# Patient Record
Sex: Female | Born: 1970
Health system: Southern US, Community
[De-identification: ages and names within clinical notes are randomized; demographics above are authoritative.]

## PROBLEM LIST (undated history)

## (undated) DIAGNOSIS — E079 Disorder of thyroid, unspecified: Secondary | ICD-10-CM

## (undated) DIAGNOSIS — D649 Anemia, unspecified: Secondary | ICD-10-CM

## (undated) DIAGNOSIS — F32A Depression, unspecified: Secondary | ICD-10-CM

## (undated) DIAGNOSIS — G43909 Migraine, unspecified, not intractable, without status migrainosus: Secondary | ICD-10-CM

## (undated) DIAGNOSIS — R000 Tachycardia, unspecified: Secondary | ICD-10-CM

## (undated) DIAGNOSIS — I319 Disease of pericardium, unspecified: Secondary | ICD-10-CM

## (undated) DIAGNOSIS — T7840XA Allergy, unspecified, initial encounter: Secondary | ICD-10-CM

## (undated) DIAGNOSIS — R002 Palpitations: Secondary | ICD-10-CM

## (undated) DIAGNOSIS — F419 Anxiety disorder, unspecified: Secondary | ICD-10-CM

## (undated) DIAGNOSIS — M1990 Unspecified osteoarthritis, unspecified site: Secondary | ICD-10-CM

## (undated) DIAGNOSIS — R519 Headache, unspecified: Secondary | ICD-10-CM

## (undated) DIAGNOSIS — E785 Hyperlipidemia, unspecified: Secondary | ICD-10-CM

## (undated) DIAGNOSIS — K219 Gastro-esophageal reflux disease without esophagitis: Secondary | ICD-10-CM

## (undated) DIAGNOSIS — R569 Unspecified convulsions: Secondary | ICD-10-CM

## (undated) DIAGNOSIS — G459 Transient cerebral ischemic attack, unspecified: Secondary | ICD-10-CM

## (undated) DIAGNOSIS — M797 Fibromyalgia: Secondary | ICD-10-CM

## (undated) DIAGNOSIS — IMO0002 Reserved for concepts with insufficient information to code with codable children: Secondary | ICD-10-CM

## (undated) DIAGNOSIS — R51 Headache: Secondary | ICD-10-CM

## (undated) HISTORY — PX: CHOLECYSTECTOMY: SHX55

## (undated) HISTORY — DX: Disorder of thyroid, unspecified: E07.9

## (undated) HISTORY — DX: Allergy, unspecified, initial encounter: T78.40XA

## (undated) HISTORY — DX: Hyperlipidemia, unspecified: E78.5

## (undated) HISTORY — DX: Unspecified convulsions: R56.9

## (undated) HISTORY — DX: Anemia, unspecified: D64.9

## (undated) HISTORY — PX: CHOLECYSTECTOMY, LAPAROSCOPIC: SHX56

## (undated) HISTORY — DX: Transient cerebral ischemic attack, unspecified: G45.9

## (undated) HISTORY — PX: UPPER GASTROINTESTINAL ENDOSCOPY: SHX188

## (undated) HISTORY — DX: Tachycardia, unspecified: R00.0

## (undated) HISTORY — DX: Unspecified osteoarthritis, unspecified site: M19.90

## (undated) HISTORY — DX: Reserved for concepts with insufficient information to code with codable children: IMO0002

## (undated) HISTORY — DX: Headache, unspecified: R51.9

## (undated) HISTORY — DX: Disease of pericardium, unspecified: I31.9

## (undated) HISTORY — PX: COLONOSCOPY: SHX174

## (undated) HISTORY — DX: Migraine, unspecified, not intractable, without status migrainosus: G43.909

## (undated) HISTORY — DX: Gastro-esophageal reflux disease without esophagitis: K21.9

## (undated) HISTORY — PX: ABDOMINAL HYSTERECTOMY: SHX81

## (undated) HISTORY — DX: Depression, unspecified: F32.A

## (undated) HISTORY — DX: Headache: R51

---

## 2012-12-26 DIAGNOSIS — I6330 Cerebral infarction due to thrombosis of unspecified cerebral artery: Secondary | ICD-10-CM | POA: Insufficient documentation

## 2012-12-26 DIAGNOSIS — M48061 Spinal stenosis, lumbar region without neurogenic claudication: Secondary | ICD-10-CM | POA: Insufficient documentation

## 2012-12-26 DIAGNOSIS — M47812 Spondylosis without myelopathy or radiculopathy, cervical region: Secondary | ICD-10-CM | POA: Insufficient documentation

## 2014-10-18 DIAGNOSIS — G451 Carotid artery syndrome (hemispheric): Secondary | ICD-10-CM | POA: Insufficient documentation

## 2014-12-12 ENCOUNTER — Encounter (HOSPITAL_COMMUNITY): Payer: Self-pay | Admitting: *Deleted

## 2014-12-12 ENCOUNTER — Emergency Department (HOSPITAL_COMMUNITY)
Admission: EM | Admit: 2014-12-12 | Discharge: 2014-12-13 | Disposition: A | Discharge status: Home / Self Care | Payer: Medicaid Other | Attending: Emergency Medicine | Admitting: Emergency Medicine

## 2014-12-12 ENCOUNTER — Emergency Department (HOSPITAL_COMMUNITY): Payer: Medicaid Other

## 2014-12-12 DIAGNOSIS — R200 Anesthesia of skin: Secondary | ICD-10-CM

## 2014-12-12 DIAGNOSIS — G8929 Other chronic pain: Secondary | ICD-10-CM | POA: Diagnosis not present

## 2014-12-12 DIAGNOSIS — R4781 Slurred speech: Secondary | ICD-10-CM | POA: Insufficient documentation

## 2014-12-12 DIAGNOSIS — Z8673 Personal history of transient ischemic attack (TIA), and cerebral infarction without residual deficits: Secondary | ICD-10-CM | POA: Insufficient documentation

## 2014-12-12 DIAGNOSIS — Z79899 Other long term (current) drug therapy: Secondary | ICD-10-CM | POA: Diagnosis not present

## 2014-12-12 DIAGNOSIS — R0602 Shortness of breath: Secondary | ICD-10-CM | POA: Insufficient documentation

## 2014-12-12 DIAGNOSIS — R52 Pain, unspecified: Secondary | ICD-10-CM

## 2014-12-12 DIAGNOSIS — M791 Myalgia: Secondary | ICD-10-CM | POA: Diagnosis not present

## 2014-12-12 DIAGNOSIS — F419 Anxiety disorder, unspecified: Secondary | ICD-10-CM | POA: Insufficient documentation

## 2014-12-12 HISTORY — DX: Fibromyalgia: M79.7

## 2014-12-12 HISTORY — DX: Anxiety disorder, unspecified: F41.9

## 2014-12-12 HISTORY — DX: Palpitations: R00.2

## 2014-12-12 LAB — BASIC METABOLIC PANEL
Anion gap: 6 (ref 5–15)
BUN: 10 mg/dL (ref 6–20)
CO2: 24 mmol/L (ref 22–32)
Calcium: 8.8 mg/dL — ABNORMAL LOW (ref 8.9–10.3)
Chloride: 107 mmol/L (ref 101–111)
Creatinine, Ser: 1.02 mg/dL — ABNORMAL HIGH (ref 0.44–1.00)
GFR calc Af Amer: >60 mL/min (ref >60)
GFR calc non Af Amer: >60 mL/min (ref >60)
Glucose, Bld: 90 mg/dL (ref 65–99)
Potassium: 4 mmol/L (ref 3.5–5.1)
Sodium: 137 mmol/L (ref 135–145)

## 2014-12-12 LAB — CBC
HCT: 37.8 % (ref 36.0–46.0)
Hemoglobin: 12.4 g/dL (ref 12.0–15.0)
MCH: 29 pg (ref 26.0–34.0)
MCHC: 32.8 g/dL (ref 30.0–36.0)
MCV: 88.3 fL (ref 78.0–100.0)
Platelets: 184 10*3/uL (ref 150–400)
RBC: 4.28 MIL/uL (ref 3.87–5.11)
RDW: 13.4 % (ref 11.5–15.5)
WBC: 3.9 10*3/uL — ABNORMAL LOW (ref 4.0–10.5)

## 2014-12-12 LAB — I-STAT TROPONIN, ED: Troponin i, poc: 0 ng/mL (ref 0.00–0.08)

## 2014-12-12 LAB — RAPID URINE DRUG SCREEN, HOSP PERFORMED
Amphetamines: NOT DETECTED
Barbiturates: NOT DETECTED
Benzodiazepines: NOT DETECTED
Cocaine: NOT DETECTED
Opiates: POSITIVE — AB
Tetrahydrocannabinol: NOT DETECTED

## 2014-12-12 MED ORDER — LORAZEPAM 2 MG/ML IJ SOLN
1.0000 mg | Freq: Once | INTRAMUSCULAR | Status: AC
Start: 2014-12-12 — End: 2014-12-12
  Administered 2014-12-12: 1 mg via INTRAVENOUS
  Filled 2014-12-12: qty 1

## 2014-12-12 NOTE — ED Provider Notes (Signed)
CSN: [id number]     Arrival date & time 12/12/14  41 History   First MD Initiated Contact with Patient 12/12/14 1736     Chief Complaint  Patient presents with  . Numbness  . Generalized Body Aches     (Consider location/radiation/quality/duration/timing/severity/associated sxs/prior Treatment) HPI Comments: 44 year old female reports numbness today. She says she woke up and had some right-sided facial numbness. It's coming and going lasting for 15 minutes at a time. Will leave for up to an hour. No instigating factors. Now she reports it's better on the right but is having some intermittent left-sided facial numbness. In route to the hospital, she started having some stuttering speech. No aphasia.  Does have history of TIA 2 years ago, fibromyalgia. She has not taken her medications today.  Patient is a 44 y.o. female presenting with neurologic complaint. The history is provided by the patient.  Neurologic Problem This is a new problem. The current episode started 6 to 12 hours ago. The problem occurs constantly. The problem has not changed since onset.Associated symptoms include shortness of breath. Pertinent negatives include no chest pain and no abdominal pain. Nothing aggravates the symptoms. Nothing relieves the symptoms. She has tried nothing for the symptoms.    Past Medical History  Diagnosis Date  . Fibromyalgia   . Palpitations   . Anxiety    Past Surgical History  Procedure Laterality Date  . Abdominal hysterectomy     History reviewed. No pertinent family history. History  Substance Use Topics  . Smoking status: Never Smoker   . Smokeless tobacco: Never Used  . Alcohol Use: Yes   OB History    No data available     Review of Systems  Respiratory: Positive for shortness of breath.   Cardiovascular: Negative for chest pain.  Gastrointestinal: Negative for vomiting and abdominal pain.  All other systems reviewed and are negative.     Allergies   Codeine  Home Medications   Prior to Admission medications   Medication Sig Start Date End Date Taking? Authorizing Provider  ALPRAZolam Duanne Moron) 0.5 MG tablet Take 0.5 mg by mouth every 6 (six) hours as needed for anxiety.   Yes Historical Provider, MD  clopidogrel (PLAVIX) 75 MG tablet Take 75 mg by mouth at bedtime.   Yes Historical Provider, MD  Cranberry-Vitamin C-Probiotic (AZO CRANBERRY PO) Take 2 capsules by mouth daily.   Yes Historical Provider, MD  Cyanocobalamin (VITAMIN B-12 IJ) Inject 1,000 mcg as directed every 30 (thirty) days. Take on the first of every month   Yes Historical Provider, MD  ergocalciferol (VITAMIN D2) 50000 UNITS capsule Take 50,000 Units by mouth once a week. Take on Tuesdays and Fridays per patient   Yes Historical Provider, MD  FLUoxetine (PROZAC) 40 MG capsule Take 40 mg by mouth at bedtime.   Yes Historical Provider, MD  Homeopathic Products (AZO YEAST PLUS PO) Take 1-2 capsules by mouth daily.   Yes Historical Provider, MD  HYDROcodone-acetaminophen (NORCO/VICODIN) 5-325 MG per tablet Take 1 tablet by mouth every 6 (six) hours as needed for moderate pain.   Yes Historical Provider, MD  metoprolol (LOPRESSOR) 50 MG tablet Take 25 mg by mouth 2 (two) times daily.   Yes Historical Provider, MD  Multiple Vitamin (MULTIVITAMIN WITH MINERALS) TABS tablet Take 1 tablet by mouth daily.   Yes Historical Provider, MD  pregabalin (LYRICA) 200 MG capsule Take 200 mg by mouth 2 (two) times daily.   Yes Historical Provider, MD  promethazine (PHENERGAN)  25 MG tablet Take 25 mg by mouth every 6 (six) hours as needed for nausea or vomiting.   Yes Historical Provider, MD  sennosides-docusate sodium (SENOKOT-S) 8.6-50 MG tablet Take 12 tablets by mouth daily as needed for constipation.   Yes Historical Provider, MD  SUMAtriptan (IMITREX) 100 MG tablet Take 100 mg by mouth every 2 (two) hours as needed for migraine. May repeat in 2 hours if headache persists or recurs.   Yes  Historical Provider, MD  topiramate (TOPAMAX) 50 MG tablet Take 50 mg by mouth 2 (two) times daily.   Yes Historical Provider, MD  traMADol (ULTRAM) 50 MG tablet Take 50 mg by mouth 4 (four) times daily. Take every day per patient   Yes Historical Provider, MD  traZODone (DESYREL) 50 MG tablet Take 50-150 mg by mouth at bedtime as needed for sleep.   Yes Historical Provider, MD   BP 100/63 mmHg  Pulse 70  Temp(Src) 98 F (36.7 C) (Oral)  Resp 15  SpO2 100% Physical Exam  Constitutional: She is oriented to person, place, and time. She appears well-developed and well-nourished. No distress.  HENT:  Head: Normocephalic and atraumatic.  Mouth/Throat: Oropharynx is clear and moist.  Eyes: EOM are normal. Pupils are equal, round, and reactive to light.  Neck: Normal range of motion. Neck supple.  Cardiovascular: Normal rate and regular rhythm.  Exam reveals no friction rub.   No murmur heard. Pulmonary/Chest: Effort normal and breath sounds normal. No respiratory distress. She has no wheezes. She has no rales.  Abdominal: Soft. She exhibits no distension. There is no tenderness. There is no rebound.  Musculoskeletal: Normal range of motion. She exhibits no edema.  Neurological: She is alert and oriented to person, place, and time. No cranial nerve deficit or sensory deficit. She exhibits normal muscle tone. GCS eye subscore is 4. GCS verbal subscore is 5. GCS motor subscore is 6.  No aphasia Has mild L facial weakness, no droop. When trying to smile, only R side of face twitches. Mild altered light touch to L face  Skin: Skin is warm. No rash noted. She is not diaphoretic.  Nursing note and vitals reviewed.   ED Course  Procedures (including critical care time) Labs Review Labs Reviewed  CBC - Abnormal; Notable for the following:    WBC 3.9 (*)    All other components within normal limits  BASIC METABOLIC PANEL - Abnormal; Notable for the following:    Creatinine, Ser 1.02 (*)     Calcium 8.8 (*)    All other components within normal limits  URINE RAPID DRUG SCREEN (HOSP PERFORMED) - Abnormal; Notable for the following:    Opiates POSITIVE (*)    All other components within normal limits  I-STAT TROPOININ, ED    Imaging Review Ct Head Wo Contrast  12/12/2014   CLINICAL DATA:  Acute onset of facial numbness and generalized body pain. Initial encounter.  EXAM: CT HEAD WITHOUT CONTRAST  TECHNIQUE: Contiguous axial images were obtained from the base of the skull through the vertex without intravenous contrast.  COMPARISON:  None.  FINDINGS: There is no evidence of acute infarction, mass lesion, or intra- or extra-axial hemorrhage on CT.  The posterior fossa, including the cerebellum, brainstem and fourth ventricle, is within normal limits. The third and lateral ventricles, and basal ganglia are unremarkable in appearance. The cerebral hemispheres are symmetric in appearance, with normal gray-white differentiation. No mass effect or midline shift is seen.  There is no evidence  of fracture; visualized osseous structures are unremarkable in appearance. The orbits are within normal limits. The paranasal sinuses and mastoid air cells are well-aerated. No significant soft tissue abnormalities are seen.  IMPRESSION: Unremarkable noncontrast CT of the head.   Electronically Signed   By: Garald Balding M.D.   On: 12/12/2014 18:22   Mr Brain Wo Contrast  12/12/2014   CLINICAL DATA:  Facial numbness beginning at 10 this morning. Diffuse body pain. History of fibromyalgia CF.  EXAM: MRI HEAD WITHOUT CONTRAST  TECHNIQUE: Multiplanar, multiecho pulse sequences of the brain and surrounding structures were obtained without intravenous contrast.  COMPARISON:  CT of the head Dec 12, 2014 at 1815 hours  FINDINGS: The ventricles and sulci are normal for patient's age. No abnormal parenchymal signal, mass lesions, mass effect. No reduced diffusion to suggest acute ischemia nor hyperacute demyelination.  No susceptibility artifact to suggest hemorrhage. A few scattered subcentimeter supratentorial and pontine white matter FLAIR T2 hyperintensities, mildly advanced for age. Remote bilateral small cerebellar infarcts.  No abnormal extra-axial fluid collections. No extra-axial masses though, contrast enhanced sequences would be more sensitive. Diminutive vertebral artery and basilar artery flow void ; bilateral posterior communicating artery flow voids noted.  Ocular globes and orbital contents are unremarkable though not tailored for evaluation. No abnormal sellar expansion. Visualized paranasal sinuses and mastoid air cells are well-aerated. No suspicious calvarial bone marrow signal. No abnormal sellar expansion. Craniocervical junction maintained.  IMPRESSION: No acute intracranial process.  Multiple small LEFT greater the RIGHT old cerebellar infarcts. Diminutive posterior circulation, likely on congenital basis. This could be further characterized on MR versus CT angiography.  Mild white matter changes may reflect chronic small vessel ischemic disease, advanced for age.   Electronically Signed   By: Elon Alas   On: 12/12/2014 23:39     EKG Interpretation   Date/Time:  Sunday Dec 12 2014 18:02:13 EDT Ventricular Rate:  63 PR Interval:  129 QRS Duration: 70 QT Interval:  403 QTC Calculation: 412 R Axis:   26 Text Interpretation:  Sinus rhythm Abnormal R-wave progression, early  transition No prior for comparison Confirmed by Mingo Amber  MD, Kamelia Lampkins ([id number])  on 12/12/2014 6:18:39 PM      MDM   Final diagnoses:  Facial numbness  Body aches    44 year old female here with facial numbness and weakness. She also reports diffuse pain throughout her entire body. Facial numbness has been right and left-sided, intermittent. Initially began as right-sided and is now left-sided. She has history of fibromyalgia that causes chronic body pain. Here vitals are stable. She did report this began when she  woke up and has had intermittent symptoms and symptoms that are patchy and changing signs. I do not feel she meets code stroke criteria. On exam she has lot altered light touch the left side of her face and when smiling she doesn't move the left side of her face. There is no baseline facial droop. Will plan on CT and MRI of her head. Labs are unremarkable. EKG is unremarkable. MRI is normal. Patient's feeling better. Instructed to follow-up with PCP and follow-up with her rheumatologist.    Evelina Bucy, MD 12/13/14 [phone]

## 2014-12-12 NOTE — ED Notes (Signed)
MD Mingo Amber at the bedside and made aware of the patient's exam.

## 2014-12-12 NOTE — ED Notes (Addendum)
Pt brought in by family for c/o facial numbness starting at 1000 this morning. Pt very tearful in triage, states her entire body hurts, no facial droop noted, pt able to get out of wheelchair into triage chair unassisted. Pt presents with a mild stutter per the family is new. Stutter started upon entering the ED at check in. Pt has bilateral grips, no weakness noted.

## 2014-12-12 NOTE — ED Notes (Signed)
Phlebotomy at the bedside  

## 2014-12-12 NOTE — ED Notes (Signed)
Patient transported to MRI 

## 2014-12-12 NOTE — ED Notes (Signed)
MD Walden at the bedside. 

## 2014-12-13 NOTE — Discharge Instructions (Signed)
Paresthesia Paresthesia is an abnormal burning or prickling sensation. This sensation is generally felt in the hands, arms, legs, or feet. However, it may occur in any part of the body. It is usually not painful. The feeling may be described as:  Tingling or numbness.  "Pins and needles."  Skin crawling.  Buzzing.  Limbs "falling asleep."  Itching. Most people experience temporary (transient) paresthesia at some time in their lives. CAUSES  Paresthesia may occur when you breathe too quickly (hyperventilation). It can also occur without any apparent cause. Commonly, paresthesia occurs when pressure is placed on a nerve. The feeling quickly goes away once the pressure is removed. For some people, however, paresthesia is a long-lasting (chronic) condition caused by an underlying disorder. The underlying disorder may be:  A traumatic, direct injury to nerves. Examples include a:  Broken (fractured) neck.  Fractured skull.  A disorder affecting the brain and spinal cord (central nervous system). Examples include:  Transverse myelitis.  Encephalitis.  Transient ischemic attack.  Multiple sclerosis.  Stroke.  Tumor or blood vessel problems, such as an arteriovenous malformation pressing against the brain or spinal cord.  A condition that damages the peripheral nerves (peripheral neuropathy). Peripheral nerves are not part of the brain and spinal cord. These conditions include:  Diabetes.  Peripheral vascular disease.  Nerve entrapment syndromes, such as carpal tunnel syndrome.  Shingles.  Hypothyroidism.  Vitamin B12 deficiencies.  Alcoholism.  Heavy metal poisoning (lead, arsenic).  Rheumatoid arthritis.  Systemic lupus erythematosus. DIAGNOSIS  Your caregiver will attempt to find the underlying cause of your paresthesia. Your caregiver may:  Take your medical history.  Perform a physical exam.  Order various lab tests.  Order imaging tests. TREATMENT    Treatment for paresthesia depends on the underlying cause. HOME CARE INSTRUCTIONS  Avoid drinking alcohol.  You may consider massage or acupuncture to help relieve your symptoms.  Keep all follow-up appointments as directed by your caregiver. SEEK IMMEDIATE MEDICAL CARE IF:   You feel weak.  You have trouble walking or moving.  You have problems with speech or vision.  You feel confused.  You cannot control your bladder or bowel movements.  You feel numbness after an injury.  You faint.  Your burning or prickling feeling gets worse when walking.  You have pain, cramps, or dizziness.  You develop a rash. MAKE SURE YOU:  Understand these instructions.  Will watch your condition.  Will get help right away if you are not doing well or get worse. Document Released: 07/06/2002 Document Revised: 10/08/2011 Document Reviewed: 04/06/2011 [facility] Patient Information 2015 [place], [place]. This information is not intended to replace advice given to you by your health care provider. Make sure you discuss any questions you have with your health care provider.   Emergency Department Resource Guide 1) Find a Doctor and Pay Out of Pocket Although you won't have to find out who is covered by your insurance plan, it is a good idea to ask around and get recommendations. You will then need to call the office and see if the doctor you have chosen will accept you as a new patient and what types of options they offer for patients who are self-pay. Some doctors offer discounts or will set up payment plans for their patients who do not have insurance, but you will need to ask so you aren't surprised when you get to your appointment.  2) Contact Your Local Health Department Not all health departments have doctors that can see  patients for sick visits, but many do, so it is worth a call to see if yours does. If you don't know where your local health department is, you can check in your phone  book. The CDC also has a tool to help you locate your state's health department, and many state websites also have listings of all of their local health departments.  3) Find a [facility] If your illness is not likely to be very severe or complicated, you may want to try a walk in clinic. These are popping up all over the country in pharmacies, drugstores, and shopping centers. They're usually staffed by nurse practitioners or physician assistants that have been trained to treat common illnesses and complaints. They're usually fairly quick and inexpensive. However, if you have serious medical issues or chronic medical problems, these are probably not your best option.  No Primary Care Doctor: - Call Health Connect at  [phone] - they can help you locate a primary care doctor that  accepts your insurance, provides certain services, etc. - Physician Referral Service- [phone]  Chronic Pain Problems: Organization         Address  Phone   Notes  [facility]  [phone] Patients need to be referred by their primary care doctor.   Medication Assistance: Organization         Address  Phone   Notes  [facility] Medication [facility] [address] [place], [place] [address] [phone] --Must be a resident of [facility] -- Must have NO insurance coverage whatsoever (no Medicaid/ Medicare, etc.) -- The pt. MUST have a primary care doctor that directs their care regularly and follows them in the community   Med[facility]ist  [phone]   [employer]  [phone]    Agencies that provide inexpensive medical care: Organization         Address  Phone   Notes  [place]  [phone]   [facility]    [phone]   [facility] [place], [place] [address] [phone]   [place] [address], [place] [phone]   Planned  P[facility]thood    [phone]   [facility]    [phone]   [place] and [place] [address], [place] Phone:  [phone], Fax:  [phone] Hours of Operation:  9 am - 6 pm, M-F.  Also accepts Medicaid/Medicare and self-pay.  [facility] for [place] [address], [address], [place] Phone: [phone], Fax: [phone]. Hours of Operation:  8:30 am - 5:30 pm, M-F.  Also accepts Medicaid and self-pay.  [facility] [place] [address], [place] Phone: [phone]   [place], [place], [place] [phone], Ext. [phone] Mondays & Thursdays: 7-9 AM.  First 15 patients are seen on a first come, first serve basis.    [place] Providers:  Organization         Address  Phone   Notes  [facility] [address], [place] [phone] Also accepts self-pay patients.  [place], [place]  [phone]   [place], [address], [place] [phone]   [place] [address] [place] (  [place]) [place] [address], [place]   [phone] Only accepts [place] Access [place] patients after they have their name applied to their card.   Self-Pay (no insurance) in [facility]:  Organization         Address  Phone   Notes  Sickle Cell Patients, [facility] [place] [phone]   [facility] [place] [phone]   [facility] [place]  [place], [place], [place] [phone]   [facility]/Dr. Osei-Bonsu  [address] [place] or [address], [place] [phone] Phone number for both [place] and [place] locations is the same.   Urgent Medical and [facility] [address], [place] [phone]   [facility] [address], [place] or [address] [phone]   [facility] [address], [place] [phone], phone; [phone], fax Sees patients 1st and 3rd Saturday of every month.  Must not qualify for public or private insurance (i.e. Medicaid, Medicare, [place] Health Choice, Veterans' Benefits)  Household income should be no more than 200% of the poverty level The clinic cannot treat you if you are pregnant or think you are pregnant  Sexually transmitted diseases are not treated at the clinic.    Dental Care: Organization         Address  Phone  Notes  [facility] Department of [facility] [place] [phone] Accepts children up to age 8 who are enrolled in [place] or [place]; pregnant women with a Medicaid card; and children who have applied for Medicaid or [place] Health Choice, but were declined, whose parents can pay a reduced fee at time of service.  [facility] Department of [facility]  [address], [place] [phone] Accepts children up to age 37 who are enrolled in [place] or [place]; pregnant women with a Medicaid card; and children who have applied for Medicaid or  Health Choice, but were declined, whose parents can pay a reduced fee at time of service.  [place] Adult Dental Access PROGRAM  [place] [phone] Patients are seen by appointment only. Walk-ins are not accepted. [place] will see patients 52 years of age and older. Monday - Tuesday (8am-5pm) Most Wednesdays (8:30-5pm) $30 per visit, cash only  [facility] Adult Dental Access PROGRAM  [address], [facility] [phone] Patients are seen by appointment only. Walk-ins are not accepted. [place] will see patients 36  years of age and older. One Wednesday Evening (Monthly: Volunteer Based).  $30 per visit, cash only  [place]  [phone] for adults; Children under age 73, call Graduate Pediatric Dentistry at [phone]. Children aged 59-14, please call [phone] to request a pediatric application.  Dental services are provided in all areas of dental care including fillings, crowns and bridges, complete and partial dentures, implants, gum treatment, root canals, and extractions. Preventive care is also provided. Treatment is provided to both adults and children. Patients are selected via a lottery and there is often a waiting list.   [facility] [address], [place]  [phone] [email or web address], [place]  [place], [place] [phone], Ext. [phone] Second and Fourth Thursday of each month, opens at 6:30 AM; Clinic ends at 9 AM.  Patients are seen on a first-come first-served basis, and a limited number are seen during each clinic.   [facility]  [address] Hillard Danker [place], [place] [phone]   Eligibility Requirements You must have lived in [place], [place], or [place] counties for at least the last three months.   You cannot be eligible for state or federal sponsored [employer], including [employer], [place], or [employer].   You generally cannot be eligible for healthcare insurance through your employer.    How to apply: Eligibility screenings are held every Tuesday and Wednesday afternoon from 1:00 pm until 4:00 pm. You do not need an appointment for the interview!  [facility] [address] [place], [place]   [place]  [place] Department  [place]  [phone]    Behavioral Health Resources in the Community: Intensive Outpatient  Programs Organization         Address  Phone  Notes  Sekiu Archer. [address], [place], [place] [phone]   [facility] [address] [place], [place]   ADS: Alcohol & Drug Svcs [address], [place], [place]   [place] [address],  [place], [place] or [phone]   Substance Abuse Resources Organization         Address  Phone  Notes  Alcohol and Drug Services  [phone]   [place]  [phone]   The [place]   Chinita Pester  [phone]   Residential & Outpatient Substance Abuse Program  [phone]   Psychological Services Organization         Address  Phone  Notes  [facility]  [place]  [phone]   [place] [address], [place] or [phone]    Mobile Crisis Teams Organization         Address  Phone  Notes  [facility], Mobile Crisis Care Unit  [phone]   [facility]  [address] [place], [place]   [place] [address], [place] [phone]    Self-Help/Support Groups Organization         Address  Phone             Notes  [place] of [place] - variety of support groups  [place] Call for more information  Narcotics Anonymous (NA), [facility] [address], [employer] [place]  2 meetings at this location   Special educational needs teacher         Address  Phone  Notes  [facility] Residential Treatment [place],    [place]  [phone]   [facility] [employer]  [address] [place] [address], [place], [place]   [place], [place] [phone] Admissions: 8am-3pm M-F  Incentives Substance [place] [address]    [place], [place]  [phone]   [facility] [address] Jadene Pierini [place], [place]   The [place].,  [place], [place] - Intensive Outpatient [address] Kristeen Mans [place], [place], [place]  [facility] (Addiction Recovery Care Assoc.) [place].,  [place], [place] [phone] or [phone]   [facility]idential Treatment Services (RTS) [address], [place], [place] Accepts Medicaid  [place] [address],  [place] [phone] Substance Abuse/Addiction Treatment   [facility] Organization         Address  Phone  Notes  [facility]  [phone]   Domenic Schwab, PhD [address] Arlis Porta [place], [place]   [phone] or [phone]   [place], [place] [phone]   [place] [address], [place], [place] [phone] Insurance/Medicaid/sponsorship through [facility] and Families [address], [address]                                    [place], [place] [phone] [place] [address], [place] [phone]    Dr. Adele Schilder  [phone]   [facility] Dept. 1) [address], [place] 2) [place] 3)  [address], [place] [phone]  [phone]   [place] [phone] or [phone] (After Hours)

## 2014-12-13 NOTE — ED Notes (Signed)
Pt. Left with all belongings 

## 2014-12-25 NOTE — ED Notes (Signed)
Wasted Ativan 1mg /.57ml with Nicolette Bang RN.

## 2015-11-10 DIAGNOSIS — L299 Pruritus, unspecified: Secondary | ICD-10-CM | POA: Diagnosis not present

## 2015-11-10 DIAGNOSIS — R202 Paresthesia of skin: Secondary | ICD-10-CM | POA: Diagnosis not present

## 2015-11-18 DIAGNOSIS — R5381 Other malaise: Secondary | ICD-10-CM | POA: Diagnosis not present

## 2015-11-28 DIAGNOSIS — M797 Fibromyalgia: Secondary | ICD-10-CM | POA: Insufficient documentation

## 2015-11-28 DIAGNOSIS — L299 Pruritus, unspecified: Secondary | ICD-10-CM | POA: Diagnosis not present

## 2015-12-15 DIAGNOSIS — L299 Pruritus, unspecified: Secondary | ICD-10-CM | POA: Diagnosis not present

## 2015-12-20 DIAGNOSIS — Z79899 Other long term (current) drug therapy: Secondary | ICD-10-CM | POA: Diagnosis not present

## 2015-12-20 DIAGNOSIS — R0789 Other chest pain: Secondary | ICD-10-CM | POA: Diagnosis not present

## 2015-12-20 DIAGNOSIS — R079 Chest pain, unspecified: Secondary | ICD-10-CM | POA: Diagnosis not present

## 2015-12-20 DIAGNOSIS — F329 Major depressive disorder, single episode, unspecified: Secondary | ICD-10-CM | POA: Diagnosis not present

## 2015-12-20 DIAGNOSIS — G43909 Migraine, unspecified, not intractable, without status migrainosus: Secondary | ICD-10-CM | POA: Diagnosis not present

## 2015-12-20 DIAGNOSIS — E039 Hypothyroidism, unspecified: Secondary | ICD-10-CM | POA: Diagnosis not present

## 2015-12-20 DIAGNOSIS — R4182 Altered mental status, unspecified: Secondary | ICD-10-CM | POA: Diagnosis not present

## 2015-12-20 DIAGNOSIS — F419 Anxiety disorder, unspecified: Secondary | ICD-10-CM | POA: Diagnosis not present

## 2015-12-20 DIAGNOSIS — R072 Precordial pain: Secondary | ICD-10-CM | POA: Diagnosis not present

## 2015-12-20 DIAGNOSIS — K219 Gastro-esophageal reflux disease without esophagitis: Secondary | ICD-10-CM | POA: Diagnosis not present

## 2015-12-20 DIAGNOSIS — R0602 Shortness of breath: Secondary | ICD-10-CM | POA: Diagnosis not present

## 2015-12-21 DIAGNOSIS — I6330 Cerebral infarction due to thrombosis of unspecified cerebral artery: Secondary | ICD-10-CM | POA: Diagnosis not present

## 2015-12-21 DIAGNOSIS — R51 Headache: Secondary | ICD-10-CM | POA: Diagnosis not present

## 2015-12-21 DIAGNOSIS — R4701 Aphasia: Secondary | ICD-10-CM | POA: Diagnosis not present

## 2015-12-22 DIAGNOSIS — R42 Dizziness and giddiness: Secondary | ICD-10-CM | POA: Diagnosis not present

## 2015-12-22 DIAGNOSIS — R251 Tremor, unspecified: Secondary | ICD-10-CM | POA: Diagnosis not present

## 2015-12-22 DIAGNOSIS — Z79899 Other long term (current) drug therapy: Secondary | ICD-10-CM | POA: Diagnosis not present

## 2015-12-22 DIAGNOSIS — Z90710 Acquired absence of both cervix and uterus: Secondary | ICD-10-CM | POA: Diagnosis not present

## 2015-12-22 DIAGNOSIS — R253 Fasciculation: Secondary | ICD-10-CM | POA: Diagnosis not present

## 2015-12-22 DIAGNOSIS — R569 Unspecified convulsions: Secondary | ICD-10-CM | POA: Diagnosis not present

## 2015-12-22 DIAGNOSIS — Z7902 Long term (current) use of antithrombotics/antiplatelets: Secondary | ICD-10-CM | POA: Diagnosis not present

## 2015-12-22 DIAGNOSIS — R4182 Altered mental status, unspecified: Secondary | ICD-10-CM | POA: Diagnosis not present

## 2015-12-22 DIAGNOSIS — R531 Weakness: Secondary | ICD-10-CM | POA: Diagnosis not present

## 2015-12-22 DIAGNOSIS — Z885 Allergy status to narcotic agent status: Secondary | ICD-10-CM | POA: Diagnosis not present

## 2015-12-22 DIAGNOSIS — R51 Headache: Secondary | ICD-10-CM | POA: Diagnosis not present

## 2015-12-22 DIAGNOSIS — K589 Irritable bowel syndrome without diarrhea: Secondary | ICD-10-CM | POA: Diagnosis not present

## 2015-12-22 DIAGNOSIS — R258 Other abnormal involuntary movements: Secondary | ICD-10-CM | POA: Diagnosis not present

## 2015-12-22 DIAGNOSIS — Z9049 Acquired absence of other specified parts of digestive tract: Secondary | ICD-10-CM | POA: Diagnosis not present

## 2015-12-22 DIAGNOSIS — R404 Transient alteration of awareness: Secondary | ICD-10-CM | POA: Diagnosis not present

## 2015-12-22 DIAGNOSIS — F329 Major depressive disorder, single episode, unspecified: Secondary | ICD-10-CM | POA: Diagnosis not present

## 2015-12-28 DIAGNOSIS — F419 Anxiety disorder, unspecified: Secondary | ICD-10-CM | POA: Insufficient documentation

## 2015-12-28 DIAGNOSIS — G4701 Insomnia due to medical condition: Secondary | ICD-10-CM | POA: Insufficient documentation

## 2015-12-28 DIAGNOSIS — R251 Tremor, unspecified: Secondary | ICD-10-CM | POA: Insufficient documentation

## 2015-12-28 DIAGNOSIS — M791 Myalgia: Secondary | ICD-10-CM | POA: Diagnosis not present

## 2015-12-28 DIAGNOSIS — G459 Transient cerebral ischemic attack, unspecified: Secondary | ICD-10-CM | POA: Insufficient documentation

## 2015-12-28 DIAGNOSIS — F411 Generalized anxiety disorder: Secondary | ICD-10-CM | POA: Insufficient documentation

## 2015-12-30 DIAGNOSIS — L700 Acne vulgaris: Secondary | ICD-10-CM | POA: Diagnosis not present

## 2015-12-30 DIAGNOSIS — L503 Dermatographic urticaria: Secondary | ICD-10-CM | POA: Diagnosis not present

## 2016-01-16 DIAGNOSIS — L500 Allergic urticaria: Secondary | ICD-10-CM | POA: Diagnosis not present

## 2016-01-16 DIAGNOSIS — L503 Dermatographic urticaria: Secondary | ICD-10-CM | POA: Diagnosis not present

## 2016-01-26 DIAGNOSIS — M797 Fibromyalgia: Secondary | ICD-10-CM | POA: Diagnosis not present

## 2016-01-26 DIAGNOSIS — F419 Anxiety disorder, unspecified: Secondary | ICD-10-CM | POA: Diagnosis not present

## 2016-01-26 DIAGNOSIS — R946 Abnormal results of thyroid function studies: Secondary | ICD-10-CM | POA: Diagnosis not present

## 2016-01-26 DIAGNOSIS — R0602 Shortness of breath: Secondary | ICD-10-CM | POA: Diagnosis not present

## 2016-02-02 DIAGNOSIS — J9811 Atelectasis: Secondary | ICD-10-CM | POA: Diagnosis not present

## 2016-02-02 DIAGNOSIS — R079 Chest pain, unspecified: Secondary | ICD-10-CM | POA: Diagnosis not present

## 2016-02-09 DIAGNOSIS — L299 Pruritus, unspecified: Secondary | ICD-10-CM | POA: Diagnosis not present

## 2016-02-09 DIAGNOSIS — R21 Rash and other nonspecific skin eruption: Secondary | ICD-10-CM | POA: Diagnosis not present

## 2016-02-09 DIAGNOSIS — M797 Fibromyalgia: Secondary | ICD-10-CM | POA: Diagnosis not present

## 2016-02-09 DIAGNOSIS — F419 Anxiety disorder, unspecified: Secondary | ICD-10-CM | POA: Diagnosis not present

## 2016-02-27 DIAGNOSIS — F419 Anxiety disorder, unspecified: Secondary | ICD-10-CM | POA: Diagnosis not present

## 2016-02-27 DIAGNOSIS — E559 Vitamin D deficiency, unspecified: Secondary | ICD-10-CM | POA: Insufficient documentation

## 2016-02-27 DIAGNOSIS — R29898 Other symptoms and signs involving the musculoskeletal system: Secondary | ICD-10-CM | POA: Diagnosis not present

## 2016-02-27 DIAGNOSIS — M797 Fibromyalgia: Secondary | ICD-10-CM | POA: Diagnosis not present

## 2016-02-27 DIAGNOSIS — M47812 Spondylosis without myelopathy or radiculopathy, cervical region: Secondary | ICD-10-CM | POA: Diagnosis not present

## 2016-02-27 DIAGNOSIS — M542 Cervicalgia: Secondary | ICD-10-CM | POA: Diagnosis not present

## 2016-02-27 DIAGNOSIS — E538 Deficiency of other specified B group vitamins: Secondary | ICD-10-CM | POA: Diagnosis not present

## 2016-03-17 DIAGNOSIS — M797 Fibromyalgia: Secondary | ICD-10-CM | POA: Diagnosis not present

## 2016-03-17 DIAGNOSIS — M2550 Pain in unspecified joint: Secondary | ICD-10-CM | POA: Diagnosis not present

## 2016-03-26 DIAGNOSIS — M609 Myositis, unspecified: Secondary | ICD-10-CM | POA: Diagnosis not present

## 2016-03-26 DIAGNOSIS — R5383 Other fatigue: Secondary | ICD-10-CM | POA: Diagnosis not present

## 2016-03-26 DIAGNOSIS — R0683 Snoring: Secondary | ICD-10-CM | POA: Diagnosis not present

## 2016-03-26 DIAGNOSIS — Z6836 Body mass index (BMI) 36.0-36.9, adult: Secondary | ICD-10-CM | POA: Diagnosis not present

## 2016-05-16 DIAGNOSIS — R1031 Right lower quadrant pain: Secondary | ICD-10-CM | POA: Diagnosis not present

## 2016-05-17 DIAGNOSIS — R1031 Right lower quadrant pain: Secondary | ICD-10-CM | POA: Diagnosis not present

## 2016-06-08 DIAGNOSIS — F450 Somatization disorder: Secondary | ICD-10-CM | POA: Insufficient documentation

## 2016-06-08 DIAGNOSIS — F445 Conversion disorder with seizures or convulsions: Secondary | ICD-10-CM | POA: Insufficient documentation

## 2016-06-08 DIAGNOSIS — M47812 Spondylosis without myelopathy or radiculopathy, cervical region: Secondary | ICD-10-CM | POA: Diagnosis not present

## 2016-06-08 DIAGNOSIS — Z8673 Personal history of transient ischemic attack (TIA), and cerebral infarction without residual deficits: Secondary | ICD-10-CM | POA: Diagnosis not present

## 2016-06-08 DIAGNOSIS — G4701 Insomnia due to medical condition: Secondary | ICD-10-CM | POA: Diagnosis not present

## 2016-06-08 DIAGNOSIS — R51 Headache: Secondary | ICD-10-CM | POA: Diagnosis not present

## 2016-06-08 DIAGNOSIS — R000 Tachycardia, unspecified: Secondary | ICD-10-CM | POA: Diagnosis not present

## 2016-06-08 DIAGNOSIS — Z885 Allergy status to narcotic agent status: Secondary | ICD-10-CM | POA: Diagnosis not present

## 2016-06-08 DIAGNOSIS — F419 Anxiety disorder, unspecified: Secondary | ICD-10-CM | POA: Diagnosis not present

## 2016-06-08 DIAGNOSIS — R569 Unspecified convulsions: Secondary | ICD-10-CM | POA: Diagnosis not present

## 2016-06-08 DIAGNOSIS — F329 Major depressive disorder, single episode, unspecified: Secondary | ICD-10-CM | POA: Diagnosis not present

## 2016-06-08 DIAGNOSIS — G43809 Other migraine, not intractable, without status migrainosus: Secondary | ICD-10-CM | POA: Diagnosis not present

## 2016-06-08 DIAGNOSIS — M48061 Spinal stenosis, lumbar region without neurogenic claudication: Secondary | ICD-10-CM | POA: Diagnosis not present

## 2016-06-08 DIAGNOSIS — F332 Major depressive disorder, recurrent severe without psychotic features: Secondary | ICD-10-CM | POA: Diagnosis not present

## 2016-06-08 DIAGNOSIS — I6330 Cerebral infarction due to thrombosis of unspecified cerebral artery: Secondary | ICD-10-CM | POA: Diagnosis not present

## 2016-06-08 DIAGNOSIS — G451 Carotid artery syndrome (hemispheric): Secondary | ICD-10-CM | POA: Diagnosis not present

## 2016-06-08 DIAGNOSIS — M791 Myalgia: Secondary | ICD-10-CM | POA: Diagnosis not present

## 2016-06-11 DIAGNOSIS — R42 Dizziness and giddiness: Secondary | ICD-10-CM | POA: Diagnosis not present

## 2016-06-13 DIAGNOSIS — F329 Major depressive disorder, single episode, unspecified: Secondary | ICD-10-CM | POA: Insufficient documentation

## 2016-06-13 DIAGNOSIS — R251 Tremor, unspecified: Secondary | ICD-10-CM | POA: Diagnosis not present

## 2016-06-13 DIAGNOSIS — F450 Somatization disorder: Secondary | ICD-10-CM | POA: Diagnosis not present

## 2016-06-13 DIAGNOSIS — F419 Anxiety disorder, unspecified: Secondary | ICD-10-CM | POA: Diagnosis not present

## 2016-06-13 DIAGNOSIS — F32A Depression, unspecified: Secondary | ICD-10-CM | POA: Insufficient documentation

## 2016-06-13 DIAGNOSIS — G43909 Migraine, unspecified, not intractable, without status migrainosus: Secondary | ICD-10-CM | POA: Insufficient documentation

## 2016-06-16 DIAGNOSIS — R251 Tremor, unspecified: Secondary | ICD-10-CM | POA: Diagnosis not present

## 2016-06-16 DIAGNOSIS — R51 Headache: Secondary | ICD-10-CM | POA: Diagnosis not present

## 2016-06-18 DIAGNOSIS — R0789 Other chest pain: Secondary | ICD-10-CM | POA: Diagnosis not present

## 2016-06-18 DIAGNOSIS — R002 Palpitations: Secondary | ICD-10-CM | POA: Insufficient documentation

## 2016-06-18 DIAGNOSIS — R55 Syncope and collapse: Secondary | ICD-10-CM | POA: Insufficient documentation

## 2016-06-18 DIAGNOSIS — Z6834 Body mass index (BMI) 34.0-34.9, adult: Secondary | ICD-10-CM | POA: Diagnosis not present

## 2016-06-23 DIAGNOSIS — Z0100 Encounter for examination of eyes and vision without abnormal findings: Secondary | ICD-10-CM | POA: Diagnosis not present

## 2016-06-25 DIAGNOSIS — F339 Major depressive disorder, recurrent, unspecified: Secondary | ICD-10-CM | POA: Diagnosis not present

## 2016-06-25 DIAGNOSIS — R55 Syncope and collapse: Secondary | ICD-10-CM | POA: Diagnosis not present

## 2016-06-25 DIAGNOSIS — Z6835 Body mass index (BMI) 35.0-35.9, adult: Secondary | ICD-10-CM | POA: Diagnosis not present

## 2016-06-27 DIAGNOSIS — R251 Tremor, unspecified: Secondary | ICD-10-CM | POA: Diagnosis not present

## 2016-06-27 DIAGNOSIS — F450 Somatization disorder: Secondary | ICD-10-CM | POA: Diagnosis not present

## 2016-06-27 DIAGNOSIS — F445 Conversion disorder with seizures or convulsions: Secondary | ICD-10-CM | POA: Diagnosis not present

## 2016-06-27 DIAGNOSIS — F329 Major depressive disorder, single episode, unspecified: Secondary | ICD-10-CM | POA: Diagnosis not present

## 2016-07-04 DIAGNOSIS — R0789 Other chest pain: Secondary | ICD-10-CM | POA: Diagnosis not present

## 2016-07-04 DIAGNOSIS — R55 Syncope and collapse: Secondary | ICD-10-CM | POA: Diagnosis not present

## 2016-07-04 DIAGNOSIS — R002 Palpitations: Secondary | ICD-10-CM | POA: Diagnosis not present

## 2016-07-09 DIAGNOSIS — F329 Major depressive disorder, single episode, unspecified: Secondary | ICD-10-CM | POA: Diagnosis not present

## 2016-07-09 DIAGNOSIS — F419 Anxiety disorder, unspecified: Secondary | ICD-10-CM | POA: Diagnosis not present

## 2016-07-30 DIAGNOSIS — R300 Dysuria: Secondary | ICD-10-CM | POA: Diagnosis not present

## 2016-07-30 DIAGNOSIS — N3090 Cystitis, unspecified without hematuria: Secondary | ICD-10-CM | POA: Diagnosis not present

## 2016-07-31 DIAGNOSIS — R109 Unspecified abdominal pain: Secondary | ICD-10-CM | POA: Diagnosis not present

## 2016-07-31 DIAGNOSIS — Z6831 Body mass index (BMI) 31.0-31.9, adult: Secondary | ICD-10-CM | POA: Diagnosis not present

## 2016-07-31 DIAGNOSIS — B373 Candidiasis of vulva and vagina: Secondary | ICD-10-CM | POA: Diagnosis not present

## 2016-08-01 DIAGNOSIS — R1032 Left lower quadrant pain: Secondary | ICD-10-CM | POA: Diagnosis not present

## 2016-08-01 DIAGNOSIS — R1031 Right lower quadrant pain: Secondary | ICD-10-CM | POA: Diagnosis not present

## 2016-08-01 DIAGNOSIS — R1030 Lower abdominal pain, unspecified: Secondary | ICD-10-CM | POA: Diagnosis not present

## 2016-08-31 DIAGNOSIS — F339 Major depressive disorder, recurrent, unspecified: Secondary | ICD-10-CM | POA: Diagnosis not present

## 2016-08-31 DIAGNOSIS — F419 Anxiety disorder, unspecified: Secondary | ICD-10-CM | POA: Diagnosis not present

## 2016-09-13 DIAGNOSIS — Z1231 Encounter for screening mammogram for malignant neoplasm of breast: Secondary | ICD-10-CM | POA: Diagnosis not present

## 2016-09-13 DIAGNOSIS — Z1211 Encounter for screening for malignant neoplasm of colon: Secondary | ICD-10-CM | POA: Diagnosis not present

## 2016-09-13 DIAGNOSIS — Z0000 Encounter for general adult medical examination without abnormal findings: Secondary | ICD-10-CM | POA: Diagnosis not present

## 2016-09-13 DIAGNOSIS — Z6830 Body mass index (BMI) 30.0-30.9, adult: Secondary | ICD-10-CM | POA: Diagnosis not present

## 2016-09-13 DIAGNOSIS — F329 Major depressive disorder, single episode, unspecified: Secondary | ICD-10-CM | POA: Diagnosis not present

## 2016-09-27 DIAGNOSIS — Z1231 Encounter for screening mammogram for malignant neoplasm of breast: Secondary | ICD-10-CM | POA: Diagnosis not present

## 2016-10-04 DIAGNOSIS — M7061 Trochanteric bursitis, right hip: Secondary | ICD-10-CM | POA: Diagnosis not present

## 2016-10-17 DIAGNOSIS — Z201 Contact with and (suspected) exposure to tuberculosis: Secondary | ICD-10-CM | POA: Diagnosis not present

## 2016-10-17 DIAGNOSIS — Z6830 Body mass index (BMI) 30.0-30.9, adult: Secondary | ICD-10-CM | POA: Diagnosis not present

## 2016-10-17 DIAGNOSIS — R05 Cough: Secondary | ICD-10-CM | POA: Diagnosis not present

## 2016-10-17 DIAGNOSIS — J0110 Acute frontal sinusitis, unspecified: Secondary | ICD-10-CM | POA: Diagnosis not present

## 2016-11-03 DIAGNOSIS — M7061 Trochanteric bursitis, right hip: Secondary | ICD-10-CM | POA: Diagnosis not present

## 2016-11-15 DIAGNOSIS — G43909 Migraine, unspecified, not intractable, without status migrainosus: Secondary | ICD-10-CM | POA: Diagnosis not present

## 2016-11-15 DIAGNOSIS — Z6830 Body mass index (BMI) 30.0-30.9, adult: Secondary | ICD-10-CM | POA: Diagnosis not present

## 2016-11-23 DIAGNOSIS — F339 Major depressive disorder, recurrent, unspecified: Secondary | ICD-10-CM | POA: Diagnosis not present

## 2016-11-23 DIAGNOSIS — F419 Anxiety disorder, unspecified: Secondary | ICD-10-CM | POA: Diagnosis not present

## 2016-11-27 DIAGNOSIS — J209 Acute bronchitis, unspecified: Secondary | ICD-10-CM | POA: Diagnosis not present

## 2016-11-27 DIAGNOSIS — J040 Acute laryngitis: Secondary | ICD-10-CM | POA: Diagnosis not present

## 2016-11-27 DIAGNOSIS — J0100 Acute maxillary sinusitis, unspecified: Secondary | ICD-10-CM | POA: Diagnosis not present

## 2016-12-28 DIAGNOSIS — E538 Deficiency of other specified B group vitamins: Secondary | ICD-10-CM | POA: Diagnosis not present

## 2016-12-28 DIAGNOSIS — R350 Frequency of micturition: Secondary | ICD-10-CM | POA: Diagnosis not present

## 2016-12-28 DIAGNOSIS — L502 Urticaria due to cold and heat: Secondary | ICD-10-CM | POA: Diagnosis not present

## 2016-12-28 DIAGNOSIS — Z6830 Body mass index (BMI) 30.0-30.9, adult: Secondary | ICD-10-CM | POA: Diagnosis not present

## 2017-01-01 DIAGNOSIS — R102 Pelvic and perineal pain: Secondary | ICD-10-CM | POA: Diagnosis not present

## 2017-01-02 DIAGNOSIS — N390 Urinary tract infection, site not specified: Secondary | ICD-10-CM | POA: Diagnosis not present

## 2017-01-02 DIAGNOSIS — R109 Unspecified abdominal pain: Secondary | ICD-10-CM | POA: Diagnosis not present

## 2017-01-02 DIAGNOSIS — K5900 Constipation, unspecified: Secondary | ICD-10-CM | POA: Diagnosis not present

## 2017-01-02 DIAGNOSIS — R102 Pelvic and perineal pain: Secondary | ICD-10-CM | POA: Diagnosis not present

## 2017-01-02 DIAGNOSIS — R1031 Right lower quadrant pain: Secondary | ICD-10-CM | POA: Diagnosis not present

## 2017-01-07 DIAGNOSIS — F419 Anxiety disorder, unspecified: Secondary | ICD-10-CM | POA: Diagnosis not present

## 2017-01-07 DIAGNOSIS — F339 Major depressive disorder, recurrent, unspecified: Secondary | ICD-10-CM | POA: Diagnosis not present

## 2017-01-22 DIAGNOSIS — M25551 Pain in right hip: Secondary | ICD-10-CM | POA: Diagnosis not present

## 2017-01-22 DIAGNOSIS — Z139 Encounter for screening, unspecified: Secondary | ICD-10-CM | POA: Diagnosis not present

## 2017-01-22 DIAGNOSIS — Z6831 Body mass index (BMI) 31.0-31.9, adult: Secondary | ICD-10-CM | POA: Diagnosis not present

## 2017-01-22 DIAGNOSIS — M797 Fibromyalgia: Secondary | ICD-10-CM | POA: Diagnosis not present

## 2017-01-26 DIAGNOSIS — M5442 Lumbago with sciatica, left side: Secondary | ICD-10-CM | POA: Diagnosis not present

## 2017-01-26 DIAGNOSIS — M545 Low back pain: Secondary | ICD-10-CM | POA: Diagnosis not present

## 2017-01-26 DIAGNOSIS — M5441 Lumbago with sciatica, right side: Secondary | ICD-10-CM | POA: Diagnosis not present

## 2017-01-28 ENCOUNTER — Other Ambulatory Visit: Payer: Self-pay | Admitting: Orthopedic Surgery

## 2017-01-28 DIAGNOSIS — R52 Pain, unspecified: Secondary | ICD-10-CM

## 2017-01-31 DIAGNOSIS — M5416 Radiculopathy, lumbar region: Secondary | ICD-10-CM | POA: Diagnosis not present

## 2017-01-31 DIAGNOSIS — M5136 Other intervertebral disc degeneration, lumbar region: Secondary | ICD-10-CM | POA: Diagnosis not present

## 2017-01-31 DIAGNOSIS — R2689 Other abnormalities of gait and mobility: Secondary | ICD-10-CM | POA: Diagnosis not present

## 2017-01-31 DIAGNOSIS — M2560 Stiffness of unspecified joint, not elsewhere classified: Secondary | ICD-10-CM | POA: Diagnosis not present

## 2017-02-05 DIAGNOSIS — R2689 Other abnormalities of gait and mobility: Secondary | ICD-10-CM | POA: Diagnosis not present

## 2017-02-05 DIAGNOSIS — M2560 Stiffness of unspecified joint, not elsewhere classified: Secondary | ICD-10-CM | POA: Diagnosis not present

## 2017-02-05 DIAGNOSIS — M5416 Radiculopathy, lumbar region: Secondary | ICD-10-CM | POA: Diagnosis not present

## 2017-02-05 DIAGNOSIS — M5136 Other intervertebral disc degeneration, lumbar region: Secondary | ICD-10-CM | POA: Diagnosis not present

## 2017-02-07 DIAGNOSIS — M25551 Pain in right hip: Secondary | ICD-10-CM | POA: Diagnosis not present

## 2017-02-07 DIAGNOSIS — R350 Frequency of micturition: Secondary | ICD-10-CM | POA: Diagnosis not present

## 2017-02-07 DIAGNOSIS — Z6832 Body mass index (BMI) 32.0-32.9, adult: Secondary | ICD-10-CM | POA: Diagnosis not present

## 2017-02-08 DIAGNOSIS — M5136 Other intervertebral disc degeneration, lumbar region: Secondary | ICD-10-CM | POA: Diagnosis not present

## 2017-02-08 DIAGNOSIS — M2560 Stiffness of unspecified joint, not elsewhere classified: Secondary | ICD-10-CM | POA: Diagnosis not present

## 2017-02-08 DIAGNOSIS — R2689 Other abnormalities of gait and mobility: Secondary | ICD-10-CM | POA: Diagnosis not present

## 2017-02-08 DIAGNOSIS — M5416 Radiculopathy, lumbar region: Secondary | ICD-10-CM | POA: Diagnosis not present

## 2017-02-11 DIAGNOSIS — M5136 Other intervertebral disc degeneration, lumbar region: Secondary | ICD-10-CM | POA: Diagnosis not present

## 2017-02-11 DIAGNOSIS — M5416 Radiculopathy, lumbar region: Secondary | ICD-10-CM | POA: Diagnosis not present

## 2017-02-11 DIAGNOSIS — R2689 Other abnormalities of gait and mobility: Secondary | ICD-10-CM | POA: Diagnosis not present

## 2017-02-11 DIAGNOSIS — M2560 Stiffness of unspecified joint, not elsewhere classified: Secondary | ICD-10-CM | POA: Diagnosis not present

## 2017-02-12 ENCOUNTER — Ambulatory Visit
Admission: RE | Admit: 2017-02-12 | Discharge: 2017-02-12 | Disposition: A | Discharge status: Home / Self Care | Payer: BLUE CROSS/BLUE SHIELD | Source: Ambulatory Visit | Attending: Orthopedic Surgery | Admitting: Orthopedic Surgery

## 2017-02-12 DIAGNOSIS — M5126 Other intervertebral disc displacement, lumbar region: Secondary | ICD-10-CM | POA: Diagnosis not present

## 2017-02-12 DIAGNOSIS — R52 Pain, unspecified: Secondary | ICD-10-CM

## 2017-02-14 DIAGNOSIS — M5441 Lumbago with sciatica, right side: Secondary | ICD-10-CM | POA: Diagnosis not present

## 2017-02-14 DIAGNOSIS — M5442 Lumbago with sciatica, left side: Secondary | ICD-10-CM | POA: Diagnosis not present

## 2017-02-15 DIAGNOSIS — L659 Nonscarring hair loss, unspecified: Secondary | ICD-10-CM | POA: Diagnosis not present

## 2017-02-15 DIAGNOSIS — Z6831 Body mass index (BMI) 31.0-31.9, adult: Secondary | ICD-10-CM | POA: Diagnosis not present

## 2017-02-15 DIAGNOSIS — R350 Frequency of micturition: Secondary | ICD-10-CM | POA: Diagnosis not present

## 2017-02-15 DIAGNOSIS — M5416 Radiculopathy, lumbar region: Secondary | ICD-10-CM | POA: Diagnosis not present

## 2017-02-21 DIAGNOSIS — L659 Nonscarring hair loss, unspecified: Secondary | ICD-10-CM | POA: Diagnosis not present

## 2017-02-21 DIAGNOSIS — Z6831 Body mass index (BMI) 31.0-31.9, adult: Secondary | ICD-10-CM | POA: Diagnosis not present

## 2017-02-27 DIAGNOSIS — M545 Low back pain: Secondary | ICD-10-CM | POA: Diagnosis not present

## 2017-03-11 DIAGNOSIS — R350 Frequency of micturition: Secondary | ICD-10-CM | POA: Diagnosis not present

## 2017-03-11 DIAGNOSIS — R310 Gross hematuria: Secondary | ICD-10-CM | POA: Diagnosis not present

## 2017-03-11 DIAGNOSIS — N3020 Other chronic cystitis without hematuria: Secondary | ICD-10-CM | POA: Diagnosis not present

## 2017-03-11 DIAGNOSIS — R319 Hematuria, unspecified: Secondary | ICD-10-CM | POA: Diagnosis not present

## 2017-03-13 DIAGNOSIS — M79604 Pain in right leg: Secondary | ICD-10-CM | POA: Diagnosis not present

## 2017-03-20 DIAGNOSIS — S20369A Insect bite (nonvenomous) of unspecified front wall of thorax, initial encounter: Secondary | ICD-10-CM | POA: Diagnosis not present

## 2017-04-02 DIAGNOSIS — G43119 Migraine with aura, intractable, without status migrainosus: Secondary | ICD-10-CM | POA: Diagnosis not present

## 2017-04-05 DIAGNOSIS — F419 Anxiety disorder, unspecified: Secondary | ICD-10-CM | POA: Diagnosis not present

## 2017-04-05 DIAGNOSIS — F410 Panic disorder [episodic paroxysmal anxiety] without agoraphobia: Secondary | ICD-10-CM | POA: Diagnosis not present

## 2017-04-05 DIAGNOSIS — F339 Major depressive disorder, recurrent, unspecified: Secondary | ICD-10-CM | POA: Diagnosis not present

## 2017-04-23 DIAGNOSIS — E039 Hypothyroidism, unspecified: Secondary | ICD-10-CM | POA: Diagnosis not present

## 2017-04-30 DIAGNOSIS — H9209 Otalgia, unspecified ear: Secondary | ICD-10-CM | POA: Diagnosis not present

## 2017-04-30 DIAGNOSIS — J029 Acute pharyngitis, unspecified: Secondary | ICD-10-CM | POA: Diagnosis not present

## 2017-04-30 DIAGNOSIS — N390 Urinary tract infection, site not specified: Secondary | ICD-10-CM | POA: Diagnosis not present

## 2017-04-30 DIAGNOSIS — E039 Hypothyroidism, unspecified: Secondary | ICD-10-CM | POA: Diagnosis not present

## 2017-05-01 DIAGNOSIS — E039 Hypothyroidism, unspecified: Secondary | ICD-10-CM | POA: Diagnosis not present

## 2017-05-07 DIAGNOSIS — E538 Deficiency of other specified B group vitamins: Secondary | ICD-10-CM | POA: Diagnosis not present

## 2017-05-07 DIAGNOSIS — R5383 Other fatigue: Secondary | ICD-10-CM | POA: Diagnosis not present

## 2017-05-07 DIAGNOSIS — R0683 Snoring: Secondary | ICD-10-CM | POA: Diagnosis not present

## 2017-05-07 DIAGNOSIS — E039 Hypothyroidism, unspecified: Secondary | ICD-10-CM | POA: Diagnosis not present

## 2017-05-10 DIAGNOSIS — Z87440 Personal history of urinary (tract) infections: Secondary | ICD-10-CM | POA: Diagnosis not present

## 2017-05-10 DIAGNOSIS — N390 Urinary tract infection, site not specified: Secondary | ICD-10-CM | POA: Diagnosis not present

## 2017-05-14 DIAGNOSIS — E538 Deficiency of other specified B group vitamins: Secondary | ICD-10-CM | POA: Diagnosis not present

## 2017-05-20 DIAGNOSIS — F339 Major depressive disorder, recurrent, unspecified: Secondary | ICD-10-CM | POA: Diagnosis not present

## 2017-05-20 DIAGNOSIS — R400 Somnolence: Secondary | ICD-10-CM | POA: Diagnosis not present

## 2017-05-20 DIAGNOSIS — R0683 Snoring: Secondary | ICD-10-CM | POA: Diagnosis not present

## 2017-05-20 DIAGNOSIS — R5383 Other fatigue: Secondary | ICD-10-CM | POA: Diagnosis not present

## 2017-05-30 DIAGNOSIS — R350 Frequency of micturition: Secondary | ICD-10-CM | POA: Diagnosis not present

## 2017-05-30 DIAGNOSIS — N390 Urinary tract infection, site not specified: Secondary | ICD-10-CM | POA: Diagnosis not present

## 2017-05-30 DIAGNOSIS — R310 Gross hematuria: Secondary | ICD-10-CM | POA: Diagnosis not present

## 2017-05-30 DIAGNOSIS — N3020 Other chronic cystitis without hematuria: Secondary | ICD-10-CM | POA: Diagnosis not present

## 2017-06-03 DIAGNOSIS — R109 Unspecified abdominal pain: Secondary | ICD-10-CM | POA: Diagnosis not present

## 2017-06-03 DIAGNOSIS — B373 Candidiasis of vulva and vagina: Secondary | ICD-10-CM | POA: Diagnosis not present

## 2017-06-03 DIAGNOSIS — N760 Acute vaginitis: Secondary | ICD-10-CM | POA: Diagnosis not present

## 2017-06-03 DIAGNOSIS — N390 Urinary tract infection, site not specified: Secondary | ICD-10-CM | POA: Diagnosis not present

## 2017-06-05 DIAGNOSIS — M5127 Other intervertebral disc displacement, lumbosacral region: Secondary | ICD-10-CM | POA: Diagnosis not present

## 2017-06-05 DIAGNOSIS — K5900 Constipation, unspecified: Secondary | ICD-10-CM | POA: Diagnosis not present

## 2017-06-05 DIAGNOSIS — R1030 Lower abdominal pain, unspecified: Secondary | ICD-10-CM | POA: Diagnosis not present

## 2017-06-05 DIAGNOSIS — R110 Nausea: Secondary | ICD-10-CM | POA: Diagnosis not present

## 2017-06-10 DIAGNOSIS — M5116 Intervertebral disc disorders with radiculopathy, lumbar region: Secondary | ICD-10-CM | POA: Diagnosis not present

## 2017-06-10 DIAGNOSIS — E669 Obesity, unspecified: Secondary | ICD-10-CM | POA: Diagnosis not present

## 2017-06-10 DIAGNOSIS — R102 Pelvic and perineal pain: Secondary | ICD-10-CM | POA: Diagnosis not present

## 2017-06-10 DIAGNOSIS — Z6831 Body mass index (BMI) 31.0-31.9, adult: Secondary | ICD-10-CM | POA: Diagnosis not present

## 2017-06-12 DIAGNOSIS — M545 Low back pain: Secondary | ICD-10-CM | POA: Diagnosis not present

## 2017-06-12 DIAGNOSIS — M546 Pain in thoracic spine: Secondary | ICD-10-CM | POA: Diagnosis not present

## 2017-06-12 DIAGNOSIS — M799 Soft tissue disorder, unspecified: Secondary | ICD-10-CM | POA: Diagnosis not present

## 2017-06-12 DIAGNOSIS — M6281 Muscle weakness (generalized): Secondary | ICD-10-CM | POA: Diagnosis not present

## 2017-06-14 DIAGNOSIS — M546 Pain in thoracic spine: Secondary | ICD-10-CM | POA: Diagnosis not present

## 2017-06-14 DIAGNOSIS — M6281 Muscle weakness (generalized): Secondary | ICD-10-CM | POA: Diagnosis not present

## 2017-06-14 DIAGNOSIS — M799 Soft tissue disorder, unspecified: Secondary | ICD-10-CM | POA: Diagnosis not present

## 2017-06-14 DIAGNOSIS — M545 Low back pain: Secondary | ICD-10-CM | POA: Diagnosis not present

## 2017-06-16 DIAGNOSIS — M545 Low back pain: Secondary | ICD-10-CM | POA: Diagnosis not present

## 2017-06-16 DIAGNOSIS — M799 Soft tissue disorder, unspecified: Secondary | ICD-10-CM | POA: Diagnosis not present

## 2017-06-16 DIAGNOSIS — M6281 Muscle weakness (generalized): Secondary | ICD-10-CM | POA: Diagnosis not present

## 2017-06-16 DIAGNOSIS — M546 Pain in thoracic spine: Secondary | ICD-10-CM | POA: Diagnosis not present

## 2017-06-17 DIAGNOSIS — F419 Anxiety disorder, unspecified: Secondary | ICD-10-CM | POA: Diagnosis not present

## 2017-06-17 DIAGNOSIS — F339 Major depressive disorder, recurrent, unspecified: Secondary | ICD-10-CM | POA: Diagnosis not present

## 2017-06-18 DIAGNOSIS — M799 Soft tissue disorder, unspecified: Secondary | ICD-10-CM | POA: Diagnosis not present

## 2017-06-18 DIAGNOSIS — M546 Pain in thoracic spine: Secondary | ICD-10-CM | POA: Diagnosis not present

## 2017-06-18 DIAGNOSIS — M545 Low back pain: Secondary | ICD-10-CM | POA: Diagnosis not present

## 2017-06-18 DIAGNOSIS — M6281 Muscle weakness (generalized): Secondary | ICD-10-CM | POA: Diagnosis not present

## 2017-06-18 DIAGNOSIS — M5416 Radiculopathy, lumbar region: Secondary | ICD-10-CM | POA: Diagnosis not present

## 2017-06-18 DIAGNOSIS — M5441 Lumbago with sciatica, right side: Secondary | ICD-10-CM | POA: Diagnosis not present

## 2017-06-24 DIAGNOSIS — M6281 Muscle weakness (generalized): Secondary | ICD-10-CM | POA: Diagnosis not present

## 2017-06-24 DIAGNOSIS — M545 Low back pain: Secondary | ICD-10-CM | POA: Diagnosis not present

## 2017-06-24 DIAGNOSIS — M546 Pain in thoracic spine: Secondary | ICD-10-CM | POA: Diagnosis not present

## 2017-06-24 DIAGNOSIS — M799 Soft tissue disorder, unspecified: Secondary | ICD-10-CM | POA: Diagnosis not present

## 2017-06-25 DIAGNOSIS — M799 Soft tissue disorder, unspecified: Secondary | ICD-10-CM | POA: Diagnosis not present

## 2017-06-25 DIAGNOSIS — M6281 Muscle weakness (generalized): Secondary | ICD-10-CM | POA: Diagnosis not present

## 2017-06-25 DIAGNOSIS — M545 Low back pain: Secondary | ICD-10-CM | POA: Diagnosis not present

## 2017-06-25 DIAGNOSIS — M546 Pain in thoracic spine: Secondary | ICD-10-CM | POA: Diagnosis not present

## 2017-06-26 DIAGNOSIS — R350 Frequency of micturition: Secondary | ICD-10-CM | POA: Diagnosis not present

## 2017-06-26 DIAGNOSIS — N3020 Other chronic cystitis without hematuria: Secondary | ICD-10-CM | POA: Diagnosis not present

## 2017-07-03 DIAGNOSIS — E039 Hypothyroidism, unspecified: Secondary | ICD-10-CM | POA: Diagnosis not present

## 2017-07-04 DIAGNOSIS — M799 Soft tissue disorder, unspecified: Secondary | ICD-10-CM | POA: Diagnosis not present

## 2017-07-04 DIAGNOSIS — M546 Pain in thoracic spine: Secondary | ICD-10-CM | POA: Diagnosis not present

## 2017-07-04 DIAGNOSIS — M6281 Muscle weakness (generalized): Secondary | ICD-10-CM | POA: Diagnosis not present

## 2017-07-04 DIAGNOSIS — M545 Low back pain: Secondary | ICD-10-CM | POA: Diagnosis not present

## 2017-07-08 DIAGNOSIS — M545 Low back pain: Secondary | ICD-10-CM | POA: Diagnosis not present

## 2017-07-08 DIAGNOSIS — M5417 Radiculopathy, lumbosacral region: Secondary | ICD-10-CM | POA: Diagnosis not present

## 2017-07-08 DIAGNOSIS — M5136 Other intervertebral disc degeneration, lumbar region: Secondary | ICD-10-CM | POA: Diagnosis not present

## 2017-07-08 DIAGNOSIS — M5416 Radiculopathy, lumbar region: Secondary | ICD-10-CM | POA: Diagnosis not present

## 2017-07-15 DIAGNOSIS — M791 Myalgia, unspecified site: Secondary | ICD-10-CM | POA: Diagnosis not present

## 2017-07-15 DIAGNOSIS — G43909 Migraine, unspecified, not intractable, without status migrainosus: Secondary | ICD-10-CM | POA: Diagnosis not present

## 2017-07-16 DIAGNOSIS — M5442 Lumbago with sciatica, left side: Secondary | ICD-10-CM | POA: Diagnosis not present

## 2017-07-16 DIAGNOSIS — G8929 Other chronic pain: Secondary | ICD-10-CM | POA: Diagnosis not present

## 2017-07-16 DIAGNOSIS — M5441 Lumbago with sciatica, right side: Secondary | ICD-10-CM | POA: Diagnosis not present

## 2017-07-17 DIAGNOSIS — M47816 Spondylosis without myelopathy or radiculopathy, lumbar region: Secondary | ICD-10-CM | POA: Diagnosis not present

## 2017-07-17 DIAGNOSIS — M5136 Other intervertebral disc degeneration, lumbar region: Secondary | ICD-10-CM | POA: Diagnosis not present

## 2017-07-17 DIAGNOSIS — G8929 Other chronic pain: Secondary | ICD-10-CM | POA: Diagnosis not present

## 2017-07-17 DIAGNOSIS — M5440 Lumbago with sciatica, unspecified side: Secondary | ICD-10-CM | POA: Diagnosis not present

## 2017-07-17 DIAGNOSIS — M545 Low back pain: Secondary | ICD-10-CM | POA: Diagnosis not present

## 2017-07-25 DIAGNOSIS — F419 Anxiety disorder, unspecified: Secondary | ICD-10-CM | POA: Diagnosis not present

## 2017-07-25 DIAGNOSIS — Z8673 Personal history of transient ischemic attack (TIA), and cerebral infarction without residual deficits: Secondary | ICD-10-CM | POA: Diagnosis not present

## 2017-07-25 DIAGNOSIS — M5127 Other intervertebral disc displacement, lumbosacral region: Secondary | ICD-10-CM | POA: Diagnosis not present

## 2017-07-25 DIAGNOSIS — Z79899 Other long term (current) drug therapy: Secondary | ICD-10-CM | POA: Diagnosis not present

## 2017-07-25 DIAGNOSIS — F329 Major depressive disorder, single episode, unspecified: Secondary | ICD-10-CM | POA: Diagnosis not present

## 2017-07-25 DIAGNOSIS — R51 Headache: Secondary | ICD-10-CM | POA: Diagnosis not present

## 2017-07-25 DIAGNOSIS — M5442 Lumbago with sciatica, left side: Secondary | ICD-10-CM | POA: Diagnosis not present

## 2017-07-25 DIAGNOSIS — R531 Weakness: Secondary | ICD-10-CM | POA: Diagnosis not present

## 2017-07-25 DIAGNOSIS — M47816 Spondylosis without myelopathy or radiculopathy, lumbar region: Secondary | ICD-10-CM | POA: Diagnosis not present

## 2017-07-25 DIAGNOSIS — D1809 Hemangioma of other sites: Secondary | ICD-10-CM | POA: Diagnosis not present

## 2017-07-25 DIAGNOSIS — G43909 Migraine, unspecified, not intractable, without status migrainosus: Secondary | ICD-10-CM | POA: Diagnosis not present

## 2017-07-25 DIAGNOSIS — G8929 Other chronic pain: Secondary | ICD-10-CM | POA: Diagnosis not present

## 2017-07-25 DIAGNOSIS — M797 Fibromyalgia: Secondary | ICD-10-CM | POA: Diagnosis not present

## 2017-07-25 DIAGNOSIS — M5441 Lumbago with sciatica, right side: Secondary | ICD-10-CM | POA: Diagnosis not present

## 2017-08-08 DIAGNOSIS — K5900 Constipation, unspecified: Secondary | ICD-10-CM | POA: Diagnosis not present

## 2017-08-08 DIAGNOSIS — R350 Frequency of micturition: Secondary | ICD-10-CM | POA: Diagnosis not present

## 2017-08-08 DIAGNOSIS — N3020 Other chronic cystitis without hematuria: Secondary | ICD-10-CM | POA: Diagnosis not present

## 2017-08-13 DIAGNOSIS — M7918 Myalgia, other site: Secondary | ICD-10-CM | POA: Diagnosis not present

## 2017-08-13 DIAGNOSIS — M5442 Lumbago with sciatica, left side: Secondary | ICD-10-CM | POA: Diagnosis not present

## 2017-08-13 DIAGNOSIS — G8929 Other chronic pain: Secondary | ICD-10-CM | POA: Diagnosis not present

## 2017-08-13 DIAGNOSIS — M5441 Lumbago with sciatica, right side: Secondary | ICD-10-CM | POA: Diagnosis not present

## 2017-08-15 DIAGNOSIS — G43909 Migraine, unspecified, not intractable, without status migrainosus: Secondary | ICD-10-CM | POA: Diagnosis not present

## 2017-08-15 DIAGNOSIS — R7989 Other specified abnormal findings of blood chemistry: Secondary | ICD-10-CM | POA: Diagnosis not present

## 2017-08-15 DIAGNOSIS — Z6831 Body mass index (BMI) 31.0-31.9, adult: Secondary | ICD-10-CM | POA: Diagnosis not present

## 2017-08-15 DIAGNOSIS — M5116 Intervertebral disc disorders with radiculopathy, lumbar region: Secondary | ICD-10-CM | POA: Diagnosis not present

## 2017-08-21 DIAGNOSIS — K591 Functional diarrhea: Secondary | ICD-10-CM | POA: Diagnosis not present

## 2017-08-21 DIAGNOSIS — B9789 Other viral agents as the cause of diseases classified elsewhere: Secondary | ICD-10-CM | POA: Diagnosis not present

## 2017-09-05 DIAGNOSIS — E0590 Thyrotoxicosis, unspecified without thyrotoxic crisis or storm: Secondary | ICD-10-CM | POA: Diagnosis not present

## 2017-09-07 DIAGNOSIS — R05 Cough: Secondary | ICD-10-CM | POA: Diagnosis not present

## 2017-09-07 DIAGNOSIS — R0602 Shortness of breath: Secondary | ICD-10-CM | POA: Diagnosis not present

## 2017-09-09 DIAGNOSIS — R079 Chest pain, unspecified: Secondary | ICD-10-CM | POA: Diagnosis not present

## 2017-09-09 DIAGNOSIS — R0602 Shortness of breath: Secondary | ICD-10-CM | POA: Diagnosis not present

## 2017-09-09 DIAGNOSIS — I309 Acute pericarditis, unspecified: Secondary | ICD-10-CM | POA: Diagnosis not present

## 2017-09-09 DIAGNOSIS — R05 Cough: Secondary | ICD-10-CM | POA: Diagnosis not present

## 2017-09-09 DIAGNOSIS — J111 Influenza due to unidentified influenza virus with other respiratory manifestations: Secondary | ICD-10-CM | POA: Diagnosis not present

## 2017-09-09 DIAGNOSIS — Z8673 Personal history of transient ischemic attack (TIA), and cerebral infarction without residual deficits: Secondary | ICD-10-CM | POA: Diagnosis not present

## 2017-09-09 DIAGNOSIS — I313 Pericardial effusion (noninflammatory): Secondary | ICD-10-CM | POA: Diagnosis not present

## 2017-09-09 DIAGNOSIS — R0789 Other chest pain: Secondary | ICD-10-CM | POA: Diagnosis not present

## 2017-09-09 DIAGNOSIS — G4700 Insomnia, unspecified: Secondary | ICD-10-CM | POA: Diagnosis not present

## 2017-09-09 DIAGNOSIS — Z7982 Long term (current) use of aspirin: Secondary | ICD-10-CM | POA: Diagnosis not present

## 2017-09-09 DIAGNOSIS — I300 Acute nonspecific idiopathic pericarditis: Secondary | ICD-10-CM | POA: Diagnosis not present

## 2017-09-09 DIAGNOSIS — R000 Tachycardia, unspecified: Secondary | ICD-10-CM | POA: Diagnosis not present

## 2017-09-09 DIAGNOSIS — F329 Major depressive disorder, single episode, unspecified: Secondary | ICD-10-CM | POA: Diagnosis not present

## 2017-09-10 DIAGNOSIS — I300 Acute nonspecific idiopathic pericarditis: Secondary | ICD-10-CM | POA: Diagnosis not present

## 2017-09-10 DIAGNOSIS — I309 Acute pericarditis, unspecified: Secondary | ICD-10-CM | POA: Diagnosis not present

## 2017-09-10 DIAGNOSIS — J111 Influenza due to unidentified influenza virus with other respiratory manifestations: Secondary | ICD-10-CM | POA: Diagnosis not present

## 2017-09-10 DIAGNOSIS — R0602 Shortness of breath: Secondary | ICD-10-CM | POA: Diagnosis not present

## 2017-09-10 DIAGNOSIS — Z7982 Long term (current) use of aspirin: Secondary | ICD-10-CM | POA: Diagnosis not present

## 2017-09-12 DIAGNOSIS — M2550 Pain in unspecified joint: Secondary | ICD-10-CM | POA: Diagnosis not present

## 2017-09-12 DIAGNOSIS — R079 Chest pain, unspecified: Secondary | ICD-10-CM | POA: Diagnosis not present

## 2017-09-12 DIAGNOSIS — M797 Fibromyalgia: Secondary | ICD-10-CM | POA: Diagnosis not present

## 2017-09-12 DIAGNOSIS — D709 Neutropenia, unspecified: Secondary | ICD-10-CM | POA: Insufficient documentation

## 2017-09-12 DIAGNOSIS — D649 Anemia, unspecified: Secondary | ICD-10-CM | POA: Diagnosis not present

## 2017-09-12 DIAGNOSIS — I308 Other forms of acute pericarditis: Secondary | ICD-10-CM | POA: Diagnosis not present

## 2017-09-12 DIAGNOSIS — D696 Thrombocytopenia, unspecified: Secondary | ICD-10-CM | POA: Diagnosis not present

## 2017-09-12 DIAGNOSIS — R000 Tachycardia, unspecified: Secondary | ICD-10-CM | POA: Diagnosis not present

## 2017-09-12 DIAGNOSIS — F419 Anxiety disorder, unspecified: Secondary | ICD-10-CM | POA: Diagnosis not present

## 2017-09-12 DIAGNOSIS — Z7982 Long term (current) use of aspirin: Secondary | ICD-10-CM | POA: Diagnosis not present

## 2017-09-12 DIAGNOSIS — I313 Pericardial effusion (noninflammatory): Secondary | ICD-10-CM | POA: Insufficient documentation

## 2017-09-12 DIAGNOSIS — R0602 Shortness of breath: Secondary | ICD-10-CM | POA: Diagnosis not present

## 2017-09-12 DIAGNOSIS — Z8673 Personal history of transient ischemic attack (TIA), and cerebral infarction without residual deficits: Secondary | ICD-10-CM | POA: Diagnosis not present

## 2017-09-12 DIAGNOSIS — J101 Influenza due to other identified influenza virus with other respiratory manifestations: Secondary | ICD-10-CM | POA: Diagnosis not present

## 2017-09-12 DIAGNOSIS — I3139 Other pericardial effusion (noninflammatory): Secondary | ICD-10-CM | POA: Insufficient documentation

## 2017-09-13 DIAGNOSIS — D696 Thrombocytopenia, unspecified: Secondary | ICD-10-CM | POA: Insufficient documentation

## 2017-09-13 DIAGNOSIS — I308 Other forms of acute pericarditis: Secondary | ICD-10-CM | POA: Diagnosis not present

## 2017-09-13 DIAGNOSIS — R079 Chest pain, unspecified: Secondary | ICD-10-CM | POA: Diagnosis not present

## 2017-09-13 DIAGNOSIS — R0602 Shortness of breath: Secondary | ICD-10-CM | POA: Diagnosis not present

## 2017-09-13 DIAGNOSIS — D649 Anemia, unspecified: Secondary | ICD-10-CM | POA: Insufficient documentation

## 2017-09-13 DIAGNOSIS — Z8619 Personal history of other infectious and parasitic diseases: Secondary | ICD-10-CM | POA: Diagnosis not present

## 2017-09-19 DIAGNOSIS — E669 Obesity, unspecified: Secondary | ICD-10-CM | POA: Diagnosis not present

## 2017-09-19 DIAGNOSIS — R000 Tachycardia, unspecified: Secondary | ICD-10-CM | POA: Diagnosis not present

## 2017-09-19 DIAGNOSIS — I319 Disease of pericardium, unspecified: Secondary | ICD-10-CM | POA: Diagnosis not present

## 2017-09-19 DIAGNOSIS — Z6831 Body mass index (BMI) 31.0-31.9, adult: Secondary | ICD-10-CM | POA: Diagnosis not present

## 2017-09-25 DIAGNOSIS — R079 Chest pain, unspecified: Secondary | ICD-10-CM | POA: Diagnosis not present

## 2017-09-27 DIAGNOSIS — F419 Anxiety disorder, unspecified: Secondary | ICD-10-CM | POA: Diagnosis not present

## 2017-09-27 DIAGNOSIS — F339 Major depressive disorder, recurrent, unspecified: Secondary | ICD-10-CM | POA: Diagnosis not present

## 2017-09-27 DIAGNOSIS — F331 Major depressive disorder, recurrent, moderate: Secondary | ICD-10-CM | POA: Insufficient documentation

## 2017-09-27 DIAGNOSIS — G4701 Insomnia due to medical condition: Secondary | ICD-10-CM | POA: Diagnosis not present

## 2017-10-02 DIAGNOSIS — R091 Pleurisy: Secondary | ICD-10-CM | POA: Diagnosis not present

## 2017-10-02 DIAGNOSIS — F419 Anxiety disorder, unspecified: Secondary | ICD-10-CM | POA: Diagnosis not present

## 2017-10-02 DIAGNOSIS — E669 Obesity, unspecified: Secondary | ICD-10-CM | POA: Diagnosis not present

## 2017-10-02 DIAGNOSIS — R258 Other abnormal involuntary movements: Secondary | ICD-10-CM | POA: Diagnosis not present

## 2017-10-02 DIAGNOSIS — F450 Somatization disorder: Secondary | ICD-10-CM | POA: Diagnosis not present

## 2017-10-02 DIAGNOSIS — F445 Conversion disorder with seizures or convulsions: Secondary | ICD-10-CM | POA: Diagnosis not present

## 2017-10-02 DIAGNOSIS — R079 Chest pain, unspecified: Secondary | ICD-10-CM | POA: Diagnosis not present

## 2017-10-02 DIAGNOSIS — F332 Major depressive disorder, recurrent severe without psychotic features: Secondary | ICD-10-CM | POA: Diagnosis not present

## 2017-10-02 DIAGNOSIS — R0609 Other forms of dyspnea: Secondary | ICD-10-CM | POA: Diagnosis not present

## 2017-10-02 DIAGNOSIS — R000 Tachycardia, unspecified: Secondary | ICD-10-CM | POA: Diagnosis not present

## 2017-10-02 DIAGNOSIS — J111 Influenza due to unidentified influenza virus with other respiratory manifestations: Secondary | ICD-10-CM | POA: Diagnosis not present

## 2017-10-02 DIAGNOSIS — Z9049 Acquired absence of other specified parts of digestive tract: Secondary | ICD-10-CM | POA: Diagnosis not present

## 2017-10-02 DIAGNOSIS — G43909 Migraine, unspecified, not intractable, without status migrainosus: Secondary | ICD-10-CM | POA: Diagnosis not present

## 2017-10-02 DIAGNOSIS — R253 Fasciculation: Secondary | ICD-10-CM | POA: Diagnosis not present

## 2017-10-02 DIAGNOSIS — F410 Panic disorder [episodic paroxysmal anxiety] without agoraphobia: Secondary | ICD-10-CM | POA: Diagnosis not present

## 2017-10-02 DIAGNOSIS — Z90710 Acquired absence of both cervix and uterus: Secondary | ICD-10-CM | POA: Diagnosis not present

## 2017-10-02 DIAGNOSIS — R531 Weakness: Secondary | ICD-10-CM | POA: Diagnosis not present

## 2017-10-02 DIAGNOSIS — I471 Supraventricular tachycardia: Secondary | ICD-10-CM | POA: Diagnosis not present

## 2017-10-02 DIAGNOSIS — E876 Hypokalemia: Secondary | ICD-10-CM | POA: Diagnosis not present

## 2017-10-02 DIAGNOSIS — G459 Transient cerebral ischemic attack, unspecified: Secondary | ICD-10-CM | POA: Diagnosis not present

## 2017-10-02 DIAGNOSIS — R200 Anesthesia of skin: Secondary | ICD-10-CM | POA: Diagnosis not present

## 2017-10-02 DIAGNOSIS — K219 Gastro-esophageal reflux disease without esophagitis: Secondary | ICD-10-CM | POA: Diagnosis not present

## 2017-10-02 DIAGNOSIS — M797 Fibromyalgia: Secondary | ICD-10-CM | POA: Diagnosis not present

## 2017-10-02 DIAGNOSIS — R569 Unspecified convulsions: Secondary | ICD-10-CM | POA: Diagnosis not present

## 2017-10-02 DIAGNOSIS — R0602 Shortness of breath: Secondary | ICD-10-CM | POA: Diagnosis not present

## 2017-10-02 DIAGNOSIS — Z8679 Personal history of other diseases of the circulatory system: Secondary | ICD-10-CM | POA: Diagnosis not present

## 2017-10-02 DIAGNOSIS — R0789 Other chest pain: Secondary | ICD-10-CM | POA: Diagnosis not present

## 2017-10-02 DIAGNOSIS — R404 Transient alteration of awareness: Secondary | ICD-10-CM | POA: Diagnosis not present

## 2017-10-02 DIAGNOSIS — Z8673 Personal history of transient ischemic attack (TIA), and cerebral infarction without residual deficits: Secondary | ICD-10-CM | POA: Diagnosis not present

## 2017-10-02 DIAGNOSIS — Z6830 Body mass index (BMI) 30.0-30.9, adult: Secondary | ICD-10-CM | POA: Diagnosis not present

## 2017-10-02 DIAGNOSIS — Z7982 Long term (current) use of aspirin: Secondary | ICD-10-CM | POA: Diagnosis not present

## 2017-10-03 DIAGNOSIS — R000 Tachycardia, unspecified: Secondary | ICD-10-CM | POA: Insufficient documentation

## 2017-10-03 DIAGNOSIS — Z8679 Personal history of other diseases of the circulatory system: Secondary | ICD-10-CM | POA: Diagnosis not present

## 2017-10-03 DIAGNOSIS — G459 Transient cerebral ischemic attack, unspecified: Secondary | ICD-10-CM | POA: Diagnosis not present

## 2017-10-03 DIAGNOSIS — R0602 Shortness of breath: Secondary | ICD-10-CM | POA: Diagnosis not present

## 2017-10-03 DIAGNOSIS — R0789 Other chest pain: Secondary | ICD-10-CM | POA: Diagnosis not present

## 2017-10-03 DIAGNOSIS — J111 Influenza due to unidentified influenza virus with other respiratory manifestations: Secondary | ICD-10-CM | POA: Diagnosis not present

## 2017-10-03 DIAGNOSIS — R0609 Other forms of dyspnea: Secondary | ICD-10-CM | POA: Insufficient documentation

## 2017-10-03 DIAGNOSIS — M797 Fibromyalgia: Secondary | ICD-10-CM | POA: Diagnosis not present

## 2017-10-03 DIAGNOSIS — R091 Pleurisy: Secondary | ICD-10-CM | POA: Diagnosis not present

## 2017-10-04 DIAGNOSIS — M797 Fibromyalgia: Secondary | ICD-10-CM | POA: Diagnosis not present

## 2017-10-04 DIAGNOSIS — J111 Influenza due to unidentified influenza virus with other respiratory manifestations: Secondary | ICD-10-CM | POA: Diagnosis not present

## 2017-10-04 DIAGNOSIS — F445 Conversion disorder with seizures or convulsions: Secondary | ICD-10-CM | POA: Diagnosis not present

## 2017-10-04 DIAGNOSIS — R253 Fasciculation: Secondary | ICD-10-CM | POA: Diagnosis not present

## 2017-10-04 DIAGNOSIS — R091 Pleurisy: Secondary | ICD-10-CM | POA: Diagnosis not present

## 2017-10-04 DIAGNOSIS — R404 Transient alteration of awareness: Secondary | ICD-10-CM | POA: Diagnosis not present

## 2017-10-04 DIAGNOSIS — R258 Other abnormal involuntary movements: Secondary | ICD-10-CM | POA: Diagnosis not present

## 2017-10-05 DIAGNOSIS — F410 Panic disorder [episodic paroxysmal anxiety] without agoraphobia: Secondary | ICD-10-CM | POA: Insufficient documentation

## 2017-10-05 DIAGNOSIS — R0602 Shortness of breath: Secondary | ICD-10-CM | POA: Diagnosis not present

## 2017-10-05 DIAGNOSIS — F332 Major depressive disorder, recurrent severe without psychotic features: Secondary | ICD-10-CM | POA: Diagnosis not present

## 2017-10-05 DIAGNOSIS — J111 Influenza due to unidentified influenza virus with other respiratory manifestations: Secondary | ICD-10-CM | POA: Diagnosis not present

## 2017-10-05 DIAGNOSIS — I471 Supraventricular tachycardia: Secondary | ICD-10-CM | POA: Insufficient documentation

## 2017-10-05 DIAGNOSIS — R091 Pleurisy: Secondary | ICD-10-CM | POA: Diagnosis not present

## 2017-10-05 DIAGNOSIS — M797 Fibromyalgia: Secondary | ICD-10-CM | POA: Diagnosis not present

## 2017-10-05 DIAGNOSIS — F450 Somatization disorder: Secondary | ICD-10-CM | POA: Diagnosis not present

## 2017-10-06 DIAGNOSIS — M797 Fibromyalgia: Secondary | ICD-10-CM | POA: Diagnosis not present

## 2017-10-06 DIAGNOSIS — J111 Influenza due to unidentified influenza virus with other respiratory manifestations: Secondary | ICD-10-CM | POA: Diagnosis not present

## 2017-10-06 DIAGNOSIS — I471 Supraventricular tachycardia: Secondary | ICD-10-CM | POA: Diagnosis not present

## 2017-10-06 DIAGNOSIS — R091 Pleurisy: Secondary | ICD-10-CM | POA: Diagnosis not present

## 2017-10-07 DIAGNOSIS — I517 Cardiomegaly: Secondary | ICD-10-CM | POA: Diagnosis not present

## 2017-10-07 DIAGNOSIS — R000 Tachycardia, unspecified: Secondary | ICD-10-CM | POA: Diagnosis not present

## 2017-10-08 DIAGNOSIS — R072 Precordial pain: Secondary | ICD-10-CM | POA: Diagnosis not present

## 2017-10-08 DIAGNOSIS — J80 Acute respiratory distress syndrome: Secondary | ICD-10-CM | POA: Diagnosis not present

## 2017-10-08 DIAGNOSIS — R0789 Other chest pain: Secondary | ICD-10-CM | POA: Diagnosis not present

## 2017-10-08 DIAGNOSIS — R0602 Shortness of breath: Secondary | ICD-10-CM | POA: Diagnosis not present

## 2017-10-08 DIAGNOSIS — R079 Chest pain, unspecified: Secondary | ICD-10-CM | POA: Diagnosis not present

## 2017-10-08 DIAGNOSIS — F419 Anxiety disorder, unspecified: Secondary | ICD-10-CM | POA: Diagnosis not present

## 2017-10-08 DIAGNOSIS — Z8673 Personal history of transient ischemic attack (TIA), and cerebral infarction without residual deficits: Secondary | ICD-10-CM | POA: Diagnosis not present

## 2017-10-10 DIAGNOSIS — J3089 Other allergic rhinitis: Secondary | ICD-10-CM | POA: Diagnosis not present

## 2017-10-10 DIAGNOSIS — I319 Disease of pericardium, unspecified: Secondary | ICD-10-CM | POA: Diagnosis not present

## 2017-10-10 DIAGNOSIS — R0609 Other forms of dyspnea: Secondary | ICD-10-CM | POA: Diagnosis not present

## 2017-10-10 DIAGNOSIS — G43909 Migraine, unspecified, not intractable, without status migrainosus: Secondary | ICD-10-CM | POA: Diagnosis not present

## 2017-10-10 DIAGNOSIS — R0602 Shortness of breath: Secondary | ICD-10-CM | POA: Diagnosis not present

## 2017-10-10 DIAGNOSIS — R0981 Nasal congestion: Secondary | ICD-10-CM | POA: Diagnosis not present

## 2017-10-10 DIAGNOSIS — E876 Hypokalemia: Secondary | ICD-10-CM | POA: Diagnosis not present

## 2017-10-15 DIAGNOSIS — K219 Gastro-esophageal reflux disease without esophagitis: Secondary | ICD-10-CM | POA: Diagnosis not present

## 2017-10-15 DIAGNOSIS — R0609 Other forms of dyspnea: Secondary | ICD-10-CM | POA: Diagnosis not present

## 2017-10-15 DIAGNOSIS — I310 Chronic adhesive pericarditis: Secondary | ICD-10-CM | POA: Diagnosis not present

## 2017-10-15 DIAGNOSIS — E782 Mixed hyperlipidemia: Secondary | ICD-10-CM | POA: Diagnosis not present

## 2017-10-15 DIAGNOSIS — R0602 Shortness of breath: Secondary | ICD-10-CM | POA: Diagnosis not present

## 2017-10-15 DIAGNOSIS — R55 Syncope and collapse: Secondary | ICD-10-CM | POA: Diagnosis not present

## 2017-10-15 DIAGNOSIS — F329 Major depressive disorder, single episode, unspecified: Secondary | ICD-10-CM | POA: Diagnosis not present

## 2017-10-15 DIAGNOSIS — I479 Paroxysmal tachycardia, unspecified: Secondary | ICD-10-CM | POA: Diagnosis not present

## 2017-10-16 DIAGNOSIS — F339 Major depressive disorder, recurrent, unspecified: Secondary | ICD-10-CM | POA: Diagnosis not present

## 2017-10-16 DIAGNOSIS — F410 Panic disorder [episodic paroxysmal anxiety] without agoraphobia: Secondary | ICD-10-CM | POA: Diagnosis not present

## 2017-10-16 DIAGNOSIS — F419 Anxiety disorder, unspecified: Secondary | ICD-10-CM | POA: Diagnosis not present

## 2017-10-16 DIAGNOSIS — G4701 Insomnia due to medical condition: Secondary | ICD-10-CM | POA: Diagnosis not present

## 2017-10-17 ENCOUNTER — Ambulatory Visit: Payer: BLUE CROSS/BLUE SHIELD | Admitting: Cardiology

## 2017-11-04 DIAGNOSIS — F329 Major depressive disorder, single episode, unspecified: Secondary | ICD-10-CM | POA: Diagnosis not present

## 2017-11-04 DIAGNOSIS — R0602 Shortness of breath: Secondary | ICD-10-CM | POA: Diagnosis not present

## 2017-11-04 DIAGNOSIS — K589 Irritable bowel syndrome without diarrhea: Secondary | ICD-10-CM | POA: Diagnosis not present

## 2017-11-05 DIAGNOSIS — R55 Syncope and collapse: Secondary | ICD-10-CM | POA: Diagnosis not present

## 2017-11-05 DIAGNOSIS — R0609 Other forms of dyspnea: Secondary | ICD-10-CM | POA: Diagnosis not present

## 2017-11-05 DIAGNOSIS — E782 Mixed hyperlipidemia: Secondary | ICD-10-CM | POA: Diagnosis not present

## 2017-11-13 DIAGNOSIS — F419 Anxiety disorder, unspecified: Secondary | ICD-10-CM | POA: Diagnosis not present

## 2017-11-13 DIAGNOSIS — R0609 Other forms of dyspnea: Secondary | ICD-10-CM | POA: Diagnosis not present

## 2017-11-13 DIAGNOSIS — I479 Paroxysmal tachycardia, unspecified: Secondary | ICD-10-CM | POA: Diagnosis not present

## 2017-11-13 DIAGNOSIS — F410 Panic disorder [episodic paroxysmal anxiety] without agoraphobia: Secondary | ICD-10-CM | POA: Diagnosis not present

## 2017-11-13 DIAGNOSIS — F339 Major depressive disorder, recurrent, unspecified: Secondary | ICD-10-CM | POA: Diagnosis not present

## 2017-11-13 DIAGNOSIS — G4701 Insomnia due to medical condition: Secondary | ICD-10-CM | POA: Diagnosis not present

## 2017-11-15 DIAGNOSIS — R55 Syncope and collapse: Secondary | ICD-10-CM | POA: Diagnosis not present

## 2017-11-15 DIAGNOSIS — R0602 Shortness of breath: Secondary | ICD-10-CM | POA: Diagnosis not present

## 2017-11-15 DIAGNOSIS — R0609 Other forms of dyspnea: Secondary | ICD-10-CM | POA: Diagnosis not present

## 2017-11-20 DIAGNOSIS — N390 Urinary tract infection, site not specified: Secondary | ICD-10-CM | POA: Diagnosis not present

## 2017-11-24 DIAGNOSIS — Z8673 Personal history of transient ischemic attack (TIA), and cerebral infarction without residual deficits: Secondary | ICD-10-CM | POA: Diagnosis not present

## 2017-11-24 DIAGNOSIS — R0789 Other chest pain: Secondary | ICD-10-CM | POA: Diagnosis not present

## 2017-11-24 DIAGNOSIS — E039 Hypothyroidism, unspecified: Secondary | ICD-10-CM | POA: Diagnosis not present

## 2017-11-24 DIAGNOSIS — K5901 Slow transit constipation: Secondary | ICD-10-CM | POA: Diagnosis not present

## 2017-11-24 DIAGNOSIS — R079 Chest pain, unspecified: Secondary | ICD-10-CM | POA: Diagnosis not present

## 2017-11-24 DIAGNOSIS — M797 Fibromyalgia: Secondary | ICD-10-CM | POA: Diagnosis not present

## 2017-11-24 DIAGNOSIS — R197 Diarrhea, unspecified: Secondary | ICD-10-CM | POA: Diagnosis not present

## 2017-12-05 DIAGNOSIS — R079 Chest pain, unspecified: Secondary | ICD-10-CM | POA: Diagnosis not present

## 2017-12-05 DIAGNOSIS — I319 Disease of pericardium, unspecified: Secondary | ICD-10-CM | POA: Diagnosis not present

## 2017-12-05 DIAGNOSIS — J111 Influenza due to unidentified influenza virus with other respiratory manifestations: Secondary | ICD-10-CM | POA: Diagnosis not present

## 2017-12-05 DIAGNOSIS — I2510 Atherosclerotic heart disease of native coronary artery without angina pectoris: Secondary | ICD-10-CM | POA: Diagnosis not present

## 2017-12-05 DIAGNOSIS — I313 Pericardial effusion (noninflammatory): Secondary | ICD-10-CM | POA: Diagnosis not present

## 2017-12-11 DIAGNOSIS — F339 Major depressive disorder, recurrent, unspecified: Secondary | ICD-10-CM | POA: Diagnosis not present

## 2017-12-11 DIAGNOSIS — F410 Panic disorder [episodic paroxysmal anxiety] without agoraphobia: Secondary | ICD-10-CM | POA: Diagnosis not present

## 2017-12-11 DIAGNOSIS — F411 Generalized anxiety disorder: Secondary | ICD-10-CM | POA: Diagnosis not present

## 2017-12-12 DIAGNOSIS — E782 Mixed hyperlipidemia: Secondary | ICD-10-CM | POA: Diagnosis not present

## 2017-12-12 DIAGNOSIS — I310 Chronic adhesive pericarditis: Secondary | ICD-10-CM | POA: Diagnosis not present

## 2017-12-12 DIAGNOSIS — I479 Paroxysmal tachycardia, unspecified: Secondary | ICD-10-CM | POA: Diagnosis not present

## 2017-12-12 DIAGNOSIS — R55 Syncope and collapse: Secondary | ICD-10-CM | POA: Diagnosis not present

## 2017-12-25 DIAGNOSIS — Z6832 Body mass index (BMI) 32.0-32.9, adult: Secondary | ICD-10-CM | POA: Diagnosis not present

## 2017-12-25 DIAGNOSIS — B373 Candidiasis of vulva and vagina: Secondary | ICD-10-CM | POA: Diagnosis not present

## 2018-01-23 ENCOUNTER — Encounter: Payer: Self-pay | Admitting: Sports Medicine

## 2018-01-24 NOTE — Progress Notes (Signed)
Patient canceled.  This encounter was created in error - please disregard. 

## 2018-01-26 DIAGNOSIS — R072 Precordial pain: Secondary | ICD-10-CM | POA: Diagnosis not present

## 2018-01-26 DIAGNOSIS — M5489 Other dorsalgia: Secondary | ICD-10-CM | POA: Diagnosis not present

## 2018-01-26 DIAGNOSIS — I517 Cardiomegaly: Secondary | ICD-10-CM | POA: Diagnosis not present

## 2018-01-26 DIAGNOSIS — R0789 Other chest pain: Secondary | ICD-10-CM | POA: Diagnosis not present

## 2018-01-26 DIAGNOSIS — R079 Chest pain, unspecified: Secondary | ICD-10-CM | POA: Diagnosis not present

## 2018-01-26 DIAGNOSIS — R457 State of emotional shock and stress, unspecified: Secondary | ICD-10-CM | POA: Diagnosis not present

## 2018-01-26 DIAGNOSIS — Z885 Allergy status to narcotic agent status: Secondary | ICD-10-CM | POA: Diagnosis not present

## 2018-01-26 DIAGNOSIS — Z888 Allergy status to other drugs, medicaments and biological substances status: Secondary | ICD-10-CM | POA: Diagnosis not present

## 2018-01-26 DIAGNOSIS — M549 Dorsalgia, unspecified: Secondary | ICD-10-CM | POA: Diagnosis not present

## 2018-01-26 DIAGNOSIS — R0602 Shortness of breath: Secondary | ICD-10-CM | POA: Diagnosis not present

## 2018-01-27 DIAGNOSIS — I517 Cardiomegaly: Secondary | ICD-10-CM | POA: Diagnosis not present

## 2018-01-28 DIAGNOSIS — Z6833 Body mass index (BMI) 33.0-33.9, adult: Secondary | ICD-10-CM | POA: Diagnosis not present

## 2018-01-28 DIAGNOSIS — M7918 Myalgia, other site: Secondary | ICD-10-CM | POA: Diagnosis not present

## 2018-01-28 DIAGNOSIS — M2550 Pain in unspecified joint: Secondary | ICD-10-CM | POA: Diagnosis not present

## 2018-01-28 DIAGNOSIS — M797 Fibromyalgia: Secondary | ICD-10-CM | POA: Diagnosis not present

## 2018-01-28 DIAGNOSIS — G8929 Other chronic pain: Secondary | ICD-10-CM | POA: Diagnosis not present

## 2018-01-28 DIAGNOSIS — E039 Hypothyroidism, unspecified: Secondary | ICD-10-CM | POA: Diagnosis not present

## 2018-01-28 DIAGNOSIS — Z79891 Long term (current) use of opiate analgesic: Secondary | ICD-10-CM | POA: Diagnosis not present

## 2018-02-03 ENCOUNTER — Ambulatory Visit (INDEPENDENT_AMBULATORY_CARE_PROVIDER_SITE_OTHER): Payer: BLUE CROSS/BLUE SHIELD

## 2018-02-03 ENCOUNTER — Ambulatory Visit (INDEPENDENT_AMBULATORY_CARE_PROVIDER_SITE_OTHER): Payer: BLUE CROSS/BLUE SHIELD | Admitting: Podiatry

## 2018-02-03 DIAGNOSIS — M79671 Pain in right foot: Secondary | ICD-10-CM

## 2018-02-03 DIAGNOSIS — M722 Plantar fascial fibromatosis: Secondary | ICD-10-CM

## 2018-02-03 DIAGNOSIS — M79672 Pain in left foot: Secondary | ICD-10-CM

## 2018-02-03 DIAGNOSIS — G5761 Lesion of plantar nerve, right lower limb: Secondary | ICD-10-CM

## 2018-02-03 DIAGNOSIS — G5763 Lesion of plantar nerve, bilateral lower limbs: Secondary | ICD-10-CM

## 2018-02-03 DIAGNOSIS — M729 Fibroblastic disorder, unspecified: Secondary | ICD-10-CM | POA: Diagnosis not present

## 2018-02-03 DIAGNOSIS — G5762 Lesion of plantar nerve, left lower limb: Secondary | ICD-10-CM

## 2018-02-03 NOTE — Patient Instructions (Signed)

## 2018-02-03 NOTE — Progress Notes (Signed)
   Subjective:    Patient ID: Desiree Russell, female    DOB: 1971-02-25, 47 y.o.   MRN: [id number]  HPI    Review of Systems  Musculoskeletal: Positive for arthralgias and myalgias.  All other systems reviewed and are negative.      Objective:   Physical Exam        Assessment & Plan:

## 2018-02-04 DIAGNOSIS — F339 Major depressive disorder, recurrent, unspecified: Secondary | ICD-10-CM | POA: Diagnosis not present

## 2018-02-04 DIAGNOSIS — E669 Obesity, unspecified: Secondary | ICD-10-CM | POA: Diagnosis not present

## 2018-02-04 DIAGNOSIS — M2550 Pain in unspecified joint: Secondary | ICD-10-CM | POA: Diagnosis not present

## 2018-02-04 DIAGNOSIS — G5603 Carpal tunnel syndrome, bilateral upper limbs: Secondary | ICD-10-CM | POA: Diagnosis not present

## 2018-02-18 NOTE — Progress Notes (Signed)
Subjective:  Patient ID: Desiree Russell, female    DOB: 16-Jan-1971,  MRN: [id number]  Chief Complaint  Patient presents with  . Foot Pain    B/L plantar forefoot and bottom heels x 3 wks; 5/10 sharp constant pain -no injury Tx: ibuprofen, and epsom salt -pt states shes had buning and numbness in the past few months   47 y.o. female presents with the above complaint.  Reports pain to the bottom of both feet in the forefoot area.  For 3 weeks.  5 out of 10 sharp constant pain denies injury.  Has been using ibuprofen Epsom salts reports burning and numbness.  Past Medical History:  Diagnosis Date  . Anxiety   . Fibromyalgia   . Palpitations    Past Surgical History:  Procedure Laterality Date  . ABDOMINAL HYSTERECTOMY      Current Outpatient Medications:  .  albuterol (VENTOLIN HFA) 108 (90 Base) MCG/ACT inhaler, Inhale 2 puffs into the lungs every 4 (four) hours as needed for wheezing., Disp: , Rfl:  .  ALPRAZolam (XANAX) 0.5 MG tablet, Take 0.5 mg by mouth every 6 (six) hours as needed for anxiety., Disp: , Rfl:  .  aspirin EC 81 MG tablet, Take 1 tablet by mouth daily., Disp: , Rfl:  .  B Complex Vitamins (VITAMIN B COMPLEX PO), Take 1 capsule by mouth daily., Disp: , Rfl:  .  cetirizine (ZYRTEC) 10 MG tablet, Take 1 tablet by mouth daily., Disp: , Rfl:  .  Cholecalciferol (VITAMIN D3) 1000 units CAPS, Take 1 tablet by mouth daily., Disp: , Rfl:  .  clopidogrel (PLAVIX) 75 MG tablet, Take 75 mg by mouth at bedtime., Disp: , Rfl:  .  Colchicine 0.6 MG CAPS, Take 1 capsule by mouth 2 (two) times daily., Disp: , Rfl:  .  Cranberry-Vitamin C-Probiotic (AZO CRANBERRY PO), Take 2 capsules by mouth daily., Disp: , Rfl:  .  Cyanocobalamin (VITAMIN B-12 IJ), Inject 1,000 mcg as directed every 30 (thirty) days. Take on the first of every month, Disp: , Rfl:  .  diclofenac sodium (VOLTAREN) 1 % GEL, APPLY TO CHEST TWO TIMES DAILY AS NEEDED, Disp: , Rfl:  .  DULoxetine (CYMBALTA) 60 MG capsule,  Take 2 capsules by mouth daily., Disp: , Rfl:  .  ergocalciferol (VITAMIN D2) 50000 UNITS capsule, Take 50,000 Units by mouth once a week. Take on Tuesdays and Fridays per patient, Disp: , Rfl:  .  FLUoxetine (PROZAC) 40 MG capsule, Take 40 mg by mouth at bedtime., Disp: , Rfl:  .  Homeopathic Products (AZO YEAST PLUS PO), Take 1-2 capsules by mouth daily., Disp: , Rfl:  .  HYDROcodone-acetaminophen (NORCO/VICODIN) 5-325 MG per tablet, Take 1 tablet by mouth every 6 (six) hours as needed for moderate pain., Disp: , Rfl:  .  metoprolol (LOPRESSOR) 50 MG tablet, Take 25 mg by mouth 2 (two) times daily., Disp: , Rfl:  .  Multiple Vitamin (MULTIVITAMIN WITH MINERALS) TABS tablet, Take 1 tablet by mouth daily., Disp: , Rfl:  .  pregabalin (LYRICA) 200 MG capsule, Take 200 mg by mouth 2 (two) times daily., Disp: , Rfl:  .  promethazine (PHENERGAN) 25 MG tablet, Take 25 mg by mouth every 6 (six) hours as needed for nausea or vomiting., Disp: , Rfl:  .  sennosides-docusate sodium (SENOKOT-S) 8.6-50 MG tablet, Take 12 tablets by mouth daily as needed for constipation., Disp: , Rfl:  .  SUMAtriptan (IMITREX) 100 MG tablet, Take 100 mg by mouth every  2 (two) hours as needed for migraine. May repeat in 2 hours if headache persists or recurs., Disp: , Rfl:  .  topiramate (TOPAMAX) 50 MG tablet, Take 50 mg by mouth 2 (two) times daily., Disp: , Rfl:  .  traMADol (ULTRAM) 50 MG tablet, Take 50 mg by mouth 4 (four) times daily. Take every day per patient, Disp: , Rfl:  .  traZODone (DESYREL) 50 MG tablet, Take 50-150 mg by mouth at bedtime as needed for sleep., Disp: , Rfl:   Allergies  Allergen Reactions  . Morphine And Related Hives  . Oxycodone Hives  . Chlorhexidine Hives    rash rash rash rash   . Codeine     Hives    Review of Systems: Negative except as noted in the HPI. Denies N/V/F/Ch. Objective:  There were no vitals filed for this visit. General AA&O x3. Normal mood and affect.  Vascular  Dorsalis pedis and posterior tibial pulses  present 2+ bilaterally  Capillary refill normal to all digits. Pedal hair growth normal.  Neurologic Epicritic sensation grossly present.  Dermatologic No open lesions. Interspaces clear of maceration. Nails well groomed and normal in appearance.  Orthopedic: MMT 5/5 in dorsiflexion, plantarflexion, inversion, and eversion. Normal joint ROM without pain or crepitus. POP medial calc tuber bilat. POP 3rd interspace bilat with mulder's click.   Assessment & Plan:  Patient was evaluated and treated and all questions answered.  Plantar Fasciitis, bilaterally - XR reviewed as above.  - Educated on icing and stretching. Instructions given.  - Injection delivered to the plantar fascia as below. - Night splint dispensed.  Procedure: Injection Tendon/Ligament Location: Bilateral plantar fascia at the glabrous junction; medial approach. Skin Prep: Alcohol. Injectate: 1 cc 0.5% marcaine plain, 1 cc dexamethasone phosphate, 0.5 cc kenalog 10. Disposition: Patient tolerated procedure well. Injection site dressed with a band-aid.  No follow-ups on file.

## 2018-02-19 DIAGNOSIS — R200 Anesthesia of skin: Secondary | ICD-10-CM | POA: Diagnosis not present

## 2018-02-19 DIAGNOSIS — R52 Pain, unspecified: Secondary | ICD-10-CM | POA: Diagnosis not present

## 2018-02-24 ENCOUNTER — Ambulatory Visit (INDEPENDENT_AMBULATORY_CARE_PROVIDER_SITE_OTHER): Payer: BLUE CROSS/BLUE SHIELD | Admitting: Podiatry

## 2018-02-24 ENCOUNTER — Other Ambulatory Visit: Payer: Self-pay | Admitting: *Deleted

## 2018-02-24 ENCOUNTER — Encounter: Payer: Self-pay | Admitting: Neurology

## 2018-02-24 DIAGNOSIS — M79672 Pain in left foot: Secondary | ICD-10-CM

## 2018-02-24 DIAGNOSIS — G5762 Lesion of plantar nerve, left lower limb: Secondary | ICD-10-CM | POA: Diagnosis not present

## 2018-02-24 DIAGNOSIS — M722 Plantar fascial fibromatosis: Secondary | ICD-10-CM | POA: Diagnosis not present

## 2018-02-24 DIAGNOSIS — M79671 Pain in right foot: Secondary | ICD-10-CM | POA: Diagnosis not present

## 2018-02-24 DIAGNOSIS — G5761 Lesion of plantar nerve, right lower limb: Secondary | ICD-10-CM

## 2018-02-24 DIAGNOSIS — R52 Pain, unspecified: Secondary | ICD-10-CM

## 2018-02-25 NOTE — Progress Notes (Signed)
  Subjective:  Patient ID: Desiree Russell, female    DOB: [birth year],  MRN: [id number]  Chief Complaint  Patient presents with  . Plantar Fasciitis    F/U B/L PF Pt. stated," antyhign touching the bottom of my R foot hurts and burns; 6/10." The L foot is doing much better." tx: night spling (helps) and stretching   47 y.o. female returns for the above complaint. Left foot is doing much better.  Objective:  There were no vitals filed for this visit. General AA&O x3. Normal mood and affect.  Vascular Pedal pulses palpable.  Neurologic Epicritic sensation grossly intact.  Dermatologic No open lesions. Skin normal texture and turgor.  Orthopedic: POP R medial calc tuber. No pain L POP R 3rd IS with click. No pain L but still with click.   Assessment & Plan:  Patient was evaluated and treated and all questions answered.  Plantar Fasciitis, Bilat -Improved L -Still having pain R -Injection as below. -Awaiting orthotics.  Procedure: Injection Tendon/Ligament Consent: Verbal consent obtained. Location: Right plantar fascia at the glabrous junction; medial approach. Skin Prep: Alcohol. Injectate: 1 cc 0.5% marcaine plain, 1 cc dexamethasone phosphate, 0.5 cc kenalog 10. Disposition: Patient tolerated procedure well. Injection site dressed with a band-aid.  Morton Neuroma, Bilat -Improved L -Still having pain R -Injection as below R  Procedure: Neuroma Injection Location: Right 3rd interspace Skin Prep: Alcohol. Injectate: 0.5 cc 0.5% marcaine plain, 0.5 cc dexamethasone phosphate. Disposition: Patient tolerated procedure well. Injection site dressed with a band-aid.  No follow-ups on file.

## 2018-03-04 DIAGNOSIS — G8929 Other chronic pain: Secondary | ICD-10-CM | POA: Diagnosis not present

## 2018-03-04 DIAGNOSIS — M542 Cervicalgia: Secondary | ICD-10-CM | POA: Diagnosis not present

## 2018-03-04 DIAGNOSIS — M7918 Myalgia, other site: Secondary | ICD-10-CM | POA: Diagnosis not present

## 2018-03-04 DIAGNOSIS — M545 Low back pain: Secondary | ICD-10-CM | POA: Diagnosis not present

## 2018-03-06 ENCOUNTER — Encounter: Payer: Self-pay | Admitting: Neurology

## 2018-03-06 DIAGNOSIS — R531 Weakness: Secondary | ICD-10-CM | POA: Diagnosis not present

## 2018-03-06 DIAGNOSIS — G968 Other specified disorders of central nervous system: Secondary | ICD-10-CM | POA: Diagnosis not present

## 2018-03-06 DIAGNOSIS — R110 Nausea: Secondary | ICD-10-CM | POA: Diagnosis not present

## 2018-03-06 DIAGNOSIS — R000 Tachycardia, unspecified: Secondary | ICD-10-CM | POA: Diagnosis not present

## 2018-03-06 DIAGNOSIS — R51 Headache: Secondary | ICD-10-CM | POA: Diagnosis not present

## 2018-03-06 DIAGNOSIS — R404 Transient alteration of awareness: Secondary | ICD-10-CM | POA: Diagnosis not present

## 2018-03-06 DIAGNOSIS — H53149 Visual discomfort, unspecified: Secondary | ICD-10-CM | POA: Diagnosis not present

## 2018-03-06 DIAGNOSIS — R29810 Facial weakness: Secondary | ICD-10-CM | POA: Diagnosis not present

## 2018-03-06 DIAGNOSIS — G43009 Migraine without aura, not intractable, without status migrainosus: Secondary | ICD-10-CM | POA: Diagnosis not present

## 2018-03-06 DIAGNOSIS — R4020 Unspecified coma: Secondary | ICD-10-CM | POA: Diagnosis not present

## 2018-03-13 ENCOUNTER — Other Ambulatory Visit: Payer: Self-pay | Admitting: Podiatry

## 2018-03-13 DIAGNOSIS — M79672 Pain in left foot: Secondary | ICD-10-CM

## 2018-03-13 DIAGNOSIS — M722 Plantar fascial fibromatosis: Secondary | ICD-10-CM

## 2018-03-13 DIAGNOSIS — G5762 Lesion of plantar nerve, left lower limb: Secondary | ICD-10-CM

## 2018-03-13 DIAGNOSIS — G5761 Lesion of plantar nerve, right lower limb: Secondary | ICD-10-CM

## 2018-03-13 DIAGNOSIS — M79671 Pain in right foot: Secondary | ICD-10-CM

## 2018-03-13 DIAGNOSIS — M729 Fibroblastic disorder, unspecified: Secondary | ICD-10-CM

## 2018-03-14 ENCOUNTER — Encounter: Payer: Self-pay | Admitting: Neurology

## 2018-03-14 ENCOUNTER — Ambulatory Visit (INDEPENDENT_AMBULATORY_CARE_PROVIDER_SITE_OTHER): Payer: BLUE CROSS/BLUE SHIELD | Admitting: Neurology

## 2018-03-14 ENCOUNTER — Other Ambulatory Visit: Payer: Self-pay

## 2018-03-14 VITALS — BP 139/93 | HR 102 | Resp 18 | Ht 64.0 in | Wt 208.0 lb

## 2018-03-14 DIAGNOSIS — G43709 Chronic migraine without aura, not intractable, without status migrainosus: Secondary | ICD-10-CM

## 2018-03-14 DIAGNOSIS — M542 Cervicalgia: Secondary | ICD-10-CM | POA: Diagnosis not present

## 2018-03-14 DIAGNOSIS — R531 Weakness: Secondary | ICD-10-CM | POA: Diagnosis not present

## 2018-03-14 DIAGNOSIS — R002 Palpitations: Secondary | ICD-10-CM

## 2018-03-14 DIAGNOSIS — I6330 Cerebral infarction due to thrombosis of unspecified cerebral artery: Secondary | ICD-10-CM | POA: Diagnosis not present

## 2018-03-14 DIAGNOSIS — R4020 Unspecified coma: Secondary | ICD-10-CM

## 2018-03-14 DIAGNOSIS — G43119 Migraine with aura, intractable, without status migrainosus: Secondary | ICD-10-CM | POA: Insufficient documentation

## 2018-03-14 DIAGNOSIS — IMO0002 Reserved for concepts with insufficient information to code with codable children: Secondary | ICD-10-CM

## 2018-03-14 MED ORDER — GALCANEZUMAB-GNLM 120 MG/ML ~~LOC~~ SOAJ: 1.0000 "pen " | SUBCUTANEOUS | 4 refills | Status: DC

## 2018-03-14 NOTE — Progress Notes (Signed)
[facility]  PATIENT: Ima Hafner DOB: [birth year]  REFERRING DOCTOR OR PCP:  Marco Collie, MD SOURCE: Patient, multiple notes from [facility] and [place], imaging and lab results, EEG results, MRI images personally reviewed.  _________________________________   HISTORICAL  CHIEF COMPLAINT:  Chief Complaint  Patient presents with  . Migraine    Mackensi is here with her husband, Pilar Plate, for eval of daily h/a's occ. associated with  speech disturbance, right sided weakness, left facial droop, syncope.  Seen in the ER on 03/06/18 where CT r/o CVA; dx. with complicated migraines and referred to neurology./fim    HISTORY OF PRESENT ILLNESS:  I saw your patient, Qamar Rosman, and [facility] for a neurologic consultation regarding her recent symptoms with right-sided weakness and numbness.  She has had daily constant headaches, right > left.  She has neck pain, right greater than left with the most intense pain at the occiput.  Pain starts in the back of the head and radiates forward.   Position and movements do not change the intensity much.    She notes vision is poor.      She has spells where the right side of her body goes numb and weak at least a few times a week associated with a migraine.  The spells can occur once or twice a week but usually resolve over hour or so.  She had an especially severe 1 recently and she reports still being symptomatic.     She was driving 12/04/9043 on the phone with her husband at that time when the headache intensified and she noted more trouble with her vision and right sided weakness.   She also has had a halting speech pattern since then.  She pulled over and blacked out and when she woke up the paramedics were there.   The husband called 911 so she was likely out 10 minutes or so.   She went to the [facility] ED and had an MRI showing old cerebellar strokes but no change compared to other MRI's.    Although she has had the  spells of numbness and weakness in the past, this was reportedly the first episdoe with LOC since 2006.    She has been treated for migraine for many years.  She has been on topiramate x many years but does not think it has helped the headaches much.    She takes Maxalt when a HA occurs but it has not helped as much lately.    Headaches occur 30/30 days a month and she has migrainous features with them > 15 days a month.  She goes to her doctors officce if a migraine lasts > 2-3 days for a shot.     HA's last for more than 3 days in a row.    I reviewed the chart and she has seen multiple neurologists, cardiologists and psychiatrists over past few years multiple institutions (some Dr. Metta Clines, Dr. Jimmey Ralph notes were reviewed).    She has had spells of altered consciousness, somaticization, pseudoseizures.  She also has anxiety and depression.     I reviewed the chart and she has seen multiple neurologists, cardiologists and psychiatrists over past few years multiple institutions (some Dr. Metta Clines, Dr. Jimmey Ralph notes were reviewed).    She has had spells of altered consciousness, somaticization, pseudoseizures.  She also has anxiety and depression.   Doing an EEG 06/08/2016, she had jerking during photic stimulation consistent with pseudoseizure as there was not underlying  epileptiform discharges.    I personally reviewed the MRI of the brain 03/06/2018.  It showed some foci in the left greater than right that could represent chronic lacunar infarctions and were present on the previous MRI as well.  Of note, the vertebrobasilar system diminutive though there does appear to be prominent posterior communicating arteries.  She has not had MRA or CTA of the brain or neck..  She has had pericarditis and tachycardia.   She is on metoprolol 50 mg po bid.   She needed a cardioversion.    REVIEW OF SYSTEMS: Constitutional: No fevers, chills, sweats, or change in appetite Eyes: No visual changes,  double vision, eye pain Ear, nose and throat: No hearing loss, ear pain, nasal congestion, sore throat Cardiovascular: No chest pain, palpitations Respiratory: No shortness of breath at rest or with exertion.   No wheezes GastrointestinaI: No nausea, vomiting, diarrhea, abdominal pain, fecal incontinence Genitourinary: No dysuria, urinary retention or frequency.  No nocturia. Musculoskeletal: No neck pain, back pain Integumentary: No rash, pruritus, skin lesions Neurological: as above Psychiatric: History of depression and anxiety and pseudoseizures Endocrine: No palpitations, diaphoresis, change in appetite, change in weigh or increased thirst Hematologic/Lymphatic: No anemia, purpura, petechiae. Allergic/Immunologic: No itchy/runny eyes, nasal congestion, recent allergic reactions, rashes  ALLERGIES: Allergies  Allergen Reactions  . Morphine And Related Hives  . Oxycodone Hives  . Chlorhexidine Hives    rash rash rash rash   . Codeine     Hives     HOME MEDICATIONS:  Current Outpatient Medications:  .  albuterol (VENTOLIN HFA) 108 (90 Base) MCG/ACT inhaler, Inhale 2 puffs into the lungs every 4 (four) hours as needed for wheezing., Disp: , Rfl:  .  ALPRAZolam (XANAX) 0.5 MG tablet, Take 0.5 mg by mouth every 6 (six) hours as needed for anxiety., Disp: , Rfl:  .  aspirin EC 81 MG tablet, Take 1 tablet by mouth daily., Disp: , Rfl:  .  B Complex Vitamins (VITAMIN B COMPLEX PO), Take 1 capsule by mouth daily., Disp: , Rfl:  .  cetirizine (ZYRTEC) 10 MG tablet, Take 1 tablet by mouth daily., Disp: , Rfl:  .  Cholecalciferol (VITAMIN D3) 1000 units CAPS, Take 1 tablet by mouth daily., Disp: , Rfl:  .  DULoxetine (CYMBALTA) 60 MG capsule, Take 2 capsules by mouth daily., Disp: , Rfl:  .  metoprolol (LOPRESSOR) 50 MG tablet, Take 50 mg by mouth 2 (two) times daily. , Disp: , Rfl:  .  sennosides-docusate sodium (SENOKOT-S) 8.6-50 MG tablet, Take 12 tablets by mouth daily as  needed for constipation., Disp: , Rfl:  .  clopidogrel (PLAVIX) 75 MG tablet, Take 75 mg by mouth at bedtime., Disp: , Rfl:  .  Colchicine 0.6 MG CAPS, Take 1 capsule by mouth 2 (two) times daily., Disp: , Rfl:  .  Cranberry-Vitamin C-Probiotic (AZO CRANBERRY PO), Take 2 capsules by mouth daily., Disp: , Rfl:  .  Cyanocobalamin (VITAMIN B-12 IJ), Inject 1,000 mcg as directed every 30 (thirty) days. Take on the first of every month, Disp: , Rfl:  .  diclofenac sodium (VOLTAREN) 1 % GEL, APPLY TO CHEST TWO TIMES DAILY AS NEEDED, Disp: , Rfl:   PAST MEDICAL HISTORY: Past Medical History:  Diagnosis Date  . Anxiety   . Fibromyalgia   . Headache   . Palpitations   . Seizures ([place])     PAST SURGICAL HISTORY: Past Surgical History:  Procedure Laterality Date  . ABDOMINAL HYSTERECTOMY    .  CHOLECYSTECTOMY, LAPAROSCOPIC      FAMILY HISTORY: Family History  Problem Relation Age of Onset  . Hypertension Mother   . Hypertension Father   . Heart attack Father   . Healthy Sister   . Diabetes type II Brother   . Hypertension Maternal Grandmother   . Diabetes Paternal Grandmother   . Healthy Sister   . Healthy Sister   . COPD Brother   . Healthy Brother   . Healthy Brother     SOCIAL HISTORY:  Social History   Socioeconomic History  . Marital status: Divorced    Spouse name: Not on file  . Number of children: Not on file  . Years of education: Not on file  . Highest education level: Not on file  Occupational History  . Not on file  Social Needs  . Financial resource strain: Not on file  . Food insecurity:    Worry: Not on file    Inability: Not on file  . Transportation needs:    Medical: Not on file    Non-medical: Not on file  Tobacco Use  . Smoking status: Never Smoker  . Smokeless tobacco: Never Used  Substance and Sexual Activity  . Alcohol use: Yes  . Drug use: No  . Sexual activity: Not on file  Lifestyle  . Physical activity:    Days per week: Not on file     Minutes per session: Not on file  . Stress: Not on file  Relationships  . Social connections:    Talks on phone: Not on file    Gets together: Not on file    Attends religious service: Not on file    Active member of club or organization: Not on file    Attends meetings of clubs or organizations: Not on file    Relationship status: Not on file  . Intimate partner violence:    Fear of current or ex partner: Not on file    Emotionally abused: Not on file    Physically abused: Not on file    Forced sexual activity: Not on file  Other Topics Concern  . Not on file  Social History Narrative  . Not on file     PHYSICAL EXAM  Vitals:   03/14/18 1033  BP: (!) 139/93  Pulse: (!) 102  Resp: 18  Weight: 208 lb (94.3 kg)  Height: 5\' 4"  (1.626 m)    Body mass index is 35.7 kg/m.   General: The patient is well-developed and well-nourished and in no acute distress  Eyes:  Funduscopic exam shows normal optic discs and retinal vessels.  Neck: The neck is supple, no carotid bruits are noted.  The neck is nontender.  Cardiovascular: The heart has a regular rate and rhythm with a normal S1 and S2. There were no murmurs, gallops or rubs.    Skin: Extremities are without significant edema.  Musculoskeletal:  Back is nontender  Neurologic Exam  Mental status: The patient is alert and oriented x 3 at the time of the examination. The patient has apparent normal recent and remote memory, with an apparently normal attention span and concentration ability.   Speech is normal.  Cranial nerves: Extraocular movements are full. Pupils are equal, round, and reactive to light and accomodation.     Facial symmetry is present. There is good facial sensation to soft touch bilaterally.Facial strength is normal.  Trapezius and sternocleidomastoid strength is normal. No dysarthria is noted.  The tongue is midline, and the  patient has symmetric elevation of the soft palate. No obvious hearing  deficits are noted.  Motor:  Muscle bulk is normal.   Tone is normal. Strength is  5 / 5 in all 4 extremities.   Sensory: Sensory testing shows symmetric sensation to temperature and vibration in the arms and legs but she reports that touch sensation is markedly reduced on the right versus the left.  Coordination: Cerebellar testing reveals normal left and slow but accurate right finger-nose-finger and heel-to-shin .  Gait and station: Station is normal.   Gait is near normal.  Her tandem gait was wide.. Romberg is negative.   Reflexes: Deep tendon reflexes are symmetric and normal bilaterally.   Plantar responses are flexor.    DIAGNOSTIC DATA (LABS, IMAGING, TESTING) - I reviewed patient records, labs, notes, testing and imaging myself where available.  Lab Results  Component Value Date   WBC 3.9 (L) 12/12/2014   HGB 12.4 12/12/2014   HCT 37.8 12/12/2014   MCV 88.3 12/12/2014   PLT 184 12/12/2014      Component Value Date/Time   NA 137 12/12/2014 1747   K 4.0 12/12/2014 1747   CL 107 12/12/2014 1747   CO2 24 12/12/2014 1747   GLUCOSE 90 12/12/2014 1747   BUN 10 12/12/2014 1747   CREATININE 1.02 (H) 12/12/2014 1747   CALCIUM 8.8 (L) 12/12/2014 1747   GFRNONAA >60 12/12/2014 1747   GFRAA >60 12/12/2014 1747       ASSESSMENT AND PLAN  Cerebral infarction due to thrombosis of cerebral artery ([place]) - Plan: MR MRA HEAD WO CONTRAST, MR MRA NECK W WO CONTRAST  Cervicalgia - Plan: MR CERVICAL SPINE WO CONTRAST  Right sided weakness - Plan: MR MRA HEAD WO CONTRAST, MR MRA NECK W WO CONTRAST  Palpitations  Chronic migraine  Loss of consciousness ([place]) - Plan: EEG adult   In summary, Koraline Phillipson is a 47 year old woman who has a one-week history of right-sided symptoms that reportedly followed an episode of loss of consciousness.  I personally reviewed the MRI there are no acute findings.  However, she does have chronic lacunar strokes in the inferior cerebellar  hemispheres.  Complicating her history, there has been some some somatization in the past.    Because of the reported loss of consciousness and her abnormal MRI with chronic changes in the cerebellum, we need to check an MR angiogram of the brain and neck to determine if there are any foci of significant stenosis.  We will also check an EEG to determine if there is any epileptiform activity.  This also can help to diagnose pseudoseizure.    She has neck pain and her gait was wide.  Therefore, we need to check the MRI of the cervical spine to make sure that there is not any spinal cord compression.  Headache that she is experiencing right now we have her get Toradol 60 mg, Phenergan 25 mg of Decadron 4 mg IM.  We will call her with the results of the studies and scheduled follow-up as needed.  They should call if any significant new or worsening neurologic symptoms.   Nahima Ales A. Felecia Shelling, MD, [facility] 5/99/3570, 17:79 AM Certified in Neurology, Clinical Neurophysiology, Sleep Medicine, Pain Medicine and Neuroimaging  [facility] [address] [place], [place] [address] [phone]

## 2018-03-17 ENCOUNTER — Telehealth: Payer: Self-pay | Admitting: *Deleted

## 2018-03-17 ENCOUNTER — Telehealth: Payer: Self-pay | Admitting: Neurology

## 2018-03-17 ENCOUNTER — Ambulatory Visit: Payer: BLUE CROSS/BLUE SHIELD | Admitting: Podiatry

## 2018-03-17 DIAGNOSIS — R51 Headache: Secondary | ICD-10-CM | POA: Diagnosis not present

## 2018-03-17 DIAGNOSIS — R531 Weakness: Secondary | ICD-10-CM | POA: Diagnosis not present

## 2018-03-17 DIAGNOSIS — G43909 Migraine, unspecified, not intractable, without status migrainosus: Secondary | ICD-10-CM | POA: Diagnosis not present

## 2018-03-17 DIAGNOSIS — R404 Transient alteration of awareness: Secondary | ICD-10-CM | POA: Diagnosis not present

## 2018-03-17 DIAGNOSIS — G43409 Hemiplegic migraine, not intractable, without status migrainosus: Secondary | ICD-10-CM | POA: Diagnosis not present

## 2018-03-17 DIAGNOSIS — G8191 Hemiplegia, unspecified affecting right dominant side: Secondary | ICD-10-CM | POA: Diagnosis not present

## 2018-03-17 NOTE — Telephone Encounter (Signed)
Noted/fim 

## 2018-03-17 NOTE — Telephone Encounter (Signed)
eft message for pt to call me back  [employer] Auth: [place] via [employer]

## 2018-03-17 NOTE — Telephone Encounter (Signed)
Patient in the ED with similar symptoms at [facility]. They are getting mri brain, mra head and neck. thanks

## 2018-03-17 NOTE — Telephone Encounter (Signed)
PA for Emgality 120mg /ml, #1/30 completed via Cover My Meds.  Key# [id number].  Dx: Chronic Migraine (G43.709) Tried and failed meds: Topamax, Maxalt, Xanax, ASA, Tylenol, Diclofenac, Gabapentin, Baclofen, Cymbalta, Metoprolol, Prozac, Imitrex, Hydrocodone, Lyrica, Tramadol/fim

## 2018-03-24 ENCOUNTER — Encounter: Payer: Self-pay | Admitting: Neurology

## 2018-03-24 ENCOUNTER — Telehealth: Payer: Self-pay | Admitting: *Deleted

## 2018-03-24 ENCOUNTER — Ambulatory Visit (INDEPENDENT_AMBULATORY_CARE_PROVIDER_SITE_OTHER): Payer: BLUE CROSS/BLUE SHIELD | Admitting: Neurology

## 2018-03-24 DIAGNOSIS — R569 Unspecified convulsions: Secondary | ICD-10-CM

## 2018-03-24 DIAGNOSIS — R4020 Unspecified coma: Secondary | ICD-10-CM

## 2018-03-24 DIAGNOSIS — F445 Conversion disorder with seizures or convulsions: Secondary | ICD-10-CM | POA: Diagnosis not present

## 2018-03-24 NOTE — Telephone Encounter (Signed)
Pt. had EEG in office today. Stopped at checkout, at 1245, in w/c,  and wanted to speak with me.  I was in the process of getting Dr. Garth Bigness 1pm pt. back, so I pushed her to the waiting room and advised I would come back to speak with her as soon as possible. She indicated, by texting on her phone, that she was not able to speak. I came back to speak with her and her husband about 15 min. later.  Dr. Felecia Shelling had given pt. a work note for 03/06/18 thru 03/17/18. Pt.'s husband sts. pt. still has not returned to work, and would like excuse extended until EEG results are back. I have explained that, for pt's c/o h/a's, Dr. Felecia Shelling does not have sufficient documentation (has only seen her once), to keep her out of work for such an extended period of time. At this point, pt. was able to begin speaking, sts. since last ov, she had ? syncope and was seen in the ER. I have explained that the physician who saw her may be able to provide a work note based on their evaluation.  I offered note stating she was in our office for testing today. Husband accepted note, but Hinton Dyer at check in reports that, on the way out of the office, he appeared to ball it up and that wife stated, "this office is full of shit."/fim

## 2018-03-24 NOTE — Progress Notes (Signed)
   [facility]  EEG (ELECTROENCEPHALOGRAM) REPORT   STUDY DATE: 03/24/2018 PATIENT NAME: Desiree Russell DOB: Sep 18, 1970 MRN: [id number]  ORDERING CLINICIAN: Rosa Wyly A. Felecia Shelling, MD. PhD  TECHNOLOGIST: Oneita Jolly TECHNIQUE: Electroencephalogram was recorded utilizing standard 10-20 system of lead placement and reformatted into average and bipolar montages.  RECORDING TIME: 56 minutes  CLINICAL INFORMATION: 47 year old woman with episodes of altered consciousness  FINDINGS: A digital EEG was performed while the patient was awake and drowsy. While awake and most alert there was a 10 hz posterior dominant rhythm. Voltages and frequencies were symmetric.  There were no focal, lateralizing, epileptiform activity or seizures seen.  Photic stimulation had a normal driving response. Hyperventilation and recovery did not change the underlying rhythms. EKG channel shows normal sinus rhythm.   After the patient was told that the study was over but before electrodes were removed, she had an event where she became less responsive to the EEG technologist and to her husband.  During that time, EEG was normal.  Specifically, there was no seizure activity.  An additional 25 minutes was recorded during which time the patient had actuating responsiveness.  IMPRESSION: The first half of the study shows a normal EEG while the patient was awake.  The patient then had an episode with altered responsiveness that was not accompanied by abnormal EEG.  This is consistent with a somatization disorder such as pseudoseizures.   INTERPRETING PHYSICIAN:   Arisbeth Purrington A. Felecia Shelling, MD, PhD, [facility] Certified in Neurology, Clinical Neurophysiology, Sleep Medicine, Pain Medicine and Neuroimaging  [facility] [address] [place], [place] [address] [phone]

## 2018-03-24 NOTE — Telephone Encounter (Signed)
Spoke with pt. and per v/o RAS, advised EEG done in our office today showed no sz. activity when she had her spell.  This would be consistent with pseudoseizures, therefore she should f/u with her psychiatrist.  No further f/u with Dr. Felecia Shelling is necessary, as her spells are not neurologic in nature. She verbalized understanding of same/fim

## 2018-03-25 DIAGNOSIS — Z6834 Body mass index (BMI) 34.0-34.9, adult: Secondary | ICD-10-CM | POA: Diagnosis not present

## 2018-03-25 DIAGNOSIS — I6330 Cerebral infarction due to thrombosis of unspecified cerebral artery: Secondary | ICD-10-CM | POA: Diagnosis not present

## 2018-03-25 DIAGNOSIS — G43909 Migraine, unspecified, not intractable, without status migrainosus: Secondary | ICD-10-CM | POA: Diagnosis not present

## 2018-03-25 DIAGNOSIS — E669 Obesity, unspecified: Secondary | ICD-10-CM | POA: Diagnosis not present

## 2018-03-28 DIAGNOSIS — Z6834 Body mass index (BMI) 34.0-34.9, adult: Secondary | ICD-10-CM | POA: Diagnosis not present

## 2018-03-28 DIAGNOSIS — G43401 Hemiplegic migraine, not intractable, with status migrainosus: Secondary | ICD-10-CM | POA: Diagnosis not present

## 2018-04-02 ENCOUNTER — Ambulatory Visit: Payer: BLUE CROSS/BLUE SHIELD | Admitting: *Deleted

## 2018-04-02 DIAGNOSIS — M722 Plantar fascial fibromatosis: Secondary | ICD-10-CM

## 2018-04-02 DIAGNOSIS — G5763 Lesion of plantar nerve, bilateral lower limbs: Secondary | ICD-10-CM

## 2018-04-02 NOTE — Patient Instructions (Signed)

## 2018-04-02 NOTE — Progress Notes (Signed)
Patient ID: Desiree Russell, female   DOB: [birth year], 47 y.o.   MRN: [id number]  Patient presents for orthotic pick up.  Verbal and written break in and wear instructions given.  Patient will follow up with Dr. March Rummage in 4 weeks if symptoms worsen or fail to improve.

## 2018-04-03 NOTE — Telephone Encounter (Signed)
Emgality PA approved by [facility] for dates 03/17/18 thru 06/14/18/fim

## 2018-05-09 DIAGNOSIS — G43119 Migraine with aura, intractable, without status migrainosus: Secondary | ICD-10-CM | POA: Insufficient documentation

## 2018-05-09 DIAGNOSIS — R531 Weakness: Secondary | ICD-10-CM | POA: Diagnosis not present

## 2018-06-03 ENCOUNTER — Telehealth: Payer: Self-pay | Admitting: *Deleted

## 2018-06-03 NOTE — Telephone Encounter (Signed)
PA for Emgality 120mg /ml inj. completed via Cover My Meds. Continuation of coverage. Dx: Chronic Migraine (G43.709). Tried and failed meds: Topamax, Maxalt, Xanax, ASA, Diclofenac, Gabapentin, Baclofen, Cymbalta, Toprol, Prozac, Imitrex, Hydrocodone, Lyrica, Tramadol. Key: Desiree Russell

## 2018-06-04 NOTE — Telephone Encounter (Signed)
Received fax notification from [facility] that [place] approved 06/03/18-06/01/18. Ref#: [id number].   Faxed notice of approval to [facility] at [phone]. Received fax confirmation.

## 2018-08-04 DIAGNOSIS — H6091 Unspecified otitis externa, right ear: Secondary | ICD-10-CM | POA: Diagnosis not present

## 2018-08-04 DIAGNOSIS — J039 Acute tonsillitis, unspecified: Secondary | ICD-10-CM | POA: Diagnosis not present

## 2018-08-04 DIAGNOSIS — R509 Fever, unspecified: Secondary | ICD-10-CM | POA: Diagnosis not present

## 2018-08-04 DIAGNOSIS — Z6831 Body mass index (BMI) 31.0-31.9, adult: Secondary | ICD-10-CM | POA: Diagnosis not present

## 2018-08-21 DIAGNOSIS — N898 Other specified noninflammatory disorders of vagina: Secondary | ICD-10-CM | POA: Diagnosis not present

## 2018-08-21 DIAGNOSIS — N949 Unspecified condition associated with female genital organs and menstrual cycle: Secondary | ICD-10-CM | POA: Diagnosis not present

## 2018-08-21 DIAGNOSIS — Z6832 Body mass index (BMI) 32.0-32.9, adult: Secondary | ICD-10-CM | POA: Diagnosis not present

## 2018-08-25 DIAGNOSIS — A6009 Herpesviral infection of other urogenital tract: Secondary | ICD-10-CM | POA: Diagnosis not present

## 2018-08-25 DIAGNOSIS — Z6832 Body mass index (BMI) 32.0-32.9, adult: Secondary | ICD-10-CM | POA: Diagnosis not present

## 2018-10-14 DIAGNOSIS — I319 Disease of pericardium, unspecified: Secondary | ICD-10-CM | POA: Diagnosis not present

## 2018-10-16 DIAGNOSIS — I340 Nonrheumatic mitral (valve) insufficiency: Secondary | ICD-10-CM

## 2018-10-16 DIAGNOSIS — I313 Pericardial effusion (noninflammatory): Secondary | ICD-10-CM

## 2018-11-25 DIAGNOSIS — L299 Pruritus, unspecified: Secondary | ICD-10-CM | POA: Diagnosis not present

## 2018-11-25 DIAGNOSIS — Z7189 Other specified counseling: Secondary | ICD-10-CM | POA: Diagnosis not present

## 2018-11-25 DIAGNOSIS — R32 Unspecified urinary incontinence: Secondary | ICD-10-CM | POA: Diagnosis not present

## 2018-11-25 DIAGNOSIS — R1031 Right lower quadrant pain: Secondary | ICD-10-CM | POA: Diagnosis not present

## 2018-11-25 DIAGNOSIS — F339 Major depressive disorder, recurrent, unspecified: Secondary | ICD-10-CM | POA: Diagnosis not present

## 2018-11-25 DIAGNOSIS — M797 Fibromyalgia: Secondary | ICD-10-CM | POA: Diagnosis not present

## 2018-11-25 DIAGNOSIS — N390 Urinary tract infection, site not specified: Secondary | ICD-10-CM | POA: Diagnosis not present

## 2018-11-25 DIAGNOSIS — R5383 Other fatigue: Secondary | ICD-10-CM | POA: Diagnosis not present

## 2018-11-25 DIAGNOSIS — R350 Frequency of micturition: Secondary | ICD-10-CM | POA: Diagnosis not present

## 2018-11-27 DIAGNOSIS — N390 Urinary tract infection, site not specified: Secondary | ICD-10-CM | POA: Diagnosis not present

## 2018-12-02 DIAGNOSIS — R05 Cough: Secondary | ICD-10-CM | POA: Diagnosis not present

## 2018-12-02 DIAGNOSIS — Z20828 Contact with and (suspected) exposure to other viral communicable diseases: Secondary | ICD-10-CM | POA: Diagnosis not present

## 2018-12-02 DIAGNOSIS — R509 Fever, unspecified: Secondary | ICD-10-CM | POA: Diagnosis not present

## 2018-12-02 DIAGNOSIS — Z03818 Encounter for observation for suspected exposure to other biological agents ruled out: Secondary | ICD-10-CM | POA: Diagnosis not present

## 2018-12-03 DIAGNOSIS — S99911A Unspecified injury of right ankle, initial encounter: Secondary | ICD-10-CM | POA: Diagnosis not present

## 2018-12-03 DIAGNOSIS — S93401A Sprain of unspecified ligament of right ankle, initial encounter: Secondary | ICD-10-CM | POA: Diagnosis not present

## 2018-12-03 DIAGNOSIS — M7989 Other specified soft tissue disorders: Secondary | ICD-10-CM | POA: Diagnosis not present

## 2018-12-03 DIAGNOSIS — M25571 Pain in right ankle and joints of right foot: Secondary | ICD-10-CM | POA: Diagnosis not present

## 2018-12-08 DIAGNOSIS — R937 Abnormal findings on diagnostic imaging of other parts of musculoskeletal system: Secondary | ICD-10-CM | POA: Diagnosis not present

## 2018-12-08 DIAGNOSIS — R29898 Other symptoms and signs involving the musculoskeletal system: Secondary | ICD-10-CM | POA: Diagnosis not present

## 2018-12-08 DIAGNOSIS — M5137 Other intervertebral disc degeneration, lumbosacral region: Secondary | ICD-10-CM | POA: Diagnosis not present

## 2018-12-10 DIAGNOSIS — R29898 Other symptoms and signs involving the musculoskeletal system: Secondary | ICD-10-CM | POA: Diagnosis not present

## 2018-12-10 DIAGNOSIS — S93401A Sprain of unspecified ligament of right ankle, initial encounter: Secondary | ICD-10-CM | POA: Diagnosis not present

## 2018-12-10 DIAGNOSIS — R937 Abnormal findings on diagnostic imaging of other parts of musculoskeletal system: Secondary | ICD-10-CM | POA: Diagnosis not present

## 2018-12-17 DIAGNOSIS — M25571 Pain in right ankle and joints of right foot: Secondary | ICD-10-CM | POA: Insufficient documentation

## 2018-12-23 DIAGNOSIS — M25572 Pain in left ankle and joints of left foot: Secondary | ICD-10-CM | POA: Insufficient documentation

## 2018-12-23 DIAGNOSIS — M79672 Pain in left foot: Secondary | ICD-10-CM | POA: Insufficient documentation

## 2018-12-23 DIAGNOSIS — M25571 Pain in right ankle and joints of right foot: Secondary | ICD-10-CM | POA: Diagnosis not present

## 2018-12-23 DIAGNOSIS — M79671 Pain in right foot: Secondary | ICD-10-CM | POA: Insufficient documentation

## 2018-12-24 DIAGNOSIS — E782 Mixed hyperlipidemia: Secondary | ICD-10-CM | POA: Diagnosis not present

## 2018-12-24 DIAGNOSIS — Z8679 Personal history of other diseases of the circulatory system: Secondary | ICD-10-CM | POA: Diagnosis not present

## 2018-12-24 DIAGNOSIS — R079 Chest pain, unspecified: Secondary | ICD-10-CM | POA: Insufficient documentation

## 2018-12-27 DIAGNOSIS — R51 Headache: Secondary | ICD-10-CM | POA: Diagnosis not present

## 2019-01-05 DIAGNOSIS — N390 Urinary tract infection, site not specified: Secondary | ICD-10-CM | POA: Diagnosis not present

## 2019-01-06 DIAGNOSIS — M25571 Pain in right ankle and joints of right foot: Secondary | ICD-10-CM | POA: Diagnosis not present

## 2019-01-06 DIAGNOSIS — M79671 Pain in right foot: Secondary | ICD-10-CM | POA: Diagnosis not present

## 2019-01-06 DIAGNOSIS — M25572 Pain in left ankle and joints of left foot: Secondary | ICD-10-CM | POA: Diagnosis not present

## 2019-01-07 DIAGNOSIS — M25571 Pain in right ankle and joints of right foot: Secondary | ICD-10-CM | POA: Diagnosis not present

## 2019-01-07 DIAGNOSIS — M25572 Pain in left ankle and joints of left foot: Secondary | ICD-10-CM | POA: Diagnosis not present

## 2019-01-08 DIAGNOSIS — R262 Difficulty in walking, not elsewhere classified: Secondary | ICD-10-CM | POA: Diagnosis not present

## 2019-01-08 DIAGNOSIS — M25572 Pain in left ankle and joints of left foot: Secondary | ICD-10-CM | POA: Diagnosis not present

## 2019-01-08 DIAGNOSIS — S93401D Sprain of unspecified ligament of right ankle, subsequent encounter: Secondary | ICD-10-CM | POA: Diagnosis not present

## 2019-01-08 DIAGNOSIS — M25571 Pain in right ankle and joints of right foot: Secondary | ICD-10-CM | POA: Diagnosis not present

## 2019-01-12 DIAGNOSIS — M25572 Pain in left ankle and joints of left foot: Secondary | ICD-10-CM | POA: Diagnosis not present

## 2019-01-12 DIAGNOSIS — R262 Difficulty in walking, not elsewhere classified: Secondary | ICD-10-CM | POA: Diagnosis not present

## 2019-01-12 DIAGNOSIS — M25571 Pain in right ankle and joints of right foot: Secondary | ICD-10-CM | POA: Diagnosis not present

## 2019-01-12 DIAGNOSIS — S93401D Sprain of unspecified ligament of right ankle, subsequent encounter: Secondary | ICD-10-CM | POA: Diagnosis not present

## 2019-01-20 DIAGNOSIS — R262 Difficulty in walking, not elsewhere classified: Secondary | ICD-10-CM | POA: Diagnosis not present

## 2019-01-20 DIAGNOSIS — M25571 Pain in right ankle and joints of right foot: Secondary | ICD-10-CM | POA: Diagnosis not present

## 2019-01-20 DIAGNOSIS — S93401D Sprain of unspecified ligament of right ankle, subsequent encounter: Secondary | ICD-10-CM | POA: Diagnosis not present

## 2019-01-20 DIAGNOSIS — M25572 Pain in left ankle and joints of left foot: Secondary | ICD-10-CM | POA: Diagnosis not present

## 2019-01-21 DIAGNOSIS — R262 Difficulty in walking, not elsewhere classified: Secondary | ICD-10-CM | POA: Diagnosis not present

## 2019-01-21 DIAGNOSIS — M25572 Pain in left ankle and joints of left foot: Secondary | ICD-10-CM | POA: Diagnosis not present

## 2019-01-21 DIAGNOSIS — S93401D Sprain of unspecified ligament of right ankle, subsequent encounter: Secondary | ICD-10-CM | POA: Diagnosis not present

## 2019-01-21 DIAGNOSIS — M25571 Pain in right ankle and joints of right foot: Secondary | ICD-10-CM | POA: Diagnosis not present

## 2019-01-23 DIAGNOSIS — S93401D Sprain of unspecified ligament of right ankle, subsequent encounter: Secondary | ICD-10-CM | POA: Diagnosis not present

## 2019-01-23 DIAGNOSIS — R262 Difficulty in walking, not elsewhere classified: Secondary | ICD-10-CM | POA: Diagnosis not present

## 2019-01-23 DIAGNOSIS — M25572 Pain in left ankle and joints of left foot: Secondary | ICD-10-CM | POA: Diagnosis not present

## 2019-01-23 DIAGNOSIS — M25571 Pain in right ankle and joints of right foot: Secondary | ICD-10-CM | POA: Diagnosis not present

## 2019-01-26 DIAGNOSIS — M25571 Pain in right ankle and joints of right foot: Secondary | ICD-10-CM | POA: Diagnosis not present

## 2019-01-26 DIAGNOSIS — S93401D Sprain of unspecified ligament of right ankle, subsequent encounter: Secondary | ICD-10-CM | POA: Diagnosis not present

## 2019-01-26 DIAGNOSIS — M25572 Pain in left ankle and joints of left foot: Secondary | ICD-10-CM | POA: Diagnosis not present

## 2019-01-26 DIAGNOSIS — R262 Difficulty in walking, not elsewhere classified: Secondary | ICD-10-CM | POA: Diagnosis not present

## 2019-02-02 DIAGNOSIS — S93401D Sprain of unspecified ligament of right ankle, subsequent encounter: Secondary | ICD-10-CM | POA: Diagnosis not present

## 2019-02-02 DIAGNOSIS — M25571 Pain in right ankle and joints of right foot: Secondary | ICD-10-CM | POA: Diagnosis not present

## 2019-02-02 DIAGNOSIS — M25572 Pain in left ankle and joints of left foot: Secondary | ICD-10-CM | POA: Diagnosis not present

## 2019-02-02 DIAGNOSIS — R262 Difficulty in walking, not elsewhere classified: Secondary | ICD-10-CM | POA: Diagnosis not present

## 2019-02-05 DIAGNOSIS — M25572 Pain in left ankle and joints of left foot: Secondary | ICD-10-CM | POA: Diagnosis not present

## 2019-02-05 DIAGNOSIS — R262 Difficulty in walking, not elsewhere classified: Secondary | ICD-10-CM | POA: Diagnosis not present

## 2019-02-05 DIAGNOSIS — S93401D Sprain of unspecified ligament of right ankle, subsequent encounter: Secondary | ICD-10-CM | POA: Diagnosis not present

## 2019-02-05 DIAGNOSIS — M25571 Pain in right ankle and joints of right foot: Secondary | ICD-10-CM | POA: Diagnosis not present

## 2019-02-09 DIAGNOSIS — M25571 Pain in right ankle and joints of right foot: Secondary | ICD-10-CM | POA: Diagnosis not present

## 2019-02-09 DIAGNOSIS — M79672 Pain in left foot: Secondary | ICD-10-CM | POA: Diagnosis not present

## 2019-02-09 DIAGNOSIS — M25572 Pain in left ankle and joints of left foot: Secondary | ICD-10-CM | POA: Diagnosis not present

## 2019-02-09 DIAGNOSIS — M79671 Pain in right foot: Secondary | ICD-10-CM | POA: Diagnosis not present

## 2019-02-17 DIAGNOSIS — M25571 Pain in right ankle and joints of right foot: Secondary | ICD-10-CM | POA: Diagnosis not present

## 2019-02-24 DIAGNOSIS — M25572 Pain in left ankle and joints of left foot: Secondary | ICD-10-CM | POA: Diagnosis not present

## 2019-02-24 DIAGNOSIS — M25571 Pain in right ankle and joints of right foot: Secondary | ICD-10-CM | POA: Diagnosis not present

## 2019-02-27 DIAGNOSIS — M25571 Pain in right ankle and joints of right foot: Secondary | ICD-10-CM | POA: Diagnosis not present

## 2019-02-27 DIAGNOSIS — M25572 Pain in left ankle and joints of left foot: Secondary | ICD-10-CM | POA: Diagnosis not present

## 2019-02-27 DIAGNOSIS — M25371 Other instability, right ankle: Secondary | ICD-10-CM | POA: Insufficient documentation

## 2019-03-09 DIAGNOSIS — F331 Major depressive disorder, recurrent, moderate: Secondary | ICD-10-CM | POA: Diagnosis not present

## 2019-03-09 DIAGNOSIS — F411 Generalized anxiety disorder: Secondary | ICD-10-CM | POA: Diagnosis not present

## 2019-03-10 DIAGNOSIS — M25572 Pain in left ankle and joints of left foot: Secondary | ICD-10-CM | POA: Diagnosis not present

## 2019-04-02 DIAGNOSIS — Z6832 Body mass index (BMI) 32.0-32.9, adult: Secondary | ICD-10-CM | POA: Diagnosis not present

## 2019-04-02 DIAGNOSIS — Z20828 Contact with and (suspected) exposure to other viral communicable diseases: Secondary | ICD-10-CM | POA: Diagnosis not present

## 2019-04-02 DIAGNOSIS — R21 Rash and other nonspecific skin eruption: Secondary | ICD-10-CM | POA: Diagnosis not present

## 2019-04-02 DIAGNOSIS — R197 Diarrhea, unspecified: Secondary | ICD-10-CM | POA: Diagnosis not present

## 2019-04-08 DIAGNOSIS — Z03818 Encounter for observation for suspected exposure to other biological agents ruled out: Secondary | ICD-10-CM | POA: Diagnosis not present

## 2019-04-08 DIAGNOSIS — F17210 Nicotine dependence, cigarettes, uncomplicated: Secondary | ICD-10-CM | POA: Diagnosis not present

## 2019-04-08 DIAGNOSIS — R0602 Shortness of breath: Secondary | ICD-10-CM | POA: Diagnosis not present

## 2019-04-08 DIAGNOSIS — R531 Weakness: Secondary | ICD-10-CM | POA: Diagnosis not present

## 2019-04-10 ENCOUNTER — Other Ambulatory Visit: Payer: Self-pay | Admitting: Neurology

## 2019-04-10 DIAGNOSIS — M25562 Pain in left knee: Secondary | ICD-10-CM | POA: Diagnosis not present

## 2019-04-10 DIAGNOSIS — M25371 Other instability, right ankle: Secondary | ICD-10-CM | POA: Diagnosis not present

## 2019-04-15 DIAGNOSIS — Z6832 Body mass index (BMI) 32.0-32.9, adult: Secondary | ICD-10-CM | POA: Diagnosis not present

## 2019-04-15 DIAGNOSIS — F339 Major depressive disorder, recurrent, unspecified: Secondary | ICD-10-CM | POA: Diagnosis not present

## 2019-04-15 DIAGNOSIS — G43909 Migraine, unspecified, not intractable, without status migrainosus: Secondary | ICD-10-CM | POA: Diagnosis not present

## 2019-07-01 DIAGNOSIS — Z20828 Contact with and (suspected) exposure to other viral communicable diseases: Secondary | ICD-10-CM | POA: Diagnosis not present

## 2019-07-01 DIAGNOSIS — J029 Acute pharyngitis, unspecified: Secondary | ICD-10-CM | POA: Diagnosis not present

## 2019-07-01 DIAGNOSIS — Z6832 Body mass index (BMI) 32.0-32.9, adult: Secondary | ICD-10-CM | POA: Diagnosis not present

## 2019-07-14 DIAGNOSIS — Z6833 Body mass index (BMI) 33.0-33.9, adult: Secondary | ICD-10-CM | POA: Diagnosis not present

## 2019-07-14 DIAGNOSIS — Z1231 Encounter for screening mammogram for malignant neoplasm of breast: Secondary | ICD-10-CM | POA: Diagnosis not present

## 2019-07-14 DIAGNOSIS — G43909 Migraine, unspecified, not intractable, without status migrainosus: Secondary | ICD-10-CM | POA: Diagnosis not present

## 2019-07-14 DIAGNOSIS — G2581 Restless legs syndrome: Secondary | ICD-10-CM | POA: Diagnosis not present

## 2019-07-27 DIAGNOSIS — R519 Headache, unspecified: Secondary | ICD-10-CM | POA: Diagnosis not present

## 2019-07-27 DIAGNOSIS — R05 Cough: Secondary | ICD-10-CM | POA: Diagnosis not present

## 2019-07-27 DIAGNOSIS — Z20828 Contact with and (suspected) exposure to other viral communicable diseases: Secondary | ICD-10-CM | POA: Diagnosis not present

## 2019-08-04 DIAGNOSIS — Z6833 Body mass index (BMI) 33.0-33.9, adult: Secondary | ICD-10-CM | POA: Diagnosis not present

## 2019-08-04 DIAGNOSIS — F339 Major depressive disorder, recurrent, unspecified: Secondary | ICD-10-CM | POA: Diagnosis not present

## 2019-08-04 DIAGNOSIS — G43401 Hemiplegic migraine, not intractable, with status migrainosus: Secondary | ICD-10-CM | POA: Diagnosis not present

## 2019-08-04 DIAGNOSIS — G459 Transient cerebral ischemic attack, unspecified: Secondary | ICD-10-CM | POA: Diagnosis not present

## 2019-08-11 DIAGNOSIS — G459 Transient cerebral ischemic attack, unspecified: Secondary | ICD-10-CM | POA: Diagnosis not present

## 2019-08-11 DIAGNOSIS — I6521 Occlusion and stenosis of right carotid artery: Secondary | ICD-10-CM | POA: Diagnosis not present

## 2019-08-11 DIAGNOSIS — R531 Weakness: Secondary | ICD-10-CM | POA: Diagnosis not present

## 2019-08-11 DIAGNOSIS — I340 Nonrheumatic mitral (valve) insufficiency: Secondary | ICD-10-CM

## 2019-08-11 DIAGNOSIS — I361 Nonrheumatic tricuspid (valve) insufficiency: Secondary | ICD-10-CM

## 2019-08-18 DIAGNOSIS — F17210 Nicotine dependence, cigarettes, uncomplicated: Secondary | ICD-10-CM | POA: Diagnosis not present

## 2019-08-18 DIAGNOSIS — F29 Unspecified psychosis not due to a substance or known physiological condition: Secondary | ICD-10-CM | POA: Diagnosis not present

## 2019-08-18 DIAGNOSIS — R479 Unspecified speech disturbances: Secondary | ICD-10-CM | POA: Diagnosis not present

## 2019-08-18 DIAGNOSIS — I10 Essential (primary) hypertension: Secondary | ICD-10-CM | POA: Diagnosis not present

## 2019-08-18 DIAGNOSIS — R4781 Slurred speech: Secondary | ICD-10-CM | POA: Diagnosis not present

## 2019-08-18 DIAGNOSIS — R531 Weakness: Secondary | ICD-10-CM | POA: Diagnosis not present

## 2019-08-18 DIAGNOSIS — Z8673 Personal history of transient ischemic attack (TIA), and cerebral infarction without residual deficits: Secondary | ICD-10-CM | POA: Diagnosis not present

## 2019-08-18 DIAGNOSIS — R064 Hyperventilation: Secondary | ICD-10-CM | POA: Diagnosis not present

## 2019-08-26 DIAGNOSIS — R4701 Aphasia: Secondary | ICD-10-CM | POA: Diagnosis not present

## 2019-08-26 DIAGNOSIS — R262 Difficulty in walking, not elsewhere classified: Secondary | ICD-10-CM | POA: Diagnosis not present

## 2019-08-26 DIAGNOSIS — R519 Headache, unspecified: Secondary | ICD-10-CM | POA: Diagnosis not present

## 2019-08-26 DIAGNOSIS — R531 Weakness: Secondary | ICD-10-CM | POA: Diagnosis not present

## 2019-09-01 DIAGNOSIS — R262 Difficulty in walking, not elsewhere classified: Secondary | ICD-10-CM | POA: Insufficient documentation

## 2019-09-01 DIAGNOSIS — R519 Headache, unspecified: Secondary | ICD-10-CM | POA: Insufficient documentation

## 2019-09-01 DIAGNOSIS — R531 Weakness: Secondary | ICD-10-CM | POA: Insufficient documentation

## 2019-09-01 DIAGNOSIS — R4701 Aphasia: Secondary | ICD-10-CM | POA: Insufficient documentation

## 2019-09-14 DIAGNOSIS — R200 Anesthesia of skin: Secondary | ICD-10-CM | POA: Diagnosis not present

## 2019-09-14 DIAGNOSIS — M542 Cervicalgia: Secondary | ICD-10-CM | POA: Diagnosis not present

## 2019-09-14 DIAGNOSIS — M79641 Pain in right hand: Secondary | ICD-10-CM | POA: Diagnosis not present

## 2019-09-14 DIAGNOSIS — R29898 Other symptoms and signs involving the musculoskeletal system: Secondary | ICD-10-CM | POA: Diagnosis not present

## 2019-09-18 DIAGNOSIS — F339 Major depressive disorder, recurrent, unspecified: Secondary | ICD-10-CM | POA: Diagnosis not present

## 2019-09-18 DIAGNOSIS — F419 Anxiety disorder, unspecified: Secondary | ICD-10-CM | POA: Diagnosis not present

## 2019-09-18 DIAGNOSIS — Z79899 Other long term (current) drug therapy: Secondary | ICD-10-CM | POA: Diagnosis not present

## 2019-09-29 DIAGNOSIS — M25551 Pain in right hip: Secondary | ICD-10-CM | POA: Insufficient documentation

## 2019-09-29 DIAGNOSIS — G8314 Monoplegia of lower limb affecting left nondominant side: Secondary | ICD-10-CM | POA: Diagnosis not present

## 2019-09-29 DIAGNOSIS — G8311 Monoplegia of lower limb affecting right dominant side: Secondary | ICD-10-CM | POA: Diagnosis not present

## 2019-09-29 DIAGNOSIS — M545 Low back pain: Secondary | ICD-10-CM | POA: Diagnosis not present

## 2019-09-30 ENCOUNTER — Other Ambulatory Visit: Payer: Self-pay | Admitting: Sports Medicine

## 2019-09-30 DIAGNOSIS — M545 Low back pain, unspecified: Secondary | ICD-10-CM

## 2019-09-30 DIAGNOSIS — M25551 Pain in right hip: Secondary | ICD-10-CM

## 2019-10-02 DIAGNOSIS — R569 Unspecified convulsions: Secondary | ICD-10-CM | POA: Diagnosis not present

## 2019-10-07 DIAGNOSIS — R531 Weakness: Secondary | ICD-10-CM | POA: Diagnosis not present

## 2019-10-07 DIAGNOSIS — R29898 Other symptoms and signs involving the musculoskeletal system: Secondary | ICD-10-CM | POA: Diagnosis not present

## 2019-10-07 DIAGNOSIS — M79641 Pain in right hand: Secondary | ICD-10-CM | POA: Diagnosis not present

## 2019-10-07 DIAGNOSIS — M542 Cervicalgia: Secondary | ICD-10-CM | POA: Diagnosis not present

## 2019-10-08 DIAGNOSIS — Z6833 Body mass index (BMI) 33.0-33.9, adult: Secondary | ICD-10-CM | POA: Diagnosis not present

## 2019-10-08 DIAGNOSIS — Z1321 Encounter for screening for nutritional disorder: Secondary | ICD-10-CM | POA: Diagnosis not present

## 2019-10-08 DIAGNOSIS — L309 Dermatitis, unspecified: Secondary | ICD-10-CM | POA: Diagnosis not present

## 2019-10-08 DIAGNOSIS — E538 Deficiency of other specified B group vitamins: Secondary | ICD-10-CM | POA: Diagnosis not present

## 2019-10-08 DIAGNOSIS — E039 Hypothyroidism, unspecified: Secondary | ICD-10-CM | POA: Diagnosis not present

## 2019-10-08 DIAGNOSIS — R202 Paresthesia of skin: Secondary | ICD-10-CM | POA: Diagnosis not present

## 2019-10-11 ENCOUNTER — Ambulatory Visit
Admission: RE | Admit: 2019-10-11 | Discharge: 2019-10-11 | Disposition: A | Discharge status: Home / Self Care | Payer: BC Managed Care – PPO | Source: Ambulatory Visit | Attending: Sports Medicine | Admitting: Sports Medicine

## 2019-10-11 ENCOUNTER — Other Ambulatory Visit: Payer: Self-pay

## 2019-10-11 DIAGNOSIS — M545 Low back pain, unspecified: Secondary | ICD-10-CM

## 2019-10-11 DIAGNOSIS — M25551 Pain in right hip: Secondary | ICD-10-CM

## 2019-10-11 IMAGING — MR MR HIP*R* W/O CM
5 of 6 series · 33 of 40 positions shown · non-contrast
Comparison: None.

CLINICAL DATA: Right hip pain radiating throughout the leg with
weakness. The patient suffered a ligament tear in the right ankle
due to an injury 3 months ago.

EXAM:
MR OF THE RIGHT HIP WITHOUT CONTRAST
TECHNIQUE: Multiplanar, multisequence MR imaging was performed. No intravenous
contrast was administered.

[Series 6: T2 fat-sat · coronal · 4.0mm · 0.74mm/px · 7 of 21 slices shown (1 of 2)]
[im 1/21]
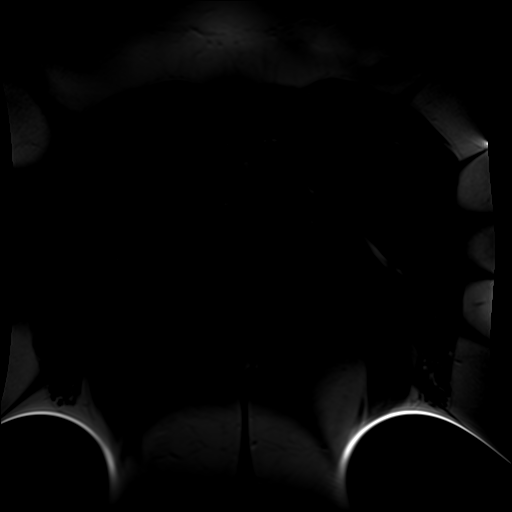
[im 4/21]
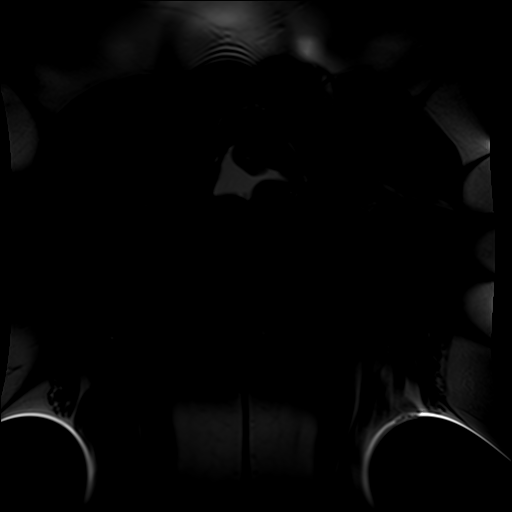
[im 7/21]
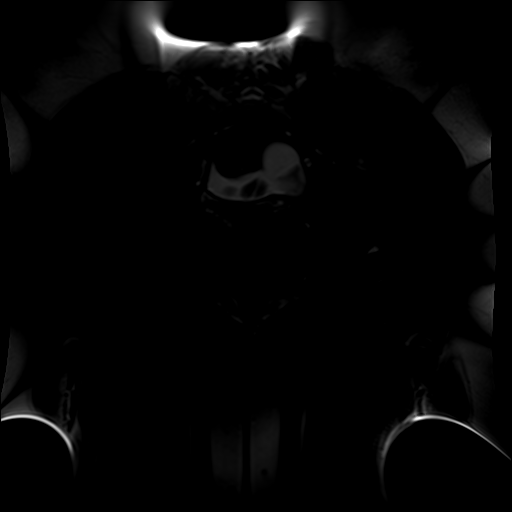
[im 11/21]
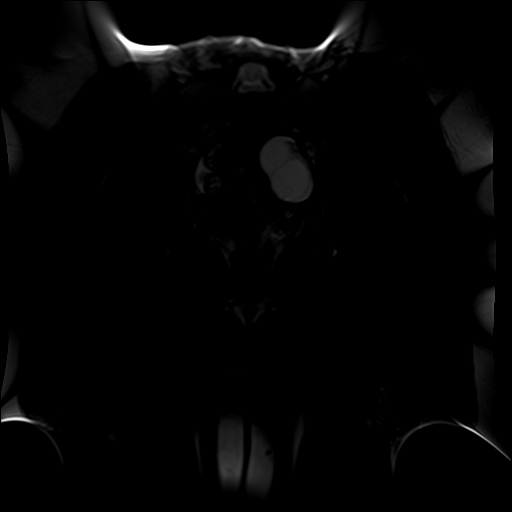
[im 14/21]
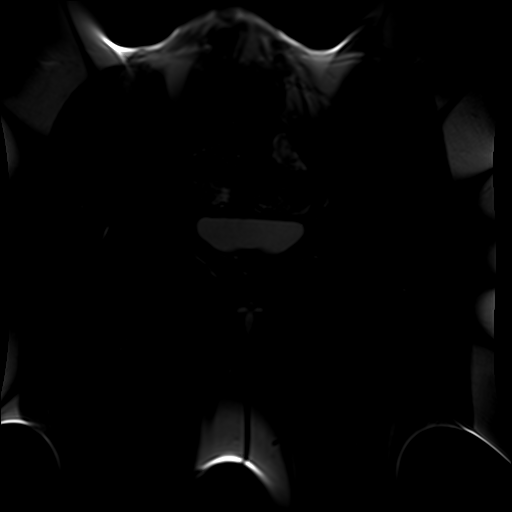
[im 17/21]
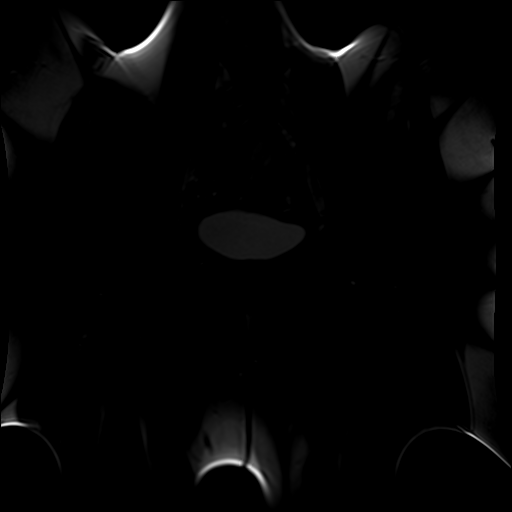
[im 21/21]
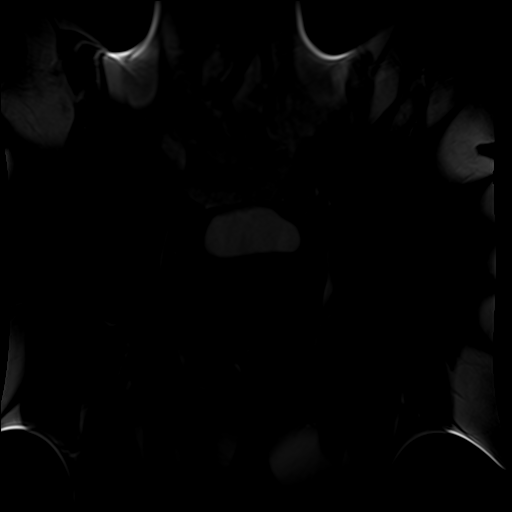

[Series 8: T2 fat-sat · axial · 4.0mm · 0.35mm/px · z∈[-66,+49]mm · 8 of 24 slices shown (2 of 2)]
[im 1/24]
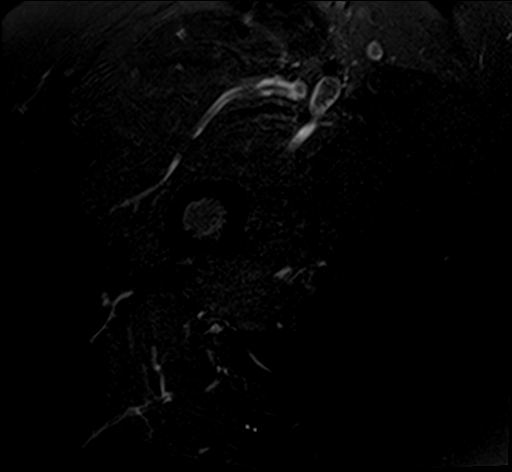
[im 4/24]
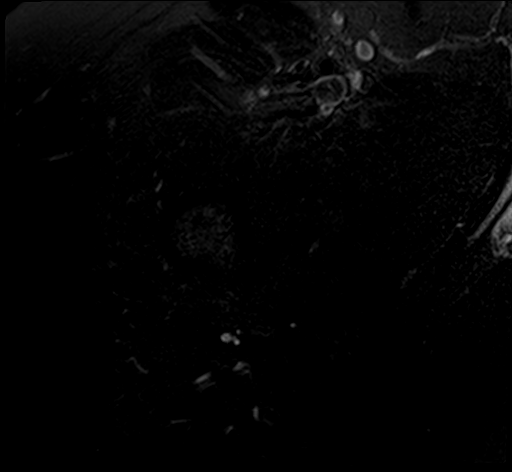
[im 7/24]
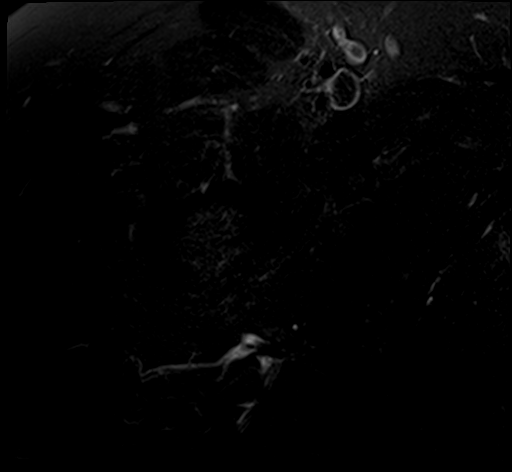
[im 10/24]
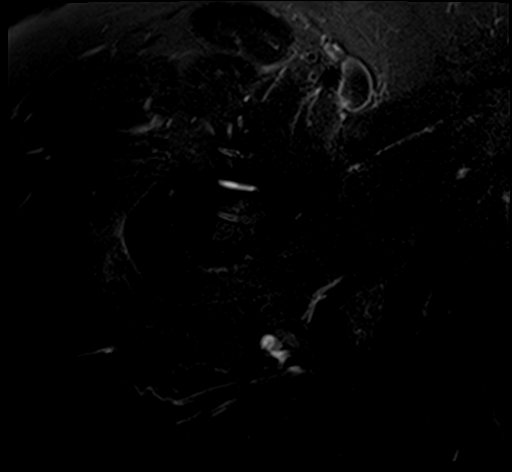
[im 14/24]
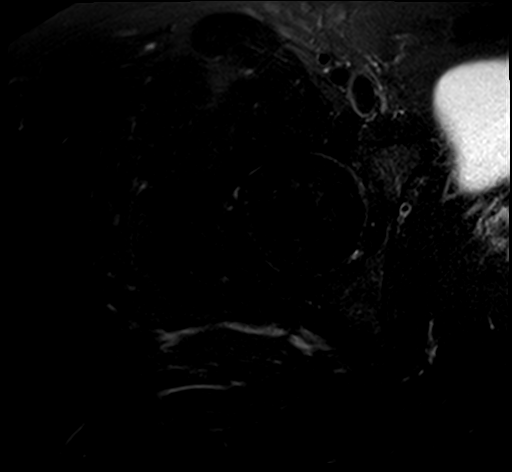
[im 17/24]
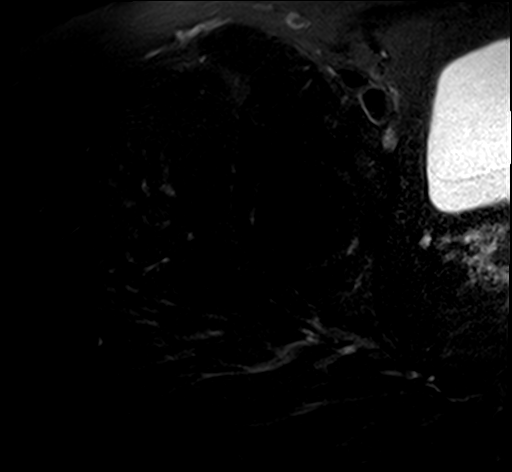
[im 20/24]
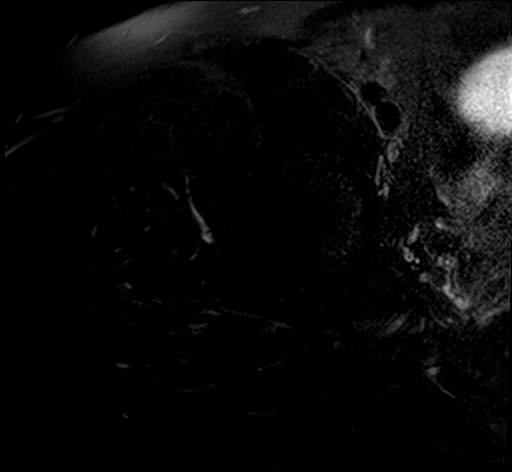
[im 24/24]
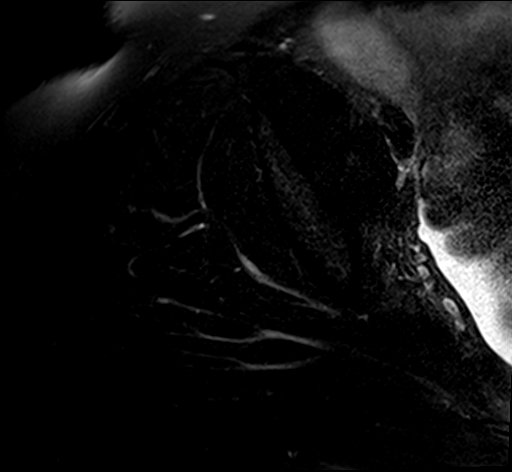

[Series 9: PD fat-sat · coronal · 4.0mm · 0.70mm/px · 6 of 19 slices shown (1 of 3)]
[im 1/19]
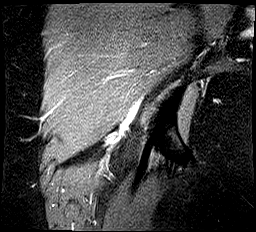
[im 4/19]
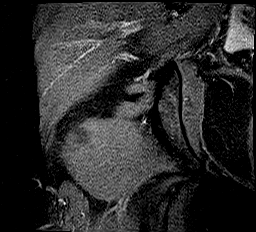
[im 8/19]
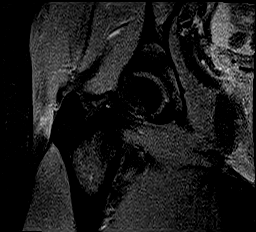
[im 11/19]
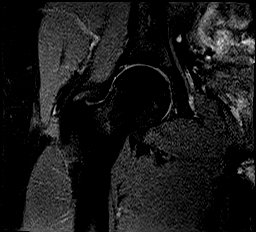
[im 15/19]
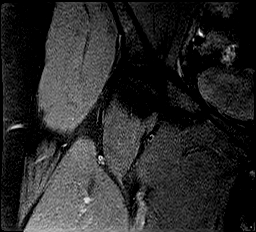
[im 19/19]
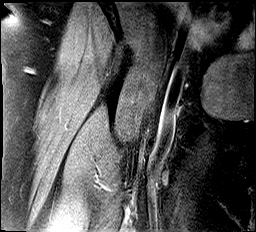

[Series 10: PD fat-sat · sagittal · 4.0mm · 0.70mm/px · 7 of 22 slices shown (2 of 3)]
[im 1/22]
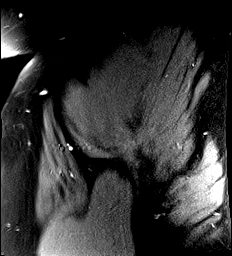
[im 4/22]
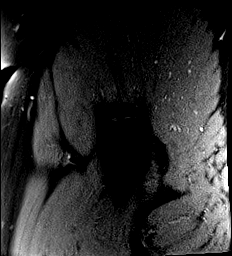
[im 8/22]
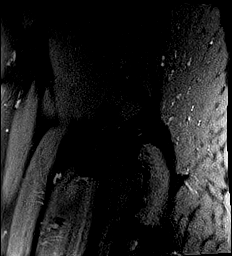
[im 11/22]
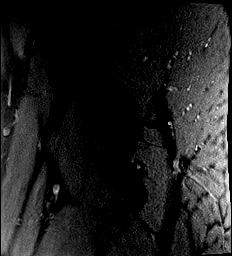
[im 15/22]
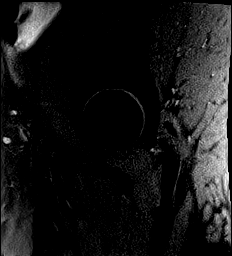
[im 18/22]
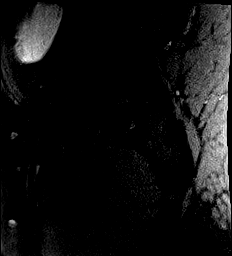
[im 22/22]
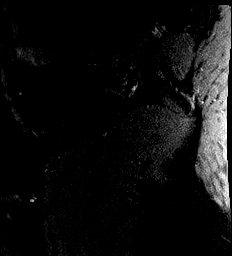

[Series 11: PD fat-sat · oblique · 4.0mm · 0.70mm/px · 5 of 17 slices shown (3 of 3)]
[im 1/17]
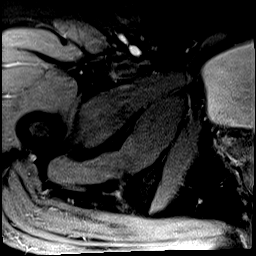
[im 5/17]
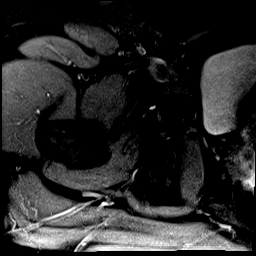
[im 9/17]
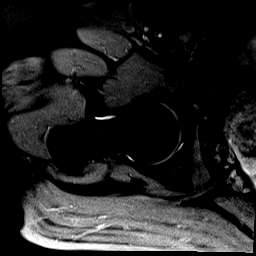
[im 13/17]
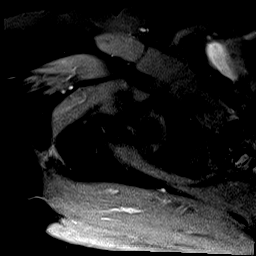
[im 17/17]
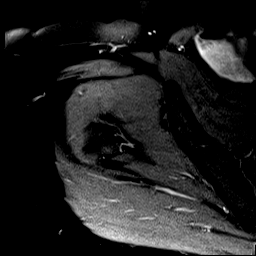

[33 of 40 positions shown; findings below may reference images not displayed]

FINDINGS: Bones: Marrow signal is normal without fracture, stress change or
worrisome lesion. No avascular necrosis of the femoral heads. No
subchondral cyst formation or edema about the hips.

Articular cartilage and labrum

Articular cartilage:  Preserved.

Labrum:  Intact.

Joint or bursal effusion

Joint effusion:  None.

Bursae: Negative.

Muscles and tendons

Muscles and tendons:  Intact and normal in appearance.

Other findings

Miscellaneous: There is a septated left ovarian lesion measuring
cm craniocaudal by 3.2 cm transverse. A moderate volume of free
pelvic fluid is greater than typically seen in physiologic change.
IMPRESSION: 5.4 cm left ovarian lesion with a larger than typically seen volume
of free pelvic fluid. Pelvic ultrasound is recommended for further
evaluation.

Normal appearing right hip.

These results will be called to the ordering clinician or
representative by the Radiologist Assistant, and communication
documented in the PACS or [REDACTED].

## 2019-10-11 IMAGING — MR MR LUMBAR SPINE W/O CM
4 of 5 series · 26 of 48 positions shown · non-contrast
Comparison: MRI lumbar spine [DATE]. Plain films lumbar spine
[DATE].

CLINICAL DATA: Low back pain with right hip pain, weakness and
decreased sensation in the right leg. No known injury.

EXAM:
MRI LUMBAR SPINE WITHOUT CONTRAST
TECHNIQUE: Multiplanar, multisequence MR imaging of the lumbar spine was
performed. No intravenous contrast was administered.

[Series 4: T1 · sagittal · 4.0mm · 0.55mm/px · 5 of 13 slices shown (1 of 2)]
[im 1/13]
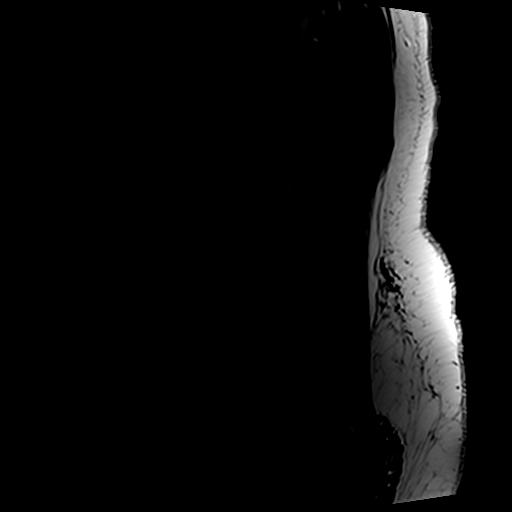
[im 4/13]
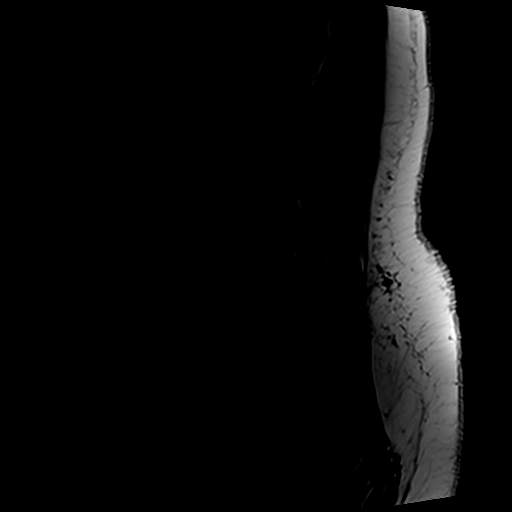
[im 7/13]
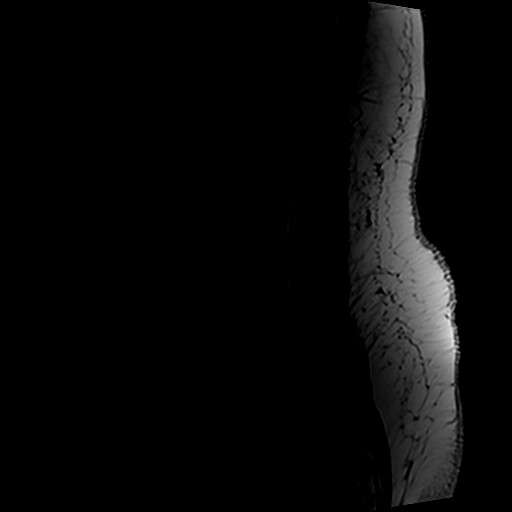
[im 10/13]
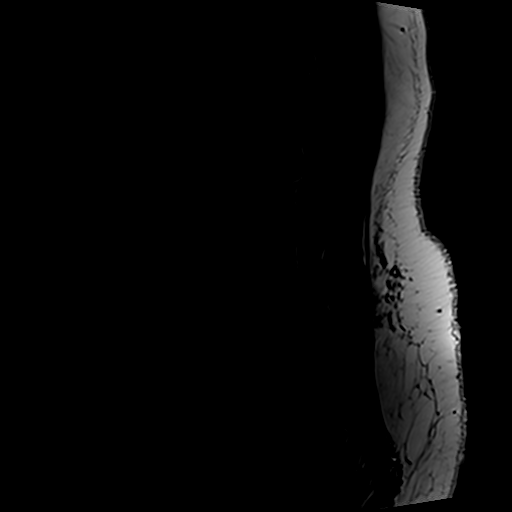
[im 13/13]
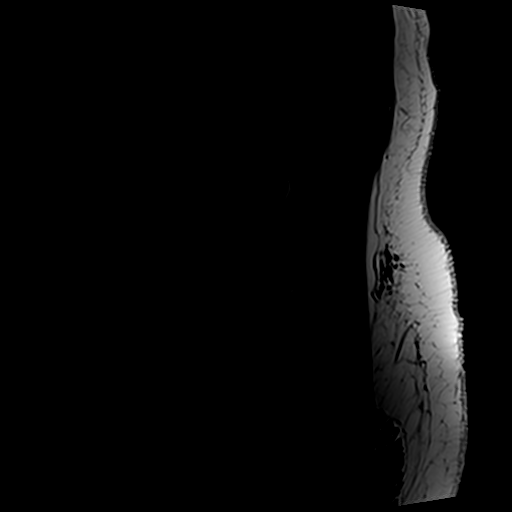

[Series 5: T2 post-contrast · sagittal · 4.0mm · 0.55mm/px · 6 of 13 slices shown]
[im 1/13]
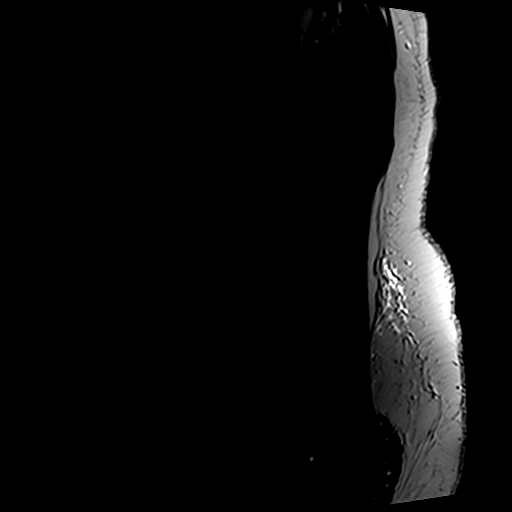
[im 3/13]
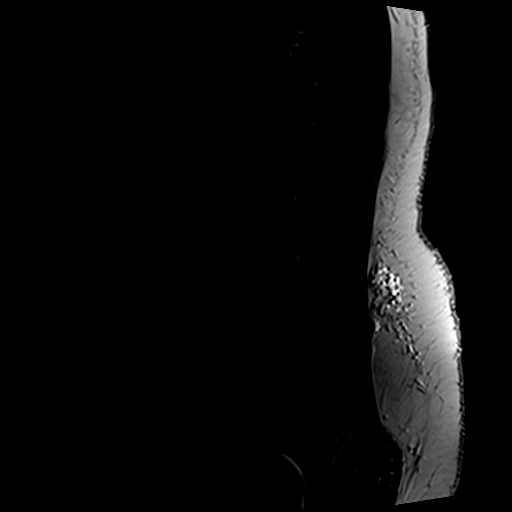
[im 5/13]
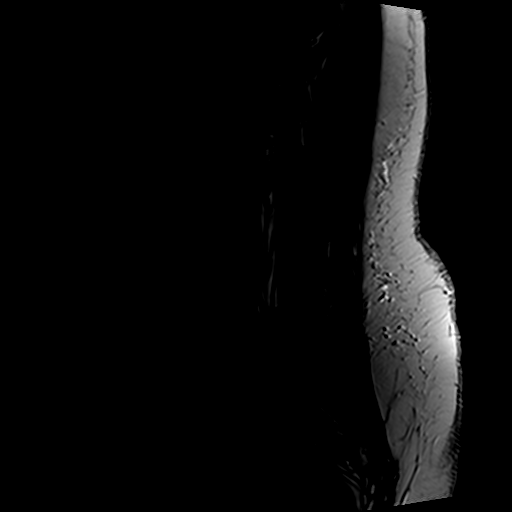
[im 8/13]
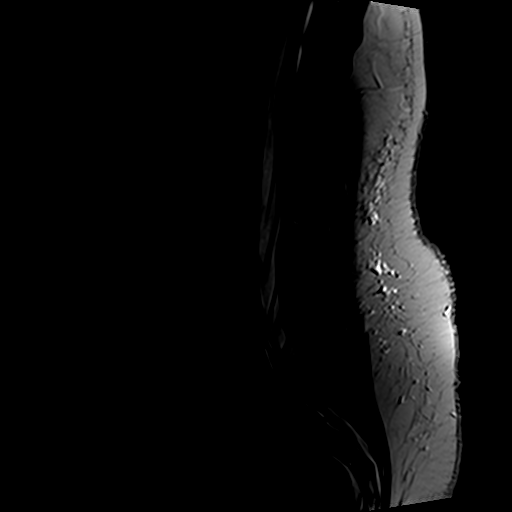
[im 10/13]
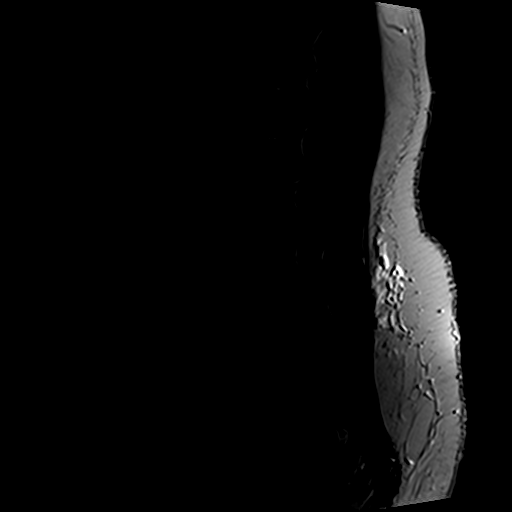
[im 13/13]
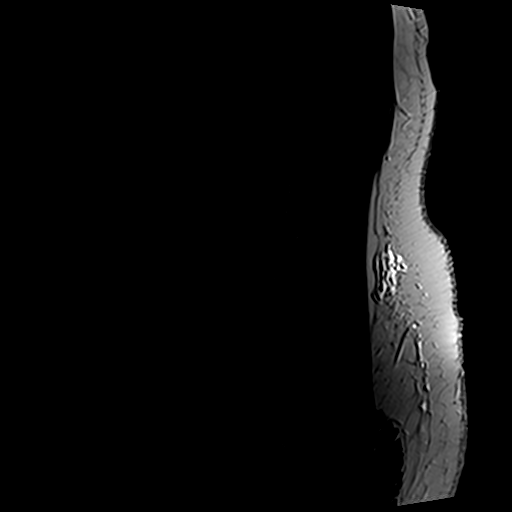

[Series 6: T2 · axial · 4.0mm · 0.70mm/px · z∈[-56,+137]mm · 10 of 36 slices shown]
[im 3/36]
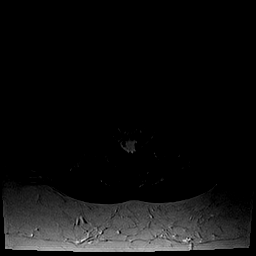
[im 5/36]
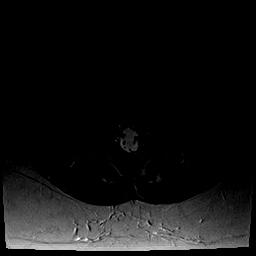
[im 8/36]
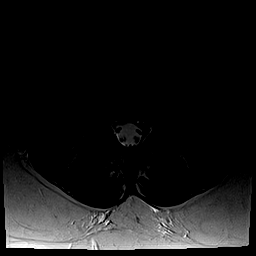
[im 12/36]
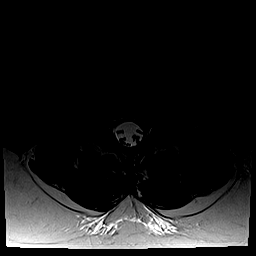
[im 17/36]
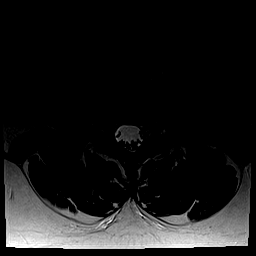
[im 19/36]
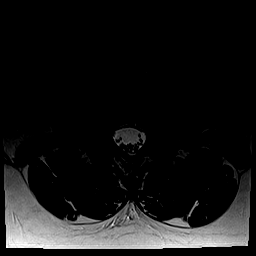
[im 22/36]
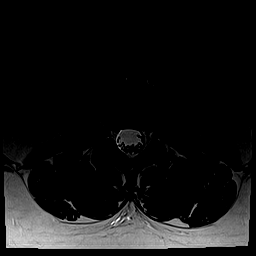
[im 26/36]
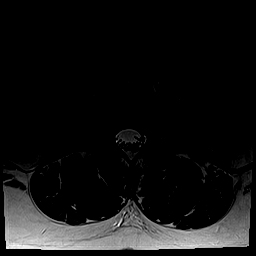
[im 31/36]
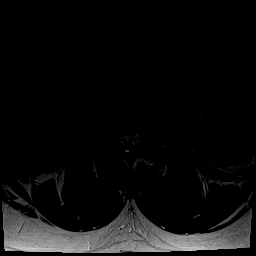
[im 36/36]
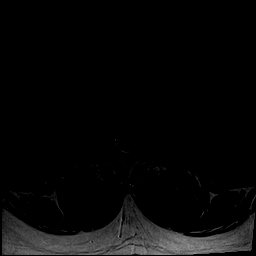

[Series 7: T1 · axial · 4.0mm · 0.35mm/px · z∈[-56,+111]mm · 5 of 36 slices shown (2 of 2)]
[im 3/36]
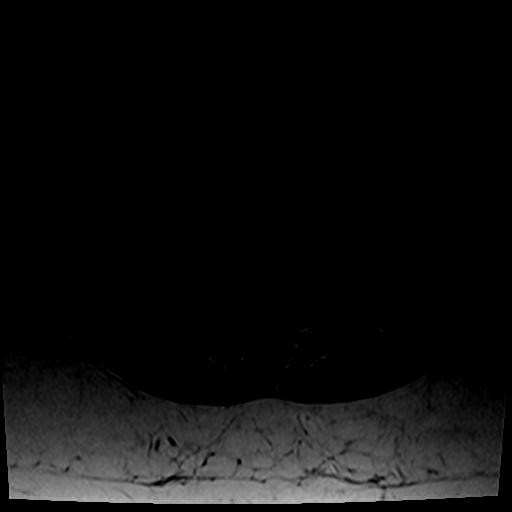
[im 5/36]
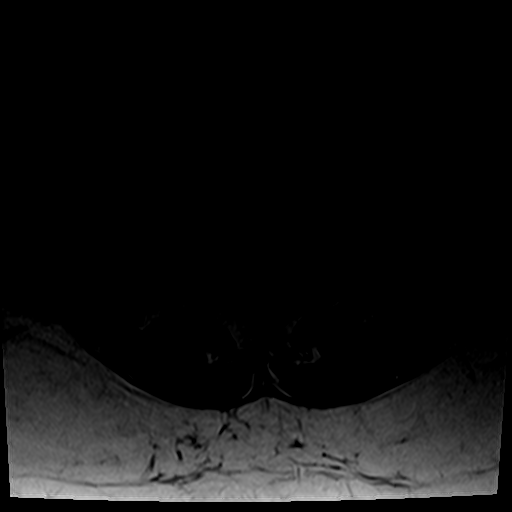
[im 8/36]
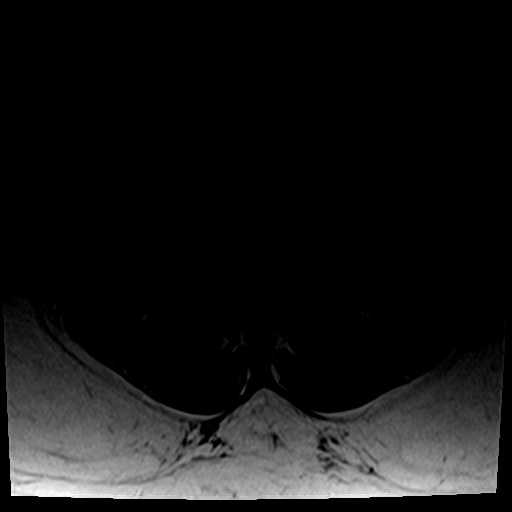
[im 19/36]
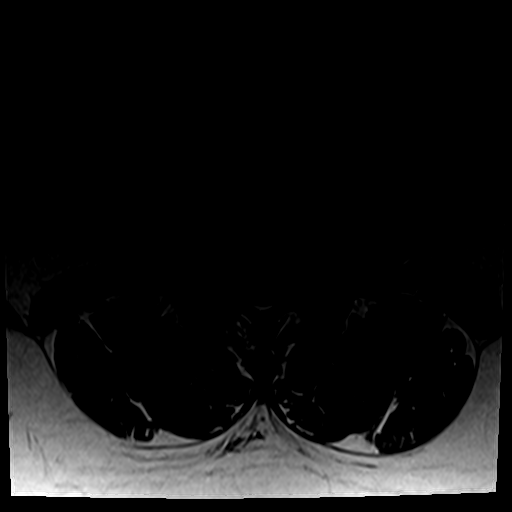
[im 31/36]
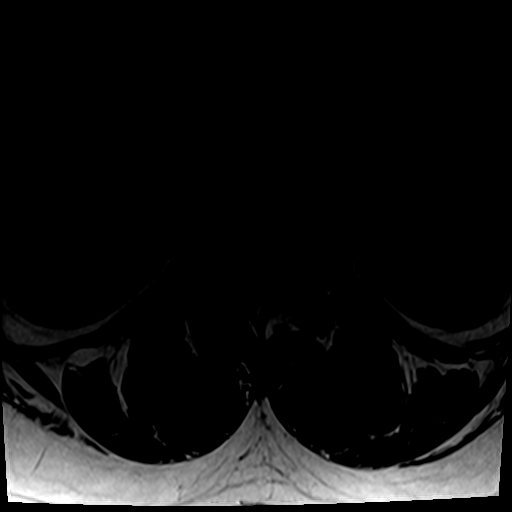

[26 of 48 positions shown; findings below may reference images not displayed]

FINDINGS: Segmentation:  Standard.

Alignment:  Normal.

Vertebrae:  No fracture, evidence of discitis, or bone lesion.

Conus medullaris and cauda equina: Conus extends to the L1 level.
Conus and cauda equina appear normal.

Paraspinal and other soft tissues: There is incomplete visualization
of a cystic lesion in the left pelvis measuring at least 4 cm in
diameter which is likely ovarian in origin. See report of right hip
MRI this same day.

Disc levels:

The central spinal canal and neural foramina are widely patent at
all levels. Disc desiccation and minimal bulging L5-S1 are seen.
There is mild to moderate facet degenerative disease at L4-5, most
notable on the right and bilaterally at L5-S1.
IMPRESSION: No change in the appearance of the lumbar spine since the prior
examination. The central spinal canal and neural foramina are widely
patent at all levels with minimal disc bulging at L5-S1 noted. Lower
lumbar facet degenerative change is most notable on the right at
L4-5 and bilaterally at L5-S1.

## 2019-10-15 DIAGNOSIS — N838 Other noninflammatory disorders of ovary, fallopian tube and broad ligament: Secondary | ICD-10-CM | POA: Diagnosis not present

## 2019-10-15 DIAGNOSIS — G8314 Monoplegia of lower limb affecting left nondominant side: Secondary | ICD-10-CM | POA: Diagnosis not present

## 2019-10-15 DIAGNOSIS — G8311 Monoplegia of lower limb affecting right dominant side: Secondary | ICD-10-CM | POA: Diagnosis not present

## 2019-10-15 DIAGNOSIS — R300 Dysuria: Secondary | ICD-10-CM | POA: Diagnosis not present

## 2019-10-15 DIAGNOSIS — Z6831 Body mass index (BMI) 31.0-31.9, adult: Secondary | ICD-10-CM | POA: Diagnosis not present

## 2019-10-15 DIAGNOSIS — Z8673 Personal history of transient ischemic attack (TIA), and cerebral infarction without residual deficits: Secondary | ICD-10-CM | POA: Diagnosis not present

## 2019-10-22 DIAGNOSIS — M79661 Pain in right lower leg: Secondary | ICD-10-CM | POA: Diagnosis not present

## 2019-10-26 ENCOUNTER — Other Ambulatory Visit: Payer: Self-pay

## 2019-10-26 DIAGNOSIS — M5136 Other intervertebral disc degeneration, lumbar region: Secondary | ICD-10-CM | POA: Diagnosis not present

## 2019-10-26 DIAGNOSIS — M51369 Other intervertebral disc degeneration, lumbar region without mention of lumbar back pain or lower extremity pain: Secondary | ICD-10-CM | POA: Insufficient documentation

## 2019-10-28 ENCOUNTER — Other Ambulatory Visit (HOSPITAL_COMMUNITY): Payer: Self-pay | Admitting: Neurology

## 2019-10-28 ENCOUNTER — Other Ambulatory Visit: Payer: Self-pay | Admitting: Neurology

## 2019-10-28 DIAGNOSIS — M542 Cervicalgia: Secondary | ICD-10-CM

## 2019-10-28 DIAGNOSIS — R531 Weakness: Secondary | ICD-10-CM

## 2019-10-29 ENCOUNTER — Other Ambulatory Visit: Payer: Self-pay | Admitting: Physical Medicine and Rehabilitation

## 2019-10-29 DIAGNOSIS — R1900 Intra-abdominal and pelvic swelling, mass and lump, unspecified site: Secondary | ICD-10-CM | POA: Diagnosis not present

## 2019-10-29 DIAGNOSIS — M5136 Other intervertebral disc degeneration, lumbar region: Secondary | ICD-10-CM

## 2019-11-02 ENCOUNTER — Other Ambulatory Visit: Payer: Self-pay

## 2019-11-02 ENCOUNTER — Ambulatory Visit
Admission: RE | Admit: 2019-11-02 | Discharge: 2019-11-02 | Disposition: A | Discharge status: Home / Self Care | Payer: BC Managed Care – PPO | Source: Ambulatory Visit | Attending: Physical Medicine and Rehabilitation | Admitting: Physical Medicine and Rehabilitation

## 2019-11-02 DIAGNOSIS — M5136 Other intervertebral disc degeneration, lumbar region: Secondary | ICD-10-CM

## 2019-11-02 DIAGNOSIS — M5116 Intervertebral disc disorders with radiculopathy, lumbar region: Secondary | ICD-10-CM | POA: Diagnosis not present

## 2019-11-02 IMAGING — XA Imaging study
1 series · 1 of 1 positions shown · non-contrast
Comparison: none

CLINICAL DATA: Low back pain. Right lower extremity radiculopathy.
Displacement of the L5-S1 lumbar disc.

[Series 1: ortho standard · 1 of 1 slices shown]
[im 1/1]
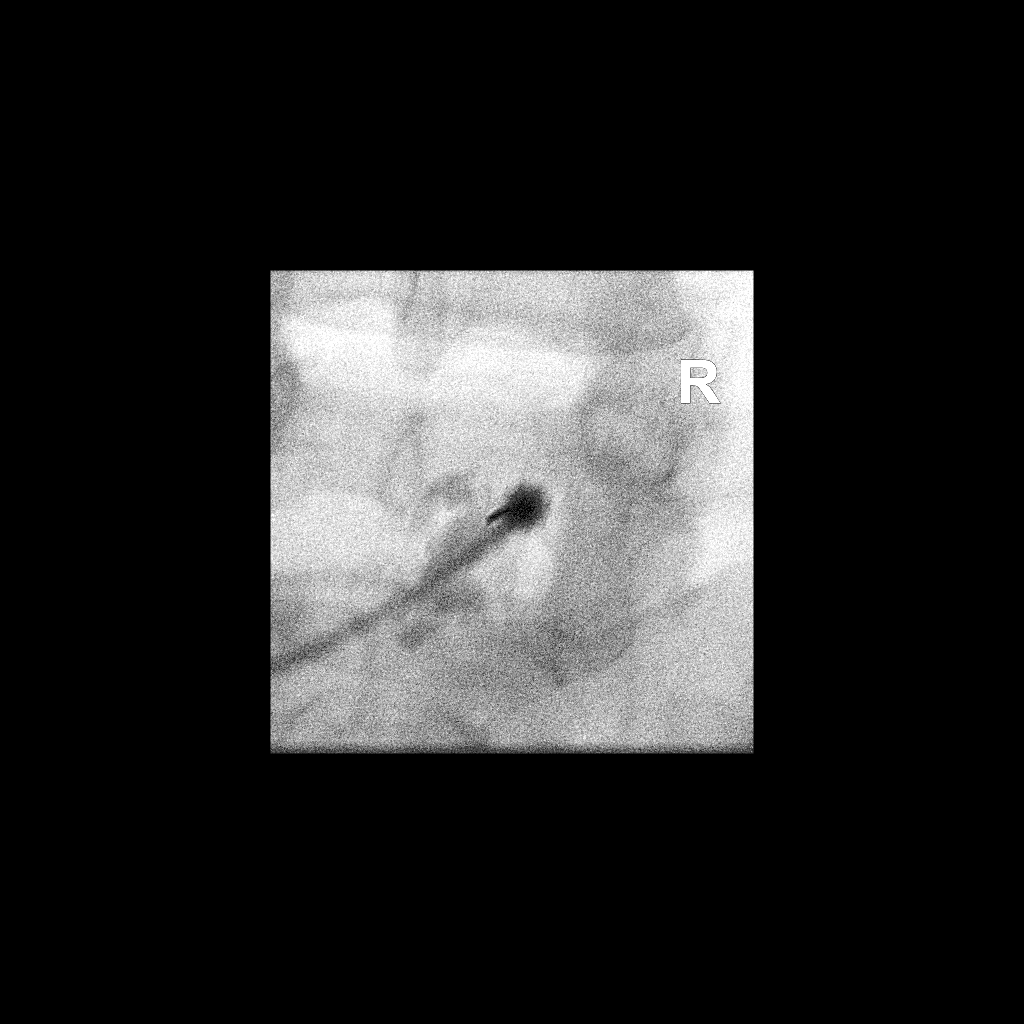

[1 of 1 positions shown; findings below may reference images not displayed]

FLUOROSCOPY TIME:  Radiation Exposure Index (as provided by the
fluoroscopic device): 10.17 uGy*m2

PROCEDURE:
The procedure, risks, benefits, and alternatives were explained to
the patient. Questions regarding the procedure were encouraged and
answered. The patient understands and consents to the procedure.

LUMBAR EPIDURAL INJECTION:

An interlaminar approach was performed on right at L5-S1. The
overlying skin was cleansed and anesthetized. A 20 gauge epidural
needle was advanced using loss-of-resistance technique.

DIAGNOSTIC EPIDURAL INJECTION:

Injection of Isovue-M 200 shows a good epidural pattern with spread
above and below the level of needle placement, primarily on the
right no vascular opacification is seen.

THERAPEUTIC EPIDURAL INJECTION:

120 mg of Depo-Medrol mixed with 3 mL 1% lidocaine were instilled.
The procedure was well-tolerated, and the patient was discharged
thirty minutes following the injection in good condition.

COMPLICATIONS:
None
IMPRESSION: Technically successful epidural injection on the right L5-S1 # 1

## 2019-11-02 MED ORDER — IOPAMIDOL (ISOVUE-M 200) INJECTION 41%
1.0000 mL | Freq: Once | INTRAMUSCULAR | Status: AC
  Administered 2019-11-02: 1 mL via EPIDURAL

## 2019-11-02 MED ORDER — METHYLPREDNISOLONE ACETATE 40 MG/ML INJ SUSP (RADIOLOG
120.0000 mg | Freq: Once | INTRAMUSCULAR | Status: AC
  Administered 2019-11-02: 120 mg via EPIDURAL

## 2019-11-02 NOTE — Discharge Instructions (Signed)

## 2019-11-05 DIAGNOSIS — F411 Generalized anxiety disorder: Secondary | ICD-10-CM | POA: Diagnosis not present

## 2019-11-05 DIAGNOSIS — F331 Major depressive disorder, recurrent, moderate: Secondary | ICD-10-CM | POA: Diagnosis not present

## 2019-11-10 ENCOUNTER — Other Ambulatory Visit: Payer: BC Managed Care – PPO

## 2019-11-10 ENCOUNTER — Ambulatory Visit: Payer: BC Managed Care – PPO

## 2019-11-11 DIAGNOSIS — R1900 Intra-abdominal and pelvic swelling, mass and lump, unspecified site: Secondary | ICD-10-CM | POA: Diagnosis not present

## 2019-11-11 DIAGNOSIS — N83202 Unspecified ovarian cyst, left side: Secondary | ICD-10-CM | POA: Diagnosis not present

## 2019-11-12 ENCOUNTER — Other Ambulatory Visit: Payer: Self-pay

## 2019-11-12 ENCOUNTER — Ambulatory Visit
Admission: RE | Admit: 2019-11-12 | Discharge: 2019-11-12 | Disposition: A | Discharge status: Home / Self Care | Payer: BC Managed Care – PPO | Source: Ambulatory Visit | Attending: Neurology | Admitting: Neurology

## 2019-11-12 DIAGNOSIS — M542 Cervicalgia: Secondary | ICD-10-CM | POA: Diagnosis not present

## 2019-11-12 DIAGNOSIS — R531 Weakness: Secondary | ICD-10-CM

## 2019-11-12 IMAGING — MR MR THORACIC SPINE W/O CM
5 of 6 series · 28 of 48 positions shown · non-contrast
Comparison: [DATE] and [DATE] cervical spine radiographs.

CLINICAL DATA: Neck pain.

EXAM:
MRI CERVICAL AND THORACIC SPINE WITHOUT CONTRAST
TECHNIQUE: Multiplanar and multiecho pulse sequences of the cervical spine, to
include the craniocervical junction and cervicothoracic junction,
and the thoracic spine, were obtained without intravenous contrast.

[Series 16: T1 · sagittal · 5.0mm · 1.41mm/px · 2 of 9 slices shown (1 of 2)]
[im 1/9]
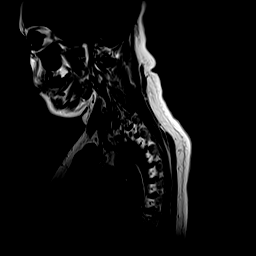
[im 9/9]
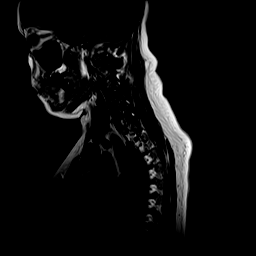

[Series 17: T2 · sagittal · 3.0mm · 1.06mm/px · 6 of 17 slices shown (1 of 2)]
[im 1/17]
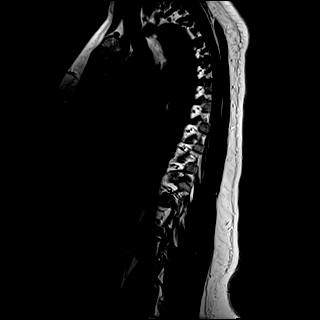
[im 4/17]
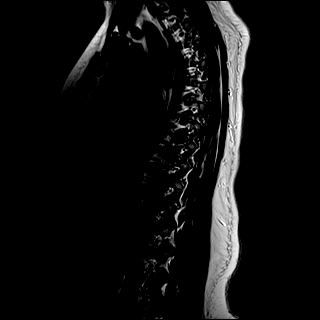
[im 7/17]
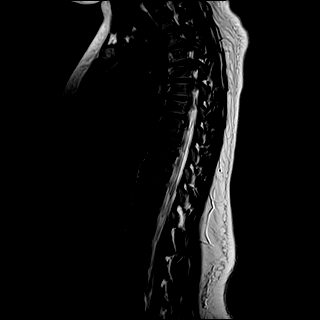
[im 10/17]
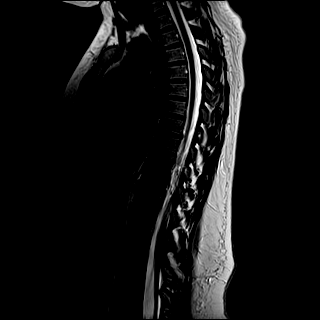
[im 13/17]
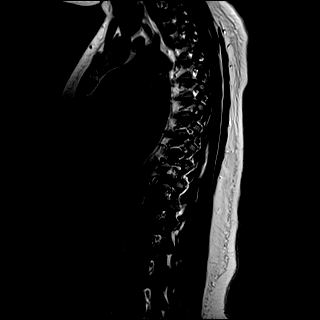
[im 17/17]
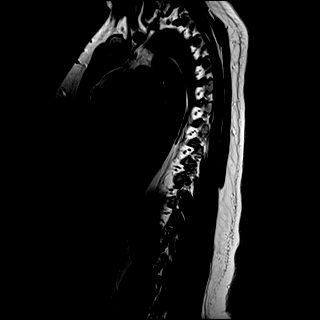

[Series 18: T1 · sagittal · 3.0mm · 1.06mm/px · 6 of 17 slices shown (2 of 2)]
[im 1/17]
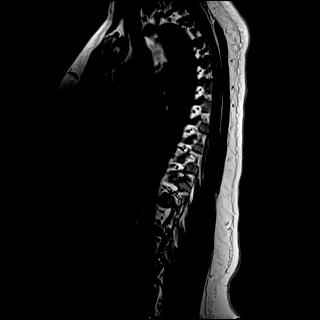
[im 4/17]
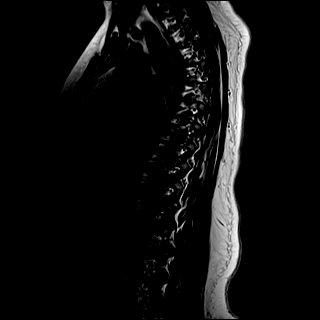
[im 7/17]
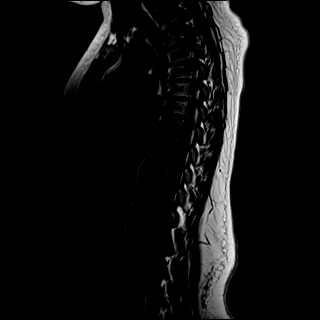
[im 10/17]
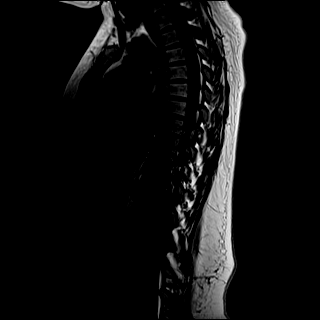
[im 13/17]
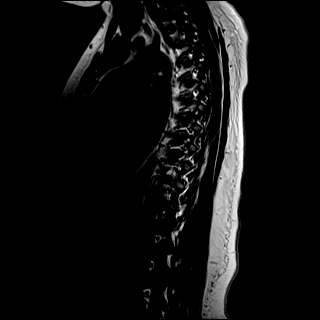
[im 17/17]
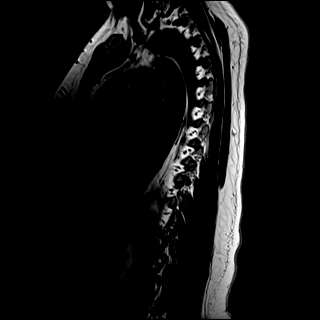

[Series 19: STIR · sagittal · 3.0mm · 0.53mm/px · 6 of 17 slices shown]
[im 1/17]
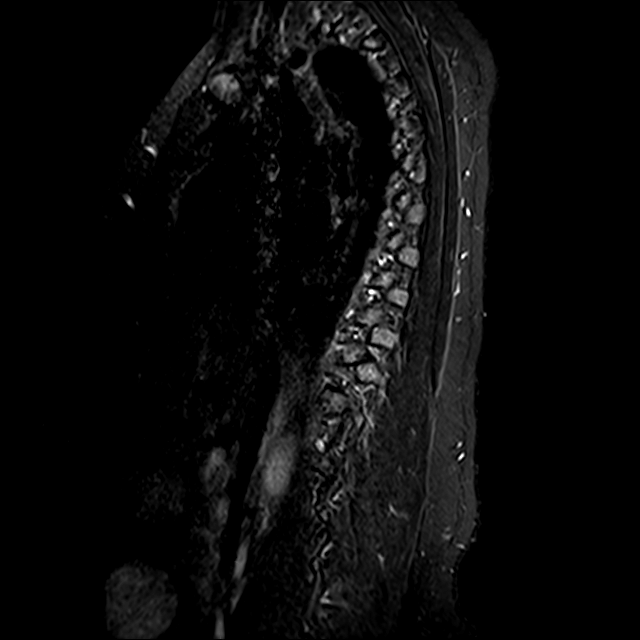
[im 4/17]
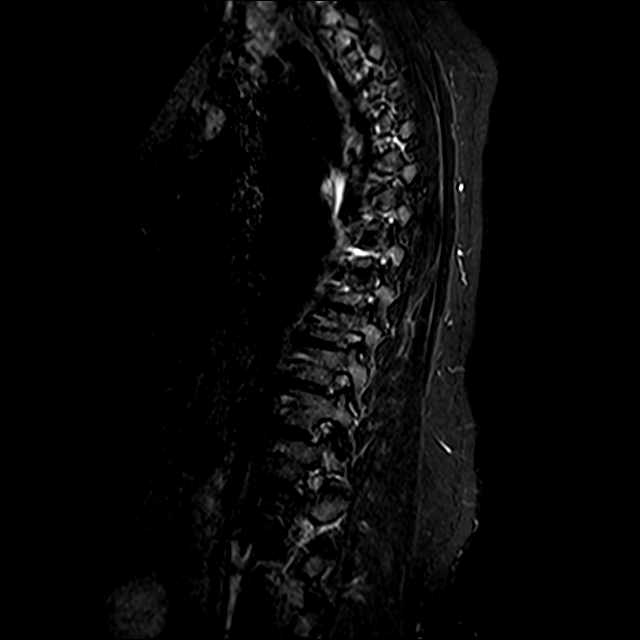
[im 7/17]
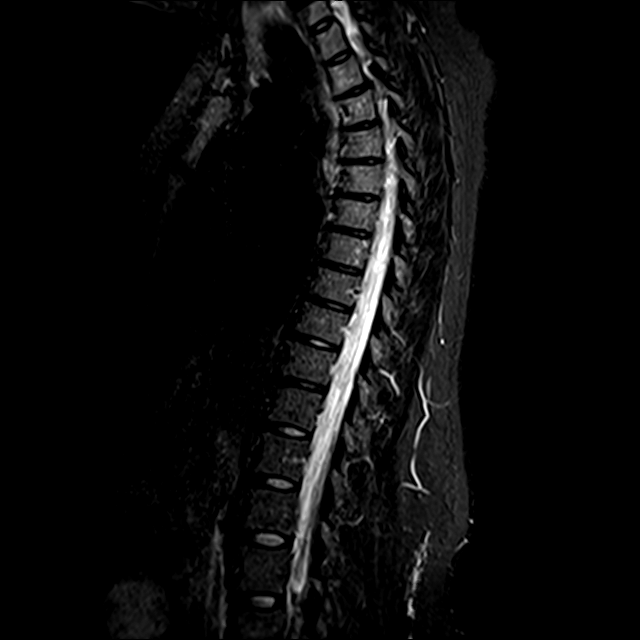
[im 10/17]
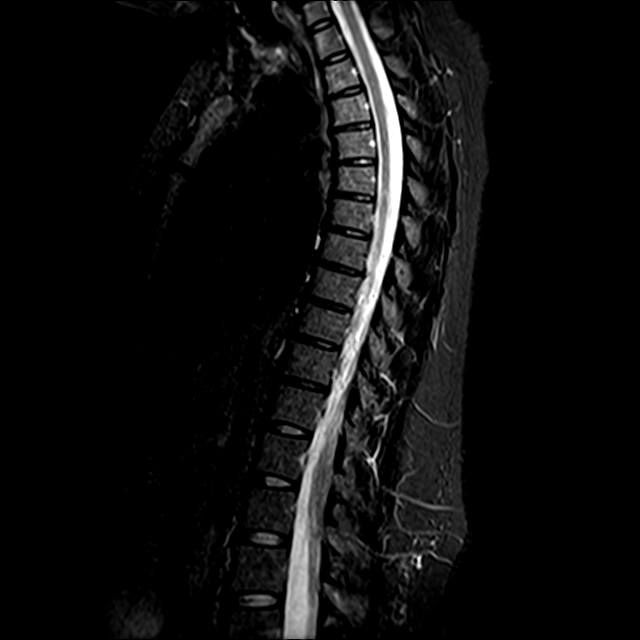
[im 13/17]
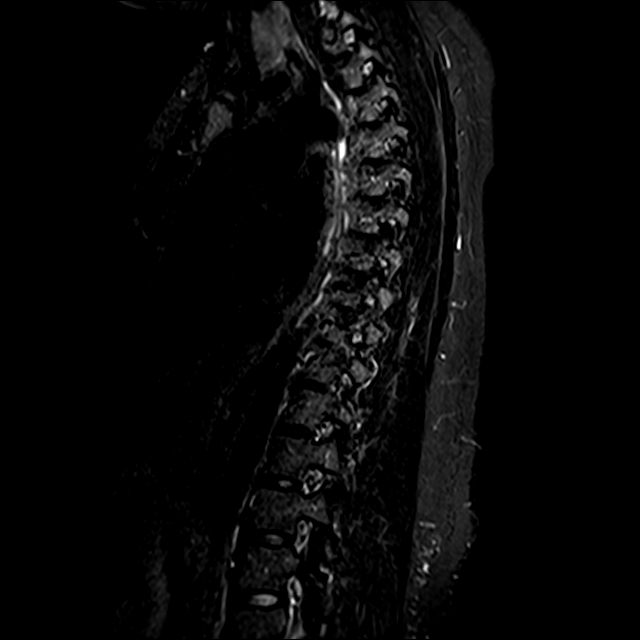
[im 17/17]
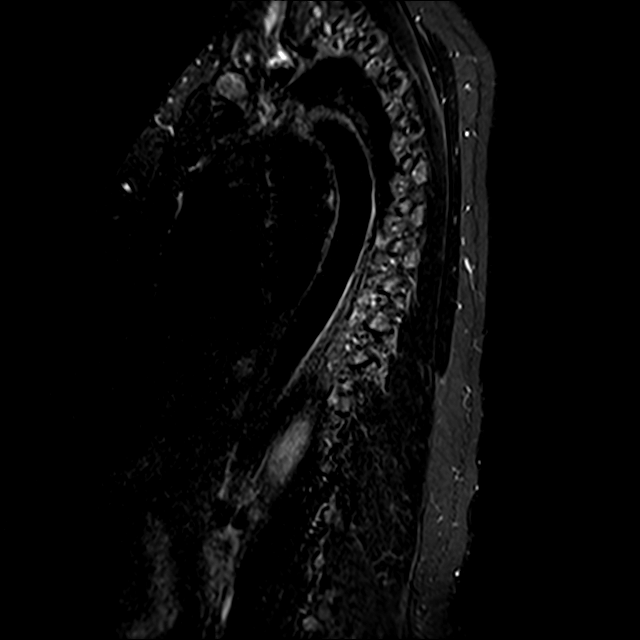

[Series 20: T2 · axial · 4.0mm · 0.59mm/px · z∈[-336,-131]mm · 8 of 39 slices shown (2 of 2)]
[im 1/39]
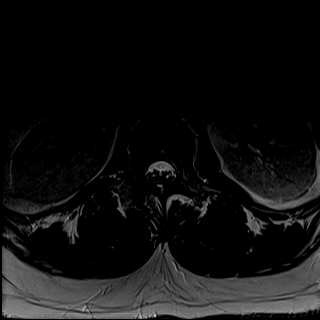
[im 6/39]
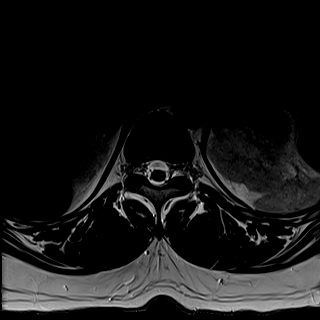
[im 12/39]
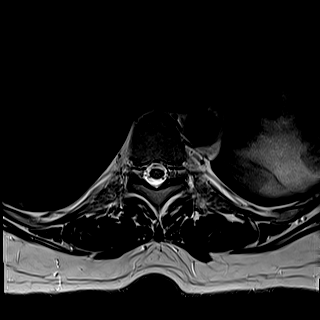
[im 18/39]
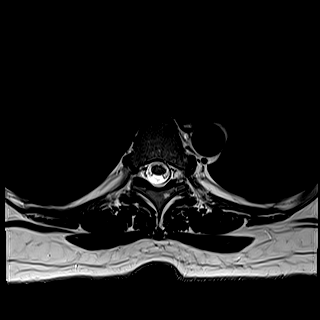
[im 21/39]
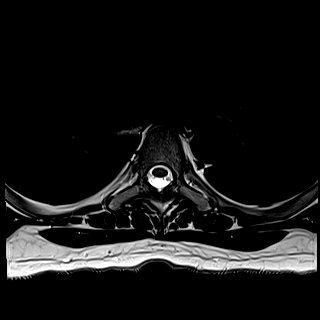
[im 27/39]
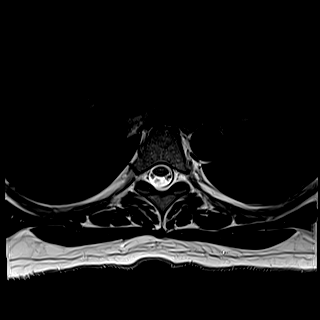
[im 33/39]
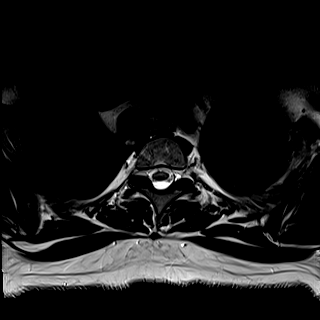
[im 39/39]
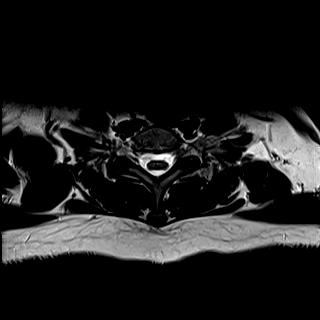

[28 of 48 positions shown; findings below may reference images not displayed]

FINDINGS: MRI CERVICAL SPINE FINDINGS

Alignment: Straightening of cervical lordosis with reversal. No
listhesis.

Vertebrae: Normal bone marrow signal intensity. No focal osseous
lesion.

Cord: Normal signal and morphology.

Posterior Fossa, vertebral arteries: Negative.

Disc levels: Multilevel endplate osteophytosis, desiccation and mild
disc space loss most prominent at C4-C7 levels.

C2-3: Small central protrusion with uncovertebral degenerative
spurring. No significant spinal canal or neural foraminal narrowing.

C3-4: Small disc osteophyte complex with superimposed central
protrusion. Mild uncovertebral and bilateral facet hypertrophy. Mild
left neural foraminal narrowing. No significant spinal canal
narrowing.

C4-5: Central protrusion abutting the ventral cord with
uncovertebral and bilateral facet hypertrophy. Mild spinal canal
narrowing. No significant neural foraminal narrowing.

C5-6: Disc osteophyte complex abutting the ventral cord with mild
uncovertebral and bilateral facet hypertrophy. Mild spinal canal and
bilateral neural foraminal narrowing.

C6-7: Disc osteophyte complex with superimposed central and left
subarticular protrusions effacing the left lateral recess, bilateral
uncovertebral and facet hypertrophy. Mild spinal canal and left
neural foraminal narrowing.

C7-T1: No significant disc bulge, spinal canal or neural foraminal
narrowing.

Paraspinal tissues: Negative.

MRI THORACIC SPINE FINDINGS

Alignment:  Normal.

Vertebrae: Normal bone marrow signal intensity. No focal osseous
lesions.

Cord:  Normal signal and morphology.

Paraspinal and other soft tissues: Negative.

Disc levels:

Small posterior disc bulge and right facet hypertrophy resulting in
mild left neural foraminal narrowing at the T2-3 level. No
significant spinal canal or neural foraminal narrowing at the
remaining thoracic levels.
IMPRESSION: Multilevel cervical spondylosis with mild C4-7 spinal canal
narrowing.

Mild neural foraminal narrowing at the C3-4, C5-6 and C6-7 levels.

Mild right T2-3 neural foraminal narrowing.

## 2019-11-12 IMAGING — MR MR CERVICAL SPINE W/O CM
5 series · 37 of 48 positions shown · non-contrast
Comparison: [DATE] and [DATE] cervical spine radiographs.

CLINICAL DATA: Neck pain.

EXAM:
MRI CERVICAL AND THORACIC SPINE WITHOUT CONTRAST
TECHNIQUE: Multiplanar and multiecho pulse sequences of the cervical spine, to
include the craniocervical junction and cervicothoracic junction,
and the thoracic spine, were obtained without intravenous contrast.

[Series 5: T2 · sagittal · 3.0mm · 0.62mm/px · 6 of 15 slices shown (1 of 2)]
[im 1/15]
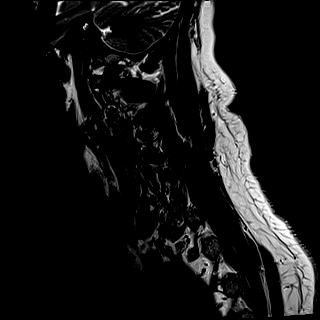
[im 3/15]
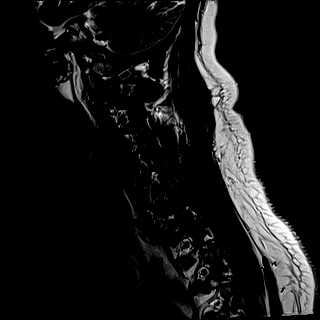
[im 6/15]
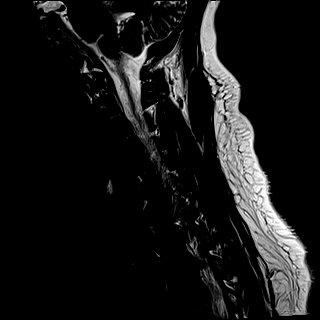
[im 9/15]
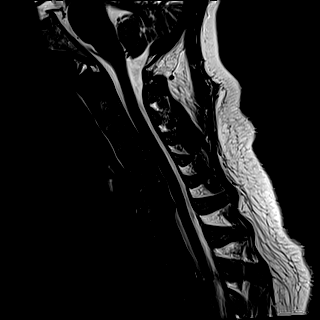
[im 12/15]
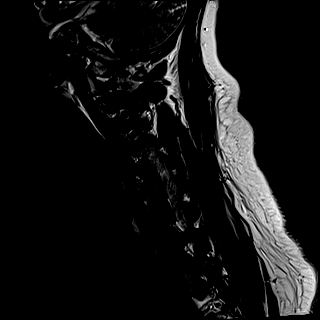
[im 15/15]
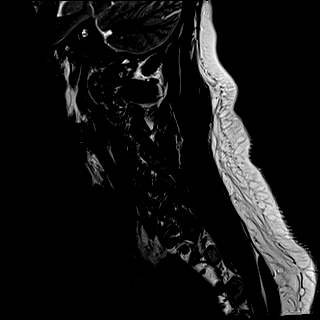

[Series 6: FLAIR · sagittal · 3.0mm · 0.78mm/px · 7 of 15 slices shown]
[im 1/15]
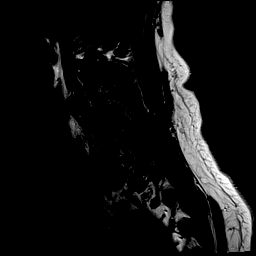
[im 3/15]
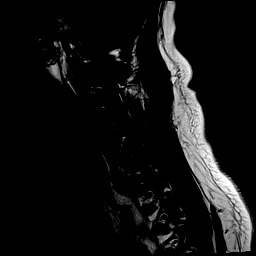
[im 5/15]
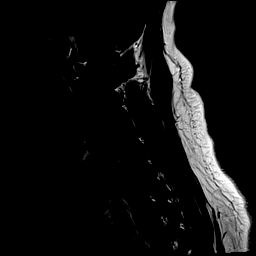
[im 8/15]
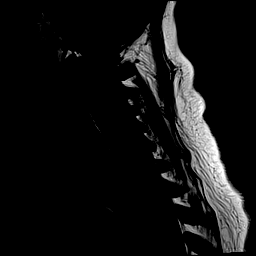
[im 10/15]
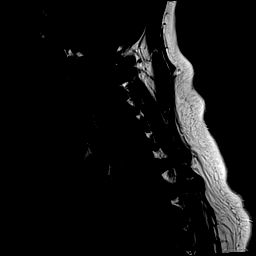
[im 12/15]
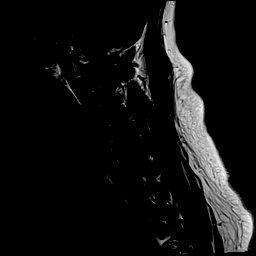
[im 15/15]
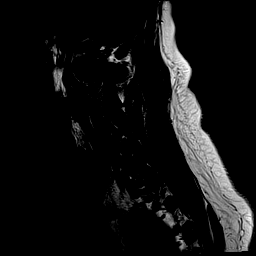

[Series 7: STIR · sagittal · 3.0mm · 0.62mm/px · 7 of 15 slices shown]
[im 1/15]
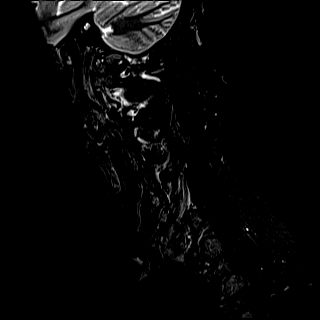
[im 3/15]
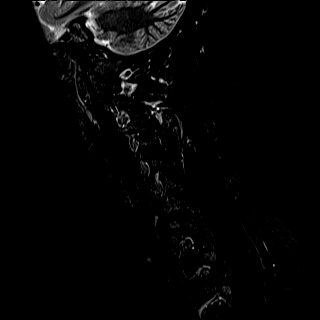
[im 5/15]
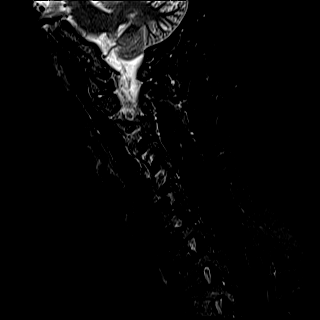
[im 8/15]
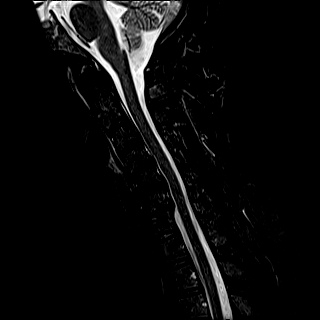
[im 10/15]
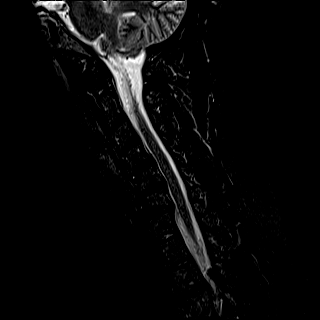
[im 12/15]
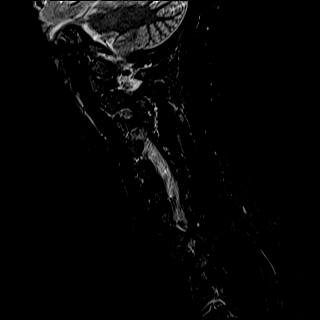
[im 15/15]
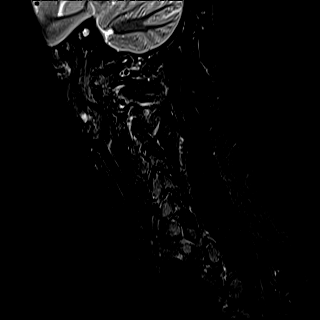

[Series 8: T2 · axial · 3.0mm · 0.70mm/px · z∈[-135,-44]mm · 9 of 29 slices shown (2 of 2)]
[im 1/29]
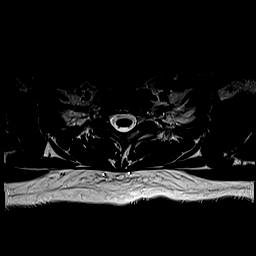
[im 3/29]
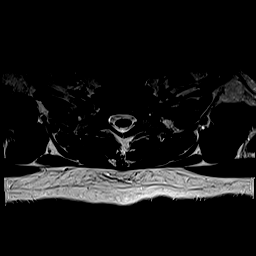
[im 5/29]
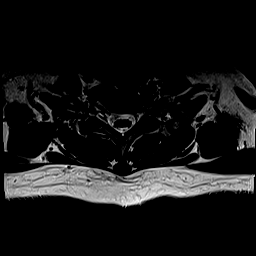
[im 9/29]
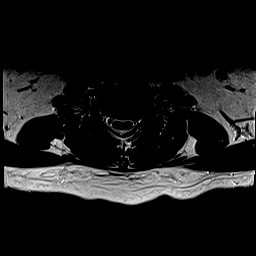
[im 13/29]
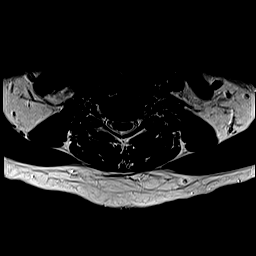
[im 16/29]
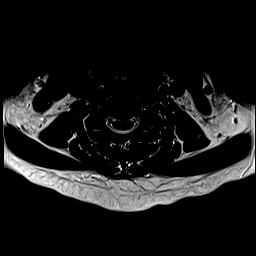
[im 20/29]
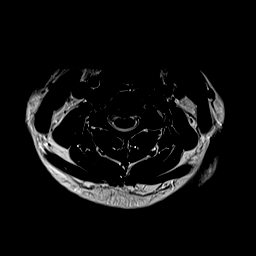
[im 24/29]
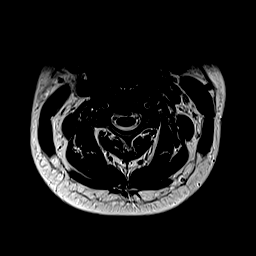
[im 29/29]
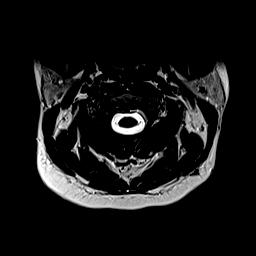

[Series 9: ax mpgr · axial · 3.0mm · 0.35mm/px · z∈[-135,-44]mm · 8 of 29 slices shown]
[im 1/29]
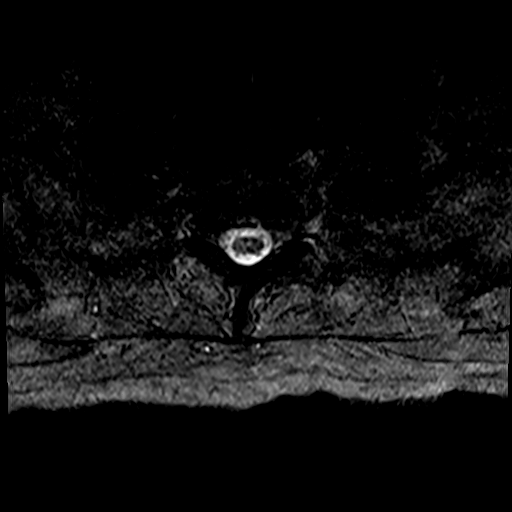
[im 5/29]
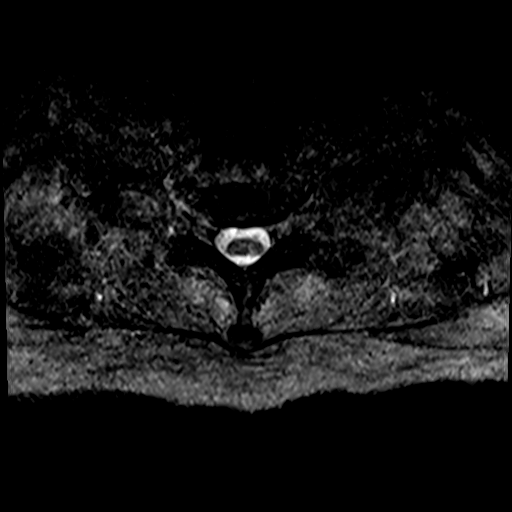
[im 9/29]
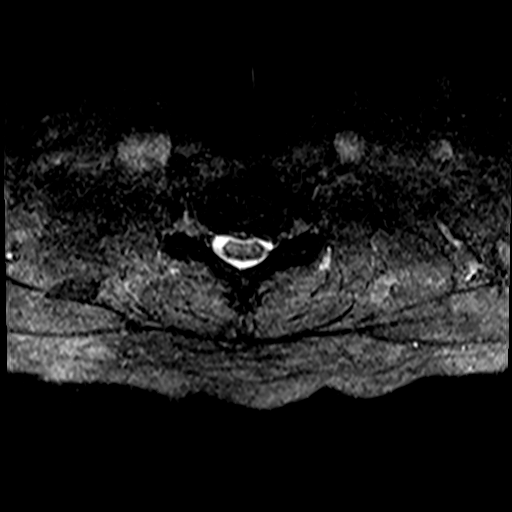
[im 13/29]
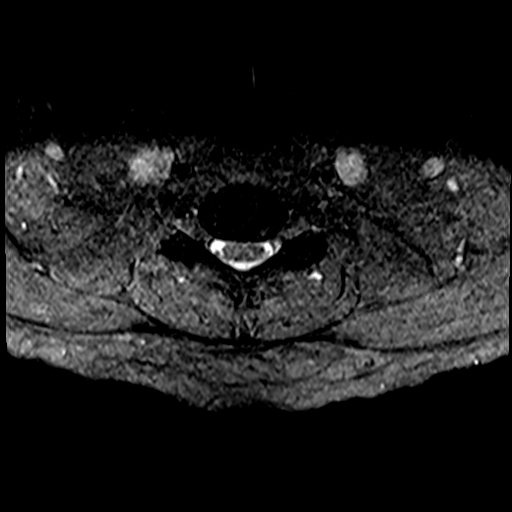
[im 16/29]
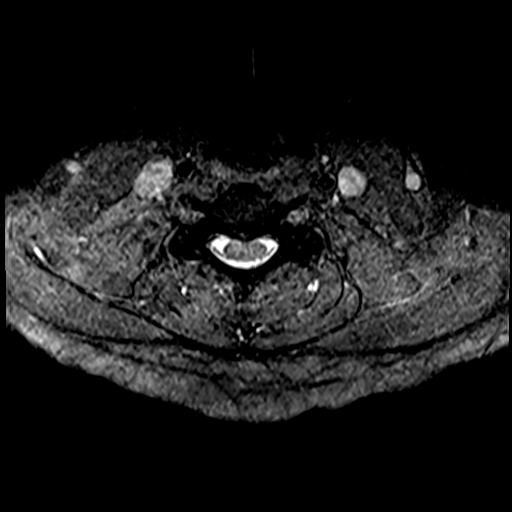
[im 20/29]
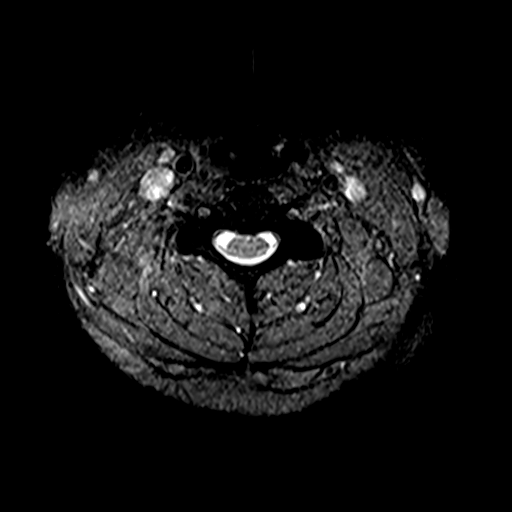
[im 24/29]
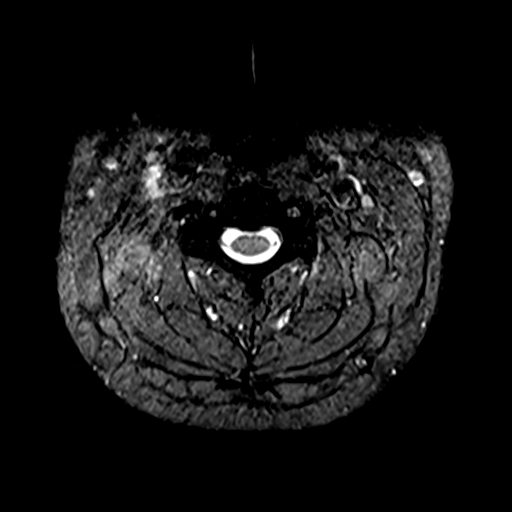
[im 29/29]
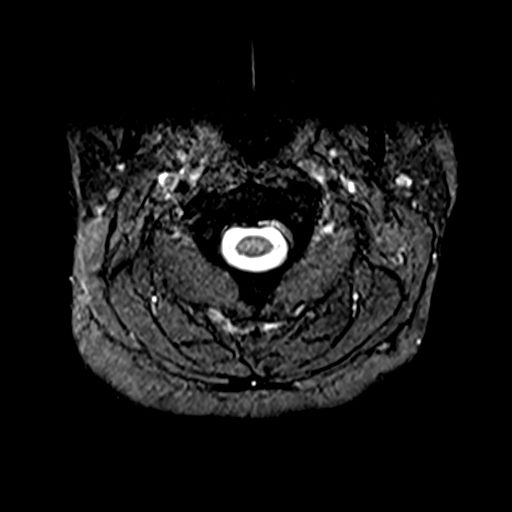

[37 of 48 positions shown; findings below may reference images not displayed]

FINDINGS: MRI CERVICAL SPINE FINDINGS

Alignment: Straightening of cervical lordosis with reversal. No
listhesis.

Vertebrae: Normal bone marrow signal intensity. No focal osseous
lesion.

Cord: Normal signal and morphology.

Posterior Fossa, vertebral arteries: Negative.

Disc levels: Multilevel endplate osteophytosis, desiccation and mild
disc space loss most prominent at C4-C7 levels.

C2-3: Small central protrusion with uncovertebral degenerative
spurring. No significant spinal canal or neural foraminal narrowing.

C3-4: Small disc osteophyte complex with superimposed central
protrusion. Mild uncovertebral and bilateral facet hypertrophy. Mild
left neural foraminal narrowing. No significant spinal canal
narrowing.

C4-5: Central protrusion abutting the ventral cord with
uncovertebral and bilateral facet hypertrophy. Mild spinal canal
narrowing. No significant neural foraminal narrowing.

C5-6: Disc osteophyte complex abutting the ventral cord with mild
uncovertebral and bilateral facet hypertrophy. Mild spinal canal and
bilateral neural foraminal narrowing.

C6-7: Disc osteophyte complex with superimposed central and left
subarticular protrusions effacing the left lateral recess, bilateral
uncovertebral and facet hypertrophy. Mild spinal canal and left
neural foraminal narrowing.

C7-T1: No significant disc bulge, spinal canal or neural foraminal
narrowing.

Paraspinal tissues: Negative.

MRI THORACIC SPINE FINDINGS

Alignment:  Normal.

Vertebrae: Normal bone marrow signal intensity. No focal osseous
lesions.

Cord:  Normal signal and morphology.

Paraspinal and other soft tissues: Negative.

Disc levels:

Small posterior disc bulge and right facet hypertrophy resulting in
mild left neural foraminal narrowing at the T2-3 level. No
significant spinal canal or neural foraminal narrowing at the
remaining thoracic levels.
IMPRESSION: Multilevel cervical spondylosis with mild C4-7 spinal canal
narrowing.

Mild neural foraminal narrowing at the C3-4, C5-6 and C6-7 levels.

Mild right T2-3 neural foraminal narrowing.

## 2019-11-18 DIAGNOSIS — R531 Weakness: Secondary | ICD-10-CM | POA: Diagnosis not present

## 2019-11-18 DIAGNOSIS — R262 Difficulty in walking, not elsewhere classified: Secondary | ICD-10-CM | POA: Diagnosis not present

## 2019-11-18 DIAGNOSIS — M545 Low back pain: Secondary | ICD-10-CM | POA: Diagnosis not present

## 2019-11-18 DIAGNOSIS — R519 Headache, unspecified: Secondary | ICD-10-CM | POA: Diagnosis not present

## 2020-01-01 ENCOUNTER — Ambulatory Visit: Payer: Self-pay | Admitting: Physical Therapy

## 2020-01-01 ENCOUNTER — Other Ambulatory Visit: Payer: Self-pay

## 2020-01-05 ENCOUNTER — Ambulatory Visit: Payer: Self-pay | Admitting: Physical Therapy

## 2020-02-16 DIAGNOSIS — M25571 Pain in right ankle and joints of right foot: Secondary | ICD-10-CM | POA: Diagnosis not present

## 2020-03-02 DIAGNOSIS — M659 Synovitis and tenosynovitis, unspecified: Secondary | ICD-10-CM | POA: Diagnosis not present

## 2020-03-02 DIAGNOSIS — M25371 Other instability, right ankle: Secondary | ICD-10-CM | POA: Diagnosis not present

## 2020-03-23 DIAGNOSIS — Z7189 Other specified counseling: Secondary | ICD-10-CM | POA: Diagnosis not present

## 2020-03-23 DIAGNOSIS — R0601 Orthopnea: Secondary | ICD-10-CM | POA: Diagnosis not present

## 2020-03-23 DIAGNOSIS — R05 Cough: Secondary | ICD-10-CM | POA: Diagnosis not present

## 2020-03-23 DIAGNOSIS — Z20828 Contact with and (suspected) exposure to other viral communicable diseases: Secondary | ICD-10-CM | POA: Diagnosis not present

## 2020-03-23 DIAGNOSIS — Z8669 Personal history of other diseases of the nervous system and sense organs: Secondary | ICD-10-CM | POA: Diagnosis not present

## 2020-04-05 DIAGNOSIS — U071 COVID-19: Secondary | ICD-10-CM | POA: Diagnosis not present

## 2020-04-05 DIAGNOSIS — Z20828 Contact with and (suspected) exposure to other viral communicable diseases: Secondary | ICD-10-CM | POA: Diagnosis not present

## 2020-05-04 DIAGNOSIS — N390 Urinary tract infection, site not specified: Secondary | ICD-10-CM | POA: Diagnosis not present

## 2020-05-06 DIAGNOSIS — R410 Disorientation, unspecified: Secondary | ICD-10-CM | POA: Diagnosis not present

## 2020-05-06 DIAGNOSIS — R4182 Altered mental status, unspecified: Secondary | ICD-10-CM | POA: Diagnosis not present

## 2020-05-06 DIAGNOSIS — R51 Headache with orthostatic component, not elsewhere classified: Secondary | ICD-10-CM | POA: Diagnosis not present

## 2020-05-18 DIAGNOSIS — Z79899 Other long term (current) drug therapy: Secondary | ICD-10-CM | POA: Diagnosis not present

## 2020-05-18 DIAGNOSIS — F419 Anxiety disorder, unspecified: Secondary | ICD-10-CM | POA: Diagnosis not present

## 2020-05-18 DIAGNOSIS — F339 Major depressive disorder, recurrent, unspecified: Secondary | ICD-10-CM | POA: Diagnosis not present

## 2020-09-05 ENCOUNTER — Other Ambulatory Visit (HOSPITAL_COMMUNITY): Payer: Self-pay | Admitting: Nurse Practitioner

## 2020-09-05 DIAGNOSIS — F411 Generalized anxiety disorder: Secondary | ICD-10-CM | POA: Diagnosis not present

## 2020-09-05 DIAGNOSIS — Z79899 Other long term (current) drug therapy: Secondary | ICD-10-CM | POA: Diagnosis not present

## 2020-09-05 DIAGNOSIS — F329 Major depressive disorder, single episode, unspecified: Secondary | ICD-10-CM | POA: Diagnosis not present

## 2020-09-08 DIAGNOSIS — Z6834 Body mass index (BMI) 34.0-34.9, adult: Secondary | ICD-10-CM | POA: Diagnosis not present

## 2020-09-08 DIAGNOSIS — R0600 Dyspnea, unspecified: Secondary | ICD-10-CM | POA: Diagnosis not present

## 2020-09-08 DIAGNOSIS — R0902 Hypoxemia: Secondary | ICD-10-CM | POA: Diagnosis not present

## 2020-09-08 DIAGNOSIS — I313 Pericardial effusion (noninflammatory): Secondary | ICD-10-CM | POA: Diagnosis not present

## 2020-09-08 DIAGNOSIS — U071 COVID-19: Secondary | ICD-10-CM | POA: Diagnosis not present

## 2020-09-08 DIAGNOSIS — Z20822 Contact with and (suspected) exposure to covid-19: Secondary | ICD-10-CM | POA: Diagnosis not present

## 2020-09-08 DIAGNOSIS — F17210 Nicotine dependence, cigarettes, uncomplicated: Secondary | ICD-10-CM | POA: Diagnosis not present

## 2020-09-08 DIAGNOSIS — J9600 Acute respiratory failure, unspecified whether with hypoxia or hypercapnia: Secondary | ICD-10-CM | POA: Diagnosis not present

## 2020-09-08 DIAGNOSIS — Z8616 Personal history of COVID-19: Secondary | ICD-10-CM | POA: Diagnosis not present

## 2020-09-13 ENCOUNTER — Other Ambulatory Visit (HOSPITAL_COMMUNITY): Payer: Self-pay | Admitting: Cardiology

## 2020-09-13 DIAGNOSIS — Z8616 Personal history of COVID-19: Secondary | ICD-10-CM | POA: Diagnosis not present

## 2020-09-13 DIAGNOSIS — I313 Pericardial effusion (noninflammatory): Secondary | ICD-10-CM | POA: Diagnosis not present

## 2020-09-13 DIAGNOSIS — R0902 Hypoxemia: Secondary | ICD-10-CM | POA: Diagnosis not present

## 2020-09-13 DIAGNOSIS — Z7689 Persons encountering health services in other specified circumstances: Secondary | ICD-10-CM | POA: Diagnosis not present

## 2020-09-20 DIAGNOSIS — Z6834 Body mass index (BMI) 34.0-34.9, adult: Secondary | ICD-10-CM | POA: Diagnosis not present

## 2020-09-20 DIAGNOSIS — R61 Generalized hyperhidrosis: Secondary | ICD-10-CM | POA: Diagnosis not present

## 2020-09-27 DIAGNOSIS — Z1231 Encounter for screening mammogram for malignant neoplasm of breast: Secondary | ICD-10-CM | POA: Diagnosis not present

## 2020-09-27 DIAGNOSIS — Z6835 Body mass index (BMI) 35.0-35.9, adult: Secondary | ICD-10-CM | POA: Diagnosis not present

## 2020-09-27 DIAGNOSIS — R61 Generalized hyperhidrosis: Secondary | ICD-10-CM | POA: Diagnosis not present

## 2020-09-28 ENCOUNTER — Ambulatory Visit: Payer: Self-pay | Admitting: Gastroenterology

## 2020-10-20 ENCOUNTER — Ambulatory Visit (INDEPENDENT_AMBULATORY_CARE_PROVIDER_SITE_OTHER): Payer: Self-pay | Admitting: Nurse Practitioner

## 2020-10-20 ENCOUNTER — Other Ambulatory Visit: Payer: Self-pay | Admitting: Nurse Practitioner

## 2020-10-20 VITALS — BP 131/100 | HR 83 | Temp 97.7°F | Resp 18

## 2020-10-20 DIAGNOSIS — I3139 Other pericardial effusion (noninflammatory): Secondary | ICD-10-CM

## 2020-10-20 DIAGNOSIS — R059 Cough, unspecified: Secondary | ICD-10-CM

## 2020-10-20 DIAGNOSIS — Z8616 Personal history of COVID-19: Secondary | ICD-10-CM

## 2020-10-20 DIAGNOSIS — F419 Anxiety disorder, unspecified: Secondary | ICD-10-CM

## 2020-10-20 DIAGNOSIS — I313 Pericardial effusion (noninflammatory): Secondary | ICD-10-CM

## 2020-10-20 DIAGNOSIS — F424 Excoriation (skin-picking) disorder: Secondary | ICD-10-CM

## 2020-10-20 DIAGNOSIS — R4189 Other symptoms and signs involving cognitive functions and awareness: Secondary | ICD-10-CM

## 2020-10-20 MED ORDER — ALBUTEROL SULFATE HFA 108 (90 BASE) MCG/ACT IN AERS: 2.0000 | INHALATION_SPRAY | RESPIRATORY_TRACT | 0 refills | Status: DC | PRN

## 2020-10-20 MED ORDER — PREDNISONE 10 MG PO TABS: ORAL_TABLET | ORAL | 0 refills | Status: DC

## 2020-10-20 MED FILL — predniSONE 10 MG TABS: 10 | 8 days supply | Qty: 20 | Fill #0

## 2020-10-20 MED FILL — ALBUTEROL SULFATE HFA 108 (: 108 (90 BAS | 16 days supply | Qty: 9 | Fill #0

## 2020-10-20 NOTE — Progress Notes (Signed)
@Patient  ID: Desiree Russell, female    DOB: 1971-06-26, 50 y.o.   MRN: [id number]  Chief Complaint  Patient presents with  . history of covid    Cough, wheezing, shortness of breath, brain fob, headache, tested positive 08/11/20.    Referring provider: Rip Harbour, NP     HPI  Patient presents today for post [facility] clinic visit.  Patient tested positive for Covid on August 11, 2020.  She was seen in the ED on 09/08/2020 that was diagnosed with pericardial effusion.  Patient states that she was supposed to follow-up with cardiology but has not had a referral.  Patient has returned to work.  She is trying to stay active.  She states that she has continuing issues with cough, shortness of breath especially when climbing stairs, wheezing, brain fog, headaches.  She also states that she has been having increased anxiety and has started picking at her skin.  She has several areas to her upper arms and thighs where she has been picking at her skin.  We discussed that we will place a referral to psychiatry. Denies f/c/s, n/v/d, hemoptysis, PND, chest pain or edema.      Allergies  Allergen Reactions  . Morphine And Related Hives  . Oxycodone Hives  . Chlorhexidine Hives    rash rash rash rash   . Codeine     Hives      There is no immunization history on file for this patient.  Past Medical History:  Diagnosis Date  . Anxiety   . Fibromyalgia   . Headache   . Palpitations   . Seizures ([place])     Tobacco History: Social History   Tobacco Use  Smoking Status Never Smoker  Smokeless Tobacco Never Used   Counseling given: Yes   Outpatient Encounter Medications as of 10/20/2020  Medication Sig  . predniSONE (DELTASONE) 10 MG tablet Take 4 tabs for 2 days, then 3 tabs for 2 days, then 2 tabs for 2 days, then 1 tab for 2 days, then stop  . albuterol (VENTOLIN HFA) 108 (90 Base) MCG/ACT inhaler Inhale 2 puffs into the lungs every 4 (four) hours as needed for  wheezing.  Marland Kitchen ALPRAZolam (XANAX) 0.5 MG tablet Take 0.5 mg by mouth every 6 (six) hours as needed for anxiety.  Marland Kitchen aspirin EC 81 MG tablet Take 1 tablet by mouth daily.  Marland Kitchen atorvastatin (LIPITOR) 20 MG tablet Take 20 mg by mouth daily.  . B Complex Vitamins (VITAMIN B COMPLEX PO) Take 1 capsule by mouth daily.  . baclofen (LIORESAL) 10 MG tablet Take 10 mg by mouth 3 (three) times daily.  . cetirizine (ZYRTEC) 10 MG tablet Take 1 tablet by mouth daily.  . Cholecalciferol (VITAMIN D3) 1000 units CAPS Take 1 tablet by mouth daily.  . Colchicine 0.6 MG CAPS Take 1 capsule by mouth 2 (two) times daily.  . Cranberry-Vitamin C-Probiotic (AZO CRANBERRY PO) Take 2 capsules by mouth daily.  . Cyanocobalamin (VITAMIN B-12 IJ) Inject 1,000 mcg as directed every 30 (thirty) days. Take on the first of every month  . diclofenac sodium (VOLTAREN) 1 % GEL APPLY TO CHEST TWO TIMES DAILY AS NEEDED  . DULoxetine (CYMBALTA) 60 MG capsule Take 2 capsules by mouth daily.  Marland Kitchen esomeprazole (NEXIUM) 20 MG packet Take 20 mg by mouth daily before breakfast.  . gabapentin (NEURONTIN) 300 MG capsule Take 300 mg by mouth 2 (two) times daily.  . Galcanezumab-gnlm (EMGALITY) 120 MG/ML SOAJ Inject 1  pen into the skin every 28 (twenty-eight) days.  Marland Kitchen ibuprofen (ADVIL,MOTRIN) 800 MG tablet Take 800 mg by mouth every 8 (eight) hours as needed.  . lactobacillus acidophilus (BACID) TABS tablet Take 2 tablets by mouth 3 (three) times daily.  . metoprolol (LOPRESSOR) 50 MG tablet Take 50 mg by mouth 2 (two) times daily.   . ondansetron (ZOFRAN) 4 MG tablet Take 4 mg by mouth every 8 (eight) hours as needed for nausea or vomiting.  . rizatriptan (MAXALT) 10 MG tablet TAKE 1 TABLET BY MOUTH ONCE DAILY AS NEEDED FOR MIGRAINE  . sennosides-docusate sodium (SENOKOT-S) 8.6-50 MG tablet Take 12 tablets by mouth daily as needed for constipation.  . topiramate (TOPAMAX) 100 MG tablet Take by mouth.  . valACYclovir (VALTREX) 500 MG tablet  valacyclovir 500 mg tablet  . [DISCONTINUED] albuterol (VENTOLIN HFA) 108 (90 Base) MCG/ACT inhaler Inhale 2 puffs into the lungs every 4 (four) hours as needed for wheezing.   No facility-administered encounter medications on file as of 10/20/2020.     Review of Systems  Review of Systems  Constitutional: Negative.  Negative for fatigue and fever.  HENT: Negative.   Respiratory: Positive for cough and shortness of breath.   Cardiovascular: Negative.  Negative for chest pain, palpitations and leg swelling.  Gastrointestinal: Negative.   Allergic/Immunologic: Negative.   Neurological: Positive for headaches.  Psychiatric/Behavioral: Positive for self-injury. The patient is nervous/anxious.        Physical Exam  BP (!) 131/100   Pulse 83   Temp 97.7 F (36.5 C)   Resp 18   SpO2 100% Comment: RA  Wt Readings from Last 5 Encounters:  03/14/18 208 lb (94.3 kg)     Physical Exam Vitals and nursing note reviewed.  Constitutional:      General: She is not in acute distress.    Appearance: She is well-developed.  Cardiovascular:     Rate and Rhythm: Normal rate and regular rhythm.  Pulmonary:     Effort: Pulmonary effort is normal.     Breath sounds: Normal breath sounds.  Musculoskeletal:     Right lower leg: No edema.     Left lower leg: No edema.  Neurological:     Mental Status: She is alert and oriented to person, place, and time.  Psychiatric:        Mood and Affect: Mood normal.        Behavior: Behavior normal.       Imaging: DG Chest 2 View  Result Date: 10/23/2020 CLINICAL DATA:  Cough, history of COVID EXAM: CHEST - 2 VIEW COMPARISON:  Chest radiograph and CT chest September 08, 2020 FINDINGS: The heart size and mediastinal contours are within normal limits. No focal consolidation. No pleural effusion. No pneumothorax. The visualized skeletal structures are unremarkable. Cholecystectomy clips. IMPRESSION: No active cardiopulmonary disease. Electronically  Signed   By: Dahlia Bailiff MD   On: 10/23/2020 02:28     Assessment & Plan:   History of COVID-19 History of Covid  Cough:   Stay well hydrated  Stay active  Deep breathing exercises  May take tylenol or fever or pain  May take mucinex DM twice daily  Will order chest x ray:  [facility] [address], [place] [address] [phone] MON - FRI 8:00 AM - 4:00 PM - WALK IN  Will order prednisone   Pericardial effusion:  Will place referral to cardiology   Brain Fog:  Will place referral to speech therapy for  cognitive rehabilitation    Headaches:  Continue current medications  May start magnesium 600 mg daily  May start riboflavin 400 mg daily   Skin Picking / anxiety:  Will place referral to psychiatry  Follow up:  Follow up in 1 month   Patient Instructions  History of Covid  Cough:   Stay well hydrated  Stay active  Deep breathing exercises  May take tylenol or fever or pain  May take mucinex DM twice daily  Will order chest x ray:  [facility] [address], [place] [address] [phone] MON - FRI 8:00 AM - 4:00 PM - WALK IN  Will order prednisone   Pericardial effusion:  Will place referral to cardiology   Brain Fog:  Will place referral to speech therapy for cognitive rehabilitation    Headaches:  Continue current medications  May start magnesium 600 mg daily  May start riboflavin 400 mg daily   Skin-Picking Disorder  Skin-picking (excoriation) disorder is a mental disorder of picking at one's skin without being aware of it (unconsciously). If you have skin-picking disorder, you may keep picking at your skin even when it causes infections or stress or it hurts your social and emotional well-being. Skin-picking disorder can develop at any age, but it most commonly appears first during adolescence. What are the causes? Skin-picking disorder is more likely to be related to a type  of obsessive-compulsive disorder (OCD), rather than a bigger skin problem.  OCD involves having unwanted and distressing thoughts, ideas, or urges (obsessions), and doing repetitive physical or mental acts (compulsions) that bring a temporary sense of relief or calming.  Compulsions of OCD may be briefly calming, but a person is likely to feel distress over the consequences of these acts. People with skin-picking disorder may have other mental health concerns like depression or anxiety, which also may need treatment. The cause of this condition is not known. It often develops as a result of:  Unresolved stress.  A skin injury or infection that causes itchiness or a scab. Acne is one example. What increases the risk? You are more likely to develop this condition if:  You have OCD.  You have body dysmorphic disorder (BDD). This is a mental health condition that involves an obsession with how you look.  You use or have used methamphetamine or cocaine.  You are female. What are the signs or symptoms? Symptoms of this condition include:  Unconsciously and repeatedly picking at your skin. This may be done in order to feel some relief from stress or other emotional difficulties.  Harming your body in noticeable ways. In severe cases, sharp objects may be used.  Inability to stop yourself from picking at your skin. How is this diagnosed? To diagnose skin-picking disorder, your health care provider may perform a physical exam to rule out skin disorders, and ask questions about:  Your mental health and well-being, including stressful events you have experienced that may have triggered your condition.  Any strong emotions you have trouble dealing with, which lead to skin picking.  Any history of substance abuse problems.  Any history of skin injury or infection that may have triggered your condition. You may be referred to a mental health care provider or a skin specialist (dermatologist)  to confirm a diagnosis and determine what treatment is best for you. How is this treated? The first step in treatment is to make yourself aware of your disorder and the problems it causes. Treatment options may include:  Cognitive behavioral therapy (CBT). This focuses on identifying and changing problematic thoughts and behaviors that cause your condition.  Mindfulness-based cognitive therapy. This type of CBT emphasizes focusing your attention, meditating, and developing awareness of the present moment (mindfulness).  Habit reversal therapy. This focuses on becoming aware of your picking and learning to use relaxation techniques or do something else instead of picking.  Acceptance and Commitment Therapy (ACT). This focuses on accepting yourself as you are and setting goals for making changes.  Antidepressant medicines.  Working with a dermatologist to treat any skin problems caused by the disorder. Follow these instructions at home:  Take over-the-counter and prescription medicines only as told by your health care provider.  Become aware of what triggers you to pick at your skin, and try to avoid those triggers. Develop skills to deal with issues that trigger picking.  If you feel an urge to pick at your skin, use strategies to stop yourself. Therapy can help you develop strategies, such as: ? Taking a deep breath. ? Naming the feelings you are having. ? Keeping your hands busy.  Consider joining a support group for people who have skin-picking disorder. This can help overcome shame that you might feel and help you cope with your condition.  Keep all follow-up visits as told by your health care provider or therapist, including therapy and dermatology visits. This is important. Contact a health care provider if:  Your emotions or stress has become overwhelming.  You develop signs of infection in an area that is affected by picking. Signs of infection may  include: ? Redness. ? Swelling. ? Pain. ? Warmth. ? Fluid or pus drainage. ? Fever. Get help right away if:  You have thoughts about hurting yourself or others. If you ever feel like you may hurt yourself or others, or have thoughts about taking your own life, get help right away. You can go to your nearest emergency department or call:  Your local emergency services (911 in the [place]  A suicide crisis helpline, such as the [place] at 564-757-2389. This is open 24 hours a day. Summary  Skin-picking (excoriation) disorder is a mental disorder of picking at one's skin without being aware of it (unconsciously). It is often related to obsessive-compulsive disorder (OCD).  The diagnosis of this disorder is made through both a physical exam and a mental health assessment.  Treatment may involve managing physical skin problems, finding the appropriate mental health treatment, and taking medicine.  Consider joining a support group for people who have skin-picking disorder. This can help overcome shame that you might feel and help you cope with your condition. This information is not intended to replace advice given to you by your health care provider. Make sure you discuss any questions you have with your health care provider. Document Revised: 03/31/2020 Document Reviewed: 03/31/2020 Elsevier Patient Education  [place].   Follow up:  Follow up in 2 weeks or sooner if needed      Fenton Foy, NP 10/24/2020

## 2020-10-20 NOTE — Patient Instructions (Addendum)
History of Covid  Cough:   Stay well hydrated  Stay active  Deep breathing exercises  May take tylenol or fever or pain  May take mucinex DM twice daily  Will order chest x ray:  [facility] [address], [place] [address] [phone] MON - FRI 8:00 AM - 4:00 PM - WALK IN  Will order prednisone   Pericardial effusion:  Will place referral to cardiology   Brain Fog:  Will place referral to speech therapy for cognitive rehabilitation    Headaches:  Continue current medications  May start magnesium 600 mg daily  May start riboflavin 400 mg daily   Skin-Picking Disorder  Skin-picking (excoriation) disorder is a mental disorder of picking at one's skin without being aware of it (unconsciously). If you have skin-picking disorder, you may keep picking at your skin even when it causes infections or stress or it hurts your social and emotional well-being. Skin-picking disorder can develop at any age, but it most commonly appears first during adolescence. What are the causes? Skin-picking disorder is more likely to be related to a type of obsessive-compulsive disorder (OCD), rather than a bigger skin problem.  OCD involves having unwanted and distressing thoughts, ideas, or urges (obsessions), and doing repetitive physical or mental acts (compulsions) that bring a temporary sense of relief or calming.  Compulsions of OCD may be briefly calming, but a person is likely to feel distress over the consequences of these acts. People with skin-picking disorder may have other mental health concerns like depression or anxiety, which also may need treatment. The cause of this condition is not known. It often develops as a result of:  Unresolved stress.  A skin injury or infection that causes itchiness or a scab. Acne is one example. What increases the risk? You are more likely to develop this condition if:  You have OCD.  You have body dysmorphic disorder  (BDD). This is a mental health condition that involves an obsession with how you look.  You use or have used methamphetamine or cocaine.  You are female. What are the signs or symptoms? Symptoms of this condition include:  Unconsciously and repeatedly picking at your skin. This may be done in order to feel some relief from stress or other emotional difficulties.  Harming your body in noticeable ways. In severe cases, sharp objects may be used.  Inability to stop yourself from picking at your skin. How is this diagnosed? To diagnose skin-picking disorder, your health care provider may perform a physical exam to rule out skin disorders, and ask questions about:  Your mental health and well-being, including stressful events you have experienced that may have triggered your condition.  Any strong emotions you have trouble dealing with, which lead to skin picking.  Any history of substance abuse problems.  Any history of skin injury or infection that may have triggered your condition. You may be referred to a mental health care provider or a skin specialist (dermatologist) to confirm a diagnosis and determine what treatment is best for you. How is this treated? The first step in treatment is to make yourself aware of your disorder and the problems it causes. Treatment options may include:  Cognitive behavioral therapy (CBT). This focuses on identifying and changing problematic thoughts and behaviors that cause your condition.  Mindfulness-based cognitive therapy. This type of CBT emphasizes focusing your attention, meditating, and developing awareness of the present moment (mindfulness).  Habit reversal therapy. This focuses on becoming aware of your picking  and learning to use relaxation techniques or do something else instead of picking.  Acceptance and Commitment Therapy (ACT). This focuses on accepting yourself as you are and setting goals for making changes.  Antidepressant  medicines.  Working with a dermatologist to treat any skin problems caused by the disorder. Follow these instructions at home:  Take over-the-counter and prescription medicines only as told by your health care provider.  Become aware of what triggers you to pick at your skin, and try to avoid those triggers. Develop skills to deal with issues that trigger picking.  If you feel an urge to pick at your skin, use strategies to stop yourself. Therapy can help you develop strategies, such as: ? Taking a deep breath. ? Naming the feelings you are having. ? Keeping your hands busy.  Consider joining a support group for people who have skin-picking disorder. This can help overcome shame that you might feel and help you cope with your condition.  Keep all follow-up visits as told by your health care provider or therapist, including therapy and dermatology visits. This is important. Contact a health care provider if:  Your emotions or stress has become overwhelming.  You develop signs of infection in an area that is affected by picking. Signs of infection may include: ? Redness. ? Swelling. ? Pain. ? Warmth. ? Fluid or pus drainage. ? Fever. Get help right away if:  You have thoughts about hurting yourself or others. If you ever feel like you may hurt yourself or others, or have thoughts about taking your own life, get help right away. You can go to your nearest emergency department or call:  Your local emergency services (911 in the [place]  A suicide crisis helpline, such as the [facility] at (478)229-1863. This is open 24 hours a day. Summary  Skin-picking (excoriation) disorder is a mental disorder of picking at one's skin without being aware of it (unconsciously). It is often related to obsessive-compulsive disorder (OCD).  The diagnosis of this disorder is made through both a physical exam and a mental health assessment.  Treatment may involve managing  physical skin problems, finding the appropriate mental health treatment, and taking medicine.  Consider joining a support group for people who have skin-picking disorder. This can help overcome shame that you might feel and help you cope with your condition. This information is not intended to replace advice given to you by your health care provider. Make sure you discuss any questions you have with your health care provider. Document Revised: 03/31/2020 Document Reviewed: 03/31/2020 Elsevier Patient Education  [place].   Follow up:  Follow up in 2 weeks or sooner if needed

## 2020-10-21 ENCOUNTER — Other Ambulatory Visit: Payer: Self-pay

## 2020-10-21 ENCOUNTER — Ambulatory Visit
Admission: RE | Admit: 2020-10-21 | Discharge: 2020-10-21 | Disposition: A | Discharge status: Home / Self Care | Payer: Self-pay | Source: Ambulatory Visit | Attending: Nurse Practitioner | Admitting: Nurse Practitioner

## 2020-10-21 DIAGNOSIS — R059 Cough, unspecified: Secondary | ICD-10-CM | POA: Diagnosis not present

## 2020-10-21 DIAGNOSIS — Z8616 Personal history of COVID-19: Secondary | ICD-10-CM | POA: Diagnosis not present

## 2020-10-21 DIAGNOSIS — Z1231 Encounter for screening mammogram for malignant neoplasm of breast: Secondary | ICD-10-CM | POA: Diagnosis not present

## 2020-10-21 LAB — HM MAMMOGRAPHY: HM Mammogram: NORMAL (ref 0–4)

## 2020-10-21 IMAGING — CR DG CHEST 2V
2 series · 2 of 2 positions shown · non-contrast
Comparison: Chest radiograph and CT chest [DATE]

CLINICAL DATA: Cough, history of COVID

EXAM:
CHEST - 2 VIEW

[w chest pa]
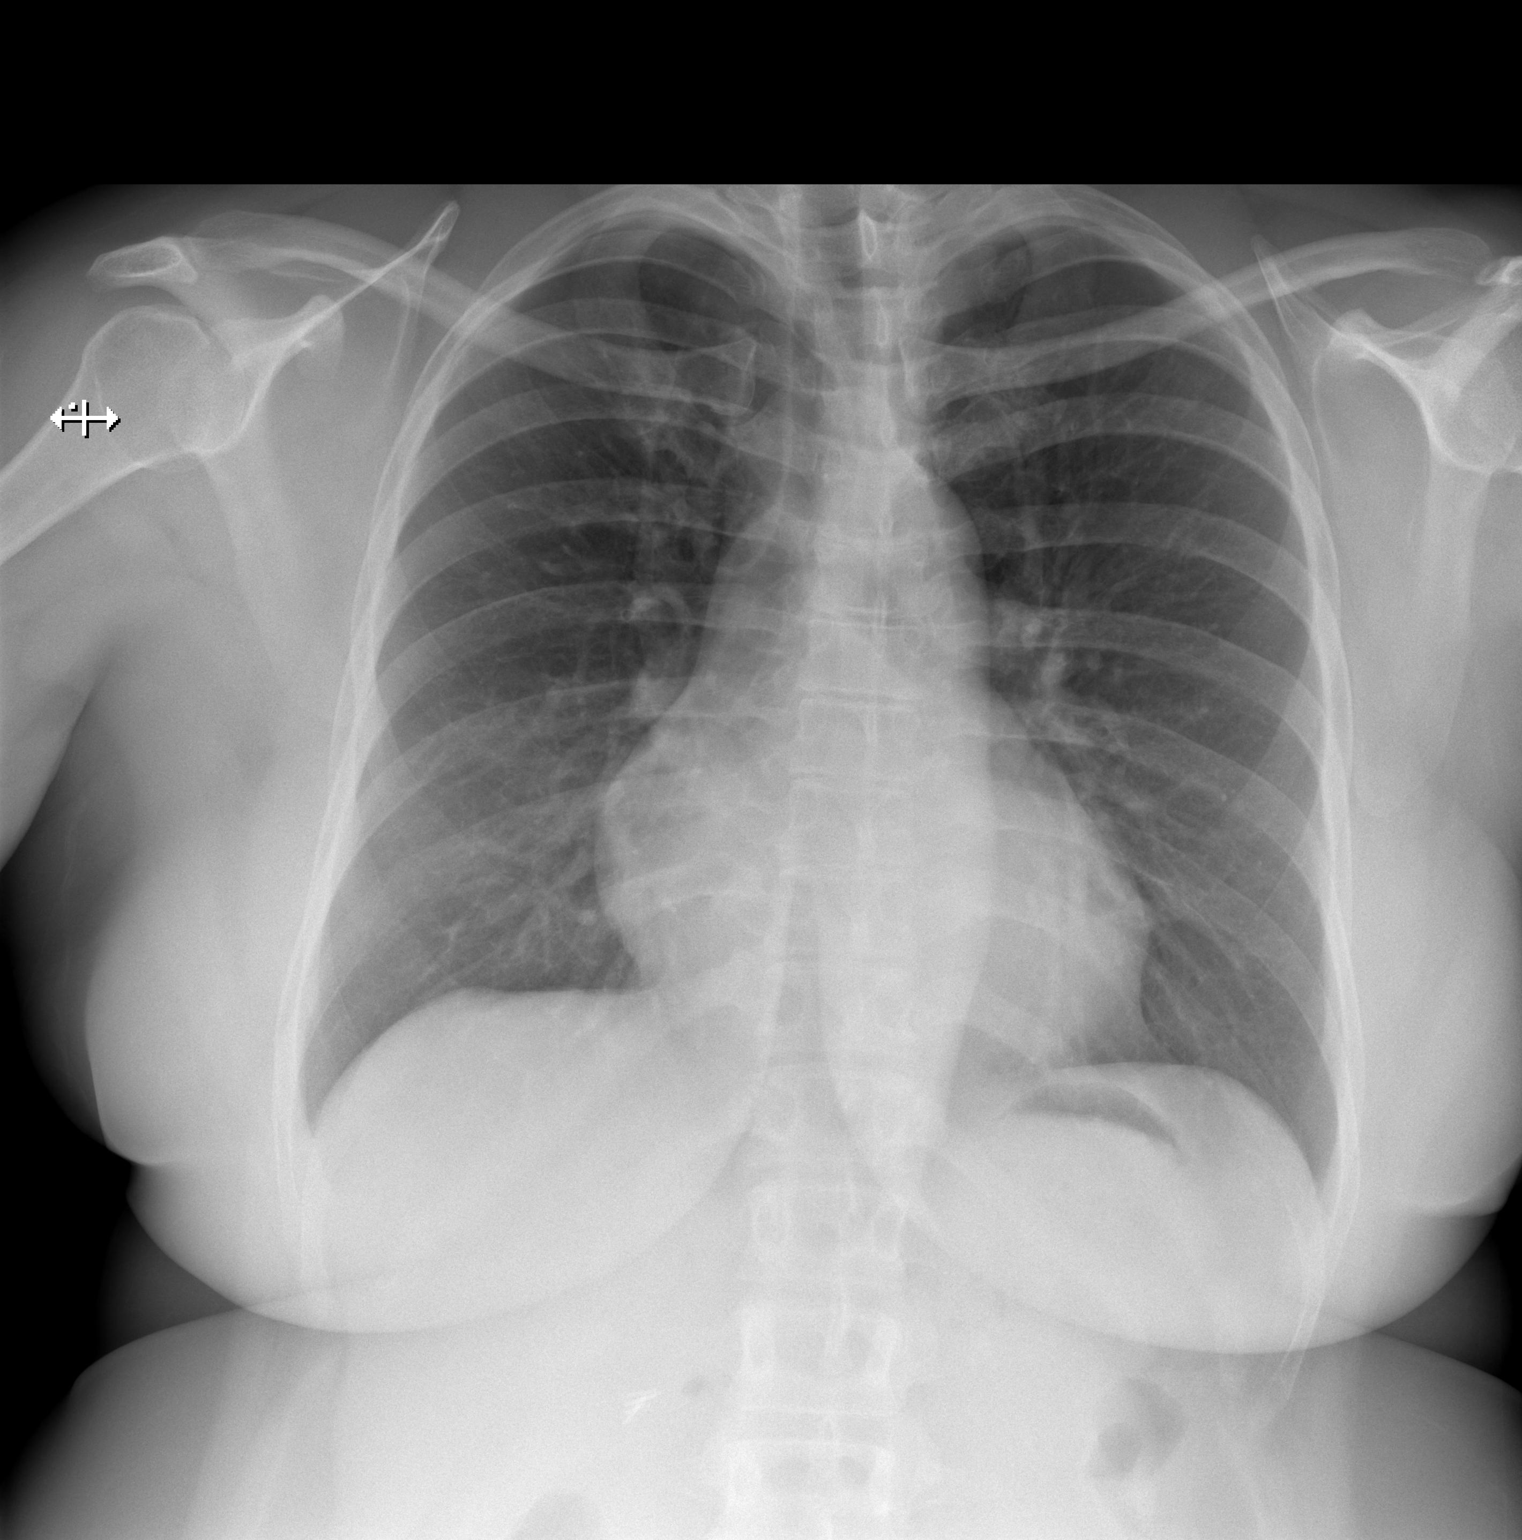

[w chest lat]
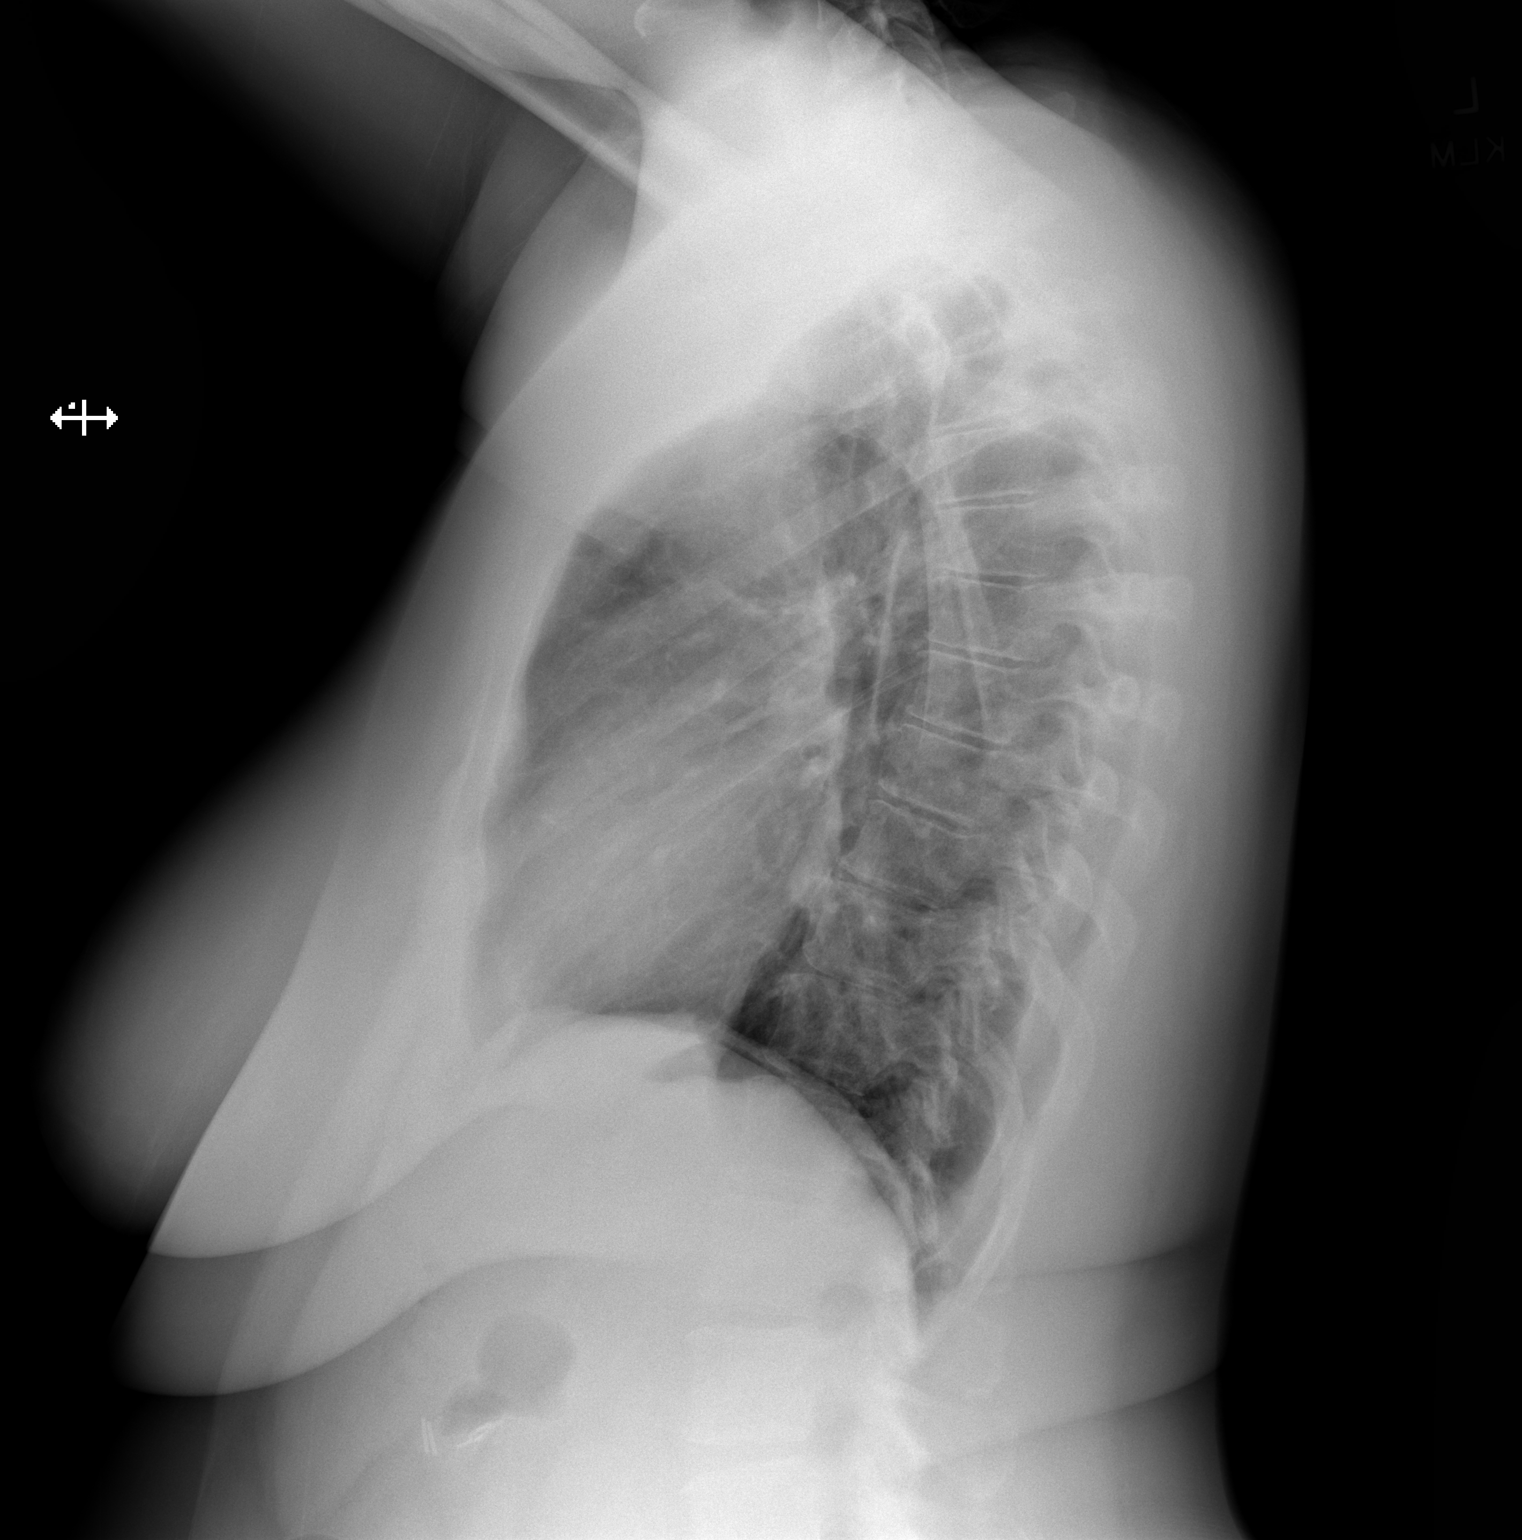

[2 of 2 positions shown; findings below may reference images not displayed]

FINDINGS: The heart size and mediastinal contours are within normal limits. No
focal consolidation. No pleural effusion. No pneumothorax. The
visualized skeletal structures are unremarkable. Cholecystectomy
clips.
IMPRESSION: No active cardiopulmonary disease.

## 2020-10-24 DIAGNOSIS — R059 Cough, unspecified: Secondary | ICD-10-CM | POA: Insufficient documentation

## 2020-10-24 DIAGNOSIS — F424 Excoriation (skin-picking) disorder: Secondary | ICD-10-CM | POA: Insufficient documentation

## 2020-10-24 DIAGNOSIS — R4189 Other symptoms and signs involving cognitive functions and awareness: Secondary | ICD-10-CM | POA: Insufficient documentation

## 2020-10-24 DIAGNOSIS — Z8616 Personal history of COVID-19: Secondary | ICD-10-CM | POA: Insufficient documentation

## 2020-10-24 NOTE — Assessment & Plan Note (Signed)
History of Covid  Cough:   Stay well hydrated  Stay active  Deep breathing exercises  May take tylenol or fever or pain  May take mucinex DM twice daily  Will order chest x ray:  [facility] [address], [place] [address] [phone] MON - FRI 8:00 AM - 4:00 PM - WALK IN  Will order prednisone   Pericardial effusion:  Will place referral to cardiology   Brain Fog:  Will place referral to speech therapy for cognitive rehabilitation    Headaches:  Continue current medications  May start magnesium 600 mg daily  May start riboflavin 400 mg daily   Skin Picking / anxiety:  Will place referral to psychiatry  Follow up:  Follow up in 1 month

## 2020-10-25 ENCOUNTER — Ambulatory Visit: Payer: Self-pay | Admitting: Nurse Practitioner

## 2020-10-26 ENCOUNTER — Encounter (HOSPITAL_COMMUNITY): Payer: Self-pay | Admitting: Psychiatry

## 2020-10-26 ENCOUNTER — Ambulatory Visit (INDEPENDENT_AMBULATORY_CARE_PROVIDER_SITE_OTHER): Payer: 59 | Admitting: Psychiatry

## 2020-10-26 ENCOUNTER — Other Ambulatory Visit: Payer: Self-pay

## 2020-10-26 ENCOUNTER — Other Ambulatory Visit (HOSPITAL_COMMUNITY): Payer: Self-pay | Admitting: Psychiatry

## 2020-10-26 VITALS — Wt 221.0 lb

## 2020-10-26 DIAGNOSIS — F331 Major depressive disorder, recurrent, moderate: Secondary | ICD-10-CM | POA: Diagnosis not present

## 2020-10-26 DIAGNOSIS — F4310 Post-traumatic stress disorder, unspecified: Secondary | ICD-10-CM

## 2020-10-26 DIAGNOSIS — F419 Anxiety disorder, unspecified: Secondary | ICD-10-CM

## 2020-10-26 MED ORDER — ARIPIPRAZOLE 2 MG PO TABS: 2.0000 mg | ORAL_TABLET | Freq: Every day | ORAL | 0 refills | Status: DC

## 2020-10-26 MED ORDER — PREGABALIN 25 MG PO CAPS: 25.0000 mg | ORAL_CAPSULE | Freq: Two times a day (BID) | ORAL | 0 refills | Status: DC

## 2020-10-26 MED ORDER — DULOXETINE HCL 60 MG PO CPEP: 60.0000 mg | ORAL_CAPSULE | Freq: Every day | ORAL | 0 refills | Status: DC

## 2020-10-26 MED ORDER — HYDROXYZINE PAMOATE 25 MG PO CAPS
25.0000 mg | ORAL_CAPSULE | Freq: Every day | ORAL | 0 refills | Status: DC
Start: 2020-10-26 — End: 2020-10-27

## 2020-10-26 MED FILL — PREGABALIN 25 MG CAPS: 25 | 30 days supply | Qty: 60 | Fill #0

## 2020-10-26 MED FILL — HYDROXYZINE PAMOATE 25 MG C: 25 | 30 days supply | Qty: 30 | Fill #0
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
## 2020-10-27 ENCOUNTER — Ambulatory Visit (INDEPENDENT_AMBULATORY_CARE_PROVIDER_SITE_OTHER): Payer: 59 | Admitting: Nurse Practitioner

## 2020-10-27 ENCOUNTER — Other Ambulatory Visit: Payer: Self-pay | Admitting: Nurse Practitioner

## 2020-10-27 ENCOUNTER — Other Ambulatory Visit: Payer: Self-pay

## 2020-10-27 ENCOUNTER — Encounter: Payer: Self-pay | Admitting: Nurse Practitioner

## 2020-10-27 VITALS — BP 132/83 | HR 88 | Temp 97.7°F | Ht 64.0 in | Wt 217.0 lb

## 2020-10-27 DIAGNOSIS — Z7689 Persons encountering health services in other specified circumstances: Secondary | ICD-10-CM

## 2020-10-27 DIAGNOSIS — F424 Excoriation (skin-picking) disorder: Secondary | ICD-10-CM

## 2020-10-27 DIAGNOSIS — Z131 Encounter for screening for diabetes mellitus: Secondary | ICD-10-CM | POA: Diagnosis not present

## 2020-10-27 DIAGNOSIS — Z13220 Encounter for screening for lipoid disorders: Secondary | ICD-10-CM

## 2020-10-27 DIAGNOSIS — Z7289 Other problems related to lifestyle: Secondary | ICD-10-CM

## 2020-10-27 DIAGNOSIS — F331 Major depressive disorder, recurrent, moderate: Secondary | ICD-10-CM

## 2020-10-27 DIAGNOSIS — F419 Anxiety disorder, unspecified: Secondary | ICD-10-CM

## 2020-10-27 DIAGNOSIS — Z130 Encounter for screening for diseases of the blood and blood-forming organs and certain disorders involving the immune mechanism: Secondary | ICD-10-CM

## 2020-10-27 LAB — POCT URINALYSIS DIPSTICK
Bilirubin, UA: NEGATIVE
Blood, UA: NEGATIVE
Clarity, UA: NEGATIVE
Glucose, UA: NEGATIVE
Ketones, UA: NEGATIVE
Nitrite, UA: NEGATIVE
Protein, UA: NEGATIVE
Spec Grav, UA: <=1.005 — AB (ref 1.010–1.025)
Urobilinogen, UA: 0.2 E.U./dL
pH, UA: 6 (ref 5.0–8.0)

## 2020-10-27 MED ORDER — CLONAZEPAM 0.5 MG PO TABS: 0.5000 mg | ORAL_TABLET | Freq: Two times a day (BID) | ORAL | 1 refills | Status: DC | PRN

## 2020-10-27 MED ORDER — CLONAZEPAM 0.5 MG PO TABS: 0.5000 mg | ORAL_TABLET | Freq: Every day | ORAL | 0 refills | Status: DC
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
## 2020-10-27 NOTE — Patient Instructions (Addendum)
Health Maintenance, Female Adopting a healthy lifestyle and getting preventive care are important in promoting health and wellness. Ask your health care provider about:  The right schedule for you to have regular tests and exams.  Things you can do on your own to prevent diseases and keep yourself healthy. What should I know about diet, weight, and exercise? Eat a healthy diet  Eat a diet that includes plenty of vegetables, fruits, low-fat dairy products, and lean protein.  Do not eat a lot of foods that are high in solid fats, added sugars, or sodium.   Maintain a healthy weight Body mass index (BMI) is used to identify weight problems. It estimates body fat based on height and weight. Your health care provider can help determine your BMI and help you achieve or maintain a healthy weight. Get regular exercise Get regular exercise. This is one of the most important things you can do for your health. Most adults should:  Exercise for at least 150 minutes each week. The exercise should increase your heart rate and make you sweat (moderate-intensity exercise).  Do strengthening exercises at least twice a week. This is in addition to the moderate-intensity exercise.  Spend less time sitting. Even light physical activity can be beneficial. Watch cholesterol and blood lipids Have your blood tested for lipids and cholesterol at 48 years of age, then have this test every 5 years. Have your cholesterol levels checked more often if:  Your lipid or cholesterol levels are high.  You are older than 48 years of age.  You are at high risk for heart disease. What should I know about cancer screening? Depending on your health history and family history, you may need to have cancer screening at various ages. This may include screening for:  Breast cancer.  Cervical cancer.  Colorectal cancer.  Skin cancer.  Lung cancer. What should I know about heart disease, diabetes, and high blood  pressure? Blood pressure and heart disease  High blood pressure causes heart disease and increases the risk of stroke. This is more likely to develop in people who have high blood pressure readings, are of African descent, or are overweight.  Have your blood pressure checked: ? Every 3-5 years if you are 38-35 years of age. ? Every year if you are 19 years old or older. Diabetes Have regular diabetes screenings. This checks your fasting blood sugar level. Have the screening done:  Once every three years after age 64 if you are at a normal weight and have a low risk for diabetes.  More often and at a younger age if you are overweight or have a high risk for diabetes. What should I know about preventing infection? Hepatitis B If you have a higher risk for hepatitis B, you should be screened for this virus. Talk with your health care provider to find out if you are at risk for hepatitis B infection. Hepatitis C Testing is recommended for:  Everyone born from [birth year] through 1963.  Anyone with known risk factors for hepatitis C. Sexually transmitted infections (STIs)  Get screened for STIs, including gonorrhea and chlamydia, if: ? You are sexually active and are younger than 48 years of age. ? You are older than 48 years of age and your health care provider tells you that you are at risk for this type of infection. ? Your sexual activity has changed since you were last screened, and you are at increased risk for chlamydia or gonorrhea. Ask your health care  provider if you are at risk.  Ask your health care provider about whether you are at high risk for HIV. Your health care provider may recommend a prescription medicine to help prevent HIV infection. If you choose to take medicine to prevent HIV, you should first get tested for HIV. You should then be tested every 3 months for as long as you are taking the medicine. Pregnancy  If you are about to stop having your period (premenopausal) and  you may become pregnant, seek counseling before you get pregnant.  Take 400 to 800 micrograms (mcg) of folic acid every day if you become pregnant.  Ask for birth control (contraception) if you want to prevent pregnancy. Osteoporosis and menopause Osteoporosis is a disease in which the bones lose minerals and strength with aging. This can result in bone fractures. If you are 77 years old or older, or if you are at risk for osteoporosis and fractures, ask your health care provider if you should:  Be screened for bone loss.  Take a calcium or vitamin D supplement to lower your risk of fractures.  Be given hormone replacement therapy (HRT) to treat symptoms of menopause. Follow these instructions at home: Lifestyle  Do not use any products that contain nicotine or tobacco, such as cigarettes, e-cigarettes, and chewing tobacco. If you need help quitting, ask your health care provider.  Do not use street drugs.  Do not share needles.  Ask your health care provider for help if you need support or information about quitting drugs. Alcohol use  Do not drink alcohol if: ? Your health care provider tells you not to drink. ? You are pregnant, may be pregnant, or are planning to become pregnant.  If you drink alcohol: ? Limit how much you use to 0-1 drink a day. ? Limit intake if you are breastfeeding.  Be aware of how much alcohol is in your drink. In the [place] one drink equals one 12 oz bottle of beer (355 mL), one 5 oz glass of wine (148 mL), or one 1 oz glass of hard liquor (44 mL). General instructions  Schedule regular health, dental, and eye exams.  Stay current with your vaccines.  Tell your health care provider if: ? You often feel depressed. ? You have ever been abused or do not feel safe at home. Summary  Adopting a healthy lifestyle and getting preventive care are important in promoting health and wellness.  Follow your health care provider's instructions about healthy  diet, exercising, and getting tested or screened for diseases.  Follow your health care provider's instructions on monitoring your cholesterol and blood pressure. This information is not intended to replace advice given to you by your health care provider. Make sure you discuss any questions you have with your health care provider. Document Revised: 07/09/2018 Document Reviewed: 07/09/2018 Elsevier Patient Education  2021 [place], Adult After being diagnosed with an anxiety disorder, you may be relieved to know why you have felt or behaved a certain way. You may also feel overwhelmed about the treatment ahead and what it will mean for your life. With care and support, you can manage this condition and recover from it. How to manage lifestyle changes Managing stress and anxiety Stress is your body's reaction to life changes and events, both good and bad. Most stress will last just a few hours, but stress can be ongoing and can lead to more than just stress. Although stress can play a major role in  anxiety, it is not the same as anxiety. Stress is usually caused by something external, such as a deadline, test, or competition. Stress normally passes after the triggering event has ended.  Anxiety is caused by something internal, such as imagining a terrible outcome or worrying that something will go wrong that will devastate you. Anxiety often does not go away even after the triggering event is over, and it can become long-term (chronic) worry. It is important to understand the differences between stress and anxiety and to manage your stress effectively so that it does not lead to an anxious response. Talk with your health care provider or a counselor to learn more about reducing anxiety and stress. He or she may suggest tension reduction techniques, such as:  Music therapy. This can include creating or listening to music that you enjoy and that inspires you.  Mindfulness-based  meditation. This involves being aware of your normal breaths while not trying to control your breathing. It can be done while sitting or walking.  Centering prayer. This involves focusing on a word, phrase, or sacred image that means something to you and brings you peace.  Deep breathing. To do this, expand your stomach and inhale slowly through your nose. Hold your breath for 3-5 seconds. Then exhale slowly, letting your stomach muscles relax.  Self-talk. This involves identifying thought patterns that lead to anxiety reactions and changing those patterns.  Muscle relaxation. This involves tensing muscles and then relaxing them. Choose a tension reduction technique that suits your lifestyle and personality. These techniques take time and practice. Set aside 5-15 minutes a day to do them. Therapists can offer counseling and training in these techniques. The training to help with anxiety may be covered by some insurance plans. Other things you can do to manage stress and anxiety include:  Keeping a stress/anxiety diary. This can help you learn what triggers your reaction and then learn ways to manage your response.  Thinking about how you react to certain situations. You may not be able to control everything, but you can control your response.  Making time for activities that help you relax and not feeling guilty about spending your time in this way.  Visual imagery and yoga can help you stay calm and relax.   Medicines Medicines can help ease symptoms. Medicines for anxiety include:  Anti-anxiety drugs.  Antidepressants. Medicines are often used as a primary treatment for anxiety disorder. Medicines will be prescribed by a health care provider. When used together, medicines, psychotherapy, and tension reduction techniques may be the most effective treatment. Relationships Relationships can play a big part in helping you recover. Try to spend more time connecting with trusted friends and  family members. Consider going to couples counseling, taking family education classes, or going to family therapy. Therapy can help you and others better understand your condition. How to recognize changes in your anxiety Everyone responds differently to treatment for anxiety. Recovery from anxiety happens when symptoms decrease and stop interfering with your daily activities at home or work. This may mean that you will start to:  Have better concentration and focus. Worry will interfere less in your daily thinking.  Sleep better.  Be less irritable.  Have more energy.  Have improved memory. It is important to recognize when your condition is getting worse. Contact your health care provider if your symptoms interfere with home or work and you feel like your condition is not improving. Follow these instructions at home: Activity  Exercise. Most adults  should do the following: ? Exercise for at least 150 minutes each week. The exercise should increase your heart rate and make you sweat (moderate-intensity exercise). ? Strengthening exercises at least twice a week.  Get the right amount and quality of sleep. Most adults need 7-9 hours of sleep each night. Lifestyle  Eat a healthy diet that includes plenty of vegetables, fruits, whole grains, low-fat dairy products, and lean protein. Do not eat a lot of foods that are high in solid fats, added sugars, or salt.  Make choices that simplify your life.  Do not use any products that contain nicotine or tobacco, such as cigarettes, e-cigarettes, and chewing tobacco. If you need help quitting, ask your health care provider.  Avoid caffeine, alcohol, and certain over-the-counter cold medicines. These may make you feel worse. Ask your pharmacist which medicines to avoid.   General instructions  Take over-the-counter and prescription medicines only as told by your health care provider.  Keep all follow-up visits as told by your health care  provider. This is important. Where to find support You can get help and support from these sources:  Self-help groups.  Online and [employer].  A trusted spiritual leader.  Couples counseling.  Family education classes.  Family therapy. Where to find more information You may find that joining a support group helps you deal with your anxiety. The following sources can help you locate counselors or support groups near you:  [place]: www.mentalhealthamerica.net  Anxiety and Depression Association of [place] (ADAA): https://www.clark.net/  National Alliance on Mental Illness (NAMI): www.nami.org Contact a health care provider if you:  Have a hard time staying focused or finishing daily tasks.  Spend many hours a day feeling worried about everyday life.  Become exhausted by worry.  Start to have headaches, feel tense, or have nausea.  Urinate more than normal.  Have diarrhea. Get help right away if you have:  A racing heart and shortness of breath.  Thoughts of hurting yourself or others. If you ever feel like you may hurt yourself or others, or have thoughts about taking your own life, get help right away. You can go to your nearest emergency department or call:  Your local emergency services (911 in the [place]  A suicide crisis helpline, such as the [facility] at (815) 071-7243. This is open 24 hours a day. Summary  Taking steps to learn and use tension reduction techniques can help calm you and help prevent triggering an anxiety reaction.  When used together, medicines, psychotherapy, and tension reduction techniques may be the most effective treatment.  Family, friends, and partners can play a big part in helping you recover from an anxiety disorder. This information is not intended to replace advice given to you by your health care provider. Make sure you discuss any questions you have with your health care  provider. Document Revised: 12/16/2018 Document Reviewed: 12/16/2018 Elsevier Patient Education  [place].

## 2020-10-28 LAB — LIPID PANEL
Chol/HDL Ratio: 4.2 ratio (ref 0.0–4.4)
Cholesterol, Total: 243 mg/dL — ABNORMAL HIGH (ref 100–199)
HDL: 58 mg/dL (ref >39)
LDL Chol Calc (NIH): 171 mg/dL — ABNORMAL HIGH (ref 0–99)
Triglycerides: 83 mg/dL (ref 0–149)
VLDL Cholesterol Cal: 14 mg/dL (ref 5–40)

## 2020-10-28 LAB — COMP. METABOLIC PANEL (12)
AST: 14 IU/L (ref 0–40)
Albumin/Globulin Ratio: 1.9 (ref 1.2–2.2)
Albumin: 4.6 g/dL (ref 3.8–4.8)
Alkaline Phosphatase: 50 IU/L (ref 44–121)
BUN/Creatinine Ratio: 13 (ref 9–23)
Creatinine, Ser: 0.95 mg/dL (ref 0.57–1.00)
Globulin, Total: 2.4 g/dL (ref 1.5–4.5)
Glucose: 85 mg/dL (ref 65–99)
Potassium: 4.3 mmol/L (ref 3.5–5.2)
Sodium: 141 mmol/L (ref 134–144)
Total Protein: 7 g/dL (ref 6.0–8.5)

## 2020-10-28 LAB — CBC WITH DIFFERENTIAL/PLATELET
Basophils Absolute: 0 10*3/uL (ref 0.0–0.2)
Basos: 0 %
EOS (ABSOLUTE): 0 10*3/uL (ref 0.0–0.4)
Eos: 0 %
Hematocrit: 39 % (ref 34.0–46.6)
Hemoglobin: 12.7 g/dL (ref 11.1–15.9)
Immature Grans (Abs): 0 10*3/uL (ref 0.0–0.1)
Immature Granulocytes: 0 %
Lymphocytes Absolute: 1.6 10*3/uL (ref 0.7–3.1)
Lymphs: 15 %
MCH: 29.5 pg (ref 26.6–33.0)
MCHC: 32.6 g/dL (ref 31.5–35.7)
MCV: 91 fL (ref 79–97)
Monocytes Absolute: 0.4 10*3/uL (ref 0.1–0.9)
Monocytes: 4 %
Neutrophils Absolute: 8.7 10*3/uL — ABNORMAL HIGH (ref 1.4–7.0)
Neutrophils: 81 %
Platelets: 279 10*3/uL (ref 150–450)
RBC: 4.31 x10E6/uL (ref 3.77–5.28)
RDW: 13.4 % (ref 11.7–15.4)
WBC: 10.8 10*3/uL (ref 3.4–10.8)

## 2020-10-28 MED FILL — clonazePAM 0.5 MG TABS: 0.5 | 15 days supply | Qty: 15 | Fill #0

## 2020-10-31 ENCOUNTER — Ambulatory Visit: Payer: 59 | Admitting: Speech Pathology

## 2020-11-01 ENCOUNTER — Ambulatory Visit: Payer: Self-pay | Admitting: Gastroenterology

## 2020-11-02 ENCOUNTER — Ambulatory Visit: Payer: Self-pay | Admitting: Nurse Practitioner

## 2020-11-07 ENCOUNTER — Other Ambulatory Visit (HOSPITAL_COMMUNITY): Payer: Self-pay

## 2020-11-09 ENCOUNTER — Ambulatory Visit (HOSPITAL_COMMUNITY): Payer: 59 | Admitting: Clinical

## 2020-11-10 ENCOUNTER — Encounter: Payer: Self-pay | Admitting: Nurse Practitioner

## 2020-11-10 ENCOUNTER — Ambulatory Visit (INDEPENDENT_AMBULATORY_CARE_PROVIDER_SITE_OTHER): Payer: 59 | Admitting: Nurse Practitioner

## 2020-11-10 ENCOUNTER — Other Ambulatory Visit (HOSPITAL_COMMUNITY): Payer: Self-pay

## 2020-11-10 ENCOUNTER — Other Ambulatory Visit: Payer: Self-pay

## 2020-11-10 VITALS — BP 128/78 | HR 75 | Temp 97.7°F | Ht 64.0 in

## 2020-11-10 DIAGNOSIS — Z7289 Other problems related to lifestyle: Secondary | ICD-10-CM

## 2020-11-10 DIAGNOSIS — F419 Anxiety disorder, unspecified: Secondary | ICD-10-CM | POA: Diagnosis not present

## 2020-11-10 DIAGNOSIS — R441 Visual hallucinations: Secondary | ICD-10-CM | POA: Diagnosis not present

## 2020-11-10 DIAGNOSIS — R440 Auditory hallucinations: Secondary | ICD-10-CM | POA: Diagnosis not present

## 2020-11-10 DIAGNOSIS — F424 Excoriation (skin-picking) disorder: Secondary | ICD-10-CM

## 2020-11-10 MED ORDER — CLONAZEPAM 0.5 MG PO TABS
0.5000 mg | ORAL_TABLET | Freq: Two times a day (BID) | ORAL | 0 refills | Status: DC
  Filled 2020-11-10: qty 60, 30d supply, fill #0

## 2020-11-10 MED ORDER — VALACYCLOVIR HCL 500 MG PO TABS
500.0000 mg | ORAL_TABLET | Freq: Every day | ORAL | 1 refills | Status: DC
  Filled 2020-11-10: qty 90, 90d supply, fill #0
  Filled 2021-02-09: qty 90, 90d supply, fill #1
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
## 2020-11-10 NOTE — Patient Instructions (Addendum)
[facility] [phone]    Managing Anxiety, Adult After being diagnosed with an anxiety disorder, you may be relieved to know why you have felt or behaved a certain way. You may also feel overwhelmed about the treatment ahead and what it will mean for your life. With care and support, you can manage this condition and recover from it. How to manage lifestyle changes Managing stress and anxiety Stress is your body's reaction to life changes and events, both good and bad. Most stress will last just a few hours, but stress can be ongoing and can lead to more than just stress. Although stress can play a major role in anxiety, it is not the same as anxiety. Stress is usually caused by something external, such as a deadline, test, or competition. Stress normally passes after the triggering event has ended.  Anxiety is caused by something internal, such as imagining a terrible outcome or worrying that something will go wrong that will devastate you. Anxiety often does not go away even after the triggering event is over, and it can become long-term (chronic) worry. It is important to understand the differences between stress and anxiety and to manage your stress effectively so that it does not lead to an anxious response. Talk with your health care provider or a counselor to learn more about reducing anxiety and stress. He or she may suggest tension reduction techniques, such as:  Music therapy. This can include creating or listening to music that you enjoy and that inspires you.  Mindfulness-based meditation. This involves being aware of your normal breaths while not trying to control your breathing. It can be done while sitting or walking.  Centering prayer. This involves focusing on a word, phrase, or sacred image that means something to you and brings you peace.  Deep breathing. To do this, expand your stomach and inhale slowly through your nose. Hold your breath for 3-5 seconds. Then  exhale slowly, letting your stomach muscles relax.  Self-talk. This involves identifying thought patterns that lead to anxiety reactions and changing those patterns.  Muscle relaxation. This involves tensing muscles and then relaxing them. Choose a tension reduction technique that suits your lifestyle and personality. These techniques take time and practice. Set aside 5-15 minutes a day to do them. Therapists can offer counseling and training in these techniques. The training to help with anxiety may be covered by some insurance plans. Other things you can do to manage stress and anxiety include:  Keeping a stress/anxiety diary. This can help you learn what triggers your reaction and then learn ways to manage your response.  Thinking about how you react to certain situations. You may not be able to control everything, but you can control your response.  Making time for activities that help you relax and not feeling guilty about spending your time in this way.  Visual imagery and yoga can help you stay calm and relax.   Medicines Medicines can help ease symptoms. Medicines for anxiety include:  Anti-anxiety drugs.  Antidepressants. Medicines are often used as a primary treatment for anxiety disorder. Medicines will be prescribed by a health care provider. When used together, medicines, psychotherapy, and tension reduction techniques may be the most effective treatment. Relationships Relationships can play a big part in helping you recover. Try to spend more time connecting with trusted friends and family members. Consider going to couples counseling, taking family education classes, or going to family therapy. Therapy can help you and others better understand your  condition. How to recognize changes in your anxiety Everyone responds differently to treatment for anxiety. Recovery from anxiety happens when symptoms decrease and stop interfering with your daily activities at home or work. This  may mean that you will start to:  Have better concentration and focus. Worry will interfere less in your daily thinking.  Sleep better.  Be less irritable.  Have more energy.  Have improved memory. It is important to recognize when your condition is getting worse. Contact your health care provider if your symptoms interfere with home or work and you feel like your condition is not improving. Follow these instructions at home: Activity  Exercise. Most adults should do the following: ? Exercise for at least 150 minutes each week. The exercise should increase your heart rate and make you sweat (moderate-intensity exercise). ? Strengthening exercises at least twice a week.  Get the right amount and quality of sleep. Most adults need 7-9 hours of sleep each night. Lifestyle  Eat a healthy diet that includes plenty of vegetables, fruits, whole grains, low-fat dairy products, and lean protein. Do not eat a lot of foods that are high in solid fats, added sugars, or salt.  Make choices that simplify your life.  Do not use any products that contain nicotine or tobacco, such as cigarettes, e-cigarettes, and chewing tobacco. If you need help quitting, ask your health care provider.  Avoid caffeine, alcohol, and certain over-the-counter cold medicines. These may make you feel worse. Ask your pharmacist which medicines to avoid.   General instructions  Take over-the-counter and prescription medicines only as told by your health care provider.  Keep all follow-up visits as told by your health care provider. This is important. Where to find support You can get help and support from these sources:  Self-help groups.  Online and [employer].  A trusted spiritual leader.  Couples counseling.  Family education classes.  Family therapy. Where to find more information You may find that joining a support group helps you deal with your anxiety. The following sources can help you  locate counselors or support groups near you:  [place]: www.mentalhealth[place].net  Anxiety and Depression Association of [place] (ADAA): https://www.clark.net/  National Alliance on Mental Illness (NAMI): www.nami.org Contact a health care provider if you:  Have a hard time staying focused or finishing daily tasks.  Spend many hours a day feeling worried about everyday life.  Become exhausted by worry.  Start to have headaches, feel tense, or have nausea.  Urinate more than normal.  Have diarrhea. Get help right away if you have:  A racing heart and shortness of breath.  Thoughts of hurting yourself or others. If you ever feel like you may hurt yourself or others, or have thoughts about taking your own life, get help right away. You can go to your nearest emergency department or call:  Your local emergency services (911 in the [place]  A suicide crisis helpline, such as the [place] at 628 758 2751. This is open 24 hours a day. Summary  Taking steps to learn and use tension reduction techniques can help calm you and help prevent triggering an anxiety reaction.  When used together, medicines, psychotherapy, and tension reduction techniques may be the most effective treatment.  Family, friends, and partners can play a big part in helping you recover from an anxiety disorder. This information is not intended to replace advice given to you by your health care provider. Make sure you discuss any questions you  have with your health care provider. Document Revised: 12/16/2018 Document Reviewed: 12/16/2018 Elsevier Patient Education  [place].

## 2020-11-11 ENCOUNTER — Other Ambulatory Visit (HOSPITAL_COMMUNITY): Payer: Self-pay

## 2020-11-14 ENCOUNTER — Other Ambulatory Visit (HOSPITAL_COMMUNITY): Payer: Self-pay

## 2020-11-14 ENCOUNTER — Other Ambulatory Visit: Payer: Self-pay | Admitting: Nurse Practitioner

## 2020-11-14 MED ORDER — AMOXICILLIN-POT CLAVULANATE 875-125 MG PO TABS
1.0000 | ORAL_TABLET | Freq: Two times a day (BID) | ORAL | 0 refills | Status: DC
  Filled 2020-11-14: qty 20, 10d supply, fill #0

## 2020-11-17 ENCOUNTER — Ambulatory Visit (HOSPITAL_COMMUNITY): Payer: 59 | Admitting: Psychiatry

## 2020-11-17 ENCOUNTER — Other Ambulatory Visit: Payer: Self-pay

## 2020-11-21 ENCOUNTER — Ambulatory Visit: Payer: Self-pay | Admitting: Gastroenterology

## 2020-11-21 ENCOUNTER — Ambulatory Visit: Payer: Self-pay

## 2020-11-23 ENCOUNTER — Other Ambulatory Visit: Payer: Self-pay

## 2020-11-23 ENCOUNTER — Ambulatory Visit (INDEPENDENT_AMBULATORY_CARE_PROVIDER_SITE_OTHER): Payer: 59 | Admitting: Clinical

## 2020-11-23 DIAGNOSIS — F419 Anxiety disorder, unspecified: Secondary | ICD-10-CM | POA: Diagnosis not present

## 2020-11-23 DIAGNOSIS — F4310 Post-traumatic stress disorder, unspecified: Secondary | ICD-10-CM | POA: Diagnosis not present

## 2020-11-23 DIAGNOSIS — F331 Major depressive disorder, recurrent, moderate: Secondary | ICD-10-CM

## 2020-11-23 DIAGNOSIS — Z7289 Other problems related to lifestyle: Secondary | ICD-10-CM

## 2020-11-23 DIAGNOSIS — R441 Visual hallucinations: Secondary | ICD-10-CM | POA: Diagnosis not present

## 2020-11-23 DIAGNOSIS — F422 Mixed obsessional thoughts and acts: Secondary | ICD-10-CM

## 2020-11-23 DIAGNOSIS — R440 Auditory hallucinations: Secondary | ICD-10-CM | POA: Diagnosis not present
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
## 2020-11-24 ENCOUNTER — Other Ambulatory Visit (HOSPITAL_COMMUNITY): Payer: Self-pay

## 2020-11-24 ENCOUNTER — Encounter (HOSPITAL_COMMUNITY): Payer: Self-pay | Admitting: Psychiatry

## 2020-11-24 ENCOUNTER — Ambulatory Visit (INDEPENDENT_AMBULATORY_CARE_PROVIDER_SITE_OTHER): Payer: 59 | Admitting: Psychiatry

## 2020-11-24 ENCOUNTER — Other Ambulatory Visit: Payer: Self-pay

## 2020-11-24 VITALS — BP 131/87 | HR 80

## 2020-11-24 DIAGNOSIS — R441 Visual hallucinations: Secondary | ICD-10-CM

## 2020-11-24 DIAGNOSIS — F331 Major depressive disorder, recurrent, moderate: Secondary | ICD-10-CM

## 2020-11-24 DIAGNOSIS — F419 Anxiety disorder, unspecified: Secondary | ICD-10-CM | POA: Diagnosis not present

## 2020-11-24 DIAGNOSIS — F4310 Post-traumatic stress disorder, unspecified: Secondary | ICD-10-CM

## 2020-11-24 MED ORDER — DULOXETINE HCL 30 MG PO CPEP
30.0000 mg | ORAL_CAPSULE | Freq: Every day | ORAL | 0 refills | Status: DC
  Filled 2020-11-24: qty 30, 30d supply, fill #0

## 2020-11-24 MED ORDER — ALPRAZOLAM 0.5 MG PO TABS
0.5000 mg | ORAL_TABLET | Freq: Two times a day (BID) | ORAL | 0 refills | Status: DC | PRN
  Filled 2020-11-24: qty 45, 23d supply, fill #0

## 2020-11-24 MED ORDER — PREGABALIN 25 MG PO CAPS
50.0000 mg | ORAL_CAPSULE | Freq: Every day | ORAL | 0 refills | Status: DC
  Filled 2020-11-24: qty 60, 30d supply, fill #0

## 2020-11-24 MED ORDER — OLANZAPINE-FLUOXETINE HCL 3-25 MG PO CAPS
1.0000 | ORAL_CAPSULE | Freq: Every day | ORAL | 0 refills | Status: DC
  Filled 2020-11-24: qty 30, 30d supply, fill #0

## 2020-11-24 MED FILL — Metoprolol Succinate Tab ER 24HR 100 MG (Tartrate Equiv): ORAL | 30 days supply | Qty: 30 | Fill #0 | Status: AC
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
## 2020-11-25 ENCOUNTER — Other Ambulatory Visit (HOSPITAL_COMMUNITY): Payer: Self-pay

## 2020-11-28 NOTE — Progress Notes (Addendum)
Cardiology Office Note   Date:  11/30/2020   ID:  Desiree Russell, DOB March 02, 1971, MRN [id number]  PCP:  Vevelyn Francois, NP  Cardiologist:   None Referring:  Vevelyn Francois, NP  Chief Complaint  Patient presents with  . Shortness of Breath           History of Present Illness: Desiree Russell is a 50 y.o. female who is referred by Vevelyn Francois, NP for evaluation of pericardial effusion.  This was noted on an echocardiogram at [facility]  The patient has a complicated history and has been at [facility] due to and [facility].   Most recently she was at [facility] I looked through [place] and was not able to find all of her records.    She was hospitalized and treated at [facility] for pericarditis.  This was in 2019.  I did see some records that included a CT scan earlier this year when she apparently presented post-COVID.  There was mention of a small pericardial effusion.  She said she had follow-up at [facility] with an echocardiogram but was unable to pull up that result for me.  She mentions that she has had a couple of echocardiograms in the past with one in 2021 but I do not have these results.  She mentions having to be shocked for ventricular tachycardia in the past apparently at [facility] but I do not see documentation of this.  She has shortness of breath.  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  She gets short of breath climbing a flight of stairs at home.  She is not describing PND or orthopnea.  She is currently not describing chest pressure, neck or arm discomfort.  She had no weight gain or edema.  She said no palpitations, presyncope or syncope.     Past Medical History:  Diagnosis Date  . Anxiety   . Dyslipidemia   . Fibromyalgia   . Headache   . Pericarditis   . Seizures ([place])   . Tachycardia   . TIA (transient ischemic attack)    Recurrent    Past Surgical History:  Procedure Laterality Date  . ABDOMINAL  HYSTERECTOMY    . CHOLECYSTECTOMY, LAPAROSCOPIC       Current Outpatient Medications  Medication Sig Dispense Refill  . albuterol (VENTOLIN HFA) 108 (90 Base) MCG/ACT inhaler INHALE 2 PUFFS INTO THE LUNGS EVERY 4 HOURS AS NEEDED FOR WHEEZING. 8.5 g 0  . ALPRAZolam (XANAX) 0.5 MG tablet Take 1 tablet (0.5 mg total) by mouth 2 (two) times daily as needed for anxiety. 45 tablet 0  . aspirin 81 MG EC tablet aspirin    . B Complex Vitamins (VITAMIN B COMPLEX PO) Take 1 capsule by mouth daily.    . Cholecalciferol (VITAMIN D3) 1000 units CAPS Take 1 tablet by mouth daily.    . Cranberry-Vitamin C-Probiotic (AZO CRANBERRY PO) Take 2 capsules by mouth daily.    . diclofenac sodium (VOLTAREN) 1 % GEL APPLY TO CHEST TWO TIMES DAILY AS NEEDED    . DULoxetine (CYMBALTA) 30 MG capsule Take 1 capsule (30 mg total) by mouth daily. 30 capsule 0  . esomeprazole (NEXIUM) 20 MG packet Take 20 mg by mouth daily before breakfast.    . ibuprofen (ADVIL,MOTRIN) 800 MG tablet Take 800 mg by mouth every 8 (eight) hours as needed.    . metoprolol succinate (TOPROL-XL) 100 MG 24 hr tablet TAKE  1 TABLET BY MOUTH ONCE A DAY 30 tablet 1  . OLANZapine-FLUoxetine (SYMBYAX) 3-25 MG capsule Take 1 capsule by mouth at bedtime. 30 capsule 0  . pregabalin (LYRICA) 25 MG capsule Take 2 capsules (50 mg total) by mouth at bedtime. 60 capsule 0  . sennosides-docusate sodium (SENOKOT-S) 8.6-50 MG tablet Take 12 tablets by mouth daily as needed for constipation.    Marland Kitchen Ubrogepant (UBRELVY) 100 MG TABS Ubrelvy 100 mg tablet  PLEASE SEE ATTACHED FOR DETAILED DIRECTIONS    . valACYclovir (VALTREX) 500 MG tablet Take 1 tablet (500 mg total) by mouth daily. 90 tablet 1  . fluconazole (DIFLUCAN) 150 MG tablet Take 1 tablet (150 mg total) by mouth once for 1 dose. 1 tablet 0  . scopolamine (TRANSDERM-SCOP) 1 MG/3DAYS Place 1 patch onto the skin every 3 (three) days.     No current facility-administered medications for this visit.     Allergies:   Morphine and related, Oxycodone, Chlorhexidine, and Codeine    Social History:  The patient  reports that she has never smoked. She has never used smokeless tobacco. She reports current alcohol use. She reports that she does not use drugs.   Family History:  The patient's family history includes COPD in her brother; Diabetes in her paternal grandmother; Diabetes type II in her brother; Healthy in her brother, brother, sister, sister, and sister; Heart attack (age of onset: 59) in her father; Hypertension in her father, maternal grandmother, and mother.    ROS:  Please see the history of present illness.   Otherwise, review of systems are positive for none.   All other systems are reviewed and negative.    PHYSICAL EXAM: VS:  BP 110/82 (BP Location: Left Arm, Patient Position: Sitting, Cuff Size: Large)   Pulse 72   Ht 5\' 4"  (1.626 m)   Wt 208 lb (94.3 kg)   BMI 35.70 kg/m  , BMI Body mass index is 35.7 kg/m. GENERAL:  Well appearing HEENT:  Pupils equal round and reactive, fundi not visualized, oral mucosa unremarkable NECK:  No jugular venous distention, waveform within normal limits, carotid upstroke brisk and symmetric, no bruits, no thyromegaly LYMPHATICS:  No cervical, inguinal adenopathy LUNGS:  Clear to auscultation bilaterally BACK:  No CVA tenderness CHEST:  Unremarkable HEART:  PMI not displaced or sustained,S1 and S2 within normal limits, no S3, no S4, no clicks, no rubs, no murmurs ABD:  Flat, positive bowel sounds normal in frequency in pitch, no bruits, no rebound, no guarding, no midline pulsatile mass, no hepatomegaly, no splenomegaly EXT:  2 plus pulses throughout, no edema, no cyanosis no clubbing SKIN:  No rashes no nodules NEURO:  Cranial nerves II through XII grossly intact, motor grossly intact throughout PSYCH:  Cognitively intact, oriented to person place and time    EKG:  EKG is ordered today. The ekg ordered today demonstrates sinus  rhythm, rate 72, axis within normal limits, intervals within normal limits, no acute ST-T wave changes.   Recent Labs: 10/27/2020: Hemoglobin 12.7; Platelets 279 11/29/2020: BNP 7.1    Lipid Panel    Component Value Date/Time   CHOL 243 (H) 10/27/2020 0934   TRIG 83 10/27/2020 0934   HDL 58 10/27/2020 0934   CHOLHDL 4.2 10/27/2020 0934   LDLCALC 171 (H) 10/27/2020 0934      Wt Readings from Last 3 Encounters:  11/29/20 208 lb (94.3 kg)  10/27/20 217 lb (98.4 kg)  03/14/18 208 lb (94.3 kg)  Other studies Reviewed: Additional studies/ records that were reviewed today include: Care Everywhere. Review of the above records demonstrates:  Please see elsewhere in the note.     ASSESSMENT AND PLAN:  PERICARDIAL EFFUSION:    I am going to start with a BNP level as she does have some dyspnea.  She is going to get me the [facility] echocardiogram from earlier this year.  Likely I will repeat this.  I do note that she had a chest x-ray done in March which showed no acute findings.  PALPITATIONS: I am going to ask her to try to get me the records from [facility] discussing her apparent ventricular tachycardia.  The details of all of her cardiac work-up are somewhat vague and I am going to start by trying to retrieve records.  DYSLIPIDEMIA: LDL was 171.  HDL 58.  I will look for the CT that she had done most recently to see if there is any evidence of coronary calcium.  Further screening will be based on this.  I might consider trying to do a treadmill though she says she had one in the past and was not able to walk very far.   Current medicines are reviewed at length with the patient today.  The patient does not have concerns regarding medicines.  The following changes have been made:  no change  Labs/ tests ordered today include:   Orders Placed This Encounter  Procedures  . B Nat Peptide  . EKG 12-Lead     Disposition:   FU with me 3 months or sooner based on the above  results.     Signed, Minus Breeding, MD  11/30/2020 4:18 PM    [facility]

## 2020-11-29 ENCOUNTER — Other Ambulatory Visit: Payer: Self-pay

## 2020-11-29 ENCOUNTER — Ambulatory Visit (INDEPENDENT_AMBULATORY_CARE_PROVIDER_SITE_OTHER): Payer: 59 | Admitting: Cardiology

## 2020-11-29 ENCOUNTER — Encounter: Payer: Self-pay | Admitting: Cardiology

## 2020-11-29 VITALS — BP 110/82 | HR 72 | Ht 64.0 in | Wt 208.0 lb

## 2020-11-29 DIAGNOSIS — I3139 Other pericardial effusion (noninflammatory): Secondary | ICD-10-CM

## 2020-11-29 DIAGNOSIS — C801 Malignant (primary) neoplasm, unspecified: Secondary | ICD-10-CM | POA: Diagnosis not present

## 2020-11-29 DIAGNOSIS — R002 Palpitations: Secondary | ICD-10-CM | POA: Diagnosis not present

## 2020-11-29 DIAGNOSIS — Z79899 Other long term (current) drug therapy: Secondary | ICD-10-CM

## 2020-11-29 DIAGNOSIS — I313 Pericardial effusion (noninflammatory): Secondary | ICD-10-CM | POA: Diagnosis not present

## 2020-11-29 NOTE — Patient Instructions (Signed)
Medication Instructions:  Continue current medications  *If you need a refill on your cardiac medications before your next appointment, please call your pharmacy*   Lab Work: BNP Today  If you have labs (blood work) drawn today and your tests are completely normal, you will receive your results only by: Marland Kitchen MyChart Message (if you have MyChart) OR . A paper copy in the mail If you have any lab test that is abnormal or we need to change your treatment, we will call you to review the results.   Testing/Procedures: None Ordered   Follow-Up: At [facility], you and your health needs are our priority.  As part of our continuing mission to provide you with exceptional heart care, we have created designated Provider Care Teams.  These Care Teams include your primary Cardiologist (physician) and Advanced Practice Providers (APPs -  Physician Assistants and Nurse Practitioners) who all work together to provide you with the care you need, when you need it.  We recommend signing up for the patient portal called "MyChart".  Sign up information is provided on this After Visit Summary.  MyChart is used to connect with patients for Virtual Visits (Telemedicine).  Patients are able to view lab/test results, encounter notes, upcoming appointments, etc.  Non-urgent messages can be sent to your provider as well.   To learn more about what you can do with MyChart, go to [email or web address].    Your next appointment:   3 month(s)  The format for your next appointment:   In Person  Provider:   You may see Minus Breeding, MD or one of the following Advanced Practice Providers on your designated Care Team:    Rosaria Ferries, PA-C  Jory Sims, DNP, ANP

## 2020-11-30 ENCOUNTER — Other Ambulatory Visit (HOSPITAL_COMMUNITY): Payer: Self-pay

## 2020-11-30 ENCOUNTER — Other Ambulatory Visit: Payer: Self-pay | Admitting: Nurse Practitioner

## 2020-11-30 LAB — BRAIN NATRIURETIC PEPTIDE: BNP: 7.1 pg/mL (ref 0.0–100.0)

## 2020-11-30 MED ORDER — FLUCONAZOLE 150 MG PO TABS
150.0000 mg | ORAL_TABLET | Freq: Once | ORAL | 0 refills | Status: AC
  Filled 2020-11-30: qty 1, 1d supply, fill #0

## 2020-12-01 ENCOUNTER — Other Ambulatory Visit (HOSPITAL_COMMUNITY): Payer: Self-pay

## 2020-12-01 ENCOUNTER — Other Ambulatory Visit: Payer: Self-pay

## 2020-12-01 ENCOUNTER — Ambulatory Visit (INDEPENDENT_AMBULATORY_CARE_PROVIDER_SITE_OTHER): Payer: 59 | Admitting: Clinical

## 2020-12-01 DIAGNOSIS — R441 Visual hallucinations: Secondary | ICD-10-CM

## 2020-12-01 DIAGNOSIS — F422 Mixed obsessional thoughts and acts: Secondary | ICD-10-CM | POA: Diagnosis not present

## 2020-12-01 DIAGNOSIS — F4310 Post-traumatic stress disorder, unspecified: Secondary | ICD-10-CM | POA: Diagnosis not present

## 2020-12-01 DIAGNOSIS — F419 Anxiety disorder, unspecified: Secondary | ICD-10-CM

## 2020-12-01 DIAGNOSIS — F331 Major depressive disorder, recurrent, moderate: Secondary | ICD-10-CM | POA: Diagnosis not present

## 2020-12-02 NOTE — Progress Notes (Signed)
   THERAPIST PROGRESS NOTE  Session Time: 4pm  Participation Level: Active  Behavioral Response: Casual and NeatAlertEuthymic  Type of Therapy: Individual Therapy  Treatment Goals addressed: Coping  Interventions: Supportive  Summary: Desiree Russell is a 50 y.o. female who presents in a euthymic mood. Pt appeared to be doing well as evidenced by normal affect, oriented x5, brighter expression. However, pt did straighten items on desk to make sure labels forward facing. Pt reports she also did this when she attended her psychiatry appt. Pt says she participated in medication mgmt appt last week and adjustments were made to her medication that she says she is responding well too. Pt continues to report ongoing passive SI and AH/VH but reports fewer hallucinations. Pt states her daughter is getting married in July and will be moving out of the home and she is having to adjust to this reality. Pt identifies daughter as her best friend.   Suicidal/Homicidal: Pt reports she experiences daily passive SI but denies a plan, intent or attempts to harm her self or others. Pt reports AH/VH but says she has had fewer hallucinations since last visit. Pt identifies her husband and daughter as protective factors. Therapist Response: CSW assessed for changes in mood, behavior and daily functioning. CSW assisted pt in completion of tx plan.  CSW actively listened as pt discussed fear of being in pubic places alone. CSW processed with pt ways in which to make her feel more comfortable and safe in her home and in public spaces.  Plan: Return again in 1 weeks.  Diagnosis: Axis I: PTSD    Moderate episode major depressive disorder    Anxiety    Visual hallucinations    Mixed obsessional thoughts and acts    Axis II: No diagnosis    Yvette Rack, LCSW 12/02/2020

## 2020-12-06 ENCOUNTER — Other Ambulatory Visit (HOSPITAL_COMMUNITY): Payer: Self-pay

## 2020-12-06 ENCOUNTER — Other Ambulatory Visit: Payer: Self-pay

## 2020-12-06 ENCOUNTER — Ambulatory Visit (INDEPENDENT_AMBULATORY_CARE_PROVIDER_SITE_OTHER): Payer: 59 | Admitting: Clinical

## 2020-12-06 ENCOUNTER — Telehealth (HOSPITAL_COMMUNITY): Payer: Self-pay | Admitting: *Deleted

## 2020-12-06 DIAGNOSIS — F422 Mixed obsessional thoughts and acts: Secondary | ICD-10-CM

## 2020-12-06 DIAGNOSIS — R441 Visual hallucinations: Secondary | ICD-10-CM

## 2020-12-06 DIAGNOSIS — F331 Major depressive disorder, recurrent, moderate: Secondary | ICD-10-CM | POA: Diagnosis not present

## 2020-12-06 DIAGNOSIS — F4310 Post-traumatic stress disorder, unspecified: Secondary | ICD-10-CM

## 2020-12-06 DIAGNOSIS — R440 Auditory hallucinations: Secondary | ICD-10-CM | POA: Diagnosis not present

## 2020-12-06 DIAGNOSIS — F419 Anxiety disorder, unspecified: Secondary | ICD-10-CM

## 2020-12-06 NOTE — Telephone Encounter (Signed)
Pt in office to see therapist and asked to speak with this nurse after visit. Pt stated that she has been having "swelling and blotchiness" in her lower legs. Pt also c/o vision being "fuzzy" as well since starting the Lyrica. Some edema noted in ankles and fingers appeared puffy as well. No pitting noted.  Please review and advise.
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
## 2020-12-07 NOTE — Telephone Encounter (Signed)
She is on Lyrica for more than a month for fibromyalgia.  She can stop Lyrica to see if symptoms subsided.  If symptoms subsided after stopping the Lyrica then she need to contact PCP to try a different medication for fibromyalgia.

## 2020-12-07 NOTE — Telephone Encounter (Signed)
Placed call to patient.  Patinet to stop Lyrica and consult Primary Care MD.

## 2020-12-14 ENCOUNTER — Other Ambulatory Visit (HOSPITAL_COMMUNITY): Payer: Self-pay

## 2020-12-15 ENCOUNTER — Ambulatory Visit (INDEPENDENT_AMBULATORY_CARE_PROVIDER_SITE_OTHER): Payer: 59 | Admitting: Nurse Practitioner

## 2020-12-15 ENCOUNTER — Other Ambulatory Visit (HOSPITAL_COMMUNITY): Payer: Self-pay

## 2020-12-15 ENCOUNTER — Other Ambulatory Visit: Payer: Self-pay

## 2020-12-15 ENCOUNTER — Encounter: Payer: Self-pay | Admitting: Nurse Practitioner

## 2020-12-15 VITALS — BP 122/72 | HR 77 | Temp 98.1°F

## 2020-12-15 DIAGNOSIS — M797 Fibromyalgia: Secondary | ICD-10-CM

## 2020-12-15 DIAGNOSIS — F4310 Post-traumatic stress disorder, unspecified: Secondary | ICD-10-CM | POA: Diagnosis not present

## 2020-12-15 DIAGNOSIS — F424 Excoriation (skin-picking) disorder: Secondary | ICD-10-CM | POA: Diagnosis not present

## 2020-12-15 DIAGNOSIS — G43909 Migraine, unspecified, not intractable, without status migrainosus: Secondary | ICD-10-CM | POA: Diagnosis not present

## 2020-12-15 DIAGNOSIS — F410 Panic disorder [episodic paroxysmal anxiety] without agoraphobia: Secondary | ICD-10-CM | POA: Diagnosis not present

## 2020-12-15 DIAGNOSIS — F323 Major depressive disorder, single episode, severe with psychotic features: Secondary | ICD-10-CM | POA: Diagnosis not present

## 2020-12-15 DIAGNOSIS — F419 Anxiety disorder, unspecified: Secondary | ICD-10-CM | POA: Diagnosis not present

## 2020-12-15 MED ORDER — IBUPROFEN-FAMOTIDINE 800-26.6 MG PO TABS: 1.0000 | ORAL_TABLET | Freq: Three times a day (TID) | ORAL | 3 refills | Status: DC

## 2020-12-15 MED ORDER — IBUPROFEN-FAMOTIDINE 800-26.6 MG PO TABS
1.0000 | ORAL_TABLET | Freq: Three times a day (TID) | ORAL | 3 refills | Status: DC
  Filled 2020-12-15: qty 90, fill #0

## 2020-12-15 MED ORDER — EMGALITY 120 MG/ML ~~LOC~~ SOSY
120.0000 mg | PREFILLED_SYRINGE | Freq: Once | SUBCUTANEOUS | 11 refills | Status: DC
  Filled 2020-12-15: qty 1, 30d supply, fill #0
  Filled 2021-02-03: qty 1, 30d supply, fill #1

## 2020-12-15 MED ORDER — GABAPENTIN 100 MG PO CAPS
100.0000 mg | ORAL_CAPSULE | Freq: Every day | ORAL | 3 refills | Status: DC
  Filled 2020-12-15: qty 90, 90d supply, fill #0

## 2020-12-15 NOTE — Patient Instructions (Signed)
Managing Anxiety, Adult After being diagnosed with an anxiety disorder, you may be relieved to know why you have felt or behaved a certain way. You may also feel overwhelmed about the treatment ahead and what it will mean for your life. With care and support, you can manage this condition and recover from it. How to manage lifestyle changes Managing stress and anxiety Stress is your body's reaction to life changes and events, both good and bad. Most stress will last just a few hours, but stress can be ongoing and can lead to more than just stress. Although stress can play a major role in anxiety, it is not the same as anxiety. Stress is usually caused by something external, such as a deadline, test, or competition. Stress normally passes after the triggering event has ended.  Anxiety is caused by something internal, such as imagining a terrible outcome or worrying that something will go wrong that will devastate you. Anxiety often does not go away even after the triggering event is over, and it can become long-term (chronic) worry. It is important to understand the differences between stress and anxiety and to manage your stress effectively so that it does not lead to an anxious response. Talk with your health care provider or a counselor to learn more about reducing anxiety and stress. He or she may suggest tension reduction techniques, such as:  Music therapy. This can include creating or listening to music that you enjoy and that inspires you.  Mindfulness-based meditation. This involves being aware of your normal breaths while not trying to control your breathing. It can be done while sitting or walking.  Centering prayer. This involves focusing on a word, phrase, or sacred image that means something to you and brings you peace.  Deep breathing. To do this, expand your stomach and inhale slowly through your nose. Hold your breath for 3-5 seconds. Then exhale slowly, letting your stomach muscles  relax.  Self-talk. This involves identifying thought patterns that lead to anxiety reactions and changing those patterns.  Muscle relaxation. This involves tensing muscles and then relaxing them. Choose a tension reduction technique that suits your lifestyle and personality. These techniques take time and practice. Set aside 5-15 minutes a day to do them. Therapists can offer counseling and training in these techniques. The training to help with anxiety may be covered by some insurance plans. Other things you can do to manage stress and anxiety include:  Keeping a stress/anxiety diary. This can help you learn what triggers your reaction and then learn ways to manage your response.  Thinking about how you react to certain situations. You may not be able to control everything, but you can control your response.  Making time for activities that help you relax and not feeling guilty about spending your time in this way.  Visual imagery and yoga can help you stay calm and relax.   Medicines Medicines can help ease symptoms. Medicines for anxiety include:  Anti-anxiety drugs.  Antidepressants. Medicines are often used as a primary treatment for anxiety disorder. Medicines will be prescribed by a health care provider. When used together, medicines, psychotherapy, and tension reduction techniques may be the most effective treatment. Relationships Relationships can play a big part in helping you recover. Try to spend more time connecting with trusted friends and family members. Consider going to couples counseling, taking family education classes, or going to family therapy. Therapy can help you and others better understand your condition. How to recognize changes in your   anxiety Everyone responds differently to treatment for anxiety. Recovery from anxiety happens when symptoms decrease and stop interfering with your daily activities at home or work. This may mean that you will start to:  Have  better concentration and focus. Worry will interfere less in your daily thinking.  Sleep better.  Be less irritable.  Have more energy.  Have improved memory. It is important to recognize when your condition is getting worse. Contact your health care provider if your symptoms interfere with home or work and you feel like your condition is not improving. Follow these instructions at home: Activity  Exercise. Most adults should do the following: ? Exercise for at least 150 minutes each week. The exercise should increase your heart rate and make you sweat (moderate-intensity exercise). ? Strengthening exercises at least twice a week.  Get the right amount and quality of sleep. Most adults need 7-9 hours of sleep each night. Lifestyle  Eat a healthy diet that includes plenty of vegetables, fruits, whole grains, low-fat dairy products, and lean protein. Do not eat a lot of foods that are high in solid fats, added sugars, or salt.  Make choices that simplify your life.  Do not use any products that contain nicotine or tobacco, such as cigarettes, e-cigarettes, and chewing tobacco. If you need help quitting, ask your health care provider.  Avoid caffeine, alcohol, and certain over-the-counter cold medicines. These may make you feel worse. Ask your pharmacist which medicines to avoid.   General instructions  Take over-the-counter and prescription medicines only as told by your health care provider.  Keep all follow-up visits as told by your health care provider. This is important. Where to find support You can get help and support from these sources:  Self-help groups.  Online and community organizations.  A trusted spiritual leader.  Couples counseling.  Family education classes.  Family therapy. Where to find more information You may find that joining a support group helps you deal with your anxiety. The following sources can help you locate counselors or support groups near  you:  Mental Health America: www.mentalhealthamerica.net  Anxiety and Depression Association of America (ADAA): www.adaa.org  National Alliance on Mental Illness (NAMI): www.nami.org Contact a health care provider if you:  Have a hard time staying focused or finishing daily tasks.  Spend many hours a day feeling worried about everyday life.  Become exhausted by worry.  Start to have headaches, feel tense, or have nausea.  Urinate more than normal.  Have diarrhea. Get help right away if you have:  A racing heart and shortness of breath.  Thoughts of hurting yourself or others. If you ever feel like you may hurt yourself or others, or have thoughts about taking your own life, get help right away. You can go to your nearest emergency department or call:  Your local emergency services (911 in the [place]  A suicide crisis helpline, such as the National Suicide Prevention Lifeline at 1-800-273-8255. This is open 24 hours a day. Summary  Taking steps to learn and use tension reduction techniques can help calm you and help prevent triggering an anxiety reaction.  When used together, medicines, psychotherapy, and tension reduction techniques may be the most effective treatment.  Family, friends, and partners can play a big part in helping you recover from an anxiety disorder. This information is not intended to replace advice given to you by your health care provider. Make sure you discuss any questions you have with your health care provider. Document   Revised: 12/16/2018 Document Reviewed: 12/16/2018 Elsevier Patient Education  [place] Post-Traumatic Stress Disorder If you have been diagnosed with post-traumatic stress disorder (PTSD), you may be relieved that you now know why you have felt or behaved a certain way. Still, you may feel overwhelmed about the treatment ahead. You may also wonder how to get the support you need and how to deal with the condition  day-to-day. If you are living with PTSD, there are ways to help you recover from it and manage your symptoms. How to manage lifestyle changes Managing stress Stress is your body's reaction to life changes and events, both good and bad. Stress can make PTSD worse. Take the following steps to manage stress:  Talk with your health care provider or a counselor if you would like to learn more about techniques to reduce your stress. He or she may suggest some stress reduction techniques such as: ? Muscle relaxation exercises. ? Regular exercise. ? Meditation, yoga, or other mind-body exercises. ? Breathing exercises. ? Listening to quiet music. ? Spending time outside.  Maintain a healthy lifestyle. Eat a healthy diet, exercise regularly, get plenty of sleep, and take time to relax.  Spend time with others. Talk with them about how you are feeling and what kind of support you need. Try not to isolate yourself, even though you may feel like doing that. Isolating yourself can delay your recovery.  Do activities and hobbies that you enjoy.  Pace yourself when doing stressful things. Take breaks, and reward yourself when you finish. Make sure that you do not overload your schedule.   Medicines Your health care provider may suggest certain medicines if he or she feels that they will help to improve your condition. Medicines for depression (antidepressants) or severe loss of contact with reality (antipsychotics) may be used to treat PTSD. Avoid using alcohol and other substances that may prevent your medicines from working properly. It is also important to:  Talk with your pharmacist or health care provider about all medicines that you take, their possible side effects, and which medicines are safe to take together.  Make it your goal to take part in all treatment decisions (shared decision-making). Ask about possible side effects of medicines that your health care provider recommends, and tell him or  her how you feel about having those side effects. It is best if shared decision-making with your health care provider is part of your total treatment plan. If your health care provider prescribes a medicine, you may not notice the full benefits of it for 4-8 weeks. Most people who are treated for PTSD need to take medicine for at least 6-12 months before they feel better. If you are taking medicines as part of your treatment, do not stop taking medicines before you ask your health care provider if it is safe to stop. You may need to have the medicine slowly decreased (tapered) over time to lower the risk of harmful side effects. Relationships Many people who have PTSD have difficulty trusting others. Make an effort to:  Take risks and develop trust with close friends and family members. Developing trust in others can help you feel safe and connect you with emotional support.  Be open and honest about your feelings.  Have fun and relax in safe spaces, such as with friends and family.  Think about going to couples counseling, family education classes, or family therapy. Your loved ones may not always know how to be supportive. Therapy can be  helpful for everyone. How to recognize changes in your condition Be aware of your symptoms and how often you have them. The following symptoms mean that you need to seek help for your PTSD:  You feel suspicious and angry.  You have repeated flashbacks.  You avoid going out or being with others.  You have an increasing number of fights with close friends or family members, such as your spouse.  You have thoughts about hurting yourself or others.  You cannot get relief from feelings of depression or anxiety. Follow these instructions at home: Lifestyle  Exercise regularly. Try to do 30 or more minutes of physical activity on most days of the week.  Try to get 7-9 hours of sleep each night. To help with sleep: ? Keep your bedroom cool and  dark. ? Avoid screen time before bedtime. This means avoiding use of your TV, computer, tablet, and cell phone.  Practice self-soothing skills and use them daily.  Try to have fun and seek humor in your life. Eating and drinking  Do not eat a heavy meal during the hour before you go to bed.  Do not drink alcohol or caffeinated drinks before bed.  Avoid using alcohol or drugs. General instructions  If your PTSD is affecting your marriage or family, seek help from a family therapist.  Remind yourself that recovering from the trauma is a process and takes time.  Take over-the-counter and prescription medicines only as told by your health care provider.  Make sure to let all of your health care providers know that you have PTSD. This is especially important if you are having surgery or need to be admitted to the hospital.  Keep all follow-up visits as told by your health care providers. This is important. Where to find support Talking to others  Explain that PTSD is a mental health problem. It is something that a person can develop after experiencing or seeing a life-threatening event. Tell them that PTSD makes you feel stress like you did during the event.  Talk to your loved ones about the symptoms you have. Also tell them what things or situations can cause symptoms to start (are triggers for you).  Assure your loved ones that there are treatments to help PTSD. Discuss possibly seeking family therapy or couples therapy.  If you are worried or fearful about seeking treatment, ask for support.  Keep daily contact with at least one trusted friend or family member. Finances Not all insurance plans cover mental health care, so it is important to check with your insurance carrier. If paying for co-pays or counseling services is a problem, search for a local or county mental health care center. Public mental health care services may be offered there at a low cost or no cost when you are  not able to see a private health care provider. If you are a veteran, contact a local veterans organization or veterans hospital for more information. If you are taking medicine for PTSD, you may be able to get the genericform, which may be less expensive than brand-name medicine. Some makers of prescription medicines also offer help to patients who cannot afford the medicines that they need. Therapy and support groups  Find a support group in your community. Often, groups are available for [employer] veterans, trauma victims, and family members or caregivers.  Look into volunteer opportunities. Taking part in these can help you feel more connected to your community.  Contact a local organization to find out if  you are eligible for a service dog. Where to find more information Go to this website to find more information about PTSD, treatment of PTSD, and how to get support:  [facility] for PTSD: www.ptsd.[employer].cz Contact a health care provider if:  Your symptoms get worse or do not get better. Get help right away if:  You have thoughts about hurting yourself or others. If you ever feel like you may hurt yourself or others, or have thoughts about taking your own life, get help right away. You can go to your nearest emergency department or call:  Your local emergency services (911 in the [place]  A suicide crisis helpline, such as the [place] at 534-363-8549. This is open 24-hours a day. Summary  If you are living with PTSD, there are ways to help you recover from it and manage your symptoms.  Find supportive environments and people who understand PTSD. Spend time in those places, and maintain contact with those people.  Work with your health care team to create a plan for managing PTSD. The plan should include counseling, stress reduction techniques, and healthy lifestyle habits. This information is not intended to replace advice given to you by your  health care provider. Make sure you discuss any questions you have with your health care provider. Document Revised: 04/01/2020 Document Reviewed: 04/01/2020 Elsevier Patient Education  2021 [employer].

## 2020-12-15 NOTE — Progress Notes (Signed)
[place], [place]  [address] Phone:  [phone]   Fax:  [phone]   Established Patient Office Visit  Subjective:  Patient ID: Desiree Russell, female    DOB: 03/09/1971  Age: 50 y.o. MRN: [id number]  CC:  Chief Complaint  Patient presents with  . Follow-up    4 week follow up on anxiety. Questions about medication.     HPI Desiree Russell presents for follow up. She  has a past medical history of Anxiety, Dyslipidemia, Fibromyalgia, Headache, Pericarditis, Seizures ([place]), Tachycardia, and TIA (transient ischemic attack).   She reports following up with psychiatry and her clonazepam being changes to alprazolam. She is compliant with her medication. She is weaning off the Cymbalta and feels like she needs something additional for her fibromyalgia.  She has failed Lyrica in the past.  She will ports been on gabapentin however this was discontinued due to insurance.  She is willing to retry.  She has made some lifestyle changes due to her cholesterol.  She is walking several miles a day and eating a healthy diet.  She has had a weight loss of approximately 10 pounds  Past Medical History:  Diagnosis Date  . Anxiety   . Dyslipidemia   . Fibromyalgia   . Headache   . Pericarditis   . Seizures ([place])   . Tachycardia   . TIA (transient ischemic attack)    Recurrent    Past Surgical History:  Procedure Laterality Date  . ABDOMINAL HYSTERECTOMY    . CHOLECYSTECTOMY, LAPAROSCOPIC      Family History  Problem Relation Age of Onset  . Hypertension Mother   . Hypertension Father   . Heart attack Father 22  . Healthy Sister   . Diabetes type II Brother   . Hypertension Maternal Grandmother   . Diabetes Paternal Grandmother   . Healthy Sister   . Healthy Sister   . COPD Brother   . Healthy Brother   . Healthy Brother     Social History   Socioeconomic History  . Marital status: Married    Spouse name: Not on file  . Number  of children: Not on file  . Years of education: Not on file  . Highest education level: Not on file  Occupational History  . Not on file  Tobacco Use  . Smoking status: Never Smoker  . Smokeless tobacco: Never Used  Substance and Sexual Activity  . Alcohol use: Yes    Comment: occ   . Drug use: No  . Sexual activity: Not on file  Other Topics Concern  . Not on file  Social History Narrative   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     Social Determinants of Health   Financial Resource Strain: Not on file  Food Insecurity: Not on file  Transportation Needs: Not on file  Physical Activity: Not on file  Stress: Not on file  Social Connections: Not on file  Intimate Partner Violence: Not on file    Outpatient Medications Prior to Visit  Medication Sig Dispense Refill  . albuterol (VENTOLIN HFA) 108 (90 Base) MCG/ACT inhaler INHALE 2 PUFFS INTO THE LUNGS EVERY 4 HOURS AS NEEDED FOR WHEEZING. 8.5 g 0  . ALPRAZolam (XANAX) 0.5 MG tablet Take 1 tablet (0.5 mg total) by mouth 2 (two) times daily as needed for anxiety. 45 tablet 0  . aspirin 81 MG EC tablet aspirin    .  B Complex Vitamins (VITAMIN B COMPLEX PO) Take 1 capsule by mouth daily.    . Cholecalciferol (VITAMIN D3) 1000 units CAPS Take 1 tablet by mouth daily.    . Cranberry-Vitamin C-Probiotic (AZO CRANBERRY PO) Take 2 capsules by mouth daily.    . diclofenac sodium (VOLTAREN) 1 % GEL APPLY TO CHEST TWO TIMES DAILY AS NEEDED    . DULoxetine (CYMBALTA) 30 MG capsule Take 1 capsule (30 mg total) by mouth daily. 30 capsule 0  . esomeprazole (NEXIUM) 20 MG packet Take 20 mg by mouth daily before breakfast.    . ibuprofen (ADVIL,MOTRIN) 800 MG tablet Take 800 mg by mouth every 8 (eight) hours as needed.    . metoprolol succinate (TOPROL-XL) 100 MG 24 hr tablet TAKE 1 TABLET BY MOUTH ONCE A DAY 30 tablet 1  . OLANZapine-FLUoxetine (SYMBYAX) 3-25 MG capsule Take 1 capsule by mouth at bedtime. 30 capsule 0  .  sennosides-docusate sodium (SENOKOT-S) 8.6-50 MG tablet Take 12 tablets by mouth daily as needed for constipation.    Marland Kitchen Ubrogepant (UBRELVY) 100 MG TABS Ubrelvy 100 mg tablet  PLEASE SEE ATTACHED FOR DETAILED DIRECTIONS    . valACYclovir (VALTREX) 500 MG tablet Take 1 tablet (500 mg total) by mouth daily. 90 tablet 1  . Galcanezumab-gnlm (EMGALITY) 120 MG/ML SOSY Inject 120 mg into the skin. Injection once a month.    . pregabalin (LYRICA) 25 MG capsule Take 2 capsules (50 mg total) by mouth at bedtime. (Patient not taking: Reported on 12/15/2020) 60 capsule 0  . scopolamine (TRANSDERM-SCOP) 1 MG/3DAYS Place 1 patch onto the skin every 3 (three) days. (Patient not taking: Reported on 12/15/2020)     No facility-administered medications prior to visit.    Allergies  Allergen Reactions  . Morphine And Related Hives  . Oxycodone Hives  . Chlorhexidine Hives    rash rash rash rash   . Codeine     Hives     ROS Review of Systems    Objective:    Physical Exam Constitutional:      General: She is not in acute distress.    Appearance: She is obese. She is not toxic-appearing.  HENT:     Head: Normocephalic and atraumatic.     Nose: Nose normal.     Mouth/Throat:     Mouth: Mucous membranes are moist.  Cardiovascular:     Rate and Rhythm: Normal rate.     Pulses: Normal pulses.     Heart sounds: No friction rub.  Pulmonary:     Effort: Pulmonary effort is normal.  Musculoskeletal:        General: Normal range of motion.     Cervical back: Normal range of motion.  Skin:    General: Skin is warm and dry.     Capillary Refill: Capillary refill takes less than 2 seconds.  Neurological:     General: No focal deficit present.     Mental Status: She is alert and oriented to person, place, and time.  Psychiatric:        Mood and Affect: Mood normal.        Behavior: Behavior normal.        Thought Content: Thought content normal.     BP 122/72 (BP Location: Left Arm,  Patient Position: Sitting, Cuff Size: Small)   Pulse 77   Temp 98.1 F (36.7 C)   SpO2 100%  Wt Readings from Last 3 Encounters:  11/29/20 208 lb (94.3 kg)  10/27/20  217 lb (98.4 kg)  03/14/18 208 lb (94.3 kg)     Health Maintenance Due  Topic Date Due  . COVID-19 Vaccine (1) Never done  . HIV Screening  Never done  . Hepatitis C Screening  Never done  . TETANUS/TDAP  Never done  . PAP SMEAR-Modifier  Never done  . COLONOSCOPY (Pts 45-55yrs Insurance coverage will need to be confirmed)  Never done    There are no preventive care reminders to display for this patient.  No results found for: TSH Lab Results  Component Value Date   WBC 10.8 10/27/2020   HGB 12.7 10/27/2020   HCT 39.0 10/27/2020   MCV 91 10/27/2020   PLT 279 10/27/2020   Lab Results  Component Value Date   NA 137 12/12/2014   K 4.0 12/12/2014   CO2 24 12/12/2014   GLUCOSE 90 12/12/2014   BUN 10 12/12/2014   CREATININE 1.02 (H) 12/12/2014   CALCIUM 8.8 (L) 12/12/2014   ANIONGAP 6 12/12/2014   Lab Results  Component Value Date   CHOL 243 (H) 10/27/2020   Lab Results  Component Value Date   HDL 58 10/27/2020   Lab Results  Component Value Date   LDLCALC 171 (H) 10/27/2020   Lab Results  Component Value Date   TRIG 83 10/27/2020   Lab Results  Component Value Date   CHOLHDL 4.2 10/27/2020   No results found for: HGBA1C    Assessment & Plan:   Problem List Items Addressed This Visit      Other   Compulsive skin picking  improving   Anxiety - Primary Persistent however stable    Other Visit Diagnoses    PTSD (post-traumatic stress disorder)     Persistent however stable continue to follow-up with psychiatry as scheduled   Fibromyalgia     Persistent seeking additional treatments restart gabapentin 100 mg nightly   Relevant Medications   Ibuprofen-Famotidine (DUEXIS) 800-26.6 MG TABS   gabapentin (NEURONTIN) 100 MG capsule   Migraine without status migrainosus, not  intractable, unspecified migraine type     Persistent however stable Will refill medication   Relevant Medications   Ibuprofen-Famotidine (DUEXIS) 800-26.6 MG TABS   gabapentin (NEURONTIN) 100 MG capsule      Meds ordered this encounter  Medications  . DISCONTD: Ibuprofen-Famotidine (DUEXIS) 800-26.6 MG TABS    Sig: Take 1 tablet by mouth in the morning, at noon, and at bedtime.    Dispense:  90 tablet    Refill:  3    Order Specific Question:   Supervising Provider    Answer:   Tresa Garter [id number]  . Galcanezumab-gnlm (EMGALITY) 120 MG/ML SOSY    Sig: Inject 120 mg into the skin once a month as directed.    Dispense:  1 mL    Refill:  11    Order Specific Question:   Supervising Provider    Answer:   Tresa Garter [id number]  . Ibuprofen-Famotidine (DUEXIS) 800-26.6 MG TABS    Sig: Take 1 tablet by mouth in the morning, at noon, and at bedtime.    Dispense:  90 tablet    Refill:  3    Order Specific Question:   Supervising Provider    Answer:   Tresa Garter [id number]  . gabapentin (NEURONTIN) 100 MG capsule    Sig: Take 1 capsule by mouth at bedtime.    Dispense:  90 capsule    Refill:  3    Order Specific Question:  Supervising Provider    Answer:   Tresa Garter [[id number]]    Follow-up: Return in about 2 months (around 02/14/2021) for [place] [[id number]].    Vevelyn Francois, NP

## 2020-12-20 ENCOUNTER — Ambulatory Visit (INDEPENDENT_AMBULATORY_CARE_PROVIDER_SITE_OTHER): Payer: 59 | Admitting: Clinical

## 2020-12-20 ENCOUNTER — Other Ambulatory Visit: Payer: Self-pay

## 2020-12-20 DIAGNOSIS — F422 Mixed obsessional thoughts and acts: Secondary | ICD-10-CM | POA: Diagnosis not present

## 2020-12-20 DIAGNOSIS — F4310 Post-traumatic stress disorder, unspecified: Secondary | ICD-10-CM | POA: Diagnosis not present

## 2020-12-20 DIAGNOSIS — F419 Anxiety disorder, unspecified: Secondary | ICD-10-CM

## 2020-12-20 DIAGNOSIS — R441 Visual hallucinations: Secondary | ICD-10-CM | POA: Diagnosis not present

## 2020-12-20 DIAGNOSIS — F331 Major depressive disorder, recurrent, moderate: Secondary | ICD-10-CM | POA: Diagnosis not present

## 2020-12-20 NOTE — Progress Notes (Signed)
   THERAPIST PROGRESS NOTE  Session Time: 8am  Participation Level: Active  Behavioral Response: Casual and NeatAlertpleasant  Type of Therapy: Individual Therapy  Treatment Goals addressed: Anxiety  Interventions: DBT  Summary: Desiree Russell is a 50 y.o. female who presents in a pleasant mood but becomes tearful as she discussed feeling anxious about the idea of running out of her medication. Pt says she is scheduled to meet with Dr. Adele Schilder on 5/26 and knows she will receive refill during this time but states feeling anxious about not having enough medication. Pt able to eventually challenge this thinking and calm down. Pt appeared to be proud of herself as she discussed going to a discount store by herself and working prn this past Saturday. Pt says her anxiety has made it difficult to function independently but she has had days more recently where she has been able to do so. Pt continues to report obsessional thoughts and acts. At work pt says the office has to be organized and labels front facing or it will cause intrusive thoughts and make it difficult for her to perform her job. Pt states if she is unable to complete a task "it makes me nervous" and she will organize or clean something else until the thought leaves. Pt rearranged items on writer desk and table, turning all labels front facing and straightening items. Pt states she did not organize items in lobby because it had already been completed.  Suicidal/Homicidal: Pt reports she experiences daily passive SI but denies a plan, intent or attempts to harm her self or others. Pt reports AH/VH but says she has had fewer hallucinations since last visit. Pt states she has had one visual hallucination since last visit and states "that man hasnt been bothering me at work" Pt identifies her husband and daughter as protective factors.  Therapist Response: CSW assessed for changes in mood, behavior and daily functioning. CSW reviewed and provided  pt with information on grounding techniques to assist with managing anxiety producing situations. CSW discussed with pt counting method as intervention to manage anxiety. Pt encouraged to practice skill and discuss during next session.  Plan: Return again in 2 weeks.  Diagnosis: Axis I: PTSD Moderate episode major depressive disorder Anxiety Visual hallucinations Mixed obsessional thoughts and acts    Axis II: No diagnosis    Yvette Rack, LCSW 12/20/2020

## 2020-12-22 ENCOUNTER — Other Ambulatory Visit (HOSPITAL_COMMUNITY): Payer: Self-pay

## 2020-12-22 ENCOUNTER — Ambulatory Visit (HOSPITAL_COMMUNITY): Payer: 59 | Admitting: Psychiatry

## 2020-12-22 ENCOUNTER — Ambulatory Visit (INDEPENDENT_AMBULATORY_CARE_PROVIDER_SITE_OTHER): Payer: 59 | Admitting: Psychiatry

## 2020-12-22 ENCOUNTER — Encounter (HOSPITAL_COMMUNITY): Payer: Self-pay | Admitting: Psychiatry

## 2020-12-22 ENCOUNTER — Other Ambulatory Visit: Payer: Self-pay

## 2020-12-22 VITALS — BP 127/97 | HR 66 | Resp 18 | Ht 64.0 in | Wt 206.6 lb

## 2020-12-22 DIAGNOSIS — R441 Visual hallucinations: Secondary | ICD-10-CM

## 2020-12-22 DIAGNOSIS — F331 Major depressive disorder, recurrent, moderate: Secondary | ICD-10-CM

## 2020-12-22 DIAGNOSIS — F4310 Post-traumatic stress disorder, unspecified: Secondary | ICD-10-CM | POA: Diagnosis not present

## 2020-12-22 DIAGNOSIS — F419 Anxiety disorder, unspecified: Secondary | ICD-10-CM

## 2020-12-22 MED ORDER — DULOXETINE HCL 30 MG PO CPEP
30.0000 mg | ORAL_CAPSULE | Freq: Every day | ORAL | 1 refills | Status: DC
  Filled 2020-12-22: qty 30, 30d supply, fill #0

## 2020-12-22 MED ORDER — ALPRAZOLAM 0.5 MG PO TABS
0.5000 mg | ORAL_TABLET | Freq: Two times a day (BID) | ORAL | 1 refills | Status: DC | PRN
Start: 2020-12-22 — End: 2021-02-14
  Filled 2020-12-22: qty 45, 22d supply, fill #0
  Filled 2021-01-20: qty 45, 22d supply, fill #1

## 2020-12-22 MED ORDER — OLANZAPINE-FLUOXETINE HCL 6-25 MG PO CAPS
1.0000 | ORAL_CAPSULE | Freq: Every day | ORAL | 1 refills | Status: DC
  Filled 2020-12-22: qty 30, 30d supply, fill #0
  Filled 2021-01-20: qty 30, 30d supply, fill #1
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
## 2020-12-23 ENCOUNTER — Other Ambulatory Visit (HOSPITAL_COMMUNITY): Payer: Self-pay

## 2020-12-24 ENCOUNTER — Other Ambulatory Visit (HOSPITAL_COMMUNITY): Payer: Self-pay

## 2020-12-27 ENCOUNTER — Other Ambulatory Visit (HOSPITAL_COMMUNITY): Payer: Self-pay

## 2020-12-28 DIAGNOSIS — I639 Cerebral infarction, unspecified: Secondary | ICD-10-CM

## 2020-12-28 HISTORY — DX: Cerebral infarction, unspecified: I63.9

## 2021-01-02 ENCOUNTER — Other Ambulatory Visit (HOSPITAL_COMMUNITY): Payer: Self-pay

## 2021-01-02 MED FILL — Metoprolol Succinate Tab ER 24HR 100 MG (Tartrate Equiv): ORAL | 30 days supply | Qty: 30 | Fill #1 | Status: AC

## 2021-01-05 ENCOUNTER — Other Ambulatory Visit (HOSPITAL_COMMUNITY): Payer: Self-pay

## 2021-01-05 ENCOUNTER — Ambulatory Visit (INDEPENDENT_AMBULATORY_CARE_PROVIDER_SITE_OTHER): Payer: 59 | Admitting: Clinical

## 2021-01-05 ENCOUNTER — Telehealth (HOSPITAL_COMMUNITY): Payer: Self-pay | Admitting: *Deleted

## 2021-01-05 ENCOUNTER — Other Ambulatory Visit: Payer: Self-pay

## 2021-01-05 ENCOUNTER — Other Ambulatory Visit (HOSPITAL_COMMUNITY): Payer: Self-pay | Admitting: *Deleted

## 2021-01-05 DIAGNOSIS — F331 Major depressive disorder, recurrent, moderate: Secondary | ICD-10-CM

## 2021-01-05 DIAGNOSIS — F4310 Post-traumatic stress disorder, unspecified: Secondary | ICD-10-CM

## 2021-01-05 DIAGNOSIS — F419 Anxiety disorder, unspecified: Secondary | ICD-10-CM

## 2021-01-05 MED ORDER — DULOXETINE HCL 30 MG PO CPEP
30.0000 mg | ORAL_CAPSULE | Freq: Two times a day (BID) | ORAL | 0 refills | Status: DC
  Filled 2021-01-05 – 2021-01-20 (×2): qty 60, 30d supply, fill #0

## 2021-01-05 NOTE — Telephone Encounter (Signed)
On her last visit we talked about increasing Symbyax to 6/25 mg if she have anxiety.  I would not recommend to increase Xanax because of dependency and tolerance. If she agree we can call the new prescription of Symbyax to her pharmacy.

## 2021-01-05 NOTE — Telephone Encounter (Signed)
She can try Cymbalta twice a day to help her anxiety.  She is only taking 30 mg and increase the dose may help her anxiety.  If agreed please call to the local pharmacy and keep the Symbyax 6/25 mg.

## 2021-01-05 NOTE — Telephone Encounter (Signed)
Pt called to request increasing xanax dosage. Pt currently taking 0.5 mg bid prn. Pt states that she has been having panic attacks and feels that increasing Xanax to 1 mg would be beneficial. Please review and advise.
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
## 2021-01-05 NOTE — Telephone Encounter (Signed)
Symbyax was recently increased to 6/25 on 11/2620 so pt was asking for increase in Xanax. However she is amiable to increasing Symbyax again if needed.

## 2021-01-05 NOTE — Progress Notes (Signed)
Seen by patient Desiree Russell on 12/25/2020  5:13 PM

## 2021-01-11 ENCOUNTER — Other Ambulatory Visit: Payer: Self-pay

## 2021-01-11 ENCOUNTER — Encounter: Payer: Self-pay | Admitting: Nurse Practitioner

## 2021-01-11 ENCOUNTER — Other Ambulatory Visit (HOSPITAL_COMMUNITY): Payer: Self-pay

## 2021-01-11 ENCOUNTER — Ambulatory Visit (INDEPENDENT_AMBULATORY_CARE_PROVIDER_SITE_OTHER): Payer: 59 | Admitting: Nurse Practitioner

## 2021-01-11 VITALS — BP 116/68 | HR 66 | Temp 97.7°F | Ht 64.0 in

## 2021-01-11 DIAGNOSIS — Z1231 Encounter for screening mammogram for malignant neoplasm of breast: Secondary | ICD-10-CM | POA: Diagnosis not present

## 2021-01-11 DIAGNOSIS — Z0000 Encounter for general adult medical examination without abnormal findings: Secondary | ICD-10-CM

## 2021-01-11 DIAGNOSIS — Z114 Encounter for screening for human immunodeficiency virus [HIV]: Secondary | ICD-10-CM | POA: Diagnosis not present

## 2021-01-11 DIAGNOSIS — Z130 Encounter for screening for diseases of the blood and blood-forming organs and certain disorders involving the immune mechanism: Secondary | ICD-10-CM

## 2021-01-11 DIAGNOSIS — E7800 Pure hypercholesterolemia, unspecified: Secondary | ICD-10-CM

## 2021-01-11 DIAGNOSIS — Z1211 Encounter for screening for malignant neoplasm of colon: Secondary | ICD-10-CM

## 2021-01-11 DIAGNOSIS — Z1159 Encounter for screening for other viral diseases: Secondary | ICD-10-CM

## 2021-01-11 DIAGNOSIS — Z1329 Encounter for screening for other suspected endocrine disorder: Secondary | ICD-10-CM | POA: Diagnosis not present

## 2021-01-11 DIAGNOSIS — Z13220 Encounter for screening for lipoid disorders: Secondary | ICD-10-CM | POA: Diagnosis not present

## 2021-01-11 DIAGNOSIS — G43909 Migraine, unspecified, not intractable, without status migrainosus: Secondary | ICD-10-CM

## 2021-01-11 LAB — POCT URINALYSIS DIPSTICK
Bilirubin, UA: NEGATIVE
Blood, UA: NEGATIVE
Glucose, UA: NEGATIVE
Ketones, UA: NEGATIVE
Leukocytes, UA: NEGATIVE
Nitrite, UA: NEGATIVE
Protein, UA: NEGATIVE
Spec Grav, UA: 1.025 (ref 1.010–1.025)
Urobilinogen, UA: 0.2 E.U./dL
pH, UA: 6 (ref 5.0–8.0)

## 2021-01-11 MED ORDER — METOPROLOL SUCCINATE ER 50 MG PO TB24
50.0000 mg | ORAL_TABLET | Freq: Every day | ORAL | 3 refills | Status: DC
  Filled 2021-01-11: qty 90, 90d supply, fill #0

## 2021-01-11 NOTE — Patient Instructions (Signed)
Health Maintenance, Female Adopting a healthy lifestyle and getting preventive care are important in promoting health and wellness. Ask your health care provider about: The right schedule for you to have regular tests and exams. Things you can do on your own to prevent diseases and keep yourself healthy. What should I know about diet, weight, and exercise? Eat a healthy diet  Eat a diet that includes plenty of vegetables, fruits, low-fat dairy products, and lean protein. Do not eat a lot of foods that are high in solid fats, added sugars, or sodium.  Maintain a healthy weight Body mass index (BMI) is used to identify weight problems. It estimates body fat based on height and weight. Your health care provider can help determineyour BMI and help you achieve or maintain a healthy weight. Get regular exercise Get regular exercise. This is one of the most important things you can do for your health. Most adults should: Exercise for at least 150 minutes each week. The exercise should increase your heart rate and make you sweat (moderate-intensity exercise). Do strengthening exercises at least twice a week. This is in addition to the moderate-intensity exercise. Spend less time sitting. Even light physical activity can be beneficial. Watch cholesterol and blood lipids Have your blood tested for lipids and cholesterol at 48 years of age, then havethis test every 5 years. Have your cholesterol levels checked more often if: Your lipid or cholesterol levels are high. You are older than 48 years of age. You are at high risk for heart disease. What should I know about cancer screening? Depending on your health history and family history, you may need to have cancer screening at various ages. This may include screening for: Breast cancer. Cervical cancer. Colorectal cancer. Skin cancer. Lung cancer. What should I know about heart disease, diabetes, and high blood pressure? Blood pressure and heart  disease High blood pressure causes heart disease and increases the risk of stroke. This is more likely to develop in people who have high blood pressure readings, are of African descent, or are overweight. Have your blood pressure checked: Every 3-5 years if you are 18-39 years of age. Every year if you are 40 years old or older. Diabetes Have regular diabetes screenings. This checks your fasting blood sugar level. Have the screening done: Once every three years after age 40 if you are at a normal weight and have a low risk for diabetes. More often and at a younger age if you are overweight or have a high risk for diabetes. What should I know about preventing infection? Hepatitis B If you have a higher risk for hepatitis B, you should be screened for this virus. Talk with your health care provider to find out if you are at risk forhepatitis B infection. Hepatitis C Testing is recommended for: Everyone born from 1945 through 1965. Anyone with known risk factors for hepatitis C. Sexually transmitted infections (STIs) Get screened for STIs, including gonorrhea and chlamydia, if: You are sexually active and are younger than 48 years of age. You are older than 48 years of age and your health care provider tells you that you are at risk for this type of infection. Your sexual activity has changed since you were last screened, and you are at increased risk for chlamydia or gonorrhea. Ask your health care provider if you are at risk. Ask your health care provider about whether you are at high risk for HIV. Your health care provider may recommend a prescription medicine to help   prevent HIV infection. If you choose to take medicine to prevent HIV, you should first get tested for HIV. You should then be tested every 3 months for as long as you are taking the medicine. Pregnancy If you are about to stop having your period (premenopausal) and you may become pregnant, seek counseling before you get  pregnant. Take 400 to 800 micrograms (mcg) of folic acid every day if you become pregnant. Ask for birth control (contraception) if you want to prevent pregnancy. Osteoporosis and menopause Osteoporosis is a disease in which the bones lose minerals and strength with aging. This can result in bone fractures. If you are 65 years old or older, or if you are at risk for osteoporosis and fractures, ask your health care provider if you should: Be screened for bone loss. Take a calcium or vitamin D supplement to lower your risk of fractures. Be given hormone replacement therapy (HRT) to treat symptoms of menopause. Follow these instructions at home: Lifestyle Do not use any products that contain nicotine or tobacco, such as cigarettes, e-cigarettes, and chewing tobacco. If you need help quitting, ask your health care provider. Do not use street drugs. Do not share needles. Ask your health care provider for help if you need support or information about quitting drugs. Alcohol use Do not drink alcohol if: Your health care provider tells you not to drink. You are pregnant, may be pregnant, or are planning to become pregnant. If you drink alcohol: Limit how much you use to 0-1 drink a day. Limit intake if you are breastfeeding. Be aware of how much alcohol is in your drink. In the [place] one drink equals one 12 oz bottle of beer (355 mL), one 5 oz glass of wine (148 mL), or one 1 oz glass of hard liquor (44 mL). General instructions Schedule regular health, dental, and eye exams. Stay current with your vaccines. Tell your health care provider if: You often feel depressed. You have ever been abused or do not feel safe at home. Summary Adopting a healthy lifestyle and getting preventive care are important in promoting health and wellness. Follow your health care provider's instructions about healthy diet, exercising, and getting tested or screened for diseases. Follow your health care provider's  instructions on monitoring your cholesterol and blood pressure. This information is not intended to replace advice given to you by your health care provider. Make sure you discuss any questions you have with your healthcare provider. Document Revised: 07/09/2018 Document Reviewed: 07/09/2018 Elsevier Patient Education  2022 [employer] Inc.  

## 2021-01-11 NOTE — Progress Notes (Signed)
[address], [place]  [address] Phone:  [phone]   Fax:  [phone]   Established Patient Office Visit  Subjective:  Patient ID: Desiree Russell, female    DOB: 1971-06-20  Age: 50 y.o. MRN: [id number]  CC:  Chief Complaint  Patient presents with   Annual Exam    Joint and muscle pain, fibromyalgia     HPI Desiree Russell presents for an annual exam.  She  has a past medical history of Anxiety, Dyslipidemia, Fibromyalgia, Headache, Pericarditis, Seizures ([place]), Tachycardia, and TIA (transient ischemic attack).   She reports that she is doing better overall today.  She continues to a well-balanced and healthy diet avoiding fat and fried food.  She is walking 5 miles per day.  She is anticipating to see how her cholesterol has improved.  She has lost weight however did not want to have a weight tested today.  She declined Pap test due to previous hysterectomy.  She is concerned however that her fibromyalgia continues to be a problem.  She was given a recent prescription for Duexis however this has not been filled related to prior authorization.  She is currently prescribed duloxetine 30 mg along with gabapentin 100 mg.  She reports that she continues to follow-up with psychiatry current treatment is effective.  She reports that she is not having as many visual hallucinations.  Past Medical History:  Diagnosis Date   Anxiety    Dyslipidemia    Fibromyalgia    Headache    Pericarditis    Seizures ([facility])    Tachycardia    TIA (transient ischemic attack)    Recurrent    Past Surgical History:  Procedure Laterality Date   ABDOMINAL HYSTERECTOMY     CHOLECYSTECTOMY, LAPAROSCOPIC      Family History  Problem Relation Age of Onset   Hypertension Mother    Hypertension Father    Heart attack Father 57   Healthy Sister    Diabetes type II Brother    Hypertension Maternal Grandmother    Diabetes Paternal Grandmother    Healthy  Sister    Healthy Sister    COPD Brother    Healthy Brother    Healthy Brother     Social History   Socioeconomic History   Marital status: Married    Spouse name: Not on file   Number of children: Not on file   Years of education: Not on file   Highest education level: Not on file  Occupational History   Not on file  Tobacco Use   Smoking status: Never   Smokeless tobacco: Never  Substance and Sexual Activity   Alcohol use: Yes    Comment: occ    Drug use: No   Sexual activity: Not on file  Other Topics Concern   Not on file  Social History Narrative   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     Social Determinants of Health   Financial Resource Strain: Not on file  Food Insecurity: Not on file  Transportation Needs: Not on file  Physical Activity: Not on file  Stress: Not on file  Social Connections: Not on file  Intimate Partner Violence: Not on file    Outpatient Medications Prior to Visit  Medication Sig Dispense Refill   albuterol (VENTOLIN HFA) 108 (90 Base) MCG/ACT inhaler INHALE 2 PUFFS INTO THE LUNGS EVERY 4 HOURS AS NEEDED FOR WHEEZING. 8.5 g 0  ALPRAZolam (XANAX) 0.5 MG tablet Take 1 tablet (0.5 mg total) by mouth 2 (two) times daily as needed for anxiety. 45 tablet 1   aspirin 81 MG EC tablet aspirin     B Complex Vitamins (VITAMIN B COMPLEX PO) Take 1 capsule by mouth daily.     Cholecalciferol (VITAMIN D3) 1000 units CAPS Take 1 tablet by mouth daily.     Cranberry-Vitamin C-Probiotic (AZO CRANBERRY PO) Take 2 capsules by mouth daily.     diclofenac sodium (VOLTAREN) 1 % GEL APPLY TO CHEST TWO TIMES DAILY AS NEEDED     DULoxetine (CYMBALTA) 30 MG capsule Take 1 capsule (30 mg total) by mouth 2 (two) times daily. 60 capsule 0   esomeprazole (NEXIUM) 20 MG packet Take 20 mg by mouth daily before breakfast.     gabapentin (NEURONTIN) 100 MG capsule Take 1 capsule by mouth at bedtime. 90 capsule 3   Galcanezumab-gnlm (EMGALITY) 120  MG/ML SOAJ Inject into the skin. Inject once a month.     ibuprofen (ADVIL,MOTRIN) 800 MG tablet Take 800 mg by mouth every 8 (eight) hours as needed.     Ibuprofen-Famotidine (DUEXIS) 800-26.6 MG TABS Take 1 tablet by mouth in the morning, at noon, and at bedtime. 90 tablet 3   OLANZapine-FLUoxetine (SYMBYAX) 6-25 MG capsule Take 1 capsule by mouth at bedtime. 30 capsule 1   sennosides-docusate sodium (SENOKOT-S) 8.6-50 MG tablet Take 12 tablets by mouth daily as needed for constipation.     Ubrogepant (UBRELVY) 100 MG TABS Ubrelvy 100 mg tablet  PLEASE SEE ATTACHED FOR DETAILED DIRECTIONS     valACYclovir (VALTREX) 500 MG tablet Take 1 tablet (500 mg total) by mouth daily. 90 tablet 1   metoprolol succinate (TOPROL-XL) 100 MG 24 hr tablet TAKE 1 TABLET BY MOUTH ONCE A DAY 30 tablet 1   OLANZapine-FLUoxetine (SYMBYAX) 3-25 MG capsule Take 1 capsule by mouth at bedtime. 30 capsule 0   pregabalin (LYRICA) 25 MG capsule Take 2 capsules (50 mg total) by mouth at bedtime. (Patient not taking: Reported on 12/15/2020) 60 capsule 0   scopolamine (TRANSDERM-SCOP) 1 MG/3DAYS Place 1 patch onto the skin every 3 (three) days. (Patient not taking: Reported on 12/15/2020)     No facility-administered medications prior to visit.    Allergies  Allergen Reactions   Codeine Hives and Rash    Hives  Other reaction(s): Other (See Comments) Other reaction(s): Other (see comments) Hives Hives  Hives Hives   Hives  Hives  Hives  Hives Hives  Other reaction(s): Other (see comments) Hives Other reaction(s): Other (see comments) Hives    Morphine Hives    Other reaction(s): Headache, Other (See Comments)   Morphine And Related Hives   Oxycodone Hives   Chlorhexidine Hives    rash rash rash rash     ROS Review of Systems    Objective:    Physical Exam Constitutional:      General: She is not in acute distress.    Appearance: She is not ill-appearing, toxic-appearing or diaphoretic.   HENT:     Head: Normocephalic and atraumatic.  Cardiovascular:     Rate and Rhythm: Normal rate and regular rhythm.     Pulses: Normal pulses.     Heart sounds: Normal heart sounds.  Pulmonary:     Effort: Pulmonary effort is normal.     Breath sounds: Normal breath sounds.  Abdominal:     Palpations: Abdomen is soft.  Musculoskeletal:  General: Normal range of motion.     Cervical back: Normal range of motion.  Skin:    General: Skin is warm and dry.     Capillary Refill: Capillary refill takes less than 2 seconds.  Neurological:     General: No focal deficit present.     Mental Status: She is alert and oriented to person, place, and time.  Psychiatric:        Mood and Affect: Mood normal.        Behavior: Behavior normal.        Thought Content: Thought content normal.        Judgment: Judgment normal.   BP 116/68 (BP Location: Left Arm, Patient Position: Sitting)   Pulse 66   Temp 97.7 F (36.5 C)   Ht 5\' 4"  (1.626 m)   SpO2 100%   BMI 35.46 kg/m  Wt Readings from Last 3 Encounters:  11/29/20 208 lb (94.3 kg)  10/27/20 217 lb (98.4 kg)  03/14/18 208 lb (94.3 kg)     There are no preventive care reminders to display for this patient.   There are no preventive care reminders to display for this patient.  Lab Results  Component Value Date   TSH 1.010 01/11/2021   Lab Results  Component Value Date   WBC 10.8 10/27/2020   HGB 12.7 10/27/2020   HCT 39.0 10/27/2020   MCV 91 10/27/2020   PLT 279 10/27/2020   Lab Results  Component Value Date   NA 137 12/12/2014   K 4.0 12/12/2014   CO2 24 12/12/2014   GLUCOSE 90 12/12/2014   BUN 10 12/12/2014   CREATININE 1.02 (H) 12/12/2014   CALCIUM 8.8 (L) 12/12/2014   ANIONGAP 6 12/12/2014   Lab Results  Component Value Date   CHOL 243 (H) 10/27/2020   Lab Results  Component Value Date   HDL 58 10/27/2020   Lab Results  Component Value Date   LDLCALC 171 (H) 10/27/2020   Lab Results  Component  Value Date   TRIG 83 10/27/2020   Lab Results  Component Value Date   CHOLHDL 4.2 10/27/2020   No results found for: HGBA1C    Assessment & Plan:   Problem List Items Addressed This Visit   None Visit Diagnoses     Healthcare maintenance    -  Primary Discussed female health maintenance; SBE, annual CBE, PAP test declined Discussed regular hydration with water Discussed healthy diet and exercise and weight management Discussed mental health Encouraged to call our office for an appointment with in ongoing concerns for questions.     Relevant Orders   Urinalysis Dipstick (Completed)   MM DIGITAL SCREENING BILATERAL   Screening for iron deficiency anemia       Screening for deficiency anemia       Relevant Orders   Vitamin B12 (Completed)   VITAMIN D 25 Hydroxy (Vit-D Deficiency, Fractures) (Completed)   Encounter for hepatitis C screening test for low risk patient       Relevant Orders   Hepatitis C antibody (Completed)   Screening for HIV (human immunodeficiency virus)       Relevant Orders   HIV antibody (with reflex)   Screening for thyroid disorder       Relevant Orders   TSH (Completed)   Screening for lipid disorders       Relevant Orders   Lipid panel   Screening for colon cancer       Relevant Orders  Ambulatory referral to Gastroenterology   Screening mammogram, encounter for       Relevant Orders   MM DIGITAL SCREENING BILATERAL   Elevated LDL cholesterol level           No orders of the defined types were placed in this encounter.   Follow-up: Return in about 6 months (around 07/13/2021) for lab only and then general follow up [id number].    Vevelyn Francois, NP

## 2021-01-11 NOTE — Addendum Note (Signed)
Addended by: Beatrix Fetters on: 01/11/2021 05:21 PM   Modules accepted: Orders

## 2021-01-12 LAB — VITAMIN D 25 HYDROXY (VIT D DEFICIENCY, FRACTURES): Vit D, 25-Hydroxy: 36.7 ng/mL (ref 30.0–100.0)

## 2021-01-12 LAB — TSH: TSH: 1.01 u[IU]/mL (ref 0.450–4.500)

## 2021-01-12 LAB — HIV ANTIBODY (ROUTINE TESTING W REFLEX): HIV Screen 4th Generation wRfx: NONREACTIVE

## 2021-01-12 LAB — HEPATITIS C ANTIBODY: Hep C Virus Ab: <0.1 s/co ratio (ref 0.0–0.9)

## 2021-01-12 LAB — VITAMIN B12: Vitamin B-12: 431 pg/mL (ref 232–1245)

## 2021-01-18 NOTE — Telephone Encounter (Signed)
Opened in error

## 2021-01-19 ENCOUNTER — Ambulatory Visit (HOSPITAL_COMMUNITY): Payer: 59 | Admitting: Clinical

## 2021-01-20 ENCOUNTER — Other Ambulatory Visit (HOSPITAL_COMMUNITY): Payer: Self-pay

## 2021-01-20 ENCOUNTER — Telehealth: Payer: Self-pay | Admitting: Internal Medicine

## 2021-01-20 ENCOUNTER — Inpatient Hospital Stay (HOSPITAL_COMMUNITY)
Admission: EM | Admit: 2021-01-20 | Discharge: 2021-01-23 | DRG: 064 | Disposition: A | Discharge status: Home Health | Payer: 59 | Attending: Internal Medicine | Admitting: Internal Medicine

## 2021-01-20 ENCOUNTER — Other Ambulatory Visit: Payer: Self-pay

## 2021-01-20 ENCOUNTER — Emergency Department (HOSPITAL_COMMUNITY): Payer: 59

## 2021-01-20 DIAGNOSIS — Z8249 Family history of ischemic heart disease and other diseases of the circulatory system: Secondary | ICD-10-CM

## 2021-01-20 DIAGNOSIS — Z9049 Acquired absence of other specified parts of digestive tract: Secondary | ICD-10-CM | POA: Diagnosis not present

## 2021-01-20 DIAGNOSIS — M25511 Pain in right shoulder: Secondary | ICD-10-CM | POA: Diagnosis not present

## 2021-01-20 DIAGNOSIS — E785 Hyperlipidemia, unspecified: Secondary | ICD-10-CM | POA: Diagnosis present

## 2021-01-20 DIAGNOSIS — Z20822 Contact with and (suspected) exposure to covid-19: Secondary | ICD-10-CM | POA: Diagnosis not present

## 2021-01-20 DIAGNOSIS — I639 Cerebral infarction, unspecified: Secondary | ICD-10-CM | POA: Diagnosis not present

## 2021-01-20 DIAGNOSIS — Z90710 Acquired absence of both cervix and uterus: Secondary | ICD-10-CM | POA: Diagnosis not present

## 2021-01-20 DIAGNOSIS — R519 Headache, unspecified: Secondary | ICD-10-CM | POA: Diagnosis not present

## 2021-01-20 DIAGNOSIS — R29704 NIHSS score 4: Secondary | ICD-10-CM | POA: Diagnosis present

## 2021-01-20 DIAGNOSIS — Z9181 History of falling: Secondary | ICD-10-CM

## 2021-01-20 DIAGNOSIS — R440 Auditory hallucinations: Secondary | ICD-10-CM | POA: Diagnosis not present

## 2021-01-20 DIAGNOSIS — M25551 Pain in right hip: Secondary | ICD-10-CM | POA: Diagnosis not present

## 2021-01-20 DIAGNOSIS — I69351 Hemiplegia and hemiparesis following cerebral infarction affecting right dominant side: Secondary | ICD-10-CM

## 2021-01-20 DIAGNOSIS — I6781 Acute cerebrovascular insufficiency: Secondary | ICD-10-CM | POA: Diagnosis not present

## 2021-01-20 DIAGNOSIS — Z79899 Other long term (current) drug therapy: Secondary | ICD-10-CM

## 2021-01-20 DIAGNOSIS — Z6835 Body mass index (BMI) 35.0-35.9, adult: Secondary | ICD-10-CM | POA: Diagnosis not present

## 2021-01-20 DIAGNOSIS — R29810 Facial weakness: Secondary | ICD-10-CM | POA: Diagnosis present

## 2021-01-20 DIAGNOSIS — I10 Essential (primary) hypertension: Secondary | ICD-10-CM | POA: Diagnosis not present

## 2021-01-20 DIAGNOSIS — K219 Gastro-esophageal reflux disease without esophagitis: Secondary | ICD-10-CM | POA: Diagnosis present

## 2021-01-20 DIAGNOSIS — F060 Psychotic disorder with hallucinations due to known physiological condition: Secondary | ICD-10-CM

## 2021-01-20 DIAGNOSIS — M40202 Unspecified kyphosis, cervical region: Secondary | ICD-10-CM | POA: Diagnosis not present

## 2021-01-20 DIAGNOSIS — Z8673 Personal history of transient ischemic attack (TIA), and cerebral infarction without residual deficits: Secondary | ICD-10-CM | POA: Diagnosis not present

## 2021-01-20 DIAGNOSIS — Z7982 Long term (current) use of aspirin: Secondary | ICD-10-CM | POA: Diagnosis not present

## 2021-01-20 DIAGNOSIS — Z713 Dietary counseling and surveillance: Secondary | ICD-10-CM

## 2021-01-20 DIAGNOSIS — F29 Unspecified psychosis not due to a substance or known physiological condition: Secondary | ICD-10-CM | POA: Diagnosis present

## 2021-01-20 DIAGNOSIS — Z888 Allergy status to other drugs, medicaments and biological substances status: Secondary | ICD-10-CM

## 2021-01-20 DIAGNOSIS — G9341 Metabolic encephalopathy: Secondary | ICD-10-CM | POA: Diagnosis not present

## 2021-01-20 DIAGNOSIS — Z8616 Personal history of COVID-19: Secondary | ICD-10-CM

## 2021-01-20 DIAGNOSIS — M797 Fibromyalgia: Secondary | ICD-10-CM | POA: Diagnosis present

## 2021-01-20 DIAGNOSIS — F418 Other specified anxiety disorders: Secondary | ICD-10-CM | POA: Diagnosis not present

## 2021-01-20 DIAGNOSIS — G43909 Migraine, unspecified, not intractable, without status migrainosus: Secondary | ICD-10-CM | POA: Diagnosis present

## 2021-01-20 DIAGNOSIS — Z885 Allergy status to narcotic agent status: Secondary | ICD-10-CM

## 2021-01-20 DIAGNOSIS — I310 Chronic adhesive pericarditis: Secondary | ICD-10-CM | POA: Diagnosis not present

## 2021-01-20 DIAGNOSIS — F339 Major depressive disorder, recurrent, unspecified: Secondary | ICD-10-CM | POA: Diagnosis present

## 2021-01-20 DIAGNOSIS — Q283 Other malformations of cerebral vessels: Secondary | ICD-10-CM | POA: Diagnosis not present

## 2021-01-20 DIAGNOSIS — I6389 Other cerebral infarction: Secondary | ICD-10-CM | POA: Diagnosis not present

## 2021-01-20 DIAGNOSIS — W19XXXA Unspecified fall, initial encounter: Secondary | ICD-10-CM

## 2021-01-20 DIAGNOSIS — R0789 Other chest pain: Secondary | ICD-10-CM | POA: Diagnosis not present

## 2021-01-20 DIAGNOSIS — R0902 Hypoxemia: Secondary | ICD-10-CM | POA: Diagnosis not present

## 2021-01-20 DIAGNOSIS — G8929 Other chronic pain: Secondary | ICD-10-CM | POA: Diagnosis not present

## 2021-01-20 DIAGNOSIS — F4310 Post-traumatic stress disorder, unspecified: Secondary | ICD-10-CM | POA: Diagnosis present

## 2021-01-20 DIAGNOSIS — R4781 Slurred speech: Secondary | ICD-10-CM | POA: Diagnosis not present

## 2021-01-20 DIAGNOSIS — F444 Conversion disorder with motor symptom or deficit: Secondary | ICD-10-CM | POA: Diagnosis not present

## 2021-01-20 DIAGNOSIS — M50323 Other cervical disc degeneration at C6-C7 level: Secondary | ICD-10-CM | POA: Diagnosis not present

## 2021-01-20 DIAGNOSIS — E669 Obesity, unspecified: Secondary | ICD-10-CM | POA: Diagnosis present

## 2021-01-20 DIAGNOSIS — I6501 Occlusion and stenosis of right vertebral artery: Secondary | ICD-10-CM | POA: Diagnosis not present

## 2021-01-20 DIAGNOSIS — R079 Chest pain, unspecified: Secondary | ICD-10-CM | POA: Diagnosis not present

## 2021-01-20 LAB — I-STAT CHEM 8, ED
BUN: 14 mg/dL (ref 6–20)
Calcium, Ion: 1.23 mmol/L (ref 1.15–1.40)
Chloride: 105 mmol/L (ref 98–111)
Creatinine, Ser: 1 mg/dL (ref 0.44–1.00)
Glucose, Bld: 85 mg/dL (ref 70–99)
HCT: 39 % (ref 36.0–46.0)
Hemoglobin: 13.3 g/dL (ref 12.0–15.0)
Potassium: 4.4 mmol/L (ref 3.5–5.1)
Sodium: 139 mmol/L (ref 135–145)
TCO2: 24 mmol/L (ref 22–32)

## 2021-01-20 LAB — CBC
HCT: 36.8 % (ref 36.0–46.0)
Hemoglobin: 11.9 g/dL — ABNORMAL LOW (ref 12.0–15.0)
MCH: 29.8 pg (ref 26.0–34.0)
MCHC: 32.3 g/dL (ref 30.0–36.0)
MCV: 92.2 fL (ref 80.0–100.0)
Platelets: 214 10*3/uL (ref 150–400)
RBC: 3.99 MIL/uL (ref 3.87–5.11)
RDW: 13.6 % (ref 11.5–15.5)
WBC: 5 10*3/uL (ref 4.0–10.5)
nRBC: 0 % (ref 0.0–0.2)

## 2021-01-20 LAB — PROTIME-INR
INR: 1 (ref 0.8–1.2)
Prothrombin Time: 12.8 seconds (ref 11.4–15.2)

## 2021-01-20 LAB — COMPREHENSIVE METABOLIC PANEL
ALT: 15 U/L (ref 0–44)
AST: 19 U/L (ref 15–41)
Albumin: 3.5 g/dL (ref 3.5–5.0)
Alkaline Phosphatase: 39 U/L (ref 38–126)
Anion gap: 5 (ref 5–15)
BUN: 13 mg/dL (ref 6–20)
CO2: 27 mmol/L (ref 22–32)
Calcium: 8.9 mg/dL (ref 8.9–10.3)
Chloride: 104 mmol/L (ref 98–111)
Creatinine, Ser: 1.07 mg/dL — ABNORMAL HIGH (ref 0.44–1.00)
GFR, Estimated: >60 mL/min (ref >60)
Glucose, Bld: 91 mg/dL (ref 70–99)
Potassium: 4.4 mmol/L (ref 3.5–5.1)
Sodium: 136 mmol/L (ref 135–145)
Total Bilirubin: 0.4 mg/dL (ref 0.3–1.2)
Total Protein: 6.5 g/dL (ref 6.5–8.1)

## 2021-01-20 LAB — DIFFERENTIAL
Abs Immature Granulocytes: 0.01 10*3/uL (ref 0.00–0.07)
Basophils Absolute: 0 10*3/uL (ref 0.0–0.1)
Basophils Relative: 0 %
Eosinophils Absolute: 0.1 10*3/uL (ref 0.0–0.5)
Eosinophils Relative: 2 %
Immature Granulocytes: 0 %
Lymphocytes Relative: 42 %
Lymphs Abs: 2.1 10*3/uL (ref 0.7–4.0)
Monocytes Absolute: 0.4 10*3/uL (ref 0.1–1.0)
Monocytes Relative: 8 %
Neutro Abs: 2.4 10*3/uL (ref 1.7–7.7)
Neutrophils Relative %: 48 %

## 2021-01-20 LAB — TROPONIN I (HIGH SENSITIVITY)
Troponin I (High Sensitivity): 5 ng/L (ref <18)
Troponin I (High Sensitivity): 5 ng/L (ref <18)

## 2021-01-20 LAB — I-STAT BETA HCG BLOOD, ED (MC, WL, AP ONLY): I-stat hCG, quantitative: <5 m[IU]/mL (ref <5)

## 2021-01-20 LAB — APTT: aPTT: 28 seconds (ref 24–36)

## 2021-01-20 LAB — CBG MONITORING, ED
Glucose-Capillary: 85 mg/dL (ref 70–99)
Glucose-Capillary: 92 mg/dL (ref 70–99)

## 2021-01-20 IMAGING — MR MR HEAD W/O CM
9 of 11 series · 32 of 48 positions shown · non-contrast
Comparison: Same day CT code stroke.  MRI [CH].

CLINICAL DATA: Neuro deficit, acute stroke suspected.

EXAM:
MRI HEAD WITHOUT CONTRAST
TECHNIQUE: Multiplanar, multiecho pulse sequences of the brain and surrounding
structures were obtained without intravenous contrast.

[Series 3: DWI · axial · 3.0mm · 0.94mm/px · z∈[-94,+42]mm · 7 of 104 slices shown (1 of 2)]
[im 1/104]
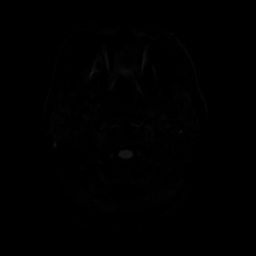
[im 18/104]
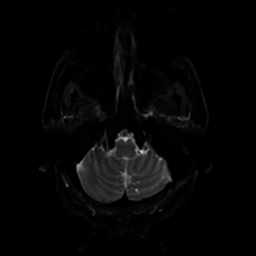
[im 35/104]
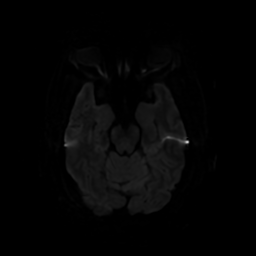
[im 52/104]
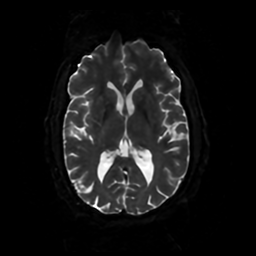
[im 69/104]
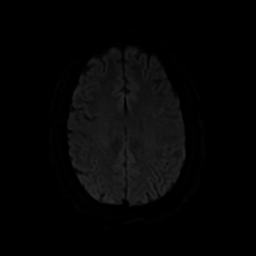
[im 86/104]
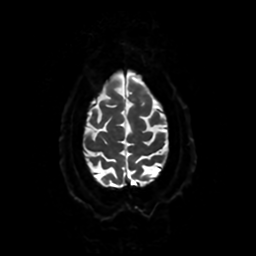
[im 104/104]
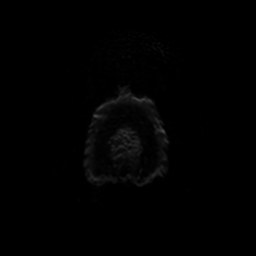

[Series 4: DWI · coronal · 4.0mm · 0.94mm/px · 5 of 74 slices shown (2 of 2)]
[im 1/74]
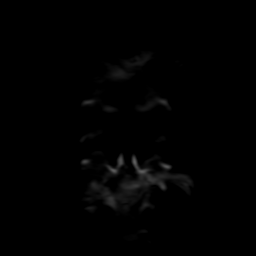
[im 19/74]
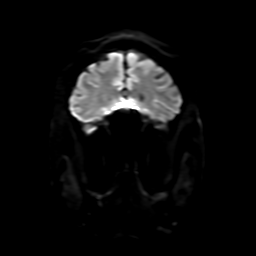
[im 37/74]
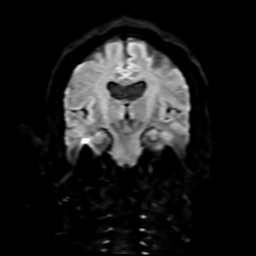
[im 55/74]
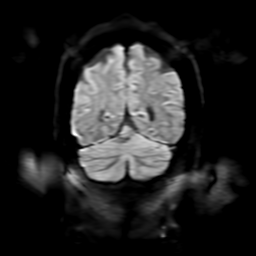
[im 74/74]
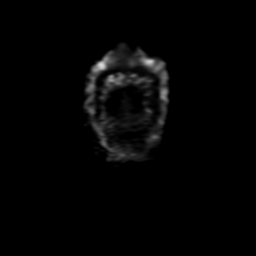

[Series 5: FLAIR · axial · 4.0mm · 0.45mm/px · z∈[-90,+43]mm · 3 of 35 slices shown (1 of 2)]
[im 1/35]
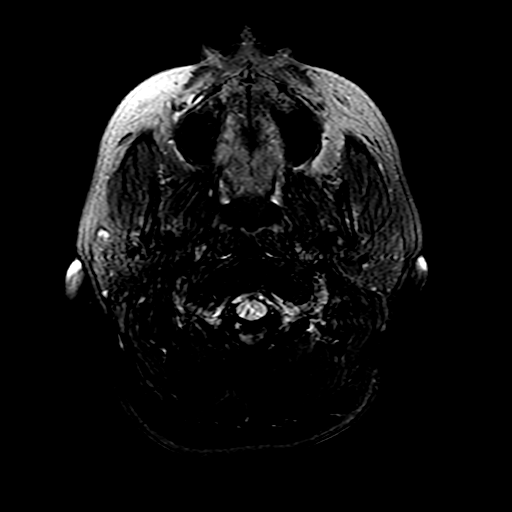
[im 18/35]
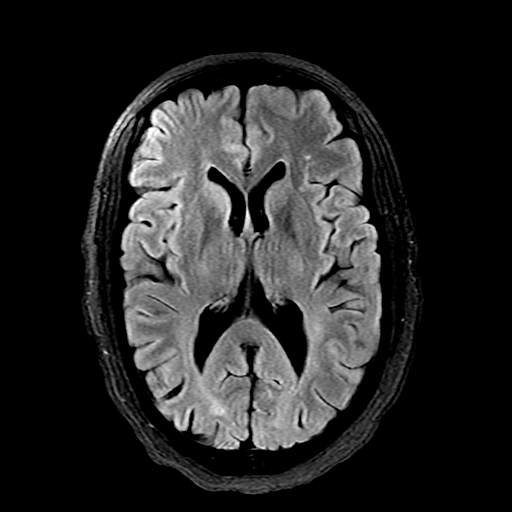
[im 35/35]
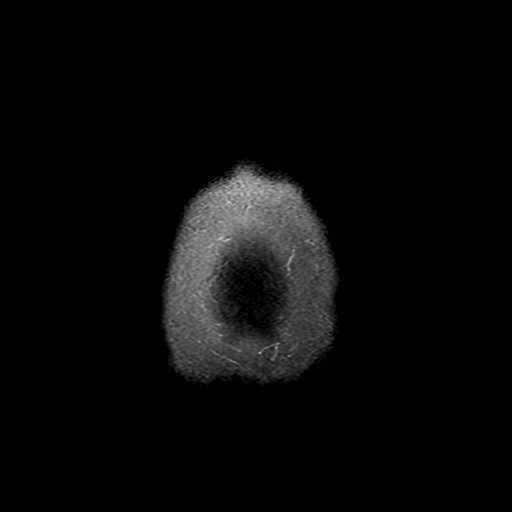

[Series 6: FLAIR · sagittal · 5.0mm · 0.47mm/px · 2 of 25 slices shown (2 of 2)]
[im 1/25]
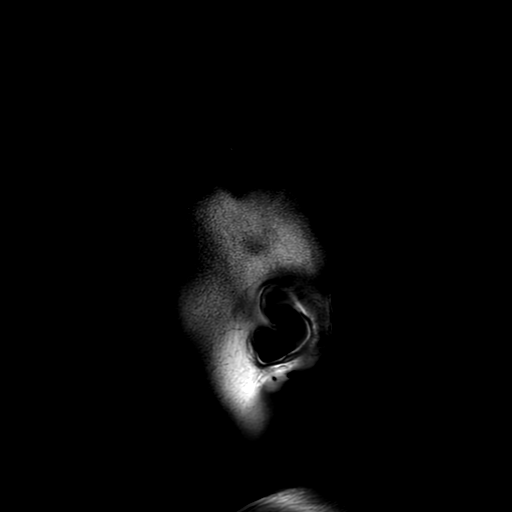
[im 25/25]
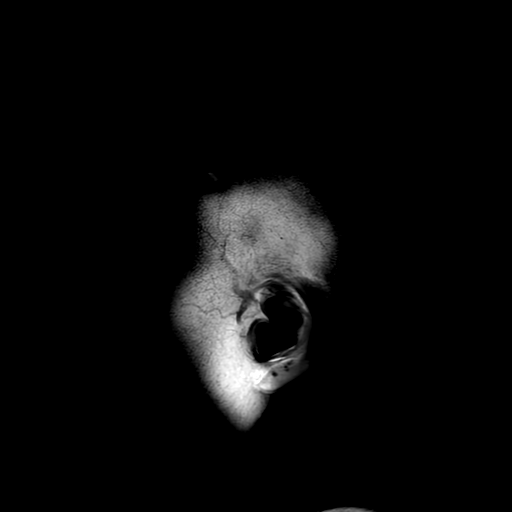

[Series 7: T2 · axial · 5.0mm · 0.47mm/px · z∈[-93,+41]mm · 2 of 26 slices shown (1 of 2)]
[im 1/26]
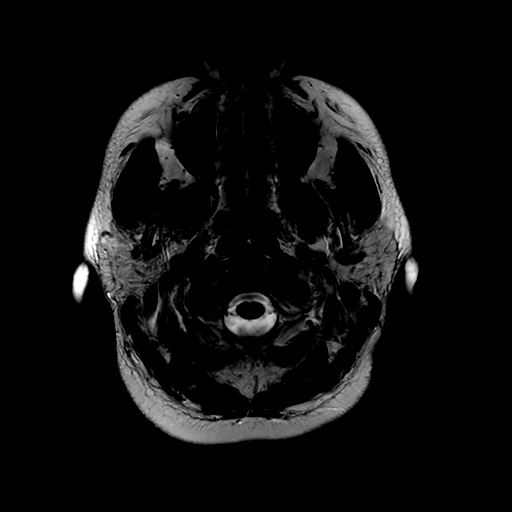
[im 26/26]
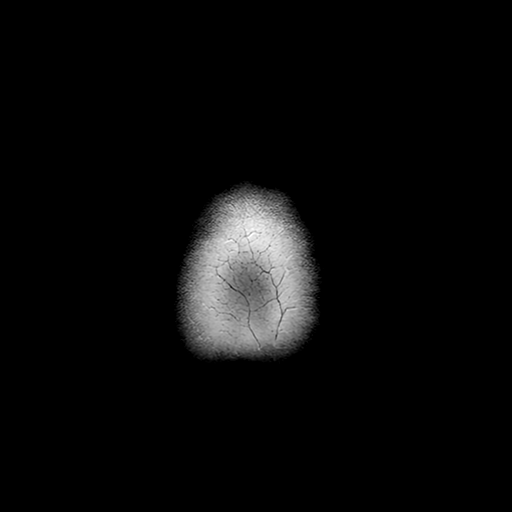

[Series 8: (person_name) · axial · 3.0mm · 0.47mm/px · z∈[-95,-36]mm · 4 of 104 slices shown]
[im 1/104]
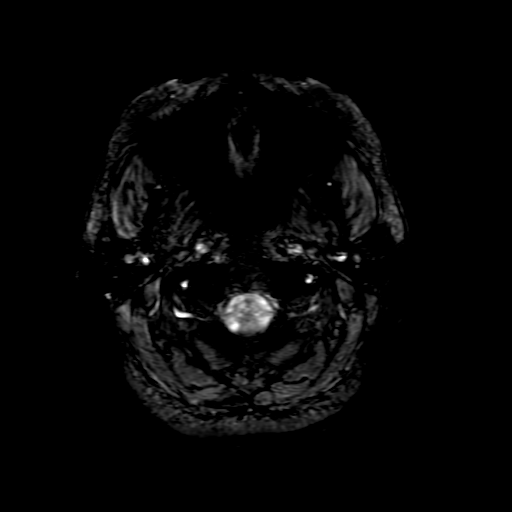
[im 15/104]
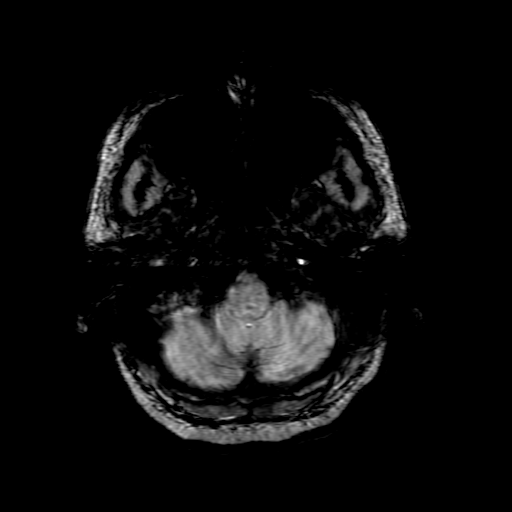
[im 30/104]
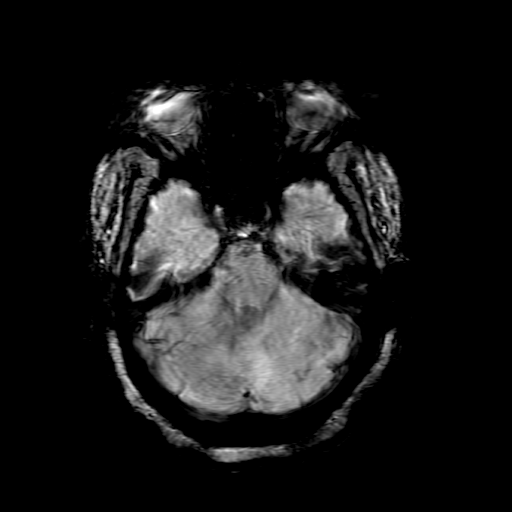
[im 45/104]
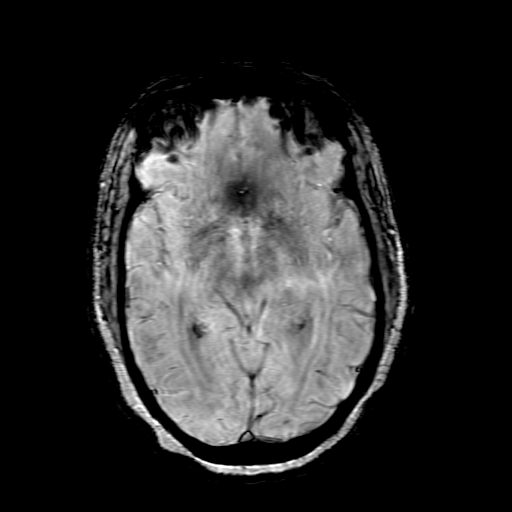

[Series 10: T2 · coronal · 5.0mm · 0.39mm/px · 2 of 31 slices shown (2 of 2)]
[im 1/31]
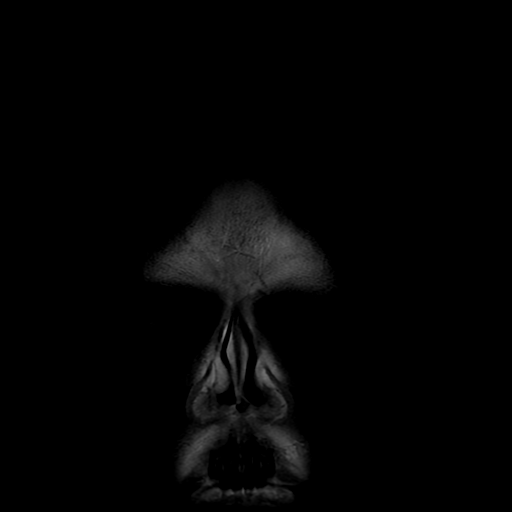
[im 31/31]
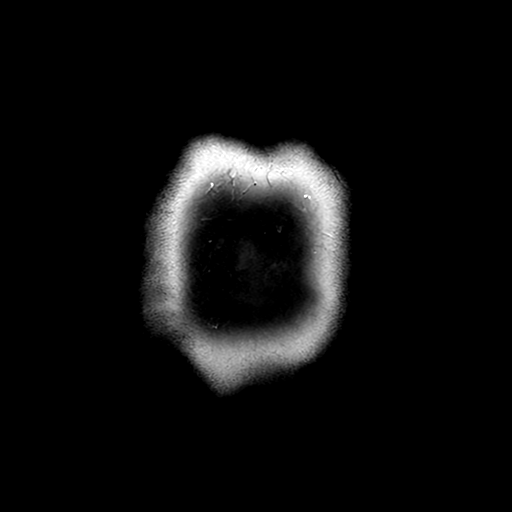

[Series 350: ADC · axial · 3.0mm · 0.94mm/px · z∈[-94,+42]mm · 4 of 51 slices shown (1 of 2)]
[im 1/51]
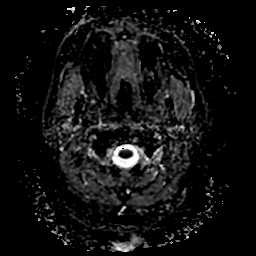
[im 17/51]
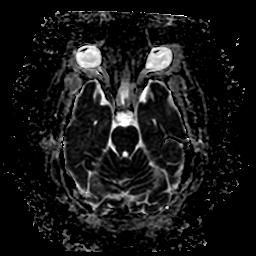
[im 34/51]
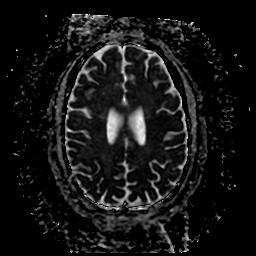
[im 51/51]
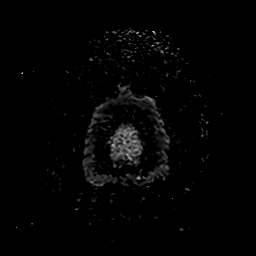

[Series 450: ADC · coronal · 4.0mm · 0.94mm/px · 3 of 37 slices shown (2 of 2)]
[im 1/37]
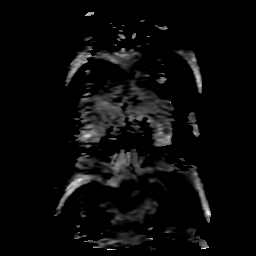
[im 19/37]
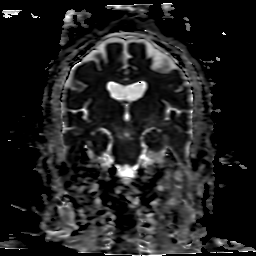
[im 37/37]
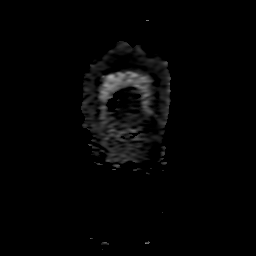

[32 of 48 positions shown; findings below may reference images not displayed]

FINDINGS: Brain: Probable punctate acute or early subacute infarct in the
right parietal lobe (series 3, image 27). No substantial edema or
mass effect. Remote left greater than right cerebellar lacunar
infarcts. Suspected small remote right frontal cortical infarct
(series 7, image 19) no hydrocephalus. No acute hemorrhage. No
extra-axial fluid collection. No midline shift. Basal cisterns are
patent. Mild T2/FLAIR hyperintensities within the frontal
predominant white matter.

Vascular: Major arterial flow voids are maintained at the skull
base.

Skull and upper cervical spine: Normal marrow signal.

Sinuses/Orbits: Largely clear sinuses.  Unremarkable orbits.

Other: No mastoid effusions.
IMPRESSION: 1. Probable punctate acute or early subacute infarct in the right
parietal lobe (series 3, image 27). No significant edema or mass
effect.
2. Remote cerebellar lacunar infarcts and suspected small remote
right frontal cortical infarct.
3. Mild T2/FLAIR hyperintensities within the frontal predominant
white matter, most likely related to chronic microvascular ischemia
or chronic migraines.

## 2021-01-20 IMAGING — DX DG CHEST 1V PORT
1 series · 1 of 1 positions shown · non-contrast
Comparison: [DATE] and older studies.

CLINICAL DATA: Chest tightness.

EXAM:
PORTABLE CHEST 1 VIEW

[chest ap]
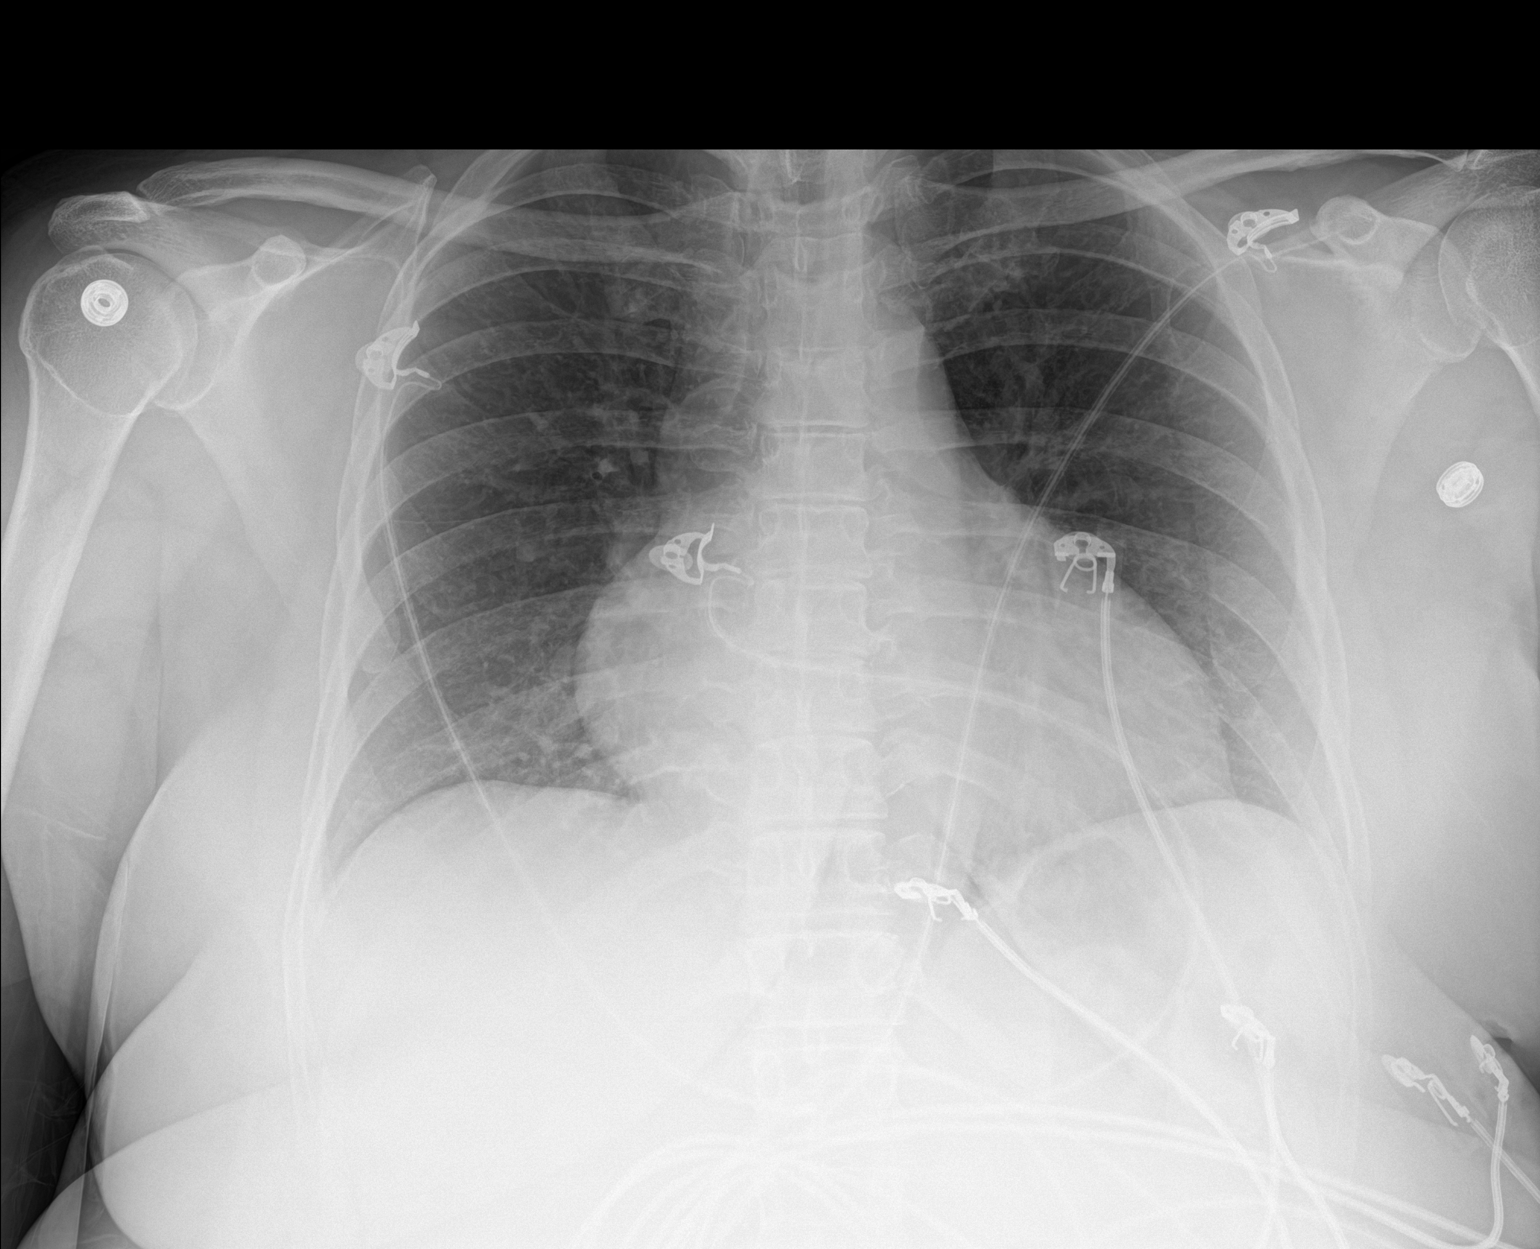

[1 of 1 positions shown; findings below may reference images not displayed]

FINDINGS: Cardiac silhouette is borderline enlarged. No mediastinal or hilar
masses.

Clear lungs.  No pleural effusion or pneumothorax.

Skeletal structures are grossly intact.
IMPRESSION: No active disease.

## 2021-01-20 IMAGING — CT CT HEAD CODE STROKE
4 series · 16 of 47 positions shown, 18 images · non-contrast
Comparison: Head CT and MRI [DATE]

CLINICAL DATA: Code stroke. Right-sided weakness, headache, and
aphasia.

EXAM:
CT HEAD WITHOUT CONTRAST
TECHNIQUE: Contiguous axial images were obtained from the base of the skull
through the vertex without intravenous contrast.

[Series 3: head wo · axial · 0.42mm/px · z∈[-143,-23]mm · 7 of 33 slices shown, 9 images]
[im 5/33  brain]
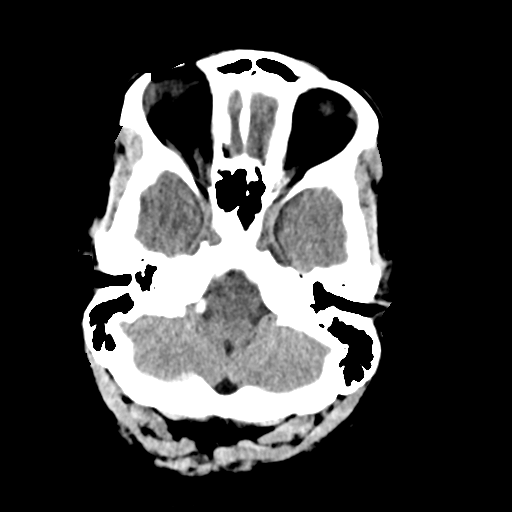
[im 5/33  bone]
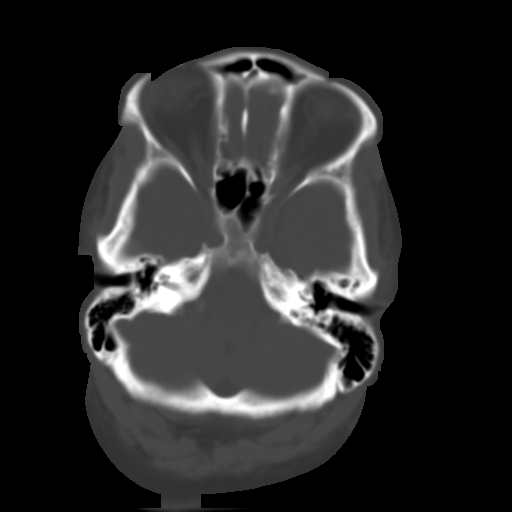
[im 9/33  brain]
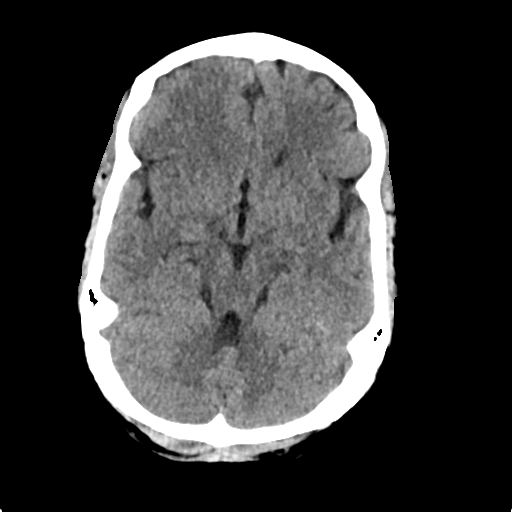
[im 13/33  brain]
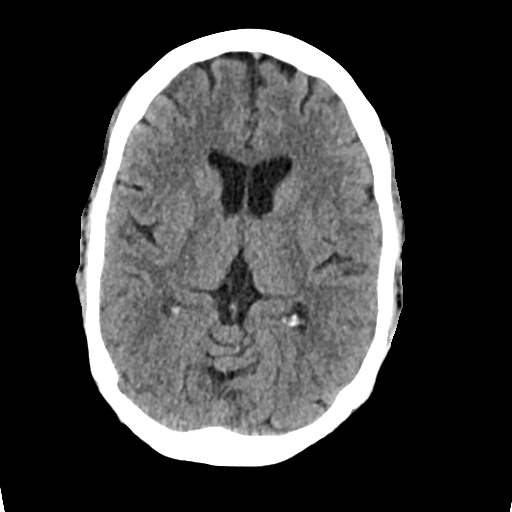
[im 17/33  brain]
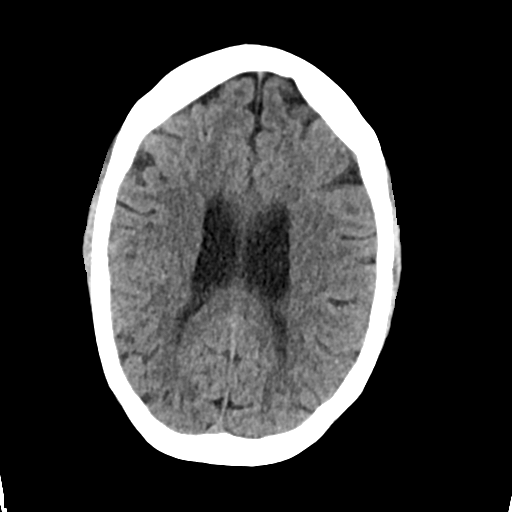
[im 21/33  brain]
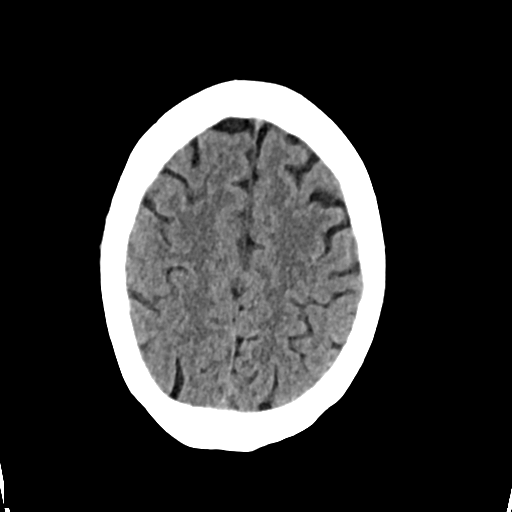
[im 21/33  bone]
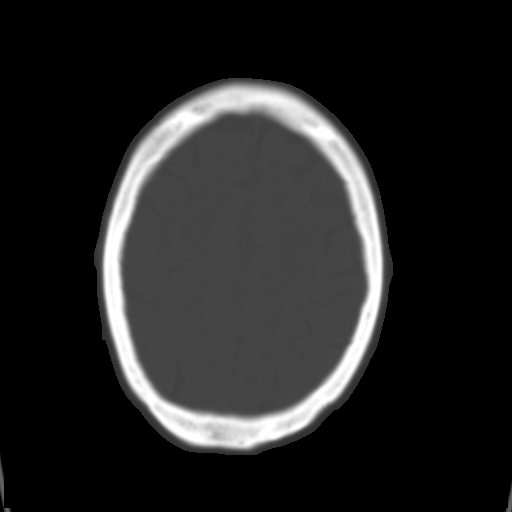
[im 25/33  brain]
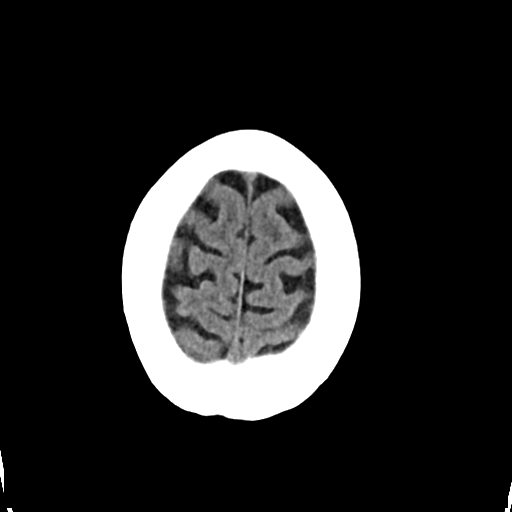
[im 29/33  brain]
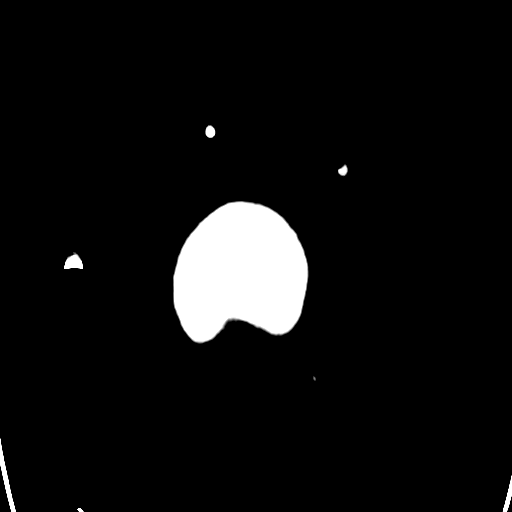

[Series 5: head bone · axial · 0.42mm/px · z∈[-147,-115]mm · 3 of 83 slices shown]
[im 9/83  bone]
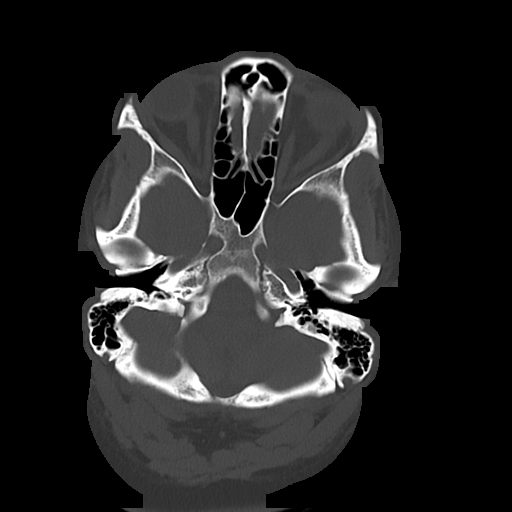
[im 17/83  bone]
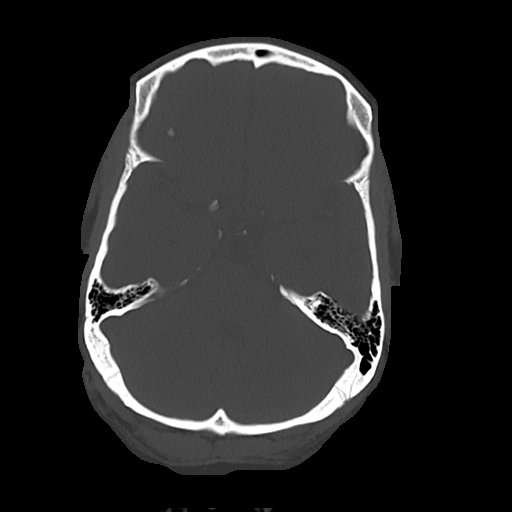
[im 25/83  bone]
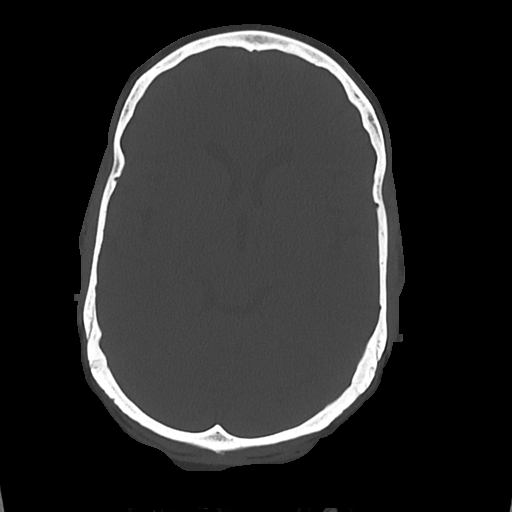

[Series 6: cor soft · coronal · 0.32mm/px · 3 of 69 slices shown]
[im 23/69  brain]
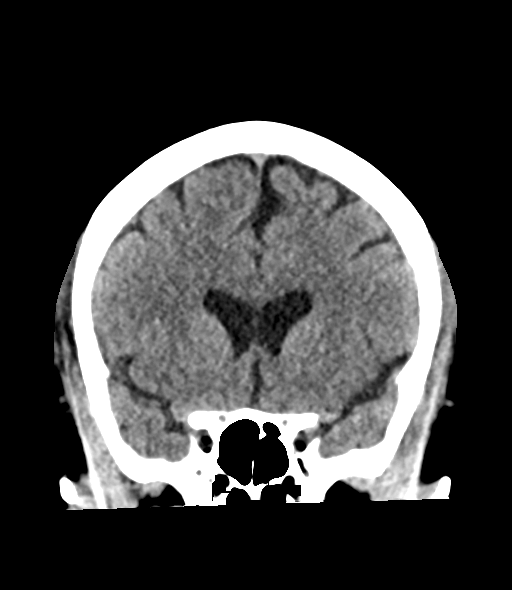
[im 31/69  brain]
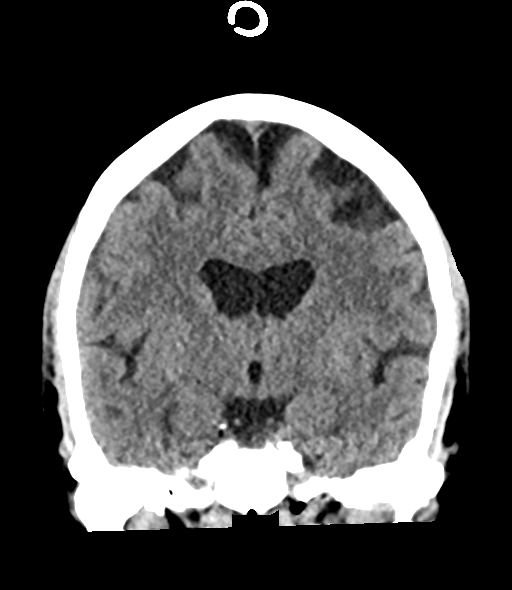
[im 38/69  brain]
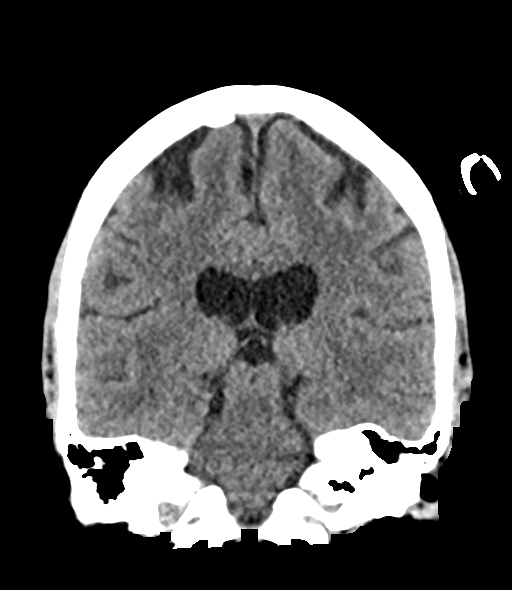

[Series 7: sag soft · sagittal · 0.37mm/px · 3 of 55 slices shown]
[im 19/55  brain]
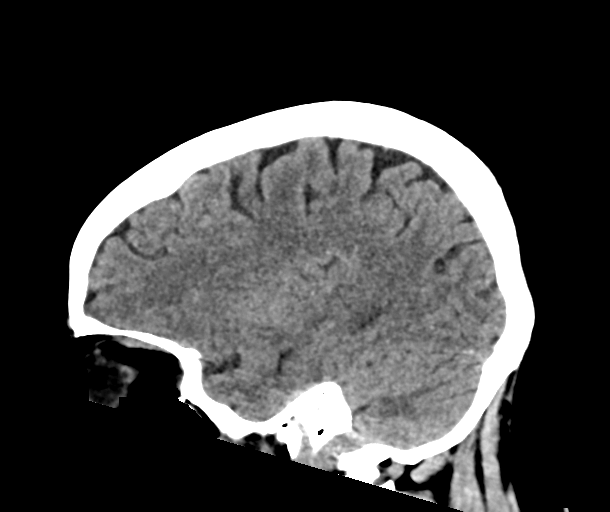
[im 28/55  brain]
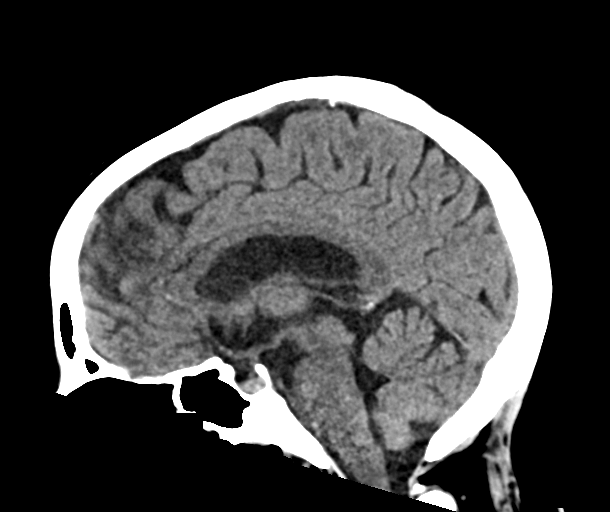
[im 37/55  brain]
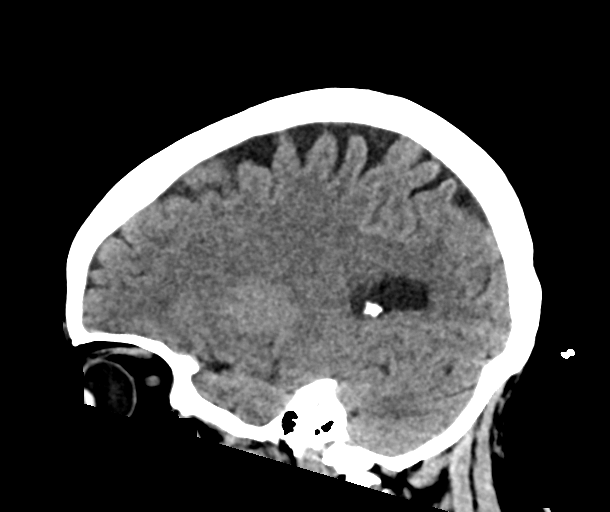

[16 of 47 positions shown; findings below may reference images not displayed]

FINDINGS: Brain: There is no evidence of an acute infarct, intracranial
hemorrhage, mass, midline shift, or extra-axial fluid collection. A
small chronic infarct is again noted in the right middle frontal
gyrus, and there are small chronic cerebellar infarcts. The
ventricles are normal in size.

Vascular: No hyperdense vessel.

Skull: No fracture or suspicious osseous lesion.

Sinuses/Orbits: Visualized paranasal sinuses and mastoid air cells
are clear. Rightward gaze.

Other: None.

ASPECTS ([place] Stroke Program Early CT Score)

- Ganglionic level infarction (caudate, lentiform nuclei, internal
capsule, insula, M1-M3 cortex): 7

- Supraganglionic infarction (M4-M6 cortex): 3

Total score (0-10 with 10 being normal): 10
IMPRESSION: 1. No evidence of acute intracranial abnormality.
2. ASPECTS is 10.
3. Small chronic right frontal and cerebellar infarcts.

These results were communicated to Dr. RUHEPO at [DATE] on
[DATE] by text page via the [employer] messaging system.

## 2021-01-20 MED ORDER — POLYETHYLENE GLYCOL 3350 17 G PO PACK: 17.0000 g | PACK | Freq: Every day | ORAL | Status: DC | PRN

## 2021-01-20 MED ORDER — PANTOPRAZOLE SODIUM 40 MG PO TBEC
40.0000 mg | DELAYED_RELEASE_TABLET | Freq: Every day | ORAL | Status: DC
  Administered 2021-01-21 – 2021-01-23 (×3): 40 mg via ORAL
  Filled 2021-01-20 (×3): qty 1

## 2021-01-20 MED ORDER — LORAZEPAM 2 MG/ML IJ SOLN
1.0000 mg | Freq: Once | INTRAMUSCULAR | Status: AC | PRN
  Administered 2021-01-20: 1 mg via INTRAVENOUS
  Filled 2021-01-20: qty 1

## 2021-01-20 MED ORDER — ASPIRIN EC 81 MG PO TBEC
81.0000 mg | DELAYED_RELEASE_TABLET | Freq: Every day | ORAL | Status: DC
  Administered 2021-01-20 – 2021-01-23 (×4): 81 mg via ORAL
  Filled 2021-01-20 (×4): qty 1

## 2021-01-20 MED ORDER — LORAZEPAM 2 MG/ML IJ SOLN
2.0000 mg | Freq: Once | INTRAMUSCULAR | Status: AC
  Administered 2021-01-20: 2 mg via INTRAVENOUS

## 2021-01-20 MED ORDER — KETOROLAC TROMETHAMINE 15 MG/ML IJ SOLN
15.0000 mg | Freq: Once | INTRAMUSCULAR | Status: AC
  Administered 2021-01-20: 15 mg via INTRAVENOUS
  Filled 2021-01-20: qty 1

## 2021-01-20 MED ORDER — DULOXETINE HCL 30 MG PO CPEP
30.0000 mg | ORAL_CAPSULE | Freq: Two times a day (BID) | ORAL | Status: DC
  Administered 2021-01-21 – 2021-01-23 (×6): 30 mg via ORAL
  Filled 2021-01-20 (×7): qty 1

## 2021-01-20 MED ORDER — DIPHENHYDRAMINE HCL 50 MG/ML IJ SOLN
25.0000 mg | Freq: Once | INTRAMUSCULAR | Status: AC
  Administered 2021-01-20: 25 mg via INTRAVENOUS
  Filled 2021-01-20: qty 1

## 2021-01-20 MED ORDER — UBROGEPANT 100 MG PO TABS: 100.0000 mg | ORAL_TABLET | ORAL | Status: DC | PRN

## 2021-01-20 MED ORDER — MELATONIN 3 MG PO TABS: 3.0000 mg | ORAL_TABLET | Freq: Every evening | ORAL | Status: DC | PRN

## 2021-01-20 MED ORDER — GABAPENTIN 100 MG PO CAPS
100.0000 mg | ORAL_CAPSULE | Freq: Every day | ORAL | Status: DC
  Administered 2021-01-21: 100 mg via ORAL
  Filled 2021-01-20 (×2): qty 1

## 2021-01-20 MED ORDER — LABETALOL HCL 5 MG/ML IV SOLN: 5.0000 mg | INTRAVENOUS | Status: DC | PRN

## 2021-01-20 MED ORDER — LACTATED RINGERS IV SOLN: INTRAVENOUS | Status: AC

## 2021-01-20 MED ORDER — ENOXAPARIN SODIUM 40 MG/0.4ML IJ SOSY
40.0000 mg | PREFILLED_SYRINGE | INTRAMUSCULAR | Status: DC
  Administered 2021-01-21 – 2021-01-22 (×3): 40 mg via SUBCUTANEOUS
  Filled 2021-01-20 (×4): qty 0.4

## 2021-01-20 MED ORDER — LORAZEPAM 2 MG/ML IJ SOLN: 2.0000 mg | Freq: Four times a day (QID) | INTRAMUSCULAR | Status: DC | PRN

## 2021-01-20 MED ORDER — METOCLOPRAMIDE HCL 5 MG/ML IJ SOLN
10.0000 mg | Freq: Once | INTRAMUSCULAR | Status: AC
  Administered 2021-01-20: 10 mg via INTRAVENOUS
  Filled 2021-01-20: qty 2

## 2021-01-20 MED ORDER — LORAZEPAM 2 MG/ML IJ SOLN
INTRAMUSCULAR | Status: AC
  Filled 2021-01-20: qty 1

## 2021-01-20 MED ORDER — ONDANSETRON HCL 4 MG/2ML IJ SOLN: 4.0000 mg | Freq: Four times a day (QID) | INTRAMUSCULAR | Status: DC | PRN

## 2021-01-20 MED ORDER — ATORVASTATIN CALCIUM 80 MG PO TABS
80.0000 mg | ORAL_TABLET | Freq: Every day | ORAL | Status: DC
  Filled 2021-01-20: qty 2

## 2021-01-20 MED ORDER — VALACYCLOVIR HCL 500 MG PO TABS
500.0000 mg | ORAL_TABLET | Freq: Every day | ORAL | Status: DC
  Administered 2021-01-21: 500 mg via ORAL
  Filled 2021-01-20: qty 1

## 2021-01-20 MED ORDER — SODIUM CHLORIDE 0.9 % IV BOLUS
1000.0000 mL | Freq: Once | INTRAVENOUS | Status: AC
  Administered 2021-01-20: 1000 mL via INTRAVENOUS

## 2021-01-20 MED ORDER — ESOMEPRAZOLE MAGNESIUM 20 MG PO PACK: 20.0000 mg | PACK | Freq: Every day | ORAL | Status: DC

## 2021-01-20 MED ORDER — STROKE: EARLY STAGES OF RECOVERY BOOK
Freq: Once | Status: AC
  Filled 2021-01-20: qty 1

## 2021-01-20 MED ORDER — ALPRAZOLAM 0.5 MG PO TABS
0.5000 mg | ORAL_TABLET | Freq: Two times a day (BID) | ORAL | Status: DC | PRN
  Administered 2021-01-21 – 2021-01-23 (×2): 0.5 mg via ORAL
  Filled 2021-01-20 (×2): qty 1

## 2021-01-20 MED ORDER — ACETAMINOPHEN 325 MG PO TABS
650.0000 mg | ORAL_TABLET | Freq: Four times a day (QID) | ORAL | Status: DC | PRN
  Administered 2021-01-21 – 2021-01-22 (×3): 650 mg via ORAL
  Filled 2021-01-20 (×3): qty 2

## 2021-01-20 MED ORDER — SODIUM CHLORIDE 0.9% FLUSH: 3.0000 mL | Freq: Once | INTRAVENOUS | Status: DC

## 2021-01-20 NOTE — ED Provider Notes (Signed)
[facility] EMERGENCY DEPARTMENT Provider Note   CSN: [id number] Arrival date & time: 01/20/21  0930  LEVEL 5 CAVEAT - DIFFICULTY SPEAKING  History Chief Complaint  Patient presents with   Code Stroke    Desiree Russell is a 50 y.o. female.  HPI 50 year old female presents as a code stroke.  History is from EMS and patient.  Limited due to the patient having trouble speaking.  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  She is having headache that is global, right-sided weakness, trouble speaking.  She is also having chest tightness.  The headache is worse than the chest tightness.  She also reports a history of prior stroke (maybe TIA) that affected her right side as well.  Past Medical History:  Diagnosis Date   Anxiety    Dyslipidemia    Fibromyalgia    Headache    Pericarditis    Seizures (HCC)    Tachycardia    TIA (transient ischemic attack)    Recurrent    Patient Active Problem List   Diagnosis Date Noted   Compulsive skin picking 10/24/2020   Brain fog 10/24/2020   History of COVID-19 10/24/2020   Cough 10/24/2020   Right sided weakness 03/14/2018   Chronic migraine 03/14/2018   Loss of consciousness ([place]) 03/14/2018   Chronic adhesive idiopathic pericarditis 10/15/2017   Panic attacks 10/05/2017   Paroxysmal SVT (supraventricular tachycardia) ([place]) 10/05/2017   Other forms of dyspnea 10/03/2017   Sinus tachycardia 10/03/2017   Episode of recurrent major depressive disorder ([place]) 09/27/2017   Thrombocytopenia ([place]) 09/13/2017   Normocytic normochromic anemia 09/13/2017   Pericardial effusion 09/12/2017   Atypical chest pain 06/18/2016   Palpitations 06/18/2016   Depression 06/13/2016   Pseudoseizures ([place]) 06/08/2016   Seizure ([place]) 06/08/2016   Somatization disorder 06/08/2016   Anxiety 12/28/2015   TIA (transient ischemic attack) 12/28/2015   Tremor 12/28/2015   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     Family History  Problem Relation Age of Onset   Hypertension Mother    Hypertension Father    Heart attack Father 24   Healthy Sister    Diabetes type II Brother    Hypertension Maternal Grandmother    Diabetes Paternal Grandmother    Healthy Sister    Healthy Sister    COPD Brother    Healthy Brother    Healthy Brother     Social History   Tobacco Use   Smoking status: Never   Smokeless tobacco: Never  Substance Use Topics   Alcohol use: Yes    Comment: occ    Drug use: No    Home Medications Prior to Admission medications   Medication Sig Start Date End Date Taking? Authorizing Provider  albuterol (VENTOLIN HFA) 108 (90 Base) MCG/ACT inhaler INHALE 2 PUFFS INTO THE LUNGS EVERY 4 HOURS AS NEEDED FOR WHEEZING. Patient taking differently: Inhale 2 puffs into the lungs every 4 (four) hours as needed for wheezing or shortness of breath. 10/20/20 10/20/21 Yes Fenton Foy, NP  ALPRAZolam Duanne Moron) 0.5 MG tablet Take 1 tablet (0.5 mg total) by mouth 2 (two) times daily as needed for anxiety. 12/22/20 12/22/21 Yes  [name], MD  aspirin 81 MG EC tablet Take 81 mg by mouth daily.   Yes [provider]  B Complex Vitamins (VITAMIN B COMPLEX PO) Take 1 capsule by mouth daily.   Yes [provider]  cetirizine (ZYRTEC) 10 MG tablet Take 10 mg by mouth daily.   Yes [provider]  Cholecalciferol (VITAMIN D3) 1000 units CAPS Take 1 tablet by mouth daily.   Yes [provider]  Cranberry-Vitamin C-Probiotic (AZO CRANBERRY PO) Take 2 capsules by mouth as needed (UTI).   Yes [provider]  diclofenac sodium (VOLTAREN) 1 % GEL Apply 2 g topically 2 (two) times daily as needed  (apply to chest). 09/13/17  Yes [provider]  DULoxetine (CYMBALTA) 30 MG capsule Take 1 capsule (30 mg total) by mouth 2 (two) times daily. 01/05/21 02/19/21 Yes Arfeen, Arlyce Harman, MD  esomeprazole (NEXIUM) 20 MG packet Take 20 mg by mouth daily before breakfast.   Yes [provider]  gabapentin (NEURONTIN) 100 MG capsule Take 1 capsule by mouth at bedtime. Patient taking differently: Take 200 mg by mouth at bedtime. 12/15/20 12/15/21 Yes King, Crystal M, NP  Galcanezumab-gnlm (EMGALITY) 120 MG/ML SOAJ Inject 120 mg into the skin every 30 (thirty) days.   Yes [provider]  metoprolol succinate (TOPROL-XL) 50 MG 24 hr tablet Take 1 tablet (50 mg total) by mouth daily. 01/11/21 01/11/22 Yes Hochrein, Jeneen Rinks, MD  OLANZapine-FLUoxetine (SYMBYAX) 6-25 MG capsule Take 1 capsule by mouth at bedtime. 12/22/20  Yes Arfeen, Arlyce Harman, MD  sennosides-docusate sodium (SENOKOT-S) 8.6-50 MG tablet Take 4 tablets by mouth at bedtime.   Yes [provider]  Ubrogepant (UBRELVY) 100 MG TABS Take 100 mg by mouth as needed (migraine). 11/18/19  Yes [provider]  valACYclovir (VALTREX) 500 MG tablet Take 1 tablet (500 mg total) by mouth daily. 08/02/20  Yes   Ibuprofen-Famotidine (DUEXIS) 800-26.6 MG TABS Take 1 tablet by mouth in the morning, at noon, and at bedtime. Patient not taking: No sig reported 12/15/20   Vevelyn Francois, NP    Allergies    Codeine, Morphine, Morphine and related, Oxycodone, and Chlorhexidine  Review of Systems   Review of Systems  Unable to perform ROS: Acuity of condition   Physical Exam Updated Vital Signs BP 117/72   Pulse 75   Temp 98.2 F (36.8 C) (Oral)   Resp 18   Ht 5\' 4"  (1.626 m)   Wt 93 kg   SpO2 99%   BMI 35.19 kg/m   Physical Exam Vitals and nursing note reviewed.  Constitutional:      Appearance: She is well-developed.  HENT:     Head: Normocephalic and atraumatic.     Right Ear: External ear normal.     Left Ear:  External ear normal.     Nose: Nose normal.  Eyes:     General:        Right eye: No discharge.        Left eye: No discharge.  Cardiovascular:     Rate and Rhythm: Normal rate and regular rhythm.     Pulses:          Radial pulses are 2+ on the right side and 2+ on the left side.     Heart sounds: Normal heart sounds.  Pulmonary:     Effort: Pulmonary effort is normal.     Breath sounds: Normal breath sounds.  Abdominal:     Palpations: Abdomen  is soft.     Tenderness: There is no abdominal tenderness.  Skin:    General: Skin is warm and dry.  Neurological:     Mental Status: She is alert.     Comments: Subjective decrease sensation throughout her right face, right arm, right leg.  She has quiet and maybe a little slurred speech along with some stuttering.  Perhaps some mild decreased grip strength in the right hand but no drift in her right arm.  Decreased strength when trying to lift right leg off of stretcher.  Normal strength in the left arm and leg.  Psychiatric:        Mood and Affect: Mood is not anxious.    ED Results / Procedures / Treatments   Labs (all labs ordered are listed, but only abnormal results are displayed) Labs Reviewed  CBC - Abnormal; Notable for the following components:      Result Value   Hemoglobin 11.9 (*)    All other components within normal limits  COMPREHENSIVE METABOLIC PANEL - Abnormal; Notable for the following components:   Creatinine, Ser 1.07 (*)    All other components within normal limits  RESP PANEL BY RT-PCR (FLU A&B, COVID) ARPGX2  PROTIME-INR  APTT  DIFFERENTIAL  I-STAT CHEM 8, ED  CBG MONITORING, ED  I-STAT BETA HCG BLOOD, ED ([facility], [facility], [facility] ONLY)  TROPONIN I (HIGH SENSITIVITY)  TROPONIN I (HIGH SENSITIVITY)    EKG EKG Interpretation  Date/Time:  Friday January 20 2021 10:02:08 EDT Ventricular Rate:  81 PR Interval:  132 QRS Duration: 77 QT Interval:  357 QTC Calculation: 415 R Axis:   26 Text Interpretation: Sinus  rhythm Abnormal R-wave progression, early transition LVH by voltage No old tracing to compare Confirmed by Sherwood Gambler [phone]) on 01/20/2021 10:18:04 AM  Radiology DG Chest Portable 1 View  Result Date: 01/20/2021 CLINICAL DATA:  Chest tightness. EXAM: PORTABLE CHEST 1 VIEW COMPARISON:  10/21/2020 and older studies. FINDINGS: Cardiac silhouette is borderline enlarged. No mediastinal or hilar masses. Clear lungs.  No pleural effusion or pneumothorax. Skeletal structures are grossly intact. IMPRESSION: No active disease. Electronically Signed   By: Lajean Manes M.D.   On: 01/20/2021 10:55   CT HEAD CODE STROKE WO CONTRAST  Result Date: 01/20/2021 CLINICAL DATA:  Code stroke. Right-sided weakness, headache, and aphasia. EXAM: CT HEAD WITHOUT CONTRAST TECHNIQUE: Contiguous axial images were obtained from the base of the skull through the vertex without intravenous contrast. COMPARISON:  Head CT and MRI 08/18/2019 FINDINGS: Brain: There is no evidence of an acute infarct, intracranial hemorrhage, mass, midline shift, or extra-axial fluid collection. A small chronic infarct is again noted in the right middle frontal gyrus, and there are small chronic cerebellar infarcts. The ventricles are normal in size. Vascular: No hyperdense vessel. Skull: No fracture or suspicious osseous lesion. Sinuses/Orbits: Visualized paranasal sinuses and mastoid air cells are clear. Rightward gaze. Other: None. ASPECTS [facility] Stroke Program Early CT Score) - Ganglionic level infarction (caudate, lentiform nuclei, internal capsule, insula, M1-M3 cortex): 7 - Supraganglionic infarction (M4-M6 cortex): 3 Total score (0-10 with 10 being normal): 10 IMPRESSION: 1. No evidence of acute intracranial abnormality. 2. ASPECTS is 10. 3. 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 These results were communicated to Dr. Lorrin Goodell at 9:54 am on 01/20/2021 by text page via the [facility] messaging system. Electronically Signed   By: Logan Bores M.D.   On: 01/20/2021 09:53    Procedures Procedures   Medications Ordered in ED  Medications  sodium chloride flush (NS) 0.9 % injection 3 mL (3 mLs Intravenous Not Given 01/20/21 1323)  LORazepam (ATIVAN) injection 1 mg (has no administration in time range)  metoCLOPramide (REGLAN) injection 10 mg (10 mg Intravenous Given 01/20/21 1043)  diphenhydrAMINE (BENADRYL) injection 25 mg (25 mg Intravenous Given 01/20/21 1041)  sodium chloride 0.9 % bolus 1,000 mL (0 mLs Intravenous Stopped 01/20/21 1506)  ketorolac (TORADOL) 15 MG/ML injection 15 mg (15 mg Intravenous Given 01/20/21 1042)    ED Course  I have reviewed the triage vital signs and the nursing notes.  Pertinent labs & imaging results that were available during my care of the patient were reviewed by me and considered in my medical decision making (see chart for details).    MDM Rules/Calculators/A&P                          After initial code stroke screening, I talked to patient more and she notes that she woke up with a headache that is global and diffuse.  She also has a history of complex migraine and wonders if this is similar.  Chest tightness started at work.  Presentation is most consistent with a functional weakness.  Thankfully she has improved with headache treatment that she still has a residual headache.  Neurology is recommending MRI and if negative can be discharged.  Troponins are negative x2.  I have very low suspicion for ACS, dissection, PE.  Highly doubt CNS infection.  Care transferred to Dr. Kathrynn Humble. Final Clinical Impression(s) / ED Diagnoses Final diagnoses:  None    Rx / DC Orders ED Discharge Orders     None        Sherwood Gambler, MD 01/20/21 1544

## 2021-01-20 NOTE — Telephone Encounter (Addendum)
I am offic epulm doc at [place] location. 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 Then her supervisor Jena Gauss RN calle me urgently. At 8.55am 01/20/2021 she developed signs of stroke with right side deficit and tongue droop. EMS arried at 9.05am.  Prior to weakness being observed, she came late to work and complained of dizziness and chest pain. At 8.54am she did not have deficit. Deficit time was 8.55am. I was at her side precisley at 9.00am   Vitals pre and post deficit fine. She was held by several of [name] sitting in chair. She was alert and trying to talk but was dysarthric. Rt side tone weak. Informed EMS she needs to got to [facility] ER  Caren Macadam will be informed by Lara Mulch   I called ER Doc at [facility] ? Dr Ralene Bathe and gave sign out   SIGNATURE    Dr. Brand Males, M.D., F.C.C.P,  Pulmonary and Critical Care Medicine Staff Physician, [place] Director - Interstitial Lung Disease  Program  Pulmonary [place] at [place], [place], [address]  Pager: [phone], If no answer  OR between  19:00-7:00h: page [phone] Telephone (clinical office): [phone] Telephone (research): [phone]  9:07 AM 01/20/2021

## 2021-01-20 NOTE — ED Notes (Signed)
Patient transported to MRI 
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
## 2021-01-20 NOTE — ED Provider Notes (Signed)
  Physical Exam  BP 117/71 (BP Location: Right Arm)   Pulse 76   Temp 97.7 F (36.5 C) (Oral)   Resp 20   Ht 5\' 4"  (1.626 m)   Wt 93 kg   SpO2 100%   BMI 35.19 kg/m   Physical Exam  ED Course/Procedures     Procedures  MDM   Patient was primarily seen by Dr. Regenia Skeeter.  Patient has history of TIA, seizures, anxiety, fibromyalgia.  She had arrived to the ER as a code stroke because of difficulty speaking, headaches, right lower extremity weakness, all sudden.  Initial thought is that this is a complex migraine versus stroke.  MRI was ordered.  Results of the MRI reviewed by me and shared with the patient.  It does show an evidence of punctate stroke.  I spoke with Dr. Milas Gain, neurology, he recommends admission to the hospital.      Varney Biles, MD 01/20/21 [phone]

## 2021-01-20 NOTE — ED Triage Notes (Signed)
[name]. 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 Pt does have a prior hx of TIA. Currently not on a blood thinner. During assessment patient noted to have stuttering of her speech, unable to lift R leg. Pt only complains of sudden onset of a headache. All VSS with EMS. Pt was taken to CT 1 for code stroke work up.

## 2021-01-20 NOTE — Code Documentation (Signed)
Stroke Response Nurse Documentation Code Documentation  Desiree Russell is a 50 y.o. female arriving to [facility] via [facility] on 01/20/2021 with past medical hx of TIA, Fibromyalgia, Seizures, Pericarditis. Code stroke was activated by EMS. Patient from work where she was noted to have some chest fluttering this morning around 0815. When EMS arrived, pt was also noted to be unable to speak with some stuttering and reported some right sided sensation decreased.   Stroke team at the bedside on patient arrival. Labs drawn and patient cleared for CT by Dr. Regenia Skeeter. Patient to CT with team. NIHSS 4, see documentation for details and code stroke times. Patient with disoriented, right leg weakness, and right decreased sensation on exam. The following imaging was completed:  CT. When examining patient, pt was noted to have stuttering, but able to find word needed. Sensory decreased on right arm and face, but reported left leg decreased. No extinction. Reports waking up with a headache. Exam inconsistent with stroke. Patient is not a candidate for tPA per MD Lorrin Goodell due to stroke not suspected.  Care/Plan: MRI. Bedside handoff with ED RN Luiz Iron.    Kathrin Greathouse  Stroke Response RN

## 2021-01-20 NOTE — ED Notes (Signed)
Shantella Blubaugh husband [phone] would like an update when possible, is headed up here as well

## 2021-01-20 NOTE — Progress Notes (Signed)
Called regarding episode of decreased responsiveness. She was given ativan. She apparently had been sitting up, then became less responsive while talking to Dr. Nevada Crane. She was given ativan.   At the time of my evaluation, she was awake, conversant but slow to respond, she had 5/5 strength in BUE and 4/5 RLE, 5/5 LLE.   She apparently does this frequently, recently 2 times per day.   I am concerned that these events seem so frequent. She has seizures on her problem list, but is not on any medications. It may be worthwhile, given the frequency to do EEG for spell characterization, given her improvement, will monitor overnight with plan for overnight EEG in the AM.   Roland Rack, MD [facility] [phone]  If 7pm- 7am, please page neurology on call as listed in [place].

## 2021-01-20 NOTE — H&P (Addendum)
History and Physical  [name] VOZ:[id number] DOB: [birth year] DOA: 01/20/2021  Referring physician: Dr. Kathrynn Humble, [place]. PCP: Vevelyn Francois, NP  Outpatient Specialists: Neurology. Patient coming from: Home.    Chief Complaint: Right sided weakness and facial droop with difficulty getting her words out.  HPI: Desiree Russell is a 50 y.o. female with medical history significant for previous CVA, hypertension, chronic anxiety/depression, fibromyalgia, PTSD, hyperlipidemia, positive COVID-19 screening test 2 months ago, pseudoseizures, who presented to [facility] ED via EMS as a code stroke.  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  Reported waking up this morning with a headache throbbing like a band around her head.  Due to concern for an acute stroke, patient was taken emergently at the CT suite.  CT head showed no evidence of acute abnormality however it showed small chronic right frontal and cerebellar infarcts.  Patient was last known well around 8:15 AM.  She was not given tPA, per neurology not a candidate.  Patient was seen by the neurology/stroke team with recommendation for MRI brain without contrast and to continue antiplatelet aspirin 81 mg daily.  MRI brain revealed probable punctate acute or early subacute infarct in the right parietal lobe.  Neurology recommended admission by hospitalist team for stroke work-up.  Addendum:  During about 35 minutes into my interview and answering questions, around 9:35 pm patient started complaining of R sided headache then shortly felt hot and nauseous.  Stepped away to get a wet towel for her and when I returned she was minimally responsive but protecting her airway, not following commands, starring with tongue sticking out on the right side of her lips.   Due to concern for active seizure activity, ordered a dose of IV ativan 2 mg and stat EEG.  Paged neurology Dr. Leonel Ramsay who will see the patient and provide further recommendations.  Per her husband she has had these episodes off and on for the past 3 years lasting about 30 minutes, 40 minutes to an hour.  ED Course:  Temperature 98.2.  BP 117/71, pulse 76, respiratory rate 20, O2 saturation 100% on room air.  Lab studies essentially unremarkable.  Review of Systems: Review of systems as noted in the HPI. All other systems reviewed and are negative.   Past Medical History:  Diagnosis Date   Anxiety    Dyslipidemia    Fibromyalgia    Headache    Pericarditis    Seizures (HCC)    Tachycardia    TIA (transient ischemic attack)    Recurrent   Past Surgical History:  Procedure Laterality Date   ABDOMINAL HYSTERECTOMY     CHOLECYSTECTOMY, LAPAROSCOPIC      Social History:  reports that she has never smoked. She has never used smokeless tobacco. She reports current alcohol use. She reports that she does not use drugs.   Allergies  Allergen Reactions   Codeine Hives and Rash   Morphine Hives and Other (See Comments)    Headache   Morphine And Related Hives   Oxycodone Hives   Chlorhexidine Hives and Rash         Family History  Problem Relation Age of Onset   Hypertension Mother    Hypertension Father    Heart attack Father 26   Healthy Sister  Diabetes type II Brother    Hypertension Maternal Grandmother    Diabetes Paternal Grandmother    Healthy Sister    Healthy Sister    COPD Brother    Healthy Brother    Healthy Brother       Prior to Admission medications   Medication Sig Start Date End Date Taking? Authorizing Provider  albuterol (VENTOLIN HFA) 108 (90 Base) MCG/ACT inhaler INHALE 2 PUFFS INTO THE LUNGS EVERY 4 HOURS AS NEEDED FOR WHEEZING. Patient taking differently: Inhale 2 puffs into the lungs every 4 (four) hours as needed for wheezing or  shortness of breath. 10/20/20 10/20/21 Yes Fenton Foy, NP  ALPRAZolam Duanne Moron) 0.5 MG tablet Take 1 tablet (0.5 mg total) by mouth 2 (two) times daily as needed for anxiety. 12/22/20 12/22/21 Yes Arfeen, Arlyce Harman, MD  aspirin 81 MG EC tablet Take 81 mg by mouth daily.   Yes [provider]  B Complex Vitamins (VITAMIN B COMPLEX PO) Take 1 capsule by mouth daily.   Yes [provider]  cetirizine (ZYRTEC) 10 MG tablet Take 10 mg by mouth daily.   Yes [provider]  Cholecalciferol (VITAMIN D3) 1000 units CAPS Take 1 tablet by mouth daily.   Yes [provider]  Cranberry-Vitamin C-Probiotic (AZO CRANBERRY PO) Take 2 capsules by mouth as needed (UTI).   Yes [provider]  diclofenac sodium (VOLTAREN) 1 % GEL Apply 2 g topically 2 (two) times daily as needed (apply to chest). 09/13/17  Yes [provider]  DULoxetine (CYMBALTA) 30 MG capsule Take 1 capsule (30 mg total) by mouth 2 (two) times daily. 01/05/21 02/19/21 Yes Arfeen, Arlyce Harman, MD  esomeprazole (NEXIUM) 20 MG packet Take 20 mg by mouth daily before breakfast.   Yes [provider]  gabapentin (NEURONTIN) 100 MG capsule Take 1 capsule by mouth at bedtime. Patient taking differently: Take 200 mg by mouth at bedtime. 12/15/20 12/15/21 Yes King, Crystal M, NP  Galcanezumab-gnlm (EMGALITY) 120 MG/ML SOAJ Inject 120 mg into the skin every 30 (thirty) days.   Yes [provider]  metoprolol succinate (TOPROL-XL) 50 MG 24 hr tablet Take 1 tablet (50 mg total) by mouth daily. 01/11/21 01/11/22 Yes Hochrein, Jeneen Rinks, MD  OLANZapine-FLUoxetine (SYMBYAX) 6-25 MG capsule Take 1 capsule by mouth at bedtime. 12/22/20  Yes Arfeen, Arlyce Harman, MD  sennosides-docusate sodium (SENOKOT-S) 8.6-50 MG tablet Take 4 tablets by mouth at bedtime.   Yes [provider]  Ubrogepant (UBRELVY) 100 MG TABS Take 100 mg by mouth as needed (migraine). 11/18/19  Yes [provider]  valACYclovir  (VALTREX) 500 MG tablet Take 1 tablet (500 mg total) by mouth daily. 08/02/20  Yes   Ibuprofen-Famotidine (DUEXIS) 800-26.6 MG TABS Take 1 tablet by mouth in the morning, at noon, and at bedtime. Patient not taking: No sig reported 12/15/20   Vevelyn Francois, NP    Physical Exam: BP 117/71 (BP Location: Right Arm)   Pulse 76   Temp 97.7 F (36.5 C) (Oral)   Resp 20   Ht 5\' 4"  (1.626 m)   Wt 93 kg   SpO2 100%   BMI 35.19 kg/m   General: 50 y.o. year-old female well developed well nourished in no acute distress.  Alert and oriented x3. Cardiovascular: Regular rate and rhythm with no rubs or gallops.  No thyromegaly or JVD noted.  No lower extremity edema. 2/4 pulses in all 4 extremities. Respiratory: Clear to auscultation with no wheezes or rales.  Good inspiratory effort. Abdomen: Soft nontender nondistended with normal bowel sounds x4 quadrants. Muskuloskeletal: No cyanosis, clubbing or edema noted bilaterally Neuro: CN II-XII intact, strength, sensation, reflexes Skin: No ulcerative lesions noted or rashes Psychiatry: Judgement and insight appear normal. Mood is appropriate for condition and setting          Labs on Admission:  Basic Metabolic Panel: Recent Labs  Lab 01/20/21 0939 01/20/21 0947  NA 136 139  K 4.4 4.4  CL 104 105  CO2 27  --   GLUCOSE 91 85  BUN 13 14  CREATININE 1.07* 1.00  CALCIUM 8.9  --    Liver Function Tests: Recent Labs  Lab 01/20/21 0939  AST 19  ALT 15  ALKPHOS 39  BILITOT 0.4  PROT 6.5  ALBUMIN 3.5   No results for input(s): LIPASE, AMYLASE in the last 168 hours. No results for input(s): AMMONIA in the last 168 hours. CBC: Recent Labs  Lab 01/20/21 0939 01/20/21 0947  WBC 5.0  --   NEUTROABS 2.4  --   HGB 11.9* 13.3  HCT 36.8 39.0  MCV 92.2  --   PLT 214  --    Cardiac Enzymes: No results for input(s): CKTOTAL, CKMB, CKMBINDEX, TROPONINI in the last 168 hours.  BNP (last 3 results) Recent Labs    11/29/20 1617  BNP 7.1     ProBNP (last 3 results) No results for input(s): PROBNP in the last 8760 hours.  CBG: Recent Labs  Lab 01/20/21 0939  GLUCAP 85    Radiological Exams on Admission: MR BRAIN WO CONTRAST  Result Date: 01/20/2021 CLINICAL DATA:  Neuro deficit, acute stroke suspected. EXAM: MRI HEAD WITHOUT CONTRAST TECHNIQUE: Multiplanar, multiecho pulse sequences of the brain and surrounding structures were obtained without intravenous contrast. COMPARISON:  Same day CT code stroke.  MRI [id number]. FINDINGS: Brain: Probable punctate acute or early subacute infarct in the right parietal lobe (series 3, image 27). No substantial edema or mass effect. Remote left greater than right cerebellar lacunar infarcts. Suspected small remote right frontal cortical infarct (series 7, image 19) no hydrocephalus. No acute hemorrhage. No extra-axial fluid collection. No midline shift. Basal cisterns are patent. Mild T2/FLAIR hyperintensities within the frontal predominant white matter. Vascular: Major arterial flow voids are maintained at the skull base. Skull and upper cervical spine: Normal marrow signal. Sinuses/Orbits: Largely clear sinuses.  Unremarkable orbits. Other: No mastoid effusions. IMPRESSION: 1. Probable punctate acute or early subacute infarct in the right parietal lobe (series 3, image 27). No significant edema or mass effect. 2. Remote cerebellar lacunar infarcts and suspected small remote right frontal cortical infarct. 3. Mild T2/FLAIR hyperintensities within the frontal predominant white matter, most likely related to chronic microvascular ischemia or chronic migraines. Electronically Signed   By: Margaretha Sheffield MD   On: 01/20/2021 18:29   DG Chest Portable 1 View  Result Date: 01/20/2021 CLINICAL DATA:  Chest tightness. EXAM: PORTABLE CHEST 1 VIEW COMPARISON:  10/21/2020 and older studies. FINDINGS: Cardiac silhouette is borderline enlarged. No mediastinal or hilar masses. Clear lungs.  No pleural effusion  or pneumothorax. Skeletal structures are grossly intact. IMPRESSION: No active disease. Electronically Signed   By: Lajean Manes M.D.   On: 01/20/2021 10:55   CT HEAD CODE STROKE WO CONTRAST  Result Date: 01/20/2021 CLINICAL DATA:  Code stroke. Right-sided weakness, headache, and aphasia. EXAM: CT HEAD WITHOUT CONTRAST TECHNIQUE: Contiguous axial images were obtained from the base of the skull through the vertex without intravenous contrast.  COMPARISON:  Head CT and MRI 08/18/2019 FINDINGS: Brain: There is no evidence of an acute infarct, intracranial hemorrhage, mass, midline shift, or extra-axial fluid collection. A small chronic infarct is again noted in the right middle frontal gyrus, and there are small chronic cerebellar infarcts. The ventricles are normal in size. Vascular: No hyperdense vessel. Skull: No fracture or suspicious osseous lesion. Sinuses/Orbits: Visualized paranasal sinuses and mastoid air cells are clear. Rightward gaze. Other: None. ASPECTS [facility] Stroke Program Early CT Score) - Ganglionic level infarction (caudate, lentiform nuclei, internal capsule, insula, M1-M3 cortex): 7 - Supraganglionic infarction (M4-M6 cortex): 3 Total score (0-10 with 10 being normal): 10 IMPRESSION: 1. No evidence of acute intracranial abnormality. 2. ASPECTS is 10. 3. 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 These results were communicated to Dr. Lorrin Goodell at 9:54 am on 01/20/2021 by text page via the [facility] messaging system. Electronically Signed   By: Logan Bores M.D.   On: 01/20/2021 09:53    EKG: I independently viewed the EKG done and my findings are as followed: Sinus rhythm rate of 81.  Nonspecific ST-T changes.  QTc 415.  Assessment/Plan Present on Admission:  CVA (cerebral vascular accident) ([place])  Active Problems:   CVA (cerebral vascular accident) ([place])  Probable punctate acute or early subacute infarct of the right parietal lobe, no significant edema or mass-effect seen on  MRI brain. CT head showed no evidence of acute abnormality however it showed findings as stated above.  Patient was last known well around 8:15 AM.  Seen by neurology/stroke team.  Per neurology, not a candidate for tPA. Ongoing stroke work-up. Follow 2D echo with bubble study. Obtain fasting lipid panel in the morning, hemoglobin A1c. Goal LDL less than 70, goal A1c less than 7.0. Frequent neuro-checks PT/OT/speech evaluation. Aspirin 81 mg daily, Lipitor 80 mg daily. Defer antiplatelets recommendations to neurology Permissive hypertension, treat SBP greater than 220 or DBP greater than 110. Continue to closely monitor on telemetry.  Addendum:  Seizure-like activity/Acute metabolic encephalopathy, rule out active seizure  History of pseudoseizures Please see addendum stated above. Stat EEG Seizure precautions IV ativan prn for breakthrough seizures Neurology consult Change level of care to inpatient, stepdown unit  Prior history of cerebellar stroke seen on CT scan States she has a hx of falls Patient was not aware of prior stroke, she was aware of prior TIA for which she took plavix for 3 months and now on asa 81 mg daily No previous stroke work up  Essential hypertension Ongoing permissive hypertension Hold off home oral antihypertensive  Chronic anxiety/depression Follows with psychiatry Resume home regimen.  Chronic headaches Resume home regimen.  GERD Resume home PPI.  Obesity BMI 35 Recommend weight loss outpatient with regular physical activity and healthy dieting.  History of positive COVID-19 screening test 2 months ago Asymptomatic Afebrile, nonseptic appearing O2 saturation 100% on room air   DVT prophylaxis: Subcu Lovenox daily.  Code Status: Full code.  Family Communication: None at bedside.  Disposition Plan: Admit to telemetry medical.  Consults called: Neurology consulted by EDP.  Admission status: Observation status.   Status is:  Observation    Dispo:  Patient From: Home  Planned Disposition: Home, possibly on 01/21/2021, once stroke work-up is completed and neurology signs off.  Medically stable for discharge: No      Kayleen Memos MD [facility] Pager [phone]  If 7PM-7AM, please contact night-coverage [email or web address] Password [facility]  01/20/2021, 8:38 PM

## 2021-01-20 NOTE — ED Notes (Signed)
Pt used bedside commode, transferred easily.

## 2021-01-20 NOTE — Progress Notes (Signed)
Brief Neuro Update:  Reviewed MRI Brain and it demosntrates a R parietal punctate infarct. I think this is incidental and does not explain her presentation.  She will need to get a stroke workup thou.  Recs: Plan: - Medicine admission. - Frequent Neuro checks per stroke unit protocol - Recommend Vascular imaging with MRA Angio Head without contrast and US Carotid doppler - Recommend obtaining TTE - Recommend obtaining Lipid panel with LDL - Please start statin if LDL > 70 - Recommend HbA1c - Antithrombotic - Aspirin 81mg  daily. - Recommend DVT ppx - SBP goal - permissive hypertension first 24 h < 220/110. Held home meds.  - Recommend Telemetry monitoring for arrythmia - Recommend bedside swallow screen prior to PO intake. - Stroke education booklet - Recommend PT/OT/SLP consult  Discussed with Dr. Kathrynn Humble.  [place] Pager Number [phone]

## 2021-01-21 ENCOUNTER — Inpatient Hospital Stay (HOSPITAL_COMMUNITY): Payer: 59

## 2021-01-21 ENCOUNTER — Encounter (HOSPITAL_COMMUNITY): Payer: 59

## 2021-01-21 ENCOUNTER — Encounter (HOSPITAL_COMMUNITY): Payer: Self-pay | Admitting: Internal Medicine

## 2021-01-21 DIAGNOSIS — I6389 Other cerebral infarction: Secondary | ICD-10-CM

## 2021-01-21 DIAGNOSIS — G8929 Other chronic pain: Secondary | ICD-10-CM

## 2021-01-21 LAB — BASIC METABOLIC PANEL
Anion gap: 5 (ref 5–15)
BUN: 15 mg/dL (ref 6–20)
CO2: 23 mmol/L (ref 22–32)
Calcium: 8.3 mg/dL — ABNORMAL LOW (ref 8.9–10.3)
Chloride: 106 mmol/L (ref 98–111)
Creatinine, Ser: 0.94 mg/dL (ref 0.44–1.00)
GFR, Estimated: >60 mL/min (ref >60)
Glucose, Bld: 91 mg/dL (ref 70–99)
Potassium: 3.7 mmol/L (ref 3.5–5.1)
Sodium: 134 mmol/L — ABNORMAL LOW (ref 135–145)

## 2021-01-21 LAB — CBC
HCT: 32.6 % — ABNORMAL LOW (ref 36.0–46.0)
Hemoglobin: 10.9 g/dL — ABNORMAL LOW (ref 12.0–15.0)
MCH: 30.4 pg (ref 26.0–34.0)
MCHC: 33.4 g/dL (ref 30.0–36.0)
MCV: 91.1 fL (ref 80.0–100.0)
Platelets: 221 10*3/uL (ref 150–400)
RBC: 3.58 MIL/uL — ABNORMAL LOW (ref 3.87–5.11)
RDW: 13.6 % (ref 11.5–15.5)
WBC: 6.1 10*3/uL (ref 4.0–10.5)
nRBC: 0 % (ref 0.0–0.2)

## 2021-01-21 LAB — PHOSPHORUS: Phosphorus: 3.4 mg/dL (ref 2.5–4.6)

## 2021-01-21 LAB — MAGNESIUM: Magnesium: 1.8 mg/dL (ref 1.7–2.4)

## 2021-01-21 LAB — ECHOCARDIOGRAM COMPLETE
AR max vel: 1.78 cm2
AV Area VTI: 1.77 cm2
AV Area mean vel: 1.77 cm2
AV Mean grad: 5 mmHg
AV Peak grad: 10 mmHg
Ao pk vel: 1.58 m/s
Area-P 1/2: 3.31 cm2
Height: 64 in
MV M vel: 5.14 m/s
MV Peak grad: 105.7 mmHg
MV VTI: 1.83 cm2
Radius: 0.5 cm
S' Lateral: 3 cm
Weight: 3280.44 oz

## 2021-01-21 LAB — VITAMIN B12: Vitamin B-12: 310 pg/mL (ref 180–914)

## 2021-01-21 LAB — LIPID PANEL
Cholesterol: 175 mg/dL (ref 0–200)
HDL: 40 mg/dL — ABNORMAL LOW (ref >40)
LDL Cholesterol: 110 mg/dL — ABNORMAL HIGH (ref 0–99)
Total CHOL/HDL Ratio: 4.4 RATIO
Triglycerides: 127 mg/dL (ref <150)
VLDL: 25 mg/dL (ref 0–40)

## 2021-01-21 LAB — HEMOGLOBIN A1C
Hgb A1c MFr Bld: 4.8 % (ref 4.8–5.6)
Mean Plasma Glucose: 91.06 mg/dL

## 2021-01-21 LAB — TSH: TSH: 0.52 u[IU]/mL (ref 0.350–4.500)

## 2021-01-21 IMAGING — CT CT ANGIO HEAD-NECK (W OR W/O PERF)
2 of 11 series · 8 of 33 positions shown · IV contrast (omnipaque)
Comparison: MRI and CT [DATE].

CLINICAL DATA: Stroke follow-up.

EXAM:
CT ANGIOGRAPHY HEAD AND NECK
TECHNIQUE: Multidetector CT imaging of the head and neck was performed using
the standard protocol during bolus administration of intravenous
contrast. Multiplanar CT image reconstructions and MIPs were
obtained to evaluate the vascular anatomy. Carotid stenosis
measurements (when applicable) are obtained utilizing NASCET
criteria, using the distal internal carotid diameter as the
denominator.
CONTRAST:  100mL OMNIPAQUE IOHEXOL 350 MG/ML SOLN

[Series 9: cta neck/head · axial · 0.48mm/px · z∈[-44,+272]mm · 3 of 159 slices shown]
[im 1/159  soft-tissue]
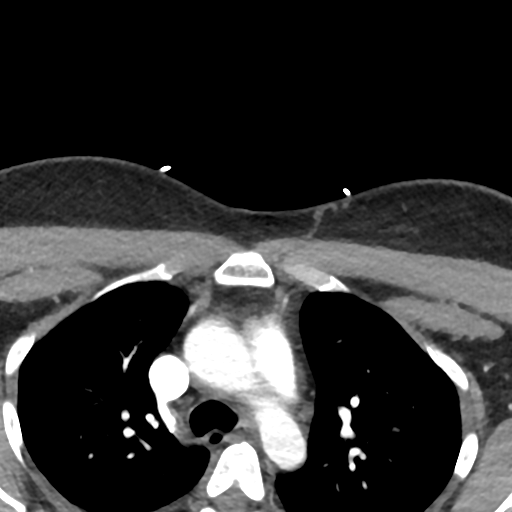
[im 80/159  bone]
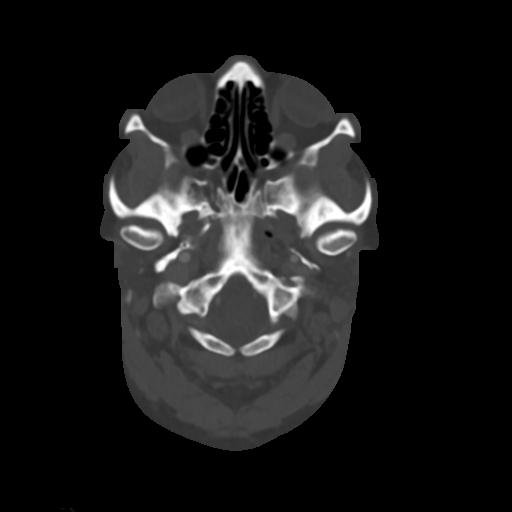
[im 159/159  soft-tissue]
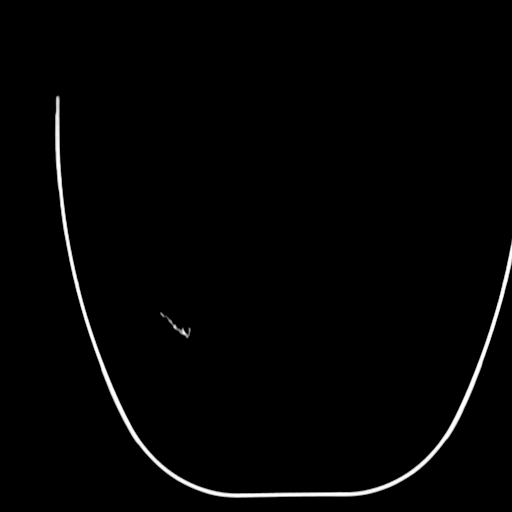

[Series 11: ax thins · axial · 0.39mm/px · z∈[-52,+147]mm · 5 of 333 slices shown]
[im 56/333  soft-tissue]
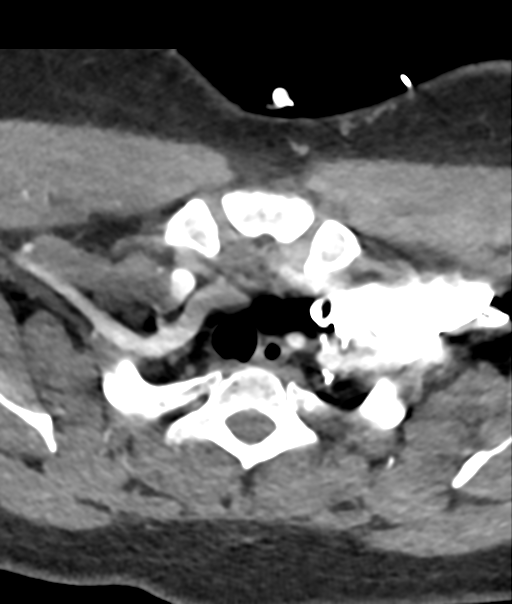
[im 111/333  soft-tissue]
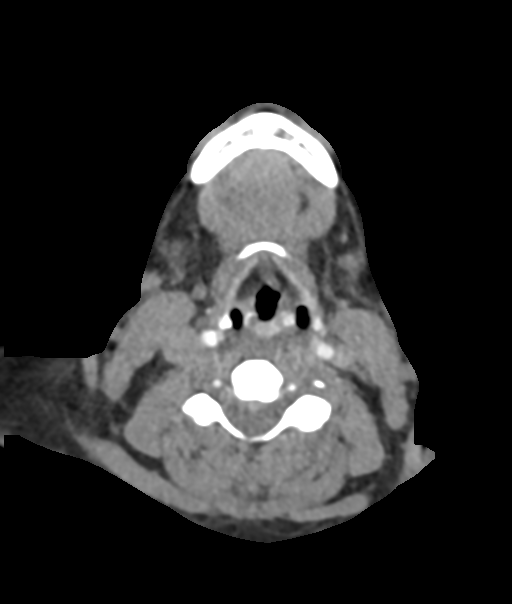
[im 167/333  soft-tissue]
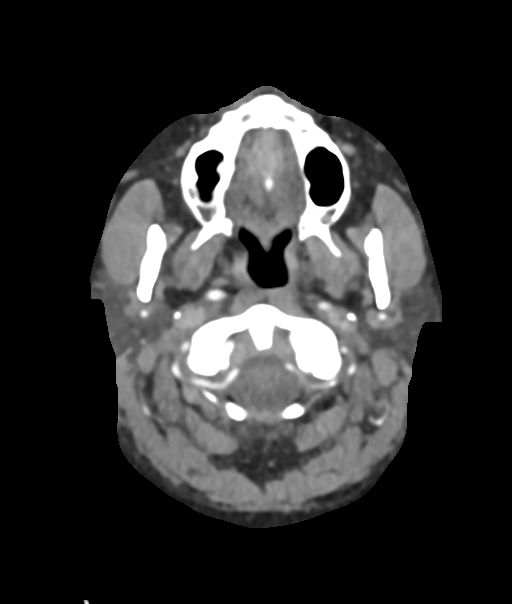
[im 222/333  soft-tissue]
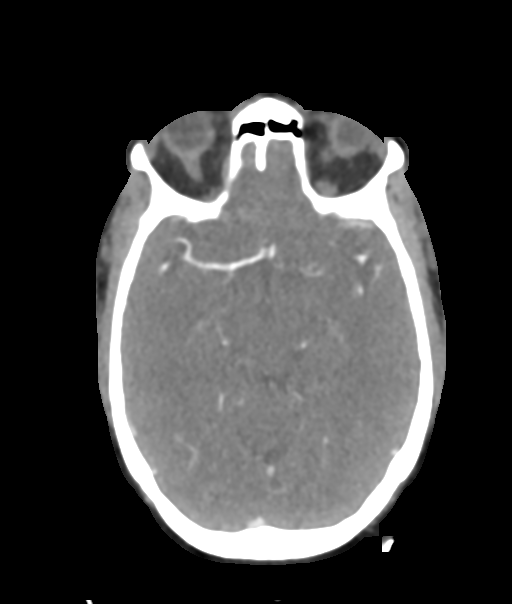
[im 277/333  soft-tissue]
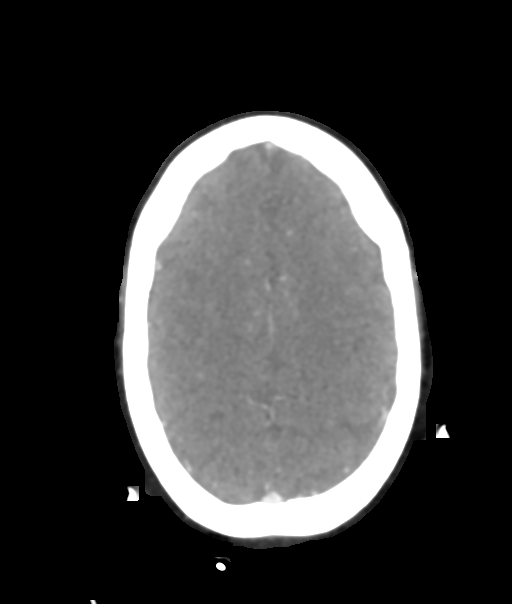

[8 of 33 positions shown; findings below may reference images not displayed]

FINDINGS: CT HEAD FINDINGS

Brain: No evidence of acute large vascular territory infarction,
hemorrhage, hydrocephalus, extra-axial collection or mass
lesion/mass effect. Probable punctate acute right parietal infarct
seen on recent MRI is not visible by CT.

Vascular: See below.

Skull: No acute fracture.

Sinuses: The visualized sinuses are clear.

Orbits: No acute findings in the visualized orbits.

Review of the MIP images confirms the above findings

CTA NECK FINDINGS

Aortic arch: Great vessel origins are patent.

Right carotid system: Motion limited evaluation without visible
hemodynamically significant (greater than 50%). Poorly visualized
carotid bifurcation without definite hemodynamically significant
stenosis.

Left carotid system: Limited evaluation due to patient motion with
suspected mild to moderate stenosis of the left ICA at C1-C2. Poorly
visualized carotid bifurcation without definite hemodynamically
significant stenosis.

Vertebral arteries: Bilateral vertebral arteries are small and
poorly evaluated due to artifact on this study. Nondiagnostic
evaluation of the proximal left vertebral artery due to superimposed
streak artifact from pooled venous contrast. The vertebral arteries
are poorly visualized at C4-C5, which may be in part artifactual
given artifact in this region; however, stenosis is not excluded.

Skeleton: Saturated cervical kyphosis. Mild-to-moderate degenerative
disc disease at C6-C7.

Other neck: No evidence of acute abnormality.

Upper chest: Visualized lung apices are clear.

Review of the MIP images confirms the above findings

CTA HEAD FINDINGS

Anterior circulation: Bilateral intracranial ICAs, MCAs, and ACAs
are patent. Left A1 ACA is hypoplastic, likely chronic/congenital.
Otherwise, no evidence of proximal hemodynamically significant
stenosis. No aneurysm.

Posterior circulation: Small vertebrobasilar system with bilateral
fetal type PCAs, anatomic variant. Moderate right intradural
vertebral artery narrowing. The distal left vertebral artery is very
small and poorly visualized, which could represent anatomic
variation with the left vertebral artery predominantly terminating
as PICA or a superimposed stenosis. Bilateral PCAs are patent
without evidence of proximal hemodynamically significant stenosis.
Limited evaluation of the distal PCAs due to venous contamination.

Venous sinuses: As permitted by contrast timing, patent. Rounded
filling defects in the superior sagittal sinus likely represent
arachnoid granulations.

Anatomic variants: Detailed above.

Review of the MIP images confirms the above findings
IMPRESSION: CT head:

No evidence of acute intracranial abnormality. Probable punctate
acute right parietal infarct seen on recent MRI is not visible by
CT.

CTA Head:

1. Very small vertebrobasilar system with bilateral fetal type PCAs,
anatomic variant. Moderate right intradural vertebral artery
narrowing. The distal left vertebral artery is very small and poorly
visualized, which could represent anatomic variation with the left
vertebral artery predominantly terminating as PICA or a superimposed
stenosis. The basilar artery is small throughout its course.
2. Otherwise, no large vessel occlusion or proximal hemodynamically
significant stenosis.

CTA Neck:

1. Significantly limited evaluation due to patient motion.
2. Suspected mild to moderate stenosis of the left ICA at C1-C2.
3. Vertebral arteries are small poorly evaluated due to their small
size and motion artifact. The vertebral arteries appear poorly
opacified at C4-C5, which may be in part artifactual given artifact
in this region; however, stenosis is not excluded. A repeat CTA
could further characterize if clinically indicated.

## 2021-01-21 MED ORDER — SENNOSIDES-DOCUSATE SODIUM 8.6-50 MG PO TABS
1.0000 | ORAL_TABLET | Freq: Every day | ORAL | Status: DC
  Administered 2021-01-21 – 2021-01-22 (×2): 1 via ORAL
  Filled 2021-01-21 (×2): qty 1

## 2021-01-21 MED ORDER — SENNA-DOCUSATE SODIUM 8.6-50 MG PO TABS: 4.0000 | ORAL_TABLET | Freq: Every day | ORAL | Status: DC

## 2021-01-21 MED ORDER — ATORVASTATIN CALCIUM 40 MG PO TABS
40.0000 mg | ORAL_TABLET | Freq: Every day | ORAL | Status: DC
  Administered 2021-01-21 – 2021-01-23 (×3): 40 mg via ORAL
  Filled 2021-01-21 (×3): qty 1

## 2021-01-21 MED ORDER — IOHEXOL 350 MG/ML SOLN
100.0000 mL | Freq: Once | INTRAVENOUS | Status: AC | PRN
  Administered 2021-01-21: 100 mL via INTRAVENOUS

## 2021-01-21 MED ORDER — OLANZAPINE-FLUOXETINE HCL 6-25 MG PO CAPS
1.0000 | ORAL_CAPSULE | Freq: Every day | ORAL | Status: DC
  Administered 2021-01-21 – 2021-01-22 (×2): 1 via ORAL
  Filled 2021-01-21 (×3): qty 1

## 2021-01-21 MED ORDER — GABAPENTIN 100 MG PO CAPS
200.0000 mg | ORAL_CAPSULE | Freq: Every day | ORAL | Status: DC
  Administered 2021-01-21 – 2021-01-22 (×2): 200 mg via ORAL
  Filled 2021-01-21 (×2): qty 2

## 2021-01-21 MED ORDER — CLOPIDOGREL BISULFATE 75 MG PO TABS
75.0000 mg | ORAL_TABLET | Freq: Every day | ORAL | Status: DC
  Administered 2021-01-21 – 2021-01-23 (×3): 75 mg via ORAL
  Filled 2021-01-21 (×3): qty 1

## 2021-01-21 NOTE — Progress Notes (Signed)
Pt had what the family called an "episode" while working with PT. Pt became weak and unresponsive with a fixed stare straight ahead toward the ground. Vital remained stable. This lasted about 5 minutes. This RN entered the room at about minute 2. Pt was clutching chest and when asked if she was having chest pain, did not respond. When coaxed to move arms and legs, she was moderately engaged with some muscle twinges and slight movement. Family was visibly upset and daughter was crying. Pt continued to stay silent but was able to participate in getting back to bed, as EEG technician arrived. Pt experienced significant weakness of right leg with difficulty initiating movement but engagement of leg muscles once physically prompted. Pt was left in bed, in stable condition but remaining mildly responsive.

## 2021-01-21 NOTE — Progress Notes (Addendum)
STROKE TEAM PROGRESS NOTE   INTERVAL HISTORY Her husband and dtr at the bedside. Pt had one of her "usual" spells per husband. She would not speak to me, but followed all commands and started crying. Slowly she started mumbling and then talking saying she is sorry. EEG tech just arrived to hook up EEG so we can capture these spells.   Vitals:   01/20/21 1645 01/20/21 1932 01/21/21 0000 01/21/21 0430  BP: 125/80 117/71 (!) 96/54 111/60  Pulse: 74 76 84 80  Resp: 17 20 15 15   Temp:  97.7 F (36.5 C)  98.1 F (36.7 C)  TempSrc:  Oral  Oral  SpO2: 100% 100% 96% 99%  Weight:      Height:       CBC:  Recent Labs  Lab 01/20/21 0939 01/20/21 0947 01/21/21 0156  WBC 5.0  --  6.1  NEUTROABS 2.4  --   --   HGB 11.9* 13.3 10.9*  HCT 36.8 39.0 32.6*  MCV 92.2  --  91.1  PLT 214  --  703   Basic Metabolic Panel:  Recent Labs  Lab 01/20/21 0939 01/20/21 0947 01/21/21 0156  NA 136 139 134*  K 4.4 4.4 3.7  CL 104 105 106  CO2 27  --  23  GLUCOSE 91 85 91  BUN 13 14 15   CREATININE 1.07* 1.00 0.94  CALCIUM 8.9  --  8.3*  MG  --   --  1.8  PHOS  --   --  3.4   Lipid Panel:  Recent Labs  Lab 01/21/21 0156  CHOL 175  TRIG 127  HDL 40*  CHOLHDL 4.4  VLDL 25  LDLCALC 110*   HgbA1c:  Recent Labs  Lab 01/21/21 0156  HGBA1C 4.8   Urine Drug Screen: No results for input(s): LABOPIA, COCAINSCRNUR, LABBENZ, AMPHETMU, THCU, LABBARB in the last 168 hours.  Alcohol Level No results for input(s): ETH in the last 168 hours.  IMAGING past 24 hours MR BRAIN WO CONTRAST  Result Date: 01/20/2021 CLINICAL DATA:  Neuro deficit, acute stroke suspected. EXAM: MRI HEAD WITHOUT CONTRAST TECHNIQUE: Multiplanar, multiecho pulse sequences of the brain and surrounding structures were obtained without intravenous contrast. COMPARISON:  Same day CT code stroke.  MRI [id number]. FINDINGS: Brain: Probable punctate acute or early subacute infarct in the right parietal lobe (series 3, image 27). No  substantial edema or mass effect. Remote left greater than right cerebellar lacunar infarcts. Suspected small remote right frontal cortical infarct (series 7, image 19) no hydrocephalus. No acute hemorrhage. No extra-axial fluid collection. No midline shift. Basal cisterns are patent. Mild T2/FLAIR hyperintensities within the frontal predominant white matter. Vascular: Major arterial flow voids are maintained at the skull base. Skull and upper cervical spine: Normal marrow signal. Sinuses/Orbits: Largely clear sinuses.  Unremarkable orbits. Other: No mastoid effusions. IMPRESSION: 1. Probable punctate acute or early subacute infarct in the right parietal lobe (series 3, image 27). No significant edema or mass effect. 2. Remote cerebellar lacunar infarcts and suspected small remote right frontal cortical infarct. 3. Mild T2/FLAIR hyperintensities within the frontal predominant white matter, most likely related to chronic microvascular ischemia or chronic migraines. Electronically Signed   By: Margaretha Sheffield MD   On: 01/20/2021 18:29   DG Chest Portable 1 View  Result Date: 01/20/2021 CLINICAL DATA:  Chest tightness. EXAM: PORTABLE CHEST 1 VIEW COMPARISON:  10/21/2020 and older studies. FINDINGS: Cardiac silhouette is borderline enlarged. No mediastinal or hilar masses. Clear lungs.  No pleural effusion or pneumothorax. Skeletal structures are grossly intact. IMPRESSION: No active disease. Electronically Signed   By: Lajean Manes M.D.   On: 01/20/2021 10:55   CT HEAD CODE STROKE WO CONTRAST  Result Date: 01/20/2021 CLINICAL DATA:  Code stroke. Right-sided weakness, headache, and aphasia. EXAM: CT HEAD WITHOUT CONTRAST TECHNIQUE: Contiguous axial images were obtained from the base of the skull through the vertex without intravenous contrast. COMPARISON:  Head CT and MRI 08/18/2019 FINDINGS: Brain: There is no evidence of an acute infarct, intracranial hemorrhage, mass, midline shift, or extra-axial fluid  collection. A small chronic infarct is again noted in the right middle frontal gyrus, and there are small chronic cerebellar infarcts. The ventricles are normal in size. Vascular: No hyperdense vessel. Skull: No fracture or suspicious osseous lesion. Sinuses/Orbits: Visualized paranasal sinuses and mastoid air cells are clear. Rightward gaze. Other: None. ASPECTS [facility] Stroke Program Early CT Score) - Ganglionic level infarction (caudate, lentiform nuclei, internal capsule, insula, M1-M3 cortex): 7 - Supraganglionic infarction (M4-M6 cortex): 3 Total score (0-10 with 10 being normal): 10 IMPRESSION: 1. No evidence of acute intracranial abnormality. 2. ASPECTS is 10. 3. 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 These results were communicated to Dr. Lorrin Goodell at 9:54 am on 01/20/2021 by text page via the [facility] messaging system. Electronically Signed   By: Logan Bores M.D.   On: 01/20/2021 09:53    PHYSICAL EXAM General: Appears well-developed; tearful, depressed, anxious. Psych: Affect appropriate to situation Eyes: No scleral injection HENT: No OP obstrucion Head: Normocephalic.  Cardiovascular: Normal rate and regular rhythm.  Respiratory: Effort normal and breath sounds normal to anterior ascultation GI: Soft.  No distension. There is no tenderness.  Skin: WDI    Neurological Examination Mental Status: Shortly after her dramatic spell of not speaking and crying while remaining alert, she slowly started to mumble and then speak in a very weak soft voice. She is oriented.  Speech fluent without evidence of aphasia. Able to follow 3 step commands without difficulty. Cranial Nerves: II: Visual fields grossly normal,  III,IV, VI: ptosis not present, extra-ocular motions intact bilaterally, pupils equal, round, reactive to light and accommodation V,VII: smile symmetric, facial light touch sensation normal bilaterally VIII: hearing normal bilaterally IX,X: uvula rises  symmetrically XI: bilateral shoulder shrug XII: midline tongue extension Motor: Right : Upper extremity   5/5    Left:     Upper extremity   5/5  Lower extremity   5/5     Lower extremity   5/5 Tone and bulk:normal tone throughout; no atrophy noted- but moves every limb with tremulousness during spell and slower tremors with movement after the spell with great effort Sensory:  light touch intact throughout, bilaterally Deep Tendon Reflexes: 2+ and symmetric throughout Plantars: Right: downgoing   Left: downgoing Cerebellar: normal finger-to-nose, normal rapid alternating movements and normal heel-to-shin test Gait: normal gait and station   ASSESSMENT/PLAN Ms. Keller Mikels is a 50 y.o. female with history of prior strokes and HLD who presented as a code stroke today. CTH negative for acute finding. No further testing was performed in CT suite, due to high suspicion of functional behavior. Her exam was not always consistent with her complaints. Due to past history of stroke, MRI brain ordered, which showed a tiny right parietal stroke, which is incidental and does not correlate with any of her presenting symptoms. tPA was not offered due to suspicion of functional presentation and low NIH. No LVO.   ? 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  Overnight 6/24 and this am during my visit she had spells of not responding; family says this happens at least twice a day.  LTM pending to capture spells  Stroke, incidental finding: Rt punctate parietotemporal infarct not c/w presenting symptoms.  Code Stroke CT head No acute abnormality. ASPECTS 10.    MRI  punctate right parietal infarct, chronic small vessel disease CTA pending 2D Echo pending LDL 110 HgbA1c 4.8 VTE prophylaxis - lovenox aspirin 81 mg daily prior to admission, now on aspirin 81 mg daily and clopidogrel 75 mg daily  for 3 weeks and then plavix alone.  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 Pt was seen and examined.   50 year old female with history of anxiety, depression, PNES, stroke admitted for spells concerning for functional presentation.  Overnight and this morning also had 2-3 episodes, currently on long-term EEG to capture events.  In 11/2014 MRI showed multiple small left greater than right old cerebellar infarcts.  It also showed diminutive posterior circulation likely on congenital basis.  This admission, MRI also found incidental right parietal temporal punctate juxtacortical infarct, etiology unclear, however concerning for small vessel disease given risk factors of obesity and hyperlipidemia.  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  EF 60 to 65%, A1c 4.8, LDL 110, UDS pending.  Given younger age, will also check LE venous Doppler, carotid Doppler, hypercoagulable work-up.  On exam, husband at bedside, patient awake alert, mildly lethargic, however no aphasia, follows some commands, able to name  and repeat, registration 3/3, delayed recall 3/3, able to backward spelling world, serial 7 all correct answer, no apraxia, 3/5 previous president.  Otherwise neurologically intact, no focal deficit.  Currently on DAPT for 3 weeks and then Plavix alone.  Put on Lipitor 40 and continue on discharge.  Continue LTM EEG monitoring with hope to capture event. Will follow.  For detailed assessment and plan, please refer to above as I have made changes wherever appropriate.   Rosalin Hawking, MD PhD Stroke Neurology 01/21/2021 6:57 PM  I spent  35 minutes in total face-to-face time with the patient, more than 50% of which was spent in counseling and coordination of care, reviewing test results, images and medication, and discussing the diagnosis, treatment plan and potential prognosis. This patient's care requiresreview of multiple databases, neurological assessment, discussion with family, other specialists and medical decision making of high complexity.  I discussed with Dr. Ree Kida. I had long discussion with patient and her husband at bedside, updated pt current condition, treatment  plan and potential prognosis, and answered all the questions.  They expressed understanding and appreciation.        To contact Stroke Continuity provider, please refer to [email or web address] After hours, contact General Neurology

## 2021-01-21 NOTE — Progress Notes (Signed)
vLTM EEG started. Educated family member push button, Notified neuro

## 2021-01-21 NOTE — Progress Notes (Signed)
PROGRESS NOTE    Desiree Russell  OEU:[id number] DOB: 1971/01/21 DOA: 01/20/2021 PCP: Vevelyn Francois, NP   Brief Narrative:  HPI on 01/20/2021 by Dr. Irene Pap Desiree Russell is a 50 y.o. female with medical history significant for previous CVA, hypertension, chronic anxiety/depression, fibromyalgia, PTSD, hyperlipidemia, positive COVID-19 screening test 2 months ago, pseudoseizures, who presented to [facility] ED via EMS as a code stroke.  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  Reported waking up this morning with a headache throbbing like a band around her head.  Due to concern for an acute stroke, patient was taken emergently at the CT suite.  CT head showed no evidence of acute abnormality however it showed small chronic right frontal and cerebellar infarcts.  Patient was last known well around 8:15 AM.  She was not given tPA, per neurology not a candidate.  Patient was seen by the neurology/stroke team with recommendation for MRI brain without contrast and to continue antiplatelet aspirin 81 mg daily.  MRI brain revealed probable punctate acute or early subacute infarct in the right parietal lobe.  Neurology recommended admission by hospitalist team for stroke work-up.  Interim history Admitted with Acute CVA and possible seizure. Pending work up. PT recommending CIR.  Assessment & Plan   Acute CVA    -?  Incidental finding -Patient tells me she has been having right sided weakness, chronic falls and dizziness -MRI brain showed acute or early subacute infarct in the right parietal lobe.  No significant edema or mass-effect.  Remote cerebellar lacunar infarcts, suspected small remote right frontal cortical infarct. Mild T2/FLAIR hyperintensities within the frontal predominant white matter likely  related to chronic microvascular ischemia chronic migraines -Echocardiogram shows an EF 60 to 65%, LVH, no regional wall motion abnormalities. -LDL 110, hemoglobin A1c 4.8 -Neurology consulted and appreciated -Not entirely convinced that patient's complaints are due to CVA.  Question seizure activity, EEG pending -Pending orthostatic vitals, vestibular PT -Currently on aspirin, statin, Plavix -PT recommending CIR  Essential hypertension -Given CVA, allowing for permissive hypertension  Chronic depression/anxiety -Continue Cymbalta, Symbyax, Ativan as needed  Chronic headache -Continue Ubrelvy as needed  GERD -Continue PPI  Obesity -BMI 35.19 -Patient follow-up with PCP to discuss lifestyle modifications  History of COVID-19 -stable  DVT Prophylaxis Lovenox  Code Status: Full  Family Communication: Daughter at bedside  Disposition Plan:  Status is: Inpatient  Remains inpatient appropriate because:Ongoing diagnostic testing needed not appropriate for outpatient work up  Dispo:  Patient From: Home  Planned Disposition: Home  Medically stable for discharge: No     Consultants Neurology  Procedures  Echocardiogram  Antibiotics   Anti-infectives (From admission, onward)    Start     Dose/Rate Route Frequency Ordered Stop   01/21/21 1000  valACYclovir (VALTREX) tablet 500 mg        500 mg Oral Daily 01/20/21 2050         Subjective:   [name] seen and examined today, eating breakfast.  Continues to have some weakness.  States she also has leg pain as well as back pain.  Denies current chest pain or shortness of breath, abdominal pain, nausea or vomiting.  Complains of current headache.   Objective:   Vitals:   01/20/21 1932 01/21/21 0000 01/21/21  0430 01/21/21 0742  BP: 117/71 (!) 96/54 111/60 133/90  Pulse: 76 84 80 85  Resp: 20 15 15 15   Temp: 97.7 F (36.5 C)  98.1 F (36.7 C) 98.1 F (36.7 C)  TempSrc: Oral  Oral Oral  SpO2: 100% 96% 99%  99%  Weight:      Height:       No intake or output data in the 24 hours ending 01/21/21 1221 Filed Weights   01/20/21 0958  Weight: 93 kg    Exam General: Well developed, well nourished, NAD, appears stated age HEENT: NCAT, PERRLA, EOMI, Anicteic Sclera, mucous membranes moist.  Cardiovascular: S1 S2 auscultated, RRR, no murmur Respiratory: Clear to auscultation bilaterally  Abdomen: Soft, nontender, nondistended, + bowel sounds Extremities: warm dry without cyanosis clubbing or edema Neuro: AAOx3, nonfocal, mildly decreased strength in RLE Skin: Without rashes exudates or nodules Psych: appropriate mood and affect   Data Reviewed: I have personally reviewed following labs and imaging studies  CBC: Recent Labs  Lab 01/20/21 0939 01/20/21 0947 01/21/21 0156  WBC 5.0  --  6.1  NEUTROABS 2.4  --   --   HGB 11.9* 13.3 10.9*  HCT 36.8 39.0 32.6*  MCV 92.2  --  91.1  PLT 214  --  950   Basic Metabolic Panel: Recent Labs  Lab 01/20/21 0939 01/20/21 0947 01/21/21 0156  NA 136 139 134*  K 4.4 4.4 3.7  CL 104 105 106  CO2 27  --  23  GLUCOSE 91 85 91  BUN 13 14 15   CREATININE 1.07* 1.00 0.94  CALCIUM 8.9  --  8.3*  MG  --   --  1.8  PHOS  --   --  3.4   GFR: Estimated Creatinine Clearance: 81.8 mL/min (by C-G formula based on SCr of 0.94 mg/dL). Liver Function Tests: Recent Labs  Lab 01/20/21 0939  AST 19  ALT 15  ALKPHOS 39  BILITOT 0.4  PROT 6.5  ALBUMIN 3.5   No results for input(s): LIPASE, AMYLASE in the last 168 hours. No results for input(s): AMMONIA in the last 168 hours. Coagulation Profile: Recent Labs  Lab 01/20/21 0939  INR 1.0   Cardiac Enzymes: No results for input(s): CKTOTAL, CKMB, CKMBINDEX, TROPONINI in the last 168 hours. BNP (last 3 results) No results for input(s): PROBNP in the last 8760 hours. HbA1C: Recent Labs    01/21/21 0156  HGBA1C 4.8   CBG: Recent Labs  Lab 01/20/21 0939 01/20/21 2156  GLUCAP 85 92    Lipid Profile: Recent Labs    01/21/21 0156  CHOL 175  HDL 40*  LDLCALC 110*  TRIG 127  CHOLHDL 4.4   Thyroid Function Tests: No results for input(s): TSH, T4TOTAL, FREET4, T3FREE, THYROIDAB in the last 72 hours. Anemia Panel: No results for input(s): VITAMINB12, FOLATE, FERRITIN, TIBC, IRON, RETICCTPCT in the last 72 hours. Urine analysis:    Component Value Date/Time   BILIRUBINUR neg 01/11/2021 0839   PROTEINUR Negative 01/11/2021 0839   UROBILINOGEN 0.2 01/11/2021 0839   NITRITE neg 01/11/2021 0839   LEUKOCYTESUR Negative 01/11/2021 0839   Sepsis Labs: @LABRCNTIP (procalcitonin:4,lacticidven:4)  )No results found for this or any previous visit (from the past 240 hour(s)).    Radiology Studies: MR BRAIN WO CONTRAST  Result Date: 01/20/2021 CLINICAL DATA:  Neuro deficit, acute stroke suspected. EXAM: MRI HEAD WITHOUT CONTRAST TECHNIQUE: Multiplanar, multiecho pulse sequences of the brain and surrounding structures were obtained without intravenous contrast. COMPARISON:  Same day  CT code stroke.  MRI [id number]. FINDINGS: Brain: Probable punctate acute or early subacute infarct in the right parietal lobe (series 3, image 27). No substantial edema or mass effect. Remote left greater than right cerebellar lacunar infarcts. Suspected small remote right frontal cortical infarct (series 7, image 19) no hydrocephalus. No acute hemorrhage. No extra-axial fluid collection. No midline shift. Basal cisterns are patent. Mild T2/FLAIR hyperintensities within the frontal predominant white matter. Vascular: Major arterial flow voids are maintained at the skull base. Skull and upper cervical spine: Normal marrow signal. Sinuses/Orbits: Largely clear sinuses.  Unremarkable orbits. Other: No mastoid effusions. IMPRESSION: 1. Probable punctate acute or early subacute infarct in the right parietal lobe (series 3, image 27). No significant edema or mass effect. 2. Remote cerebellar lacunar infarcts and  suspected small remote right frontal cortical infarct. 3. Mild T2/FLAIR hyperintensities within the frontal predominant white matter, most likely related to chronic microvascular ischemia or chronic migraines. Electronically Signed   By: Margaretha Sheffield MD   On: 01/20/2021 18:29   DG Chest Portable 1 View  Result Date: 01/20/2021 CLINICAL DATA:  Chest tightness. EXAM: PORTABLE CHEST 1 VIEW COMPARISON:  10/21/2020 and older studies. FINDINGS: Cardiac silhouette is borderline enlarged. No mediastinal or hilar masses. Clear lungs.  No pleural effusion or pneumothorax. Skeletal structures are grossly intact. IMPRESSION: No active disease. Electronically Signed   By: Lajean Manes M.D.   On: 01/20/2021 10:55   CT HEAD CODE STROKE WO CONTRAST  Result Date: 01/20/2021 CLINICAL DATA:  Code stroke. Right-sided weakness, headache, and aphasia. EXAM: CT HEAD WITHOUT CONTRAST TECHNIQUE: Contiguous axial images were obtained from the base of the skull through the vertex without intravenous contrast. COMPARISON:  Head CT and MRI 08/18/2019 FINDINGS: Brain: There is no evidence of an acute infarct, intracranial hemorrhage, mass, midline shift, or extra-axial fluid collection. A small chronic infarct is again noted in the right middle frontal gyrus, and there are small chronic cerebellar infarcts. The ventricles are normal in size. Vascular: No hyperdense vessel. Skull: No fracture or suspicious osseous lesion. Sinuses/Orbits: Visualized paranasal sinuses and mastoid air cells are clear. Rightward gaze. Other: None. ASPECTS [facility] Stroke Program Early CT Score) - Ganglionic level infarction (caudate, lentiform nuclei, internal capsule, insula, M1-M3 cortex): 7 - Supraganglionic infarction (M4-M6 cortex): 3 Total score (0-10 with 10 being normal): 10 IMPRESSION: 1. No evidence of acute intracranial abnormality. 2. ASPECTS is 10. 3. 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 These results were communicated to  Dr. Lorrin Goodell at 9:54 am on 01/20/2021 by text page via the [facility] messaging system. Electronically Signed   By: Logan Bores M.D.   On: 01/20/2021 09:53     Scheduled Meds:  aspirin EC  81 mg Oral Daily   atorvastatin  40 mg Oral Daily   clopidogrel  75 mg Oral Daily   DULoxetine  30 mg Oral BID   enoxaparin (LOVENOX) injection  40 mg Subcutaneous Q24H   gabapentin  200 mg Oral QHS   OLANZapine-FLUoxetine  1 capsule Oral QHS   pantoprazole  40 mg Oral Daily   senna-docusate  1 tablet Oral QHS   sodium chloride flush  3 mL Intravenous Once   valACYclovir  500 mg Oral Daily   Continuous Infusions:  lactated ringers 50 mL/hr at 01/21/21 0137     LOS: 1 day   Time Spent in minutes   45 minutes  Koury Roddy D.O. on 01/21/2021 at 12:21 PM  Between 7am to 7pm - Please see  pager noted on [email or web address]  After 7pm go to [email or web address]  And look for the night coverage person covering for me after hours  [facility] Office  [phone]

## 2021-01-21 NOTE — Progress Notes (Signed)
OT Cancellation Note  Patient Details Name: Saysha Menta MRN: [id number] DOB: 1971/06/20   Cancelled Treatment:    Reason Eval/Treat Not Completed: Patient at procedure or test/ unavailable.  EEG being placed, OT will check back as time allows.    India Jolin D Heidi Lemay 01/21/2021, 1:18 PM

## 2021-01-21 NOTE — Evaluation (Signed)
Physical Therapy Evaluation Patient Details Name: Desiree Russell MRN: [id number] DOB: 02-25-1971 Today's Date: 01/21/2021   History of Present Illness  50yo female admitted 01/20/21 via EMS as code stroke due to having episode of R weakness/L facial droop at work. Had an unresponsive episode with admitting MD. Admitted for work up of punctuate acute or early subacute R parietal CVA and seizure rule out. PMH anxiety, HLD, fibromyalgia, seizure, TIA  Clinical Impression   Patient received in recliner, very pleasant and cooperative with PT. Did need fairly constant MinA for ambulation in room with IV pole and HHA and tends to reach out for objects around her for support. Continues to have R sided weakness as well as increased difficulty with RAM activities with the left upper extremity. Only able to perform very limited vestibular testing due to onset of unresponsive episode, however testing is consistent with severe central impairment as she is unable to maintain her eyes on a target even with simple activities such as tracking a target or head turns. After performing some vestibular tests, PT turned away to ask husband a question, turned back to patient and she had begun to have another unresponsive episode. Immediately called from help from RN, who arrived and who I assisted in getting patient back into the bed. Left in bed with EEG tech present and attending. Discussed case with RN and also messaged attending MD regarding events of session. Currently feel she would benefit from intensive therapies in CIR setting moving forward.     Follow Up Recommendations CIR;Supervision for mobility/OOB    Equipment Recommendations  Rolling walker with 5" wheels    Recommendations for Other Services       Precautions / Restrictions Precautions Precautions: Fall;Other (comment) Precaution Comments: can have unresponsive episodes which are unpredictable, R weakness Restrictions Weight Bearing Restrictions: No       Mobility  Bed Mobility Overal bed mobility: Needs Assistance Bed Mobility: Sit to Supine       Sit to supine: Mod assist;+2 for physical assistance;HOB elevated   General bed mobility comments: ModAx2 for management of hips and trunk for return to supine    Transfers Overall transfer level: Needs assistance Equipment used: 2 person hand held assist Transfers: Sit to/from [facility] Sit to Stand: Min assist;+2 physical assistance Stand pivot transfers: Mod assist;+2 physical assistance       General transfer comment: during session before episode of non-responsiveness happened- able to stand with MinAx1 person for balance. After episode of non-responsiveness happened, needed ModAx2 for sit to stand and pivot back over to bed  Ambulation/Gait Ambulation/Gait assistance: Min assist Gait Distance (Feet): 24 Feet (43ftx2) Assistive device: 1 person hand held assist;IV Pole Gait Pattern/deviations: Step-through pattern;Narrow base of support;Trunk flexed;Trendelenburg;Drifts right/left Gait velocity: decreased   General Gait Details: unsteady even with one UE supported on IV pole/HHA from PT on other side. Occasional scissoring and tends to reach out for objects in room. Significant BLE weakness  Stairs            Wheelchair Mobility    Modified Rankin (Stroke Patients Only)       Balance Overall balance assessment: History of Falls;Needs assistance Sitting-balance support: Feet supported Sitting balance-Leahy Scale: Good Sitting balance - Comments: able to get shoes on   Standing balance support: Bilateral upper extremity supported;During functional activity Standing balance-Leahy Scale: Poor Standing balance comment: MinA for balance even with BUE support  Pertinent Vitals/Pain Pain Assessment: Faces Faces Pain Scale: No hurt Pain Intervention(s): Limited activity within patient's  tolerance;Monitored during session    [place] expects to be discharged to:: Private residence Living Arrangements: Spouse/significant other;Children Available Help at Discharge: Family;Available PRN/intermittently Type of Home: House Home Access: Level entry     Home Layout: Two level;Bed/bath upstairs Home Equipment: Grab bars - tub/shower;Shower seat;Wheelchair - manual;Cane - quad Additional Comments: has lots of falls recently- legs give out. has weak ankles and hx of dizziness    Prior Function Level of Independence: Independent         Comments: works as Therapist, sports in infusion clinic     Journalist, newspaper        Extremity/Trunk Assessment   Upper Extremity Assessment Upper Extremity Assessment: Defer to OT evaluation    Lower Extremity Assessment Lower Extremity Assessment: RLE deficits/detail;LLE deficits/detail RLE Deficits / Details: ankle DF 3/5, quad 3-/5, hip flexor 3-/5 RLE Sensation: decreased proprioception RLE Coordination: decreased gross motor LLE Deficits / Details: ankle DF 4/5, quad 4/5, hip flexor 3/5 LLE Sensation: WNL LLE Coordination: WNL    Cervical / Trunk Assessment Cervical / Trunk Assessment: Normal  Communication   Communication: No difficulties  Cognition Arousal/Alertness: Awake/alert Behavior During Therapy: Flat affect Overall Cognitive Status: Within Functional Limits for tasks assessed                                 General Comments: WNL and cooperative until episode of unresponsiveness hit. Does report intermittent episodes of cognitive issues where she cannot remember how to use GPS to get home or how to get home without it and has to call family for help      General Comments      Exercises     Assessment/Plan    PT Assessment Patient needs continued PT services  PT Problem List Decreased strength;Decreased cognition;Decreased knowledge of use of DME;Decreased activity tolerance;Decreased  safety awareness;Decreased balance;Decreased mobility;Decreased coordination       PT Treatment Interventions DME instruction;Balance training;Gait training;Stair training;Cognitive remediation;Functional mobility training;Therapeutic activities;Therapeutic exercise;Patient/family education    PT Goals (Current goals can be found in the Care Plan section)  Acute Rehab PT Goals Patient Stated Goal: figure out what is causing all this PT Goal Formulation: With patient/family Time For Goal Achievement: 02/04/21 Potential to Achieve Goals: Good    Frequency Min 3X/week   Barriers to discharge        Co-evaluation               AM-PAC PT "6 Clicks" Mobility  Outcome Measure Help needed turning from your back to your side while in a flat bed without using bedrails?: A Little Help needed moving from lying on your back to sitting on the side of a flat bed without using bedrails?: A Little Help needed moving to and from a bed to a chair (including a wheelchair)?: A Lot Help needed standing up from a chair using your arms (e.g., wheelchair or bedside chair)?: A Lot Help needed to walk in hospital room?: A Lot Help needed climbing 3-5 steps with a railing? : Total 6 Click Score: 13    End of Session   Activity Tolerance: Patient tolerated treatment well Patient left: in bed;with call bell/phone within reach;Other (comment) (EEG tech present and attending) Nurse Communication: Mobility status PT Visit Diagnosis: Unsteadiness on feet (R26.81);Difficulty in walking, not elsewhere classified (R26.2);Other symptoms and signs  involving the nervous system (R29.898);Repeated falls (R29.6)    Time: 3832-9191 PT Time Calculation (min) (ACUTE ONLY): 38 min   Charges:   PT Evaluation $PT Eval High Complexity: 1 High PT Treatments $Gait Training: 8-22 mins $Therapeutic Activity: 8-22 mins       Windell Norfolk, DPT, PN1   Supplemental Physical Therapist [place]    Pager  [phone] Acute Rehab Office [phone]

## 2021-01-21 NOTE — Progress Notes (Signed)
  Echocardiogram 2D Echocardiogram has been performed.  Desiree Russell 01/21/2021, 12:20 PM

## 2021-01-22 ENCOUNTER — Encounter (HOSPITAL_COMMUNITY): Payer: 59

## 2021-01-22 ENCOUNTER — Inpatient Hospital Stay (HOSPITAL_COMMUNITY): Payer: 59

## 2021-01-22 DIAGNOSIS — F060 Psychotic disorder with hallucinations due to known physiological condition: Secondary | ICD-10-CM

## 2021-01-22 LAB — RAPID URINE DRUG SCREEN, HOSP PERFORMED
Amphetamines: NOT DETECTED
Barbiturates: NOT DETECTED
Benzodiazepines: NOT DETECTED
Cocaine: NOT DETECTED
Opiates: NOT DETECTED
Tetrahydrocannabinol: NOT DETECTED

## 2021-01-22 LAB — RESP PANEL BY RT-PCR (FLU A&B, COVID) ARPGX2
Influenza A by PCR: NEGATIVE
Influenza B by PCR: NEGATIVE
SARS Coronavirus 2 by RT PCR: NEGATIVE

## 2021-01-22 LAB — RPR: RPR Ser Ql: NONREACTIVE

## 2021-01-22 IMAGING — DX DG SHOULDER 2+V PORT*R*
2 series · 2 of 2 positions shown · non-contrast
Comparison: None.

CLINICAL DATA: Fall, right shoulder pain

EXAM:
PORTABLE RIGHT SHOULDER

[shoulder ap]
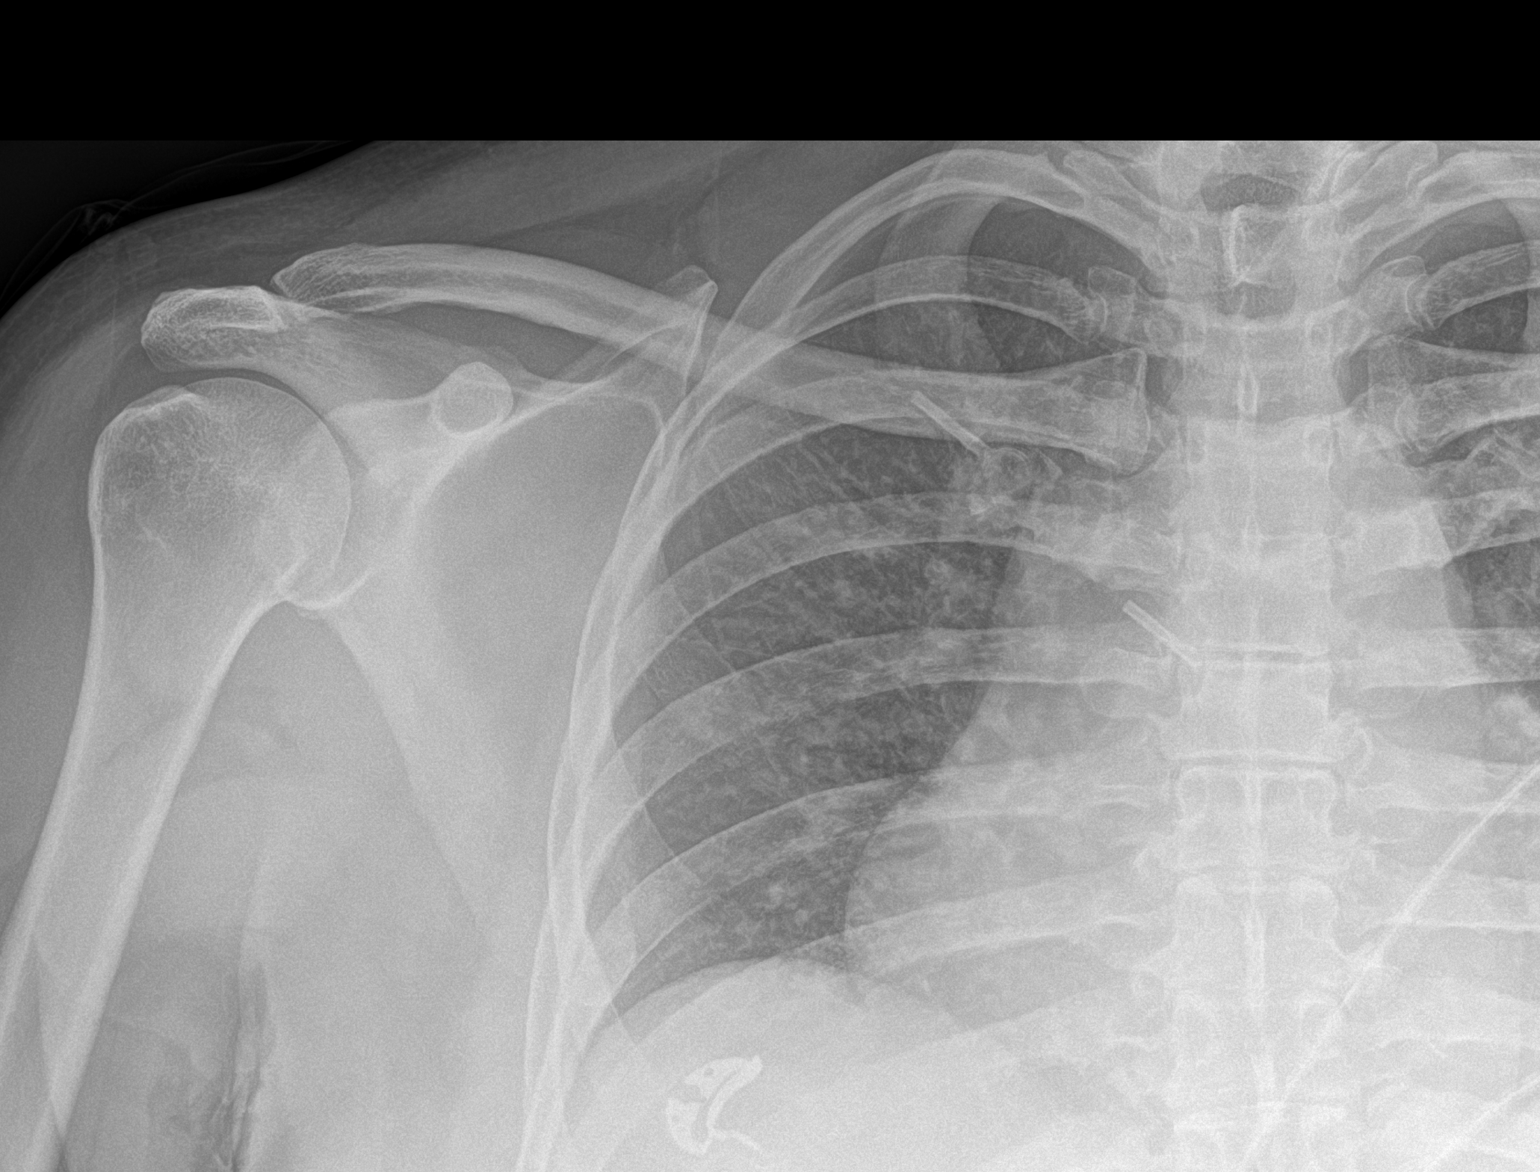

[shoulder obl]
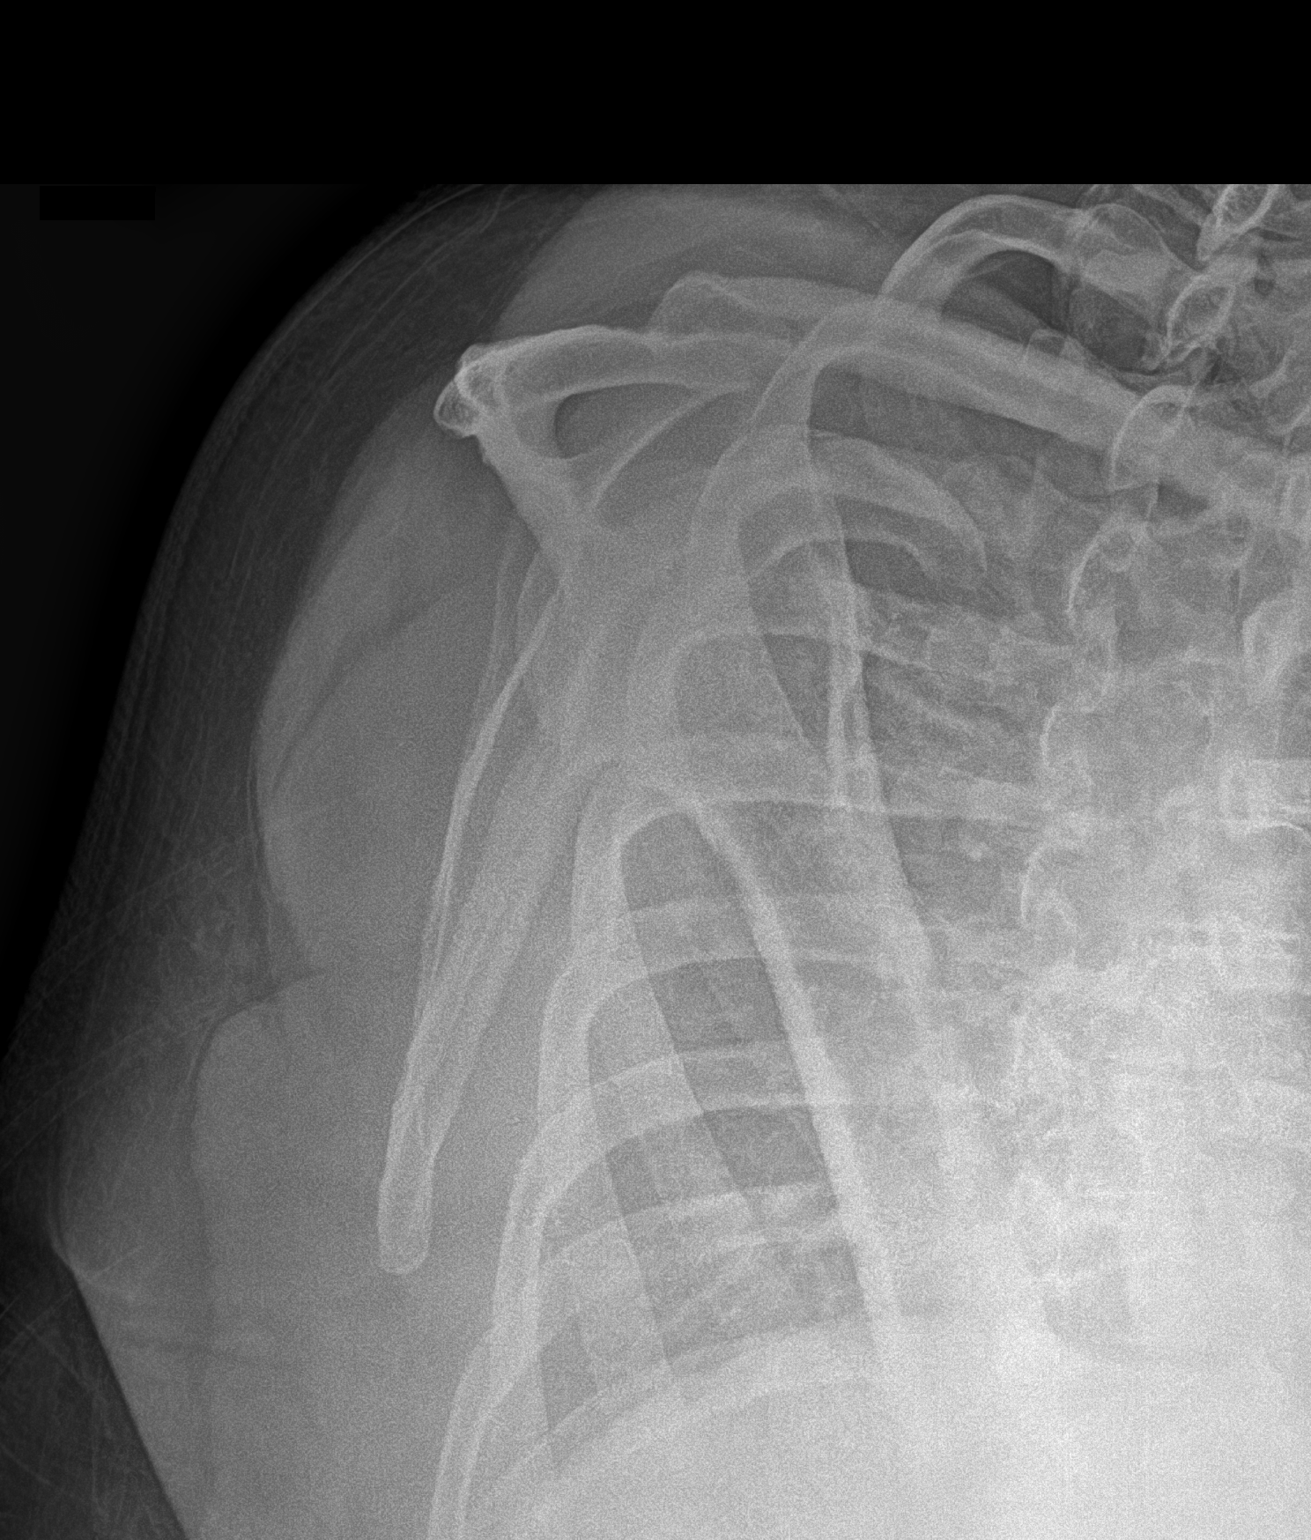

[2 of 2 positions shown; findings below may reference images not displayed]

FINDINGS: There is no evidence of fracture or dislocation. There is no
evidence of arthropathy or other focal bone abnormality. Soft
tissues are unremarkable.
IMPRESSION: Negative.

## 2021-01-22 IMAGING — CR DG HIP (WITH OR WITHOUT PELVIS) 2-3V*R*
3 series · 3 of 3 positions shown · non-contrast
Comparison: None.

CLINICAL DATA: Fall, lateral right hip pain

EXAM:
DG HIP (WITH OR WITHOUT PELVIS) 2-3V RIGHT

[pelvis ap]
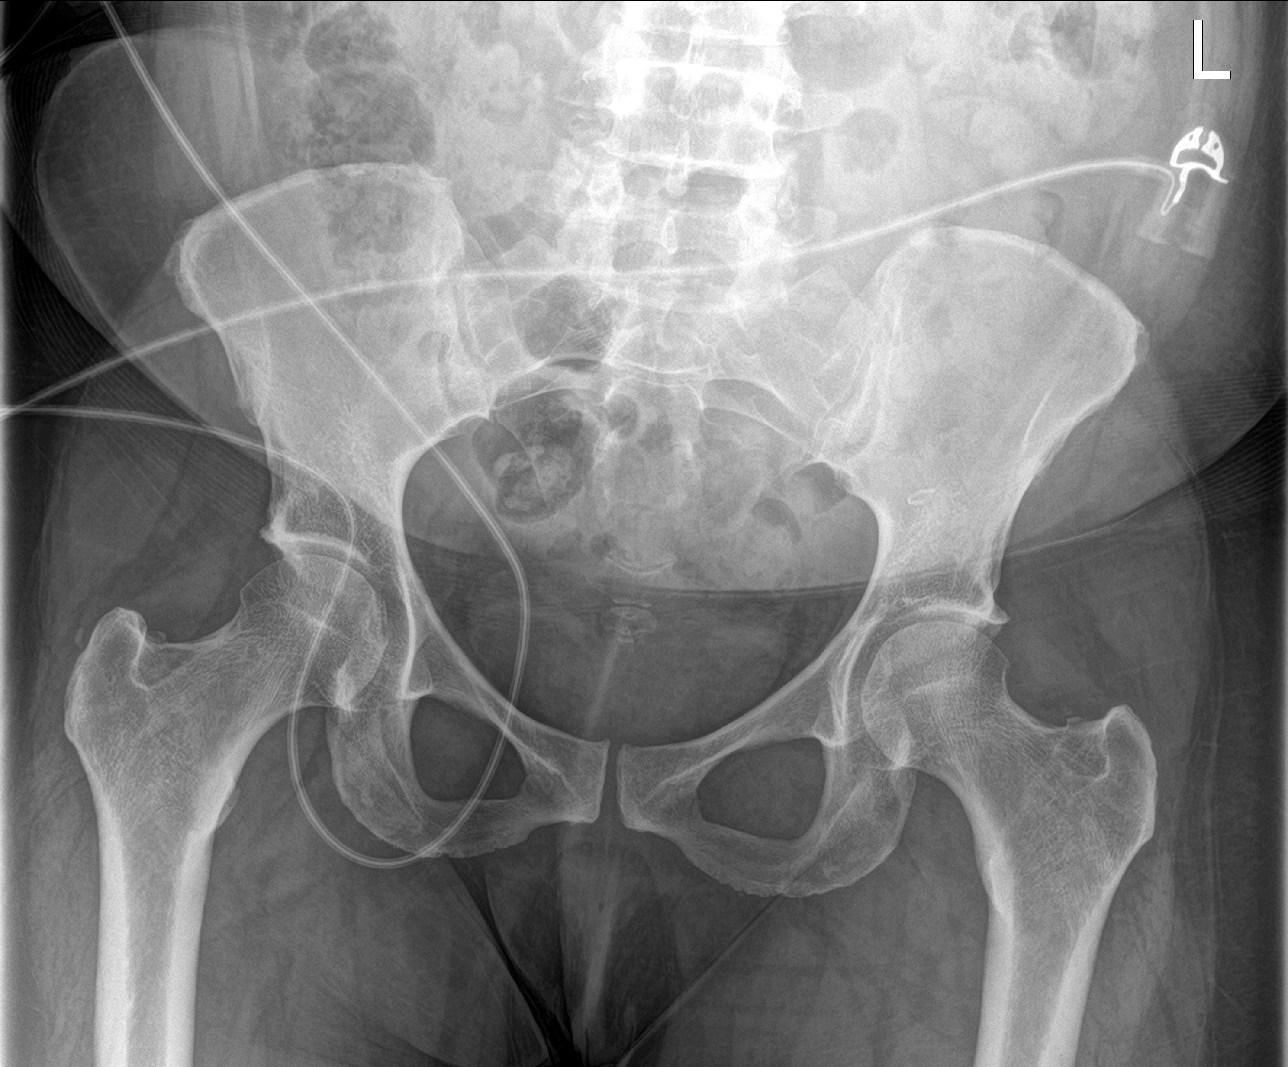

[hip ap]
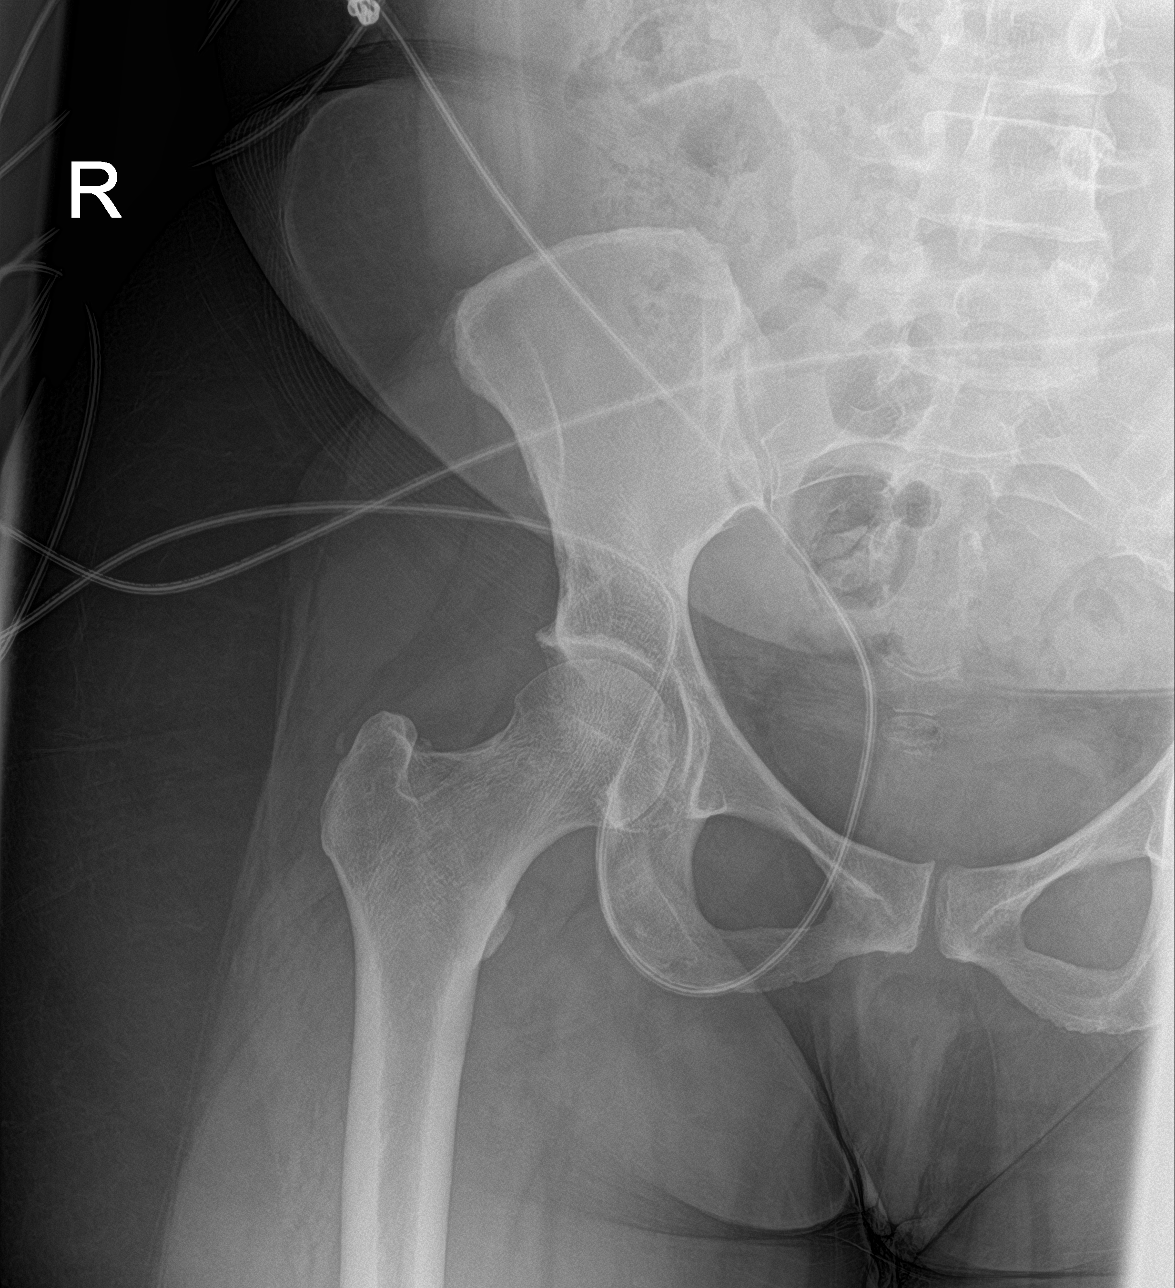

[hip lat]
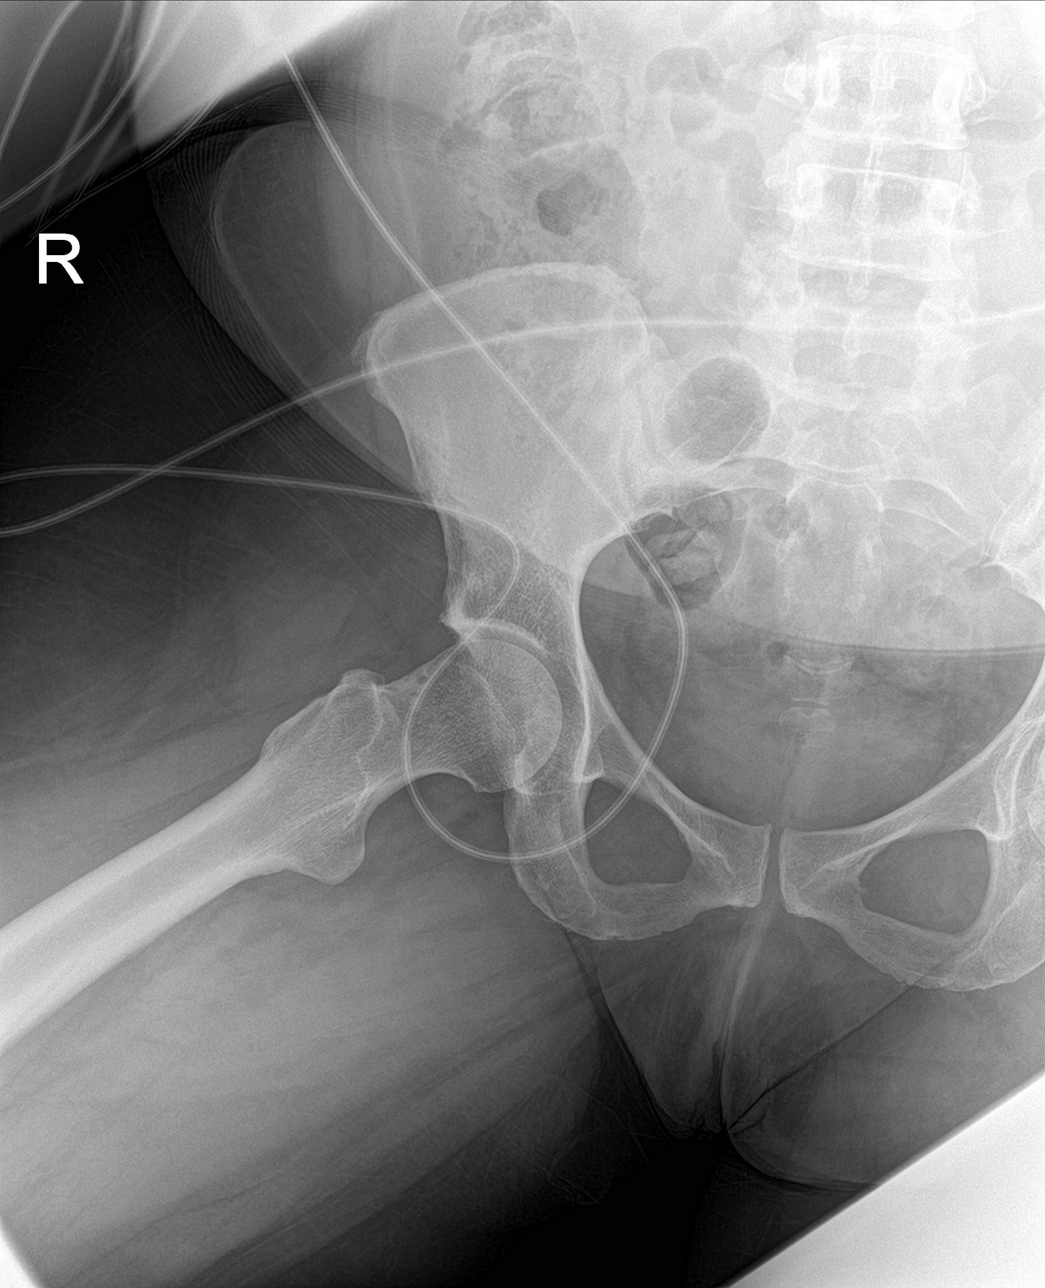

[3 of 3 positions shown; findings below may reference images not displayed]

FINDINGS: Hip joints and SI joints symmetric. No acute bony abnormality.
Specifically, no fracture, subluxation, or dislocation.
IMPRESSION: No acute bony abnormality.

## 2021-01-22 MED ORDER — VALACYCLOVIR HCL 500 MG PO TABS
500.0000 mg | ORAL_TABLET | Freq: Every day | ORAL | Status: DC
  Administered 2021-01-22 – 2021-01-23 (×2): 500 mg via ORAL
  Filled 2021-01-22 (×2): qty 1

## 2021-01-22 MED ORDER — DICLOFENAC SODIUM 1 % EX GEL
4.0000 g | Freq: Four times a day (QID) | CUTANEOUS | Status: DC
  Administered 2021-01-22 – 2021-01-23 (×6): 4 g via TOPICAL
  Filled 2021-01-22: qty 100

## 2021-01-22 MED ORDER — FENTANYL CITRATE (PF) 100 MCG/2ML IJ SOLN
25.0000 ug | Freq: Once | INTRAMUSCULAR | Status: AC
  Administered 2021-01-22: 25 ug via INTRAVENOUS
  Filled 2021-01-22: qty 2

## 2021-01-22 NOTE — Progress Notes (Signed)
VASCULAR LAB    Attempted to perform carotid and lower extremity venous duplex 3 times 01/22/21.  1st attempt at 09:15, patient was having breakfast and getting continuous EEG.  2nd attempt, pt with staff.  3rd attempt, patient fully dressed in pajamas, sitting up on side of bed and still connected to continuous EEG.   Will try again as schedule permits.    Minetta Krisher, RVT 01/22/2021, 12:09 PM

## 2021-01-22 NOTE — Progress Notes (Signed)
vLTM EEG maintenance complete. No skin breakdown seen at FP1 P7 and A1. Continue to monitor

## 2021-01-22 NOTE — Plan of Care (Signed)
  Problem: Education: Goal: Knowledge of disease or condition will improve Outcome: Progressing Goal: Knowledge of secondary prevention will improve Outcome: Progressing Goal: Knowledge of patient specific risk factors addressed and post discharge goals established will improve Outcome: Progressing   Problem: Ischemic Stroke/TIA Tissue Perfusion: Goal: Complications of ischemic stroke/TIA will be minimized Outcome: Progressing   Problem: Education: Goal: Knowledge of General Education information will improve Description: Including pain rating scale, medication(s)/side effects and non-pharmacologic comfort measures Outcome: Progressing   Problem: Health Behavior/Discharge Planning: Goal: Ability to manage health-related needs will improve Outcome: Progressing   Problem: Clinical Measurements: Goal: Ability to maintain clinical measurements within normal limits will improve Outcome: Progressing   Problem: Clinical Measurements: Goal: Ability to maintain clinical measurements within normal limits will improve Outcome: Progressing Goal: Will remain free from infection Outcome: Progressing Goal: Diagnostic test results will improve Outcome: Progressing Goal: Respiratory complications will improve Outcome: Progressing Goal: Cardiovascular complication will be avoided Outcome: Progressing   Problem: Clinical Measurements: Goal: Will remain free from infection Outcome: Progressing   Problem: Activity: Goal: Risk for activity intolerance will decrease Outcome: Progressing   Problem: Nutrition: Goal: Adequate nutrition will be maintained Outcome: Progressing   Problem: Coping: Goal: Level of anxiety will decrease Outcome: Progressing   Problem: Elimination: Goal: Will not experience complications related to bowel motility Outcome: Progressing Goal: Will not experience complications related to urinary retention Outcome: Progressing   Problem: Pain Managment: Goal:  General experience of comfort will improve Outcome: Progressing   Problem: Safety: Goal: Ability to remain free from injury will improve Outcome: Progressing   Problem: Skin Integrity: Goal: Risk for impaired skin integrity will decrease Outcome: Progressing

## 2021-01-22 NOTE — Procedures (Addendum)
Patient Name: Desiree Russell  MRN: [id number]  Epilepsy Attending: Lora Havens  Referring Physician/Provider: Dr Roland Rack Duration: 01/21/2021 1026 to 01/22/2021 1026  Patient history: 49yo F with transient alteration of awareness. EEG to evaluate for seizure  Level of alertness: Awake, asleep  AEDs during EEG study: GBP  Technical aspects: This EEG study was done with scalp electrodes positioned according to the 10-20 International system of electrode placement. Electrical activity was acquired at a sampling rate of 500Hz  and reviewed with a high frequency filter of 70Hz  and a low frequency filter of 1Hz . EEG data were recorded continuously and digitally stored.   Description: The posterior dominant rhythm consists of 9-10 Hz activity of moderate voltage (25-35 uV) seen predominantly in posterior head regions, symmetric and reactive to eye opening and eye closing. Sleep was characterized by vertex waves, sleep spindles (12 to 14 Hz), maximal frontocentral region. Hyperventilation and photic stimulation were not performed.     IMPRESSION: This study is within normal limits. No seizures or epileptiform discharges were seen throughout the recording.   Tedric Leeth Barbra Sarks

## 2021-01-22 NOTE — Progress Notes (Signed)
Inpatient Rehab Admissions Coordinator:   I spoke with Pt. Regarding CIR consult. Pt. Insists she wants to go home with [facility] PT/OT. She was educated on benefits of CIR and risks of going home and she states that she wants to go home. CIR will sign off. TOC and MD aware.  Clemens Catholic, [place], [facility] Admissions Coordinator  [phone] ([place]) [phone] (office)

## 2021-01-22 NOTE — Plan of Care (Signed)
  Problem: Activity: Goal: Risk for activity intolerance will decrease Outcome: Progressing   Problem: Nutrition: Goal: Adequate nutrition will be maintained Outcome: Progressing   

## 2021-01-22 NOTE — Discharge Summary (Addendum)
Physician Discharge Summary  Desiree Russell IRC:[id number] DOB: 1970/08/23 DOA: 01/20/2021  PCP: Vevelyn Francois, NP  Admit date: 01/20/2021 Discharge date: 01/23/2021  Time spent: 45 minutes  Recommendations for Outpatient Follow-up:  Patient will be discharged to home with home health services.  Patient will need to follow up with primary care provider within one week of discharge.  Follow up with psychiatry and neurology. Patient should continue medications as prescribed.  Patient should follow a heart healthy diet.   Discharge Diagnoses:  Acute CVA    Essential hypertension Chronic depression/anxiety Chronic headache GERD Obesity History of COVID-19 Auditory hallucinations  Discharge Condition: Stable  Diet recommendation: heart healthy  Filed Weights   01/20/21 0958 01/23/21 0411  Weight: 93 kg 94.2 kg    History of present illness:  on 01/20/2021 by Dr. Irene Pap Desiree Russell is a 50 y.o. female with medical history significant for previous CVA, hypertension, chronic anxiety/depression, fibromyalgia, PTSD, hyperlipidemia, positive COVID-19 screening test 2 months ago, pseudoseizures, who presented to [facility] ED via EMS as a code stroke.  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  Reported waking up this morning with a headache throbbing like a band around her head.  Due to concern for an acute stroke, patient was taken emergently at the CT suite.  CT head showed no evidence of acute abnormality however it showed small chronic right frontal and cerebellar infarcts.  Patient was last known well around 8:15 AM.  She was not given tPA, per neurology not a candidate.  Patient was seen by the neurology/stroke team with recommendation for MRI brain without contrast and to continue  antiplatelet aspirin 81 mg daily.  MRI brain revealed probable punctate acute or early subacute infarct in the right parietal lobe.  Neurology recommended admission by hospitalist team for stroke work-up.  Hospital Course:  Acute CVA    -?  Incidental finding -Patient tells me she has been having right sided weakness, chronic falls and dizziness -MRI brain showed acute or early subacute infarct in the right parietal lobe.  No significant edema or mass-effect.  Remote cerebellar lacunar infarcts, suspected small remote right frontal cortical infarct. Mild T2/FLAIR hyperintensities within the frontal predominant white matter likely related to chronic microvascular ischemia chronic migraines -Echocardiogram shows an EF 60 to 65%, LVH, no regional wall motion abnormalities. -LDL 110, hemoglobin A1c 4.8 -Neurology consulted and appreciated -Not entirely convinced that patient's complaints are due to CVA.  Question seizure activity, Long term EEG unremarkable for seizure activity -Transcranial Doppler has been done.  Per neurology, Dr. Erlinda Hong, unremarkable -Carotid US has been done, per neurology, unremarkable -PT recommended CIR -OT recommended supervision intermittent, refrain from driving -Inpatient rehab consulted, however patient refusing inpatient rehab and would like to return home -TOC made aware -Currently on aspirin, statin, Plavix -Neurology recommending aspirin 81 mg daily along with Plavix 25 mg daily for 3 weeks then followed by Plavix alone  ?  PNES -See discussion as above, work-up has been unremarkable for seizures -Possibly related to stress and depression   Essential hypertension -Given CVA, allowing for permissive hypertension -Continue metoprolol on discharge   Chronic depression/anxiety -Continue Cymbalta, Symbyax, Ativan as needed   Chronic headache -Continue Ubrelvy as needed   GERD -Continue PPI   Obesity -BMI 35.19 -  Patient follow-up with PCP to discuss  lifestyle modifications   History of COVID-19 -stable   Auditory hallucinations -Psychiatry consulted and appreciated, feels the patient should follow-up with Dr. Adele Schilder  Consultants Neurology Psychiatry   Procedures  Echocardiogram Carotid Doppler Transcranial Doppler EEG  Discharge Exam: Vitals:   01/23/21 0824 01/23/21 1215  BP: (!) 115/54 135/84  Pulse: 81 (!) 107  Resp: 12 20  Temp: 97.8 F (36.6 C) 97.8 F (36.6 C)  SpO2: 98% 99%    General: Well developed, well nourished, NAD, appears stated age HEENT: NCAT, mucous membranes moist. Cardiovascular: S1 S2 auscultated, no rubs, murmurs or gallops. Regular rate and rhythm. Respiratory: Clear to auscultation bilaterally with equal chest rise Abdomen: Soft, nontender, nondistended, + bowel sounds Extremities: warm dry without cyanosis clubbing or edema Neuro: AAOx3, nonfocal Psych: patient had visual hallucination during my examination in the room  Discharge Instructions Discharge Instructions     Ambulatory referral to Neurology   Complete by: As directed    Follow up with Dr. Felecia Shelling at [facility] in 4-6 weeks. Pt is Dr. Garth Bigness pt. Thanks.   Diet - low sodium heart healthy   Complete by: As directed    Discharge instructions   Complete by: As directed    Patient will be discharged to home with home health services.  Patient will need to follow up with primary care provider within one week of discharge.  Follow up with psychiatry and neurology. Patient should continue medications as prescribed.  Patient should follow a heart healthy diet. Do not drive for 6 months. You will take aspirin and plavix together for 21 days, then plavix alone.   Increase activity slowly   Complete by: As directed       Allergies as of 01/23/2021       Reactions   Codeine Hives, Rash   Morphine Hives, Other (See Comments)   Headache   Morphine And Related Hives   Oxycodone Hives   Chlorhexidine Hives, Rash           Medication  List     TAKE these medications    albuterol 108 (90 Base) MCG/ACT inhaler Commonly known as: VENTOLIN HFA INHALE 2 PUFFS INTO THE LUNGS EVERY 4 HOURS AS NEEDED FOR WHEEZING. What changed:  how much to take how to take this when to take this reasons to take this   ALPRAZolam 0.5 MG tablet Commonly known as: Xanax Take 1 tablet (0.5 mg total) by mouth 2 (two) times daily as needed for anxiety.   aspirin 81 MG EC tablet Take 1 tablet (81 mg total) by mouth daily.   atorvastatin 40 MG tablet Commonly known as: LIPITOR Take 1 tablet (40 mg total) by mouth daily. Start taking on: January 24, 2021   AZO CRANBERRY PO Take 2 capsules by mouth as needed (UTI).   cetirizine 10 MG tablet Commonly known as: ZYRTEC Take 10 mg by mouth daily.   clopidogrel 75 MG tablet Commonly known as: PLAVIX Take 1 tablet (75 mg total) by mouth daily. Start taking on: January 24, 2021   diclofenac sodium 1 % Gel Commonly known as: VOLTAREN Apply 2 g topically 2 (two) times daily as needed (apply to chest).   DULoxetine 30 MG capsule Commonly known as: Cymbalta Take 1 capsule (30 mg total) by mouth 2 (two) times daily.   Emgality 120 MG/ML Soaj Generic drug: Galcanezumab-gnlm Inject 120 mg into the skin every 30 (thirty) days.   esomeprazole 20 MG packet Commonly  known as: NEXIUM Take 20 mg by mouth daily before breakfast.   metoprolol succinate 50 MG 24 hr tablet Commonly known as: TOPROL-XL Take 1 tablet (50 mg total) by mouth daily.   OLANZapine-FLUoxetine 6-25 MG capsule Commonly known as: Symbyax Take 1 capsule by mouth at bedtime.   sennosides-docusate sodium 8.6-50 MG tablet Commonly known as: SENOKOT-S Take 4 tablets by mouth at bedtime.   Ubrelvy 100 MG Tabs Generic drug: Ubrogepant Take 100 mg by mouth as needed (migraine).   valACYclovir 500 MG tablet Commonly known as: VALTREX Take 1 tablet (500 mg total) by mouth daily.   VITAMIN B COMPLEX PO Take 1 capsule by  mouth daily.   Vitamin D3 25 MCG (1000 UT) Caps Take 1 tablet by mouth daily.       ASK your doctor about these medications    gabapentin 100 MG capsule Commonly known as: NEURONTIN Take 1 capsule by mouth at bedtime.   Ibuprofen-Famotidine 800-26.6 MG Tabs Commonly known as: Duexis Take 1 tablet by mouth in the morning, at noon, and at bedtime.       Allergies  Allergen Reactions   Codeine Hives and Rash   Morphine Hives and Other (See Comments)    Headache   Morphine And Related Hives   Oxycodone Hives   Chlorhexidine Hives and Rash         Follow-up Information     Vevelyn Francois, NP. Schedule an appointment as soon as possible for a visit in 1 week(s).   Specialty: Adult Health Nurse Practitioner Why: Hosptial follow up Contact information: [address] [place]enee [place] [address] [phone]         [name], MD. Schedule an appointment as soon as possible for a visit in 1 week(s).   Specialty: Psychiatry Why: Hospital follow up Contact information: [place] [address] [phone]         Britt Bottom, MD. Schedule an appointment as soon as possible for a visit in 1 month(s).   Specialty: Neurology Contact information: [place] [address] [phone]                  The results of significant diagnostics from this hospitalization (including imaging, microbiology, ancillary and laboratory) are listed below for reference.    Significant Diagnostic Studies: CT ANGIO HEAD NECK W WO CM  Result Date: 01/21/2021 CLINICAL DATA:  Stroke follow-up. EXAM: CT ANGIOGRAPHY HEAD AND NECK TECHNIQUE: Multidetector CT imaging of the head and neck was performed using the standard protocol during bolus administration of intravenous contrast. Multiplanar CT image reconstructions and MIPs were obtained to evaluate the vascular anatomy. Carotid stenosis measurements (when applicable) are obtained  utilizing NASCET criteria, using the distal internal carotid diameter as the denominator. CONTRAST:  155mL OMNIPAQUE IOHEXOL 350 MG/ML SOLN COMPARISON:  MRI and CT January 20, 2021. FINDINGS: CT HEAD FINDINGS Brain: No evidence of acute large vascular territory infarction, hemorrhage, hydrocephalus, extra-axial collection or mass lesion/mass effect. Probable punctate acute right parietal infarct seen on recent MRI is not visible by CT. Vascular: See below. Skull: No acute fracture. Sinuses: The visualized sinuses are clear. Orbits: No acute findings in the visualized orbits. Review of the MIP images confirms the above findings CTA NECK FINDINGS Aortic arch: Great vessel origins are patent. Right carotid system: Motion limited evaluation without visible hemodynamically significant (greater than 50%). Poorly visualized carotid bifurcation without definite hemodynamically significant stenosis. Left carotid system: Limited evaluation  due to patient motion with suspected mild to moderate stenosis of the left ICA at C1-C2. Poorly visualized carotid bifurcation without definite hemodynamically significant stenosis. Vertebral arteries: Bilateral vertebral arteries are small and poorly evaluated due to artifact on this study. Nondiagnostic evaluation of the proximal left vertebral artery due to superimposed streak artifact from pooled venous contrast. The vertebral arteries are poorly visualized at C4-C5, which may be in part artifactual given artifact in this region; however, stenosis is not excluded. Skeleton: Saturated cervical kyphosis. Mild-to-moderate degenerative disc disease at C6-C7. Other neck: No evidence of acute abnormality. Upper chest: Visualized lung apices are clear. Review of the MIP images confirms the above findings CTA HEAD FINDINGS Anterior circulation: Bilateral intracranial ICAs, MCAs, and ACAs are patent. 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 Otherwise, no evidence of proximal  hemodynamically significant stenosis. No aneurysm. Posterior circulation: Small vertebrobasilar system with bilateral fetal type PCAs, anatomic variant. Moderate right intradural vertebral artery narrowing. The distal left vertebral artery is very small and poorly visualized, which could represent anatomic variation with the left vertebral artery predominantly terminating as PICA or a superimposed stenosis. Bilateral PCAs are patent without evidence of proximal hemodynamically significant stenosis. Limited evaluation of the distal PCAs due to venous contamination. Venous sinuses: As permitted by contrast timing, patent. Rounded filling defects in the superior sagittal sinus likely represent arachnoid granulations. Anatomic variants: Detailed above. Review of the MIP images confirms the above findings IMPRESSION: CT head: No evidence of acute intracranial abnormality. Probable punctate acute right parietal infarct seen on recent MRI is not visible by CT. CTA Head: 1. Very small vertebrobasilar system with bilateral fetal type PCAs, anatomic variant. Moderate right intradural vertebral artery narrowing. The distal left vertebral artery is very small and poorly visualized, which could represent anatomic variation with the left vertebral artery predominantly terminating as PICA or a superimposed stenosis. The basilar artery is small throughout its course. 2. Otherwise, no large vessel occlusion or proximal hemodynamically significant stenosis. CTA Neck: 1. Significantly limited evaluation due to patient motion. 2. Suspected mild to moderate stenosis of the left ICA at C1-C2. 3. Vertebral arteries are small poorly evaluated due to their small size and motion artifact. The vertebral arteries appear poorly opacified at C4-C5, which may be in part artifactual given artifact in this region; however, stenosis is not excluded. A repeat CTA could further characterize if clinically indicated. Electronically Signed   By:  Margaretha Sheffield MD   On: 01/21/2021 15:12   MR BRAIN WO CONTRAST  Result Date: 01/20/2021 CLINICAL DATA:  Neuro deficit, acute stroke suspected. EXAM: MRI HEAD WITHOUT CONTRAST TECHNIQUE: Multiplanar, multiecho pulse sequences of the brain and surrounding structures were obtained without intravenous contrast. COMPARISON:  Same day CT code stroke.  MRI [id number]. FINDINGS: Brain: Probable punctate acute or early subacute infarct in the right parietal lobe (series 3, image 27). No substantial edema or mass effect. Remote left greater than right cerebellar lacunar infarcts. Suspected small remote right frontal cortical infarct (series 7, image 19) no hydrocephalus. No acute hemorrhage. No extra-axial fluid collection. No midline shift. Basal cisterns are patent. Mild T2/FLAIR hyperintensities within the frontal predominant white matter. Vascular: Major arterial flow voids are maintained at the skull base. Skull and upper cervical spine: Normal marrow signal. Sinuses/Orbits: Largely clear sinuses.  Unremarkable orbits. Other: No mastoid effusions. IMPRESSION: 1. Probable punctate acute or early subacute infarct in the right parietal lobe (series 3, image 27). No significant edema or mass effect. 2. Remote cerebellar lacunar infarcts  and suspected small remote right frontal cortical infarct. 3. Mild T2/FLAIR hyperintensities within the frontal predominant white matter, most likely related to chronic microvascular ischemia or chronic migraines. Electronically Signed   By: Margaretha Sheffield MD   On: 01/20/2021 18:29   DG Chest Portable 1 View  Result Date: 01/20/2021 CLINICAL DATA:  Chest tightness. EXAM: PORTABLE CHEST 1 VIEW COMPARISON:  10/21/2020 and older studies. FINDINGS: Cardiac silhouette is borderline enlarged. No mediastinal or hilar masses. Clear lungs.  No pleural effusion or pneumothorax. Skeletal structures are grossly intact. IMPRESSION: No active disease. Electronically Signed   By: Lajean Manes  M.D.   On: 01/20/2021 10:55   DG Shoulder Right Port  Result Date: 01/22/2021 CLINICAL DATA:  Fall, right shoulder pain EXAM: PORTABLE RIGHT SHOULDER COMPARISON:  None. FINDINGS: There is no evidence of fracture or dislocation. There is no evidence of arthropathy or other focal bone abnormality. Soft tissues are unremarkable. IMPRESSION: Negative. Electronically Signed   By: Rolm Baptise M.D.   On: 01/22/2021 21:54   Overnight EEG with video  Result Date: 01/22/2021 Lora Havens, MD     01/23/2021  8:46 AM Patient Name: Desiree Russell MRN: [id number] Epilepsy Attending: Lora Havens Referring Physician/Provider: Dr Roland Rack Duration: 01/21/2021 1026 to 01/22/2021 1026 Patient history: 49yo F with transient alteration of awareness. EEG to evaluate for seizure Level of alertness: Awake, asleep AEDs during EEG study: GBP Technical aspects: This EEG study was done with scalp electrodes positioned according to the 10-20 International system of electrode placement. Electrical activity was acquired at a sampling rate of 500Hz  and reviewed with a high frequency filter of 70Hz  and a low frequency filter of 1Hz . EEG data were recorded continuously and digitally stored. Description: The posterior dominant rhythm consists of 9-10 Hz activity of moderate voltage (25-35 uV) seen predominantly in posterior head regions, symmetric and reactive to eye opening and eye closing. Sleep was characterized by vertex waves, sleep spindles (12 to 14 Hz), maximal frontocentral region. Hyperventilation and photic stimulation were not performed.   IMPRESSION: This study is within normal limits. No seizures or epileptiform discharges were seen throughout the recording.  Priyanka O Yadav   VAS [name] TRANSCRANIAL DOPPLER W BUBBLES  Result Date: 01/23/2021  Transcranial Doppler with Bubble Patient Name:  Desiree Russell  Date of Exam:   01/23/2021 Medical Rec #: [id number]     Accession #:    [id number] Date of Birth: 17-Jan-1971     Patient Gender: F Patient Age:   89Y Exam Location:  [facility] Procedure:      VAS [name] TRANSCRANIAL Candise Bowens Referring Phys: [id number] Rosalin Hawking --------------------------------------------------------------------------------  Indications: Stroke. Comparison Study: No prior studies. Performing Technologist: Oliver Hum RVT  Examination Guidelines: A complete evaluation includes B-mode imaging, spectral Doppler, color Doppler, and power Doppler as needed of all accessible portions of each vessel. Bilateral testing is considered an integral part of a complete examination. Limited examinations for reoccurring indications may be performed as noted.  Summary:  A vascular evaluation was performed. The left middle cerebral artery was studied. An IV was inserted into the patient's right Cephalic. Verbal informed consent was obtained.  No HITS detected with valsalva maneuver. Negative for PFO. *See table(s) above for TCD measurements and observations.    Preliminary    ECHOCARDIOGRAM COMPLETE  Result Date: 01/21/2021    ECHOCARDIOGRAM REPORT   Patient Name:   Desiree Russell Date of Exam: 01/21/2021 Medical Rec #:  [id number]  Height:       64.0 in Accession #:    [id number]   Weight:       205.0 lb Date of Birth:  03-May-1971   BSA:          1.977 m Patient Age:    37 years     BP:           133/90 mmHg Patient Gender: F            HR:           85 bpm. Exam Location:  Inpatient Procedure: 2D Echo, Cardiac Doppler and Color Doppler Indications:    Stroke  History:        Patient has no prior history of Echocardiogram examinations.                 Risk Factors:Hypertension and Dyslipidemia. H/o CVA. H/o                 COVID-19 infection.  Sonographer:    Clayton Lefort RDCS (AE) Referring Phys: [id number] [place] METZGER-CIHELKA  Sonographer Comments: Suboptimal subcostal window. IMPRESSIONS  1. Left ventricular ejection fraction, by estimation, is 60 to 65%. The left ventricle has normal function. The left  ventricle has no regional wall motion abnormalities. There is moderate left ventricular hypertrophy. Left ventricular diastolic parameters were normal.  2. Right ventricular systolic function is normal. The right ventricular size is normal. There is normal pulmonary artery systolic pressure. The estimated right ventricular systolic pressure is 67.3 mmHg.  3. Left atrial size was mildly dilated.  4. The mitral valve is abnormal. Eccentric posterior directed jet. Mild to moderate mitral valve regurgitation. No evidence of mitral stenosis.  5. The aortic valve is tricuspid. Aortic valve regurgitation is not visualized. Mild aortic valve sclerosis is present, with no evidence of aortic valve stenosis.  6. The inferior vena cava is normal in size with <50% respiratory variability, suggesting right atrial pressure of 8 mmHg. FINDINGS  Left Ventricle: Left ventricular ejection fraction, by estimation, is 60 to 65%. The left ventricle has normal function. The left ventricle has no regional wall motion abnormalities. The left ventricular internal cavity size was normal in size. There is  moderate left ventricular hypertrophy. Left ventricular diastolic parameters were normal. Right Ventricle: The right ventricular size is normal. No increase in right ventricular wall thickness. Right ventricular systolic function is normal. There is normal pulmonary artery systolic pressure. The tricuspid regurgitant velocity is 2.08 m/s, and  with an assumed right atrial pressure of 8 mmHg, the estimated right ventricular systolic pressure is 41.9 mmHg. Left Atrium: Left atrial size was mildly dilated. Right Atrium: Right atrial size was normal in size. Pericardium: Trivial pericardial effusion is present. Mitral Valve: The mitral valve is abnormal. Mild to moderate mitral valve regurgitation. No evidence of mitral valve stenosis. MV peak gradient, 6.2 mmHg. The mean mitral valve gradient is 2.0 mmHg. Tricuspid Valve: The tricuspid valve is  normal in structure. Tricuspid valve regurgitation is trivial. Aortic Valve: The aortic valve is tricuspid. Aortic valve regurgitation is not visualized. Mild aortic valve sclerosis is present, with no evidence of aortic valve stenosis. Aortic valve mean gradient measures 5.0 mmHg. Aortic valve peak gradient measures 10.0 mmHg. Aortic valve area, by VTI measures 1.77 cm. Pulmonic Valve: The pulmonic valve was not well visualized. Pulmonic valve regurgitation is not visualized. Aorta: The aortic root and ascending aorta are structurally normal, with no evidence of dilitation. Venous: The inferior vena cava is  normal in size with less than 50% respiratory variability, suggesting right atrial pressure of 8 mmHg. IAS/Shunts: The interatrial septum was not well visualized.  LEFT VENTRICLE PLAX 2D LVIDd:         5.00 cm  Diastology LVIDs:         3.00 cm  LV e' medial:    9.90 cm/s LV PW:         1.30 cm  LV E/e' medial:  9.1 LV IVS:        1.00 cm  LV e' lateral:   15.80 cm/s LVOT diam:     2.00 cm  LV E/e' lateral: 5.7 LV SV:         52 LV SV Index:   27 LVOT Area:     3.14 cm  RIGHT VENTRICLE             IVC RV Basal diam:  2.60 cm     IVC diam: 1.20 cm RV S prime:     10.80 cm/s TAPSE (M-mode): 2.4 cm LEFT ATRIUM             Index       RIGHT ATRIUM           Index LA diam:        2.60 cm 1.32 cm/m  RA Area:     12.40 cm LA Vol (A2C):   63.4 ml 32.07 ml/m RA Volume:   26.00 ml  13.15 ml/m LA Vol (A4C):   70.8 ml 35.81 ml/m LA Biplane Vol: 69.1 ml 34.95 ml/m  AORTIC VALVE AV Area (Vmax):    1.78 cm AV Area (Vmean):   1.77 cm AV Area (VTI):     1.77 cm AV Vmax:           158.00 cm/s AV Vmean:          104.000 cm/s AV VTI:            0.296 m AV Peak Grad:      10.0 mmHg AV Mean Grad:      5.0 mmHg LVOT Vmax:         89.60 cm/s LVOT Vmean:        58.700 cm/s LVOT VTI:          0.167 m LVOT/AV VTI ratio: 0.56  AORTA Ao Root diam: 2.90 cm Ao Asc diam:  2.60 cm MITRAL VALVE                 TRICUSPID VALVE MV Area  (PHT): 3.31 cm      TR Peak grad:   17.3 mmHg MV Area VTI:   1.83 cm      TR Vmax:        208.00 cm/s MV Peak grad:  6.2 mmHg MV Mean grad:  2.0 mmHg      SHUNTS MV Vmax:       1.25 m/s      Systemic VTI:  0.17 m MV Vmean:      71.3 cm/s     Systemic Diam: 2.00 cm MV Decel Time: 229 msec MR Peak grad:    105.7 mmHg MR Mean grad:    75.0 mmHg MR Vmax:         514.00 cm/s MR Vmean:        411.0 cm/s MR PISA:         1.57 cm MR PISA Eff ROA: 12 mm MR PISA Radius:  0.50 cm MV E velocity: 89.60  cm/s MV A velocity: 91.20 cm/s MV E/A ratio:  0.98 Oswaldo Milian MD Electronically signed by Oswaldo Milian MD Signature Date/Time: 01/21/2021/12:48:22 PM    Final    DG HIP UNILAT WITH PELVIS 2-3 VIEWS RIGHT  Result Date: 01/22/2021 CLINICAL DATA:  Fall, lateral right hip pain EXAM: DG HIP (WITH OR WITHOUT PELVIS) 2-3V RIGHT COMPARISON:  None. FINDINGS: Hip joints and SI joints symmetric. No acute bony abnormality. Specifically, no fracture, subluxation, or dislocation. IMPRESSION: No acute bony abnormality. Electronically Signed   By: Rolm Baptise M.D.   On: 01/22/2021 19:42   CT HEAD CODE STROKE WO CONTRAST  Result Date: 01/20/2021 CLINICAL DATA:  Code stroke. Right-sided weakness, headache, and aphasia. EXAM: CT HEAD WITHOUT CONTRAST TECHNIQUE: Contiguous axial images were obtained from the base of the skull through the vertex without intravenous contrast. COMPARISON:  Head CT and MRI 08/18/2019 FINDINGS: Brain: There is no evidence of an acute infarct, intracranial hemorrhage, mass, midline shift, or extra-axial fluid collection. A small chronic infarct is again noted in the right middle frontal gyrus, and there are small chronic cerebellar infarcts. The ventricles are normal in size. Vascular: No hyperdense vessel. Skull: No fracture or suspicious osseous lesion. Sinuses/Orbits: Visualized paranasal sinuses and mastoid air cells are clear. Rightward gaze. Other: None. ASPECTS [facility] Stroke Program  Early CT Score) - Ganglionic level infarction (caudate, lentiform nuclei, internal capsule, insula, M1-M3 cortex): 7 - Supraganglionic infarction (M4-M6 cortex): 3 Total score (0-10 with 10 being normal): 10 IMPRESSION: 1. No evidence of acute intracranial abnormality. 2. ASPECTS is 10. 3. 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 These results were communicated to Dr. Lorrin Goodell at 9:54 am on 01/20/2021 by text page via the [facility] messaging system. Electronically Signed   By: Logan Bores M.D.   On: 01/20/2021 09:53   VAS US CAROTID  Result Date: 01/23/2021 Carotid Arterial Duplex Study Patient Name:  Desiree Russell  Date of Exam:   01/23/2021 Medical Rec #: [id number]     Accession #:    [id number] Date of Birth: [birth year]    Patient Gender: F Patient Age:   75Y Exam Location:  [facility] Procedure:      VAS US CAROTID Referring Phys: [id number] Rosalin Hawking --------------------------------------------------------------------------------  Indications:       CVA. Risk Factors:      None. Comparison Study:  No prior studies. Performing Technologist: Oliver Hum RVT  Examination Guidelines: A complete evaluation includes B-mode imaging, spectral Doppler, color Doppler, and power Doppler as needed of all accessible portions of each vessel. Bilateral testing is considered an integral part of a complete examination. Limited examinations for reoccurring indications may be performed as noted.  Right Carotid Findings: +----------+--------+--------+--------+-----------------------+--------+           PSV cm/sEDV cm/sStenosisPlaque Description     Comments +----------+--------+--------+--------+-----------------------+--------+ CCA Prox  140     24              smooth and heterogenoustortuous +----------+--------+--------+--------+-----------------------+--------+ CCA Distal90      22              smooth and heterogenous          +----------+--------+--------+--------+-----------------------+--------+ ICA Prox  119     31              smooth and heterogenous         +----------+--------+--------+--------+-----------------------+--------+ ICA Distal81      33  tortuous +----------+--------+--------+--------+-----------------------+--------+ ECA       57      9                                               +----------+--------+--------+--------+-----------------------+--------+ +----------+--------+-------+--------+-------------------+           PSV cm/sEDV cmsDescribeArm Pressure (mmHG) +----------+--------+-------+--------+-------------------+ Subclavian162                                        +----------+--------+-------+--------+-------------------+ +---------+--------+--+--------+--+---------+ VertebralPSV cm/s37EDV cm/s10Antegrade +---------+--------+--+--------+--+---------+  Left Carotid Findings: +----------+--------+--------+--------+-----------------------+--------+           PSV cm/sEDV cm/sStenosisPlaque Description     Comments +----------+--------+--------+--------+-----------------------+--------+ CCA Prox  105     20              smooth and heterogenous         +----------+--------+--------+--------+-----------------------+--------+ CCA Distal85      21              smooth and heterogenous         +----------+--------+--------+--------+-----------------------+--------+ ICA Prox  83      23              smooth and heterogenous         +----------+--------+--------+--------+-----------------------+--------+ ICA Distal91      33                                     tortuous +----------+--------+--------+--------+-----------------------+--------+ ECA       92      11                                              +----------+--------+--------+--------+-----------------------+--------+  +----------+--------+--------+--------+-------------------+           PSV cm/sEDV cm/sDescribeArm Pressure (mmHG) +----------+--------+--------+--------+-------------------+ [id number]                                         +----------+--------+--------+--------+-------------------+ +---------+--------+--+--------+--+---------+ VertebralPSV cm/s45EDV cm/s12Antegrade +---------+--------+--+--------+--+---------+   Summary: Right Carotid: Velocities in the right ICA are consistent with a 1-39% stenosis. Left Carotid: Velocities in the left ICA are consistent with a 1-39% stenosis. Vertebrals: Bilateral vertebral arteries demonstrate antegrade flow. *See table(s) above for measurements and observations.     Preliminary     Microbiology: Recent Results (from the past 240 hour(s))  Resp Panel by RT-PCR (Flu A&B, Covid) Nasopharyngeal Swab     Status: None   Collection Time: 01/20/21  9:32 AM   Specimen: Nasopharyngeal Swab; Nasopharyngeal(NP) swabs in vial transport medium  Result Value Ref Range Status   SARS Coronavirus 2 by RT PCR NEGATIVE NEGATIVE Final    Comment: (NOTE) SARS-CoV-2 target nucleic acids are NOT DETECTED.  The SARS-CoV-2 RNA is generally detectable in upper respiratory specimens during the acute phase of infection. The lowest concentration of SARS-CoV-2 viral copies this assay can detect is 138 copies/mL. A negative result does not preclude SARS-Cov-2 infection and should not be used as the sole basis for treatment or other  patient management decisions. A negative result may occur with  improper specimen collection/handling, submission of specimen other than nasopharyngeal swab, presence of viral mutation(s) within the areas targeted by this assay, and inadequate number of viral copies(<138 copies/mL). A negative result must be combined with clinical observations, patient history, and epidemiological information. The expected result is Negative.  Fact  Sheet for Patients:  EntrepreneurPulse.com.au  Fact Sheet for Healthcare Providers:  IncredibleEmployment.be  This test is no t yet approved or cleared by the [place] FDA and  has been authorized for detection and/or diagnosis of SARS-CoV-2 by FDA under an Emergency Use Authorization (EUA). This EUA will remain  in effect (meaning this test can be used) for the duration of the COVID-19 declaration under Section 564(b)(1) of the Act, 21 U.S.C.section 360bbb-3(b)(1), unless the authorization is terminated  or revoked sooner.       Influenza A by PCR NEGATIVE NEGATIVE Final   Influenza B by PCR NEGATIVE NEGATIVE Final    Comment: (NOTE) The Xpert Xpress SARS-CoV-2/FLU/RSV plus assay is intended as an aid in the diagnosis of influenza from Nasopharyngeal swab specimens and should not be used as a sole basis for treatment. Nasal washings and aspirates are unacceptable for Xpert Xpress SARS-CoV-2/FLU/RSV testing.  Fact Sheet for Patients: EntrepreneurPulse.com.au  Fact Sheet for Healthcare Providers: IncredibleEmployment.be  This test is not yet approved or cleared by the [place] FDA and has been authorized for detection and/or diagnosis of SARS-CoV-2 by FDA under an Emergency Use Authorization (EUA). This EUA will remain in effect (meaning this test can be used) for the duration of the COVID-19 declaration under Section 564(b)(1) of the Act, 21 U.S.C. section 360bbb-3(b)(1), unless the authorization is terminated or revoked.  Performed at [facility], [place] [address] [place], [place] [address]      Labs: Basic Metabolic Panel: Recent Labs  Lab 01/20/21 0939 01/20/21 0947 01/21/21 0156  NA 136 139 134*  K 4.4 4.4 3.7  CL 104 105 106  CO2 27  --  23  GLUCOSE 91 85 91  BUN 13 14 15   CREATININE 1.07* 1.00 0.94  CALCIUM 8.9  --  8.3*  MG  --   --  1.8  PHOS  --   --  3.4    Liver Function Tests: Recent Labs  Lab 01/20/21 0939  AST 19  ALT 15  ALKPHOS 39  BILITOT 0.4  PROT 6.5  ALBUMIN 3.5   No results for input(s): LIPASE, AMYLASE in the last 168 hours. No results for input(s): AMMONIA in the last 168 hours. CBC: Recent Labs  Lab 01/20/21 0939 01/20/21 0947 01/21/21 0156  WBC 5.0  --  6.1  NEUTROABS 2.4  --   --   HGB 11.9* 13.3 10.9*  HCT 36.8 39.0 32.6*  MCV 92.2  --  91.1  PLT 214  --  221   Cardiac Enzymes: No results for input(s): CKTOTAL, CKMB, CKMBINDEX, TROPONINI in the last 168 hours. BNP: BNP (last 3 results) Recent Labs    11/29/20 1617  BNP 7.1    ProBNP (last 3 results) No results for input(s): PROBNP in the last 8760 hours.  CBG: Recent Labs  Lab 01/20/21 0939 01/20/21 2156  GLUCAP 85 92       Signed:  [facility] 01/23/2021, 3:01 PM

## 2021-01-22 NOTE — Progress Notes (Signed)
PROGRESS NOTE    Desiree Russell  UJW:[id number] DOB: 03/02/1971 DOA: 01/20/2021 PCP: Vevelyn Francois, NP   Brief Narrative:  HPI on 01/20/2021 by Dr. Irene Pap Desiree Russell is a 50 y.o. female with medical history significant for previous CVA, hypertension, chronic anxiety/depression, fibromyalgia, PTSD, hyperlipidemia, positive COVID-19 screening test 2 months ago, pseudoseizures, who presented to [facility] ED via EMS as a code stroke.  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  Reported waking up this morning with a headache throbbing like a band around her head.  Due to concern for an acute stroke, patient was taken emergently at the CT suite.  CT head showed no evidence of acute abnormality however it showed small chronic right frontal and cerebellar infarcts.  Patient was last known well around 8:15 AM.  She was not given tPA, per neurology not a candidate.  Patient was seen by the neurology/stroke team with recommendation for MRI brain without contrast and to continue antiplatelet aspirin 81 mg daily.  MRI brain revealed probable punctate acute or early subacute infarct in the right parietal lobe.  Neurology recommended admission by hospitalist team for stroke work-up.  Interim history Admitted with Acute CVA and possible seizure. Pending work up. PT recommending CIR.  Assessment & Plan   Acute CVA    -?  Incidental finding -Patient tells me she has been having right sided weakness, chronic falls and dizziness -MRI brain showed acute or early subacute infarct in the right parietal lobe.  No significant edema or mass-effect.  Remote cerebellar lacunar infarcts, suspected small remote right frontal cortical infarct. Mild T2/FLAIR hyperintensities within the frontal predominant white matter likely  related to chronic microvascular ischemia chronic migraines -Echocardiogram shows an EF 60 to 65%, LVH, no regional wall motion abnormalities. -LDL 110, hemoglobin A1c 4.8 -Neurology consulted and appreciated -Not entirely convinced that patient's complaints are due to CVA.  Question seizure activity, EEG pending -Have ordered orthostatic vitals again -EEG pending -Lower extremity Doppler as well as carotid Doppler pending  -Currently on aspirin, statin, Plavix -Neurology recommending aspirin and Plavix together for 3 weeks followed by Plavix alone -PT recommending CIR; OT recommended intermittent supervision with no driving -CIR consulted however patient declined would like to go home -Pending further neurological recommendations  Essential hypertension -Given CVA, allowing for permissive hypertension  Chronic depression/anxiety -Continue Cymbalta, Symbyax, Ativan as needed  Chronic headache -Continue Ubrelvy as needed  GERD -Continue PPI  Obesity -BMI 35.19 -Patient follow-up with PCP to discuss lifestyle modifications  History of COVID-19 -stable  Visual hallucination -Patient had visual hallucination during my examination in the room -Stated that there was a man they are pulling her legs -Psychiatry consulted  DVT Prophylaxis Lovenox  Code Status: Full  Family Communication: Daughter at bedside  Disposition Plan:  Status is: Inpatient  Remains inpatient appropriate because:Ongoing diagnostic testing needed not appropriate for outpatient work up  Dispo:  Patient From: Home  Planned Disposition: Home  Medically stable for discharge: No     Consultants Neurology Psychiatry  Procedures  Echocardiogram Carotid Doppler Lower extremity Doppler EEG  Antibiotics   Anti-infectives (From admission, onward)    Start     Dose/Rate Route Frequency Ordered Stop   01/22/21 1030  valACYclovir (VALTREX) tablet 500 mg  500 mg Oral Daily 01/22/21 0932      01/21/21 1000  valACYclovir (VALTREX) tablet 500 mg  Status:  Discontinued        500 mg Oral Daily 01/20/21 2050 01/21/21 1229       Subjective:   Desiree Russell seen and examined today, eating breakfast.  Patient wanting to go home today.  Continues to complain of leg and hip pain.  States she has had several falls, one in the shower.  Denies current chest pain or shortness of breath, abdominal pain, nausea or vomiting, dizziness or headache.   Objective:   Vitals:   01/21/21 2336 01/22/21 0357 01/22/21 0745 01/22/21 1156  BP: 131/80 125/81 (!) 142/94 (!) 144/95  Pulse: 77 76 82 97  Resp: 13 14 16 15   Temp: 98.2 F (36.8 C) 98.2 F (36.8 C) (!) 97.4 F (36.3 C) 97.9 F (36.6 C)  TempSrc: Oral Oral Oral Oral  SpO2: 96% 96% 98% 97%  Weight:      Height:        Intake/Output Summary (Last 24 hours) at 01/22/2021 1405 Last data filed at 01/22/2021 0500 Gross per 24 hour  Intake 960 ml  Output --  Net 960 ml   Filed Weights   01/20/21 0958  Weight: 93 kg    Exam General: Well developed, well nourished, NAD, appears stated age HEENT: NCAT,  mucous membranes moist.  Cardiovascular: S1 S2 auscultated, RRR, no murmur Respiratory: Clear to auscultation bilaterally  Abdomen: Soft, nontender, nondistended, + bowel sounds Extremities: warm dry without cyanosis clubbing or edema Neuro: AAOx3, nonfocal Psych: visual hallucination during my examination today, otherwise pleasant   Data Reviewed: I have personally reviewed following labs and imaging studies  CBC: Recent Labs  Lab 01/20/21 0939 01/20/21 0947 01/21/21 0156  WBC 5.0  --  6.1  NEUTROABS 2.4  --   --   HGB 11.9* 13.3 10.9*  HCT 36.8 39.0 32.6*  MCV 92.2  --  91.1  PLT 214  --  937    Basic Metabolic Panel: Recent Labs  Lab 01/20/21 0939 01/20/21 0947 01/21/21 0156  NA 136 139 134*  K 4.4 4.4 3.7  CL 104 105 106  CO2 27  --  23  GLUCOSE 91 85 91  BUN 13 14 15   CREATININE 1.07* 1.00 0.94  CALCIUM  8.9  --  8.3*  MG  --   --  1.8  PHOS  --   --  3.4    GFR: Estimated Creatinine Clearance: 81.8 mL/min (by C-G formula based on SCr of 0.94 mg/dL). Liver Function Tests: Recent Labs  Lab 01/20/21 0939  AST 19  ALT 15  ALKPHOS 39  BILITOT 0.4  PROT 6.5  ALBUMIN 3.5    No results for input(s): LIPASE, AMYLASE in the last 168 hours. No results for input(s): AMMONIA in the last 168 hours. Coagulation Profile: Recent Labs  Lab 01/20/21 0939  INR 1.0    Cardiac Enzymes: No results for input(s): CKTOTAL, CKMB, CKMBINDEX, TROPONINI in the last 168 hours. BNP (last 3 results) No results for input(s): PROBNP in the last 8760 hours. HbA1C: Recent Labs    01/21/21 0156  HGBA1C 4.8    CBG: Recent Labs  Lab 01/20/21 0939 01/20/21 2156  GLUCAP 85 92    Lipid Profile: Recent Labs    01/21/21 0156  CHOL 175  HDL 40*  LDLCALC 110*  TRIG 127  CHOLHDL 4.4    Thyroid Function Tests: Recent Labs  01/21/21 1901  TSH 0.520   Anemia Panel: Recent Labs    01/21/21 1901  VITAMINB12 310   Urine analysis:    Component Value Date/Time   BILIRUBINUR neg 01/11/2021 0839   PROTEINUR Negative 01/11/2021 0839   UROBILINOGEN 0.2 01/11/2021 0839   NITRITE neg 01/11/2021 0839   LEUKOCYTESUR Negative 01/11/2021 0839   Sepsis Labs: @LABRCNTIP (procalcitonin:4,lacticidven:4)  ) Recent Results (from the past 240 hour(s))  Resp Panel by RT-PCR (Flu A&B, Covid) Nasopharyngeal Swab     Status: None   Collection Time: 01/20/21  9:32 AM   Specimen: Nasopharyngeal Swab; Nasopharyngeal(NP) swabs in vial transport medium  Result Value Ref Range Status   SARS Coronavirus 2 by RT PCR NEGATIVE NEGATIVE Final    Comment: (NOTE) SARS-CoV-2 target nucleic acids are NOT DETECTED.  The SARS-CoV-2 RNA is generally detectable in upper respiratory specimens during the acute phase of infection. The lowest concentration of SARS-CoV-2 viral copies this assay can detect is 138  copies/mL. A negative result does not preclude SARS-Cov-2 infection and should not be used as the sole basis for treatment or other patient management decisions. A negative result may occur with  improper specimen collection/handling, submission of specimen other than nasopharyngeal swab, presence of viral mutation(s) within the areas targeted by this assay, and inadequate number of viral copies(<138 copies/mL). A negative result must be combined with clinical observations, patient history, and epidemiological information. The expected result is Negative.  Fact Sheet for Patients:  EntrepreneurPulse.com.au  Fact Sheet for Healthcare Providers:  IncredibleEmployment.be  This test is no t yet approved or cleared by the [place] FDA and  has been authorized for detection and/or diagnosis of SARS-CoV-2 by FDA under an Emergency Use Authorization (EUA). This EUA will remain  in effect (meaning this test can be used) for the duration of the COVID-19 declaration under Section 564(b)(1) of the Act, 21 U.S.C.section 360bbb-3(b)(1), unless the authorization is terminated  or revoked sooner.       Influenza A by PCR NEGATIVE NEGATIVE Final   Influenza B by PCR NEGATIVE NEGATIVE Final    Comment: (NOTE) The Xpert Xpress SARS-CoV-2/FLU/RSV plus assay is intended as an aid in the diagnosis of influenza from Nasopharyngeal swab specimens and should not be used as a sole basis for treatment. Nasal washings and aspirates are unacceptable for Xpert Xpress SARS-CoV-2/FLU/RSV testing.  Fact Sheet for Patients: EntrepreneurPulse.com.au  Fact Sheet for Healthcare Providers: IncredibleEmployment.be  This test is not yet approved or cleared by the [place] FDA and has been authorized for detection and/or diagnosis of SARS-CoV-2 by FDA under an Emergency Use Authorization (EUA). This EUA will remain in effect (meaning  this test can be used) for the duration of the COVID-19 declaration under Section 564(b)(1) of the Act, 21 U.S.C. section 360bbb-3(b)(1), unless the authorization is terminated or revoked.  Performed at [facility], [place] [address] [place], [place] [address]       Radiology Studies: CT ANGIO HEAD NECK W WO CM  Result Date: 01/21/2021 CLINICAL DATA:  Stroke follow-up. EXAM: CT ANGIOGRAPHY HEAD AND NECK TECHNIQUE: Multidetector CT imaging of the head and neck was performed using the standard protocol during bolus administration of intravenous contrast. Multiplanar CT image reconstructions and MIPs were obtained to evaluate the vascular anatomy. Carotid stenosis measurements (when applicable) are obtained utilizing NASCET criteria, using the distal internal carotid diameter as the denominator. CONTRAST:  140mL OMNIPAQUE IOHEXOL 350 MG/ML SOLN COMPARISON:  MRI and CT January 20, 2021. FINDINGS: CT  HEAD FINDINGS Brain: No evidence of acute large vascular territory infarction, hemorrhage, hydrocephalus, extra-axial collection or mass lesion/mass effect. Probable punctate acute right parietal infarct seen on recent MRI is not visible by CT. Vascular: See below. Skull: No acute fracture. Sinuses: The visualized sinuses are clear. Orbits: No acute findings in the visualized orbits. Review of the MIP images confirms the above findings CTA NECK FINDINGS Aortic arch: Great vessel origins are patent. Right carotid system: Motion limited evaluation without visible hemodynamically significant (greater than 50%). Poorly visualized carotid bifurcation without definite hemodynamically significant stenosis. Left carotid system: Limited evaluation due to patient motion with suspected mild to moderate stenosis of the left ICA at C1-C2. Poorly visualized carotid bifurcation without definite hemodynamically significant stenosis. Vertebral arteries: Bilateral vertebral arteries are small and poorly evaluated due to  artifact on this study. Nondiagnostic evaluation of the proximal left vertebral artery due to superimposed streak artifact from pooled venous contrast. The vertebral arteries are poorly visualized at C4-C5, which may be in part artifactual given artifact in this region; however, stenosis is not excluded. Skeleton: Saturated cervical kyphosis. Mild-to-moderate degenerative disc disease at C6-C7. Other neck: No evidence of acute abnormality. Upper chest: Visualized lung apices are clear. Review of the MIP images confirms the above findings CTA HEAD FINDINGS Anterior circulation: Bilateral intracranial ICAs, MCAs, and ACAs are patent. 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 Otherwise, no evidence of proximal hemodynamically significant stenosis. No aneurysm. Posterior circulation: Small vertebrobasilar system with bilateral fetal type PCAs, anatomic variant. Moderate right intradural vertebral artery narrowing. The distal left vertebral artery is very small and poorly visualized, which could represent anatomic variation with the left vertebral artery predominantly terminating as PICA or a superimposed stenosis. Bilateral PCAs are patent without evidence of proximal hemodynamically significant stenosis. Limited evaluation of the distal PCAs due to venous contamination. Venous sinuses: As permitted by contrast timing, patent. Rounded filling defects in the superior sagittal sinus likely represent arachnoid granulations. Anatomic variants: Detailed above. Review of the MIP images confirms the above findings IMPRESSION: CT head: No evidence of acute intracranial abnormality. Probable punctate acute right parietal infarct seen on recent MRI is not visible by CT. CTA Head: 1. Very small vertebrobasilar system with bilateral fetal type PCAs, anatomic variant. Moderate right intradural vertebral artery narrowing. The distal left vertebral artery is very small and poorly visualized, which could represent anatomic  variation with the left vertebral artery predominantly terminating as PICA or a superimposed stenosis. The basilar artery is small throughout its course. 2. Otherwise, no large vessel occlusion or proximal hemodynamically significant stenosis. CTA Neck: 1. Significantly limited evaluation due to patient motion. 2. Suspected mild to moderate stenosis of the left ICA at C1-C2. 3. Vertebral arteries are small poorly evaluated due to their small size and motion artifact. The vertebral arteries appear poorly opacified at C4-C5, which may be in part artifactual given artifact in this region; however, stenosis is not excluded. A repeat CTA could further characterize if clinically indicated. Electronically Signed   By: Margaretha Sheffield MD   On: 01/21/2021 15:12   MR BRAIN WO CONTRAST  Result Date: 01/20/2021 CLINICAL DATA:  Neuro deficit, acute stroke suspected. EXAM: MRI HEAD WITHOUT CONTRAST TECHNIQUE: Multiplanar, multiecho pulse sequences of the brain and surrounding structures were obtained without intravenous contrast. COMPARISON:  Same day CT code stroke.  MRI [id number]. FINDINGS: Brain: Probable punctate acute or early subacute infarct in the right parietal lobe (series 3, image 27). No substantial edema or mass effect. Remote left  greater than right cerebellar lacunar infarcts. Suspected small remote right frontal cortical infarct (series 7, image 19) no hydrocephalus. No acute hemorrhage. No extra-axial fluid collection. No midline shift. Basal cisterns are patent. Mild T2/FLAIR hyperintensities within the frontal predominant white matter. Vascular: Major arterial flow voids are maintained at the skull base. Skull and upper cervical spine: Normal marrow signal. Sinuses/Orbits: Largely clear sinuses.  Unremarkable orbits. Other: No mastoid effusions. IMPRESSION: 1. Probable punctate acute or early subacute infarct in the right parietal lobe (series 3, image 27). No significant edema or mass effect. 2. Remote  cerebellar lacunar infarcts and suspected small remote right frontal cortical infarct. 3. Mild T2/FLAIR hyperintensities within the frontal predominant white matter, most likely related to chronic microvascular ischemia or chronic migraines. Electronically Signed   By: Margaretha Sheffield MD   On: 01/20/2021 18:29   Overnight EEG with video  Result Date: 01/22/2021 Lora Havens, MD     01/22/2021  8:35 AM Patient Name: Desiree Russell MRN: [id number] Epilepsy Attending: Lora Havens Referring Physician/Provider: Dr Roland Rack Duration: 01/21/2021 1026 to 01/22/2021 0830 Patient history: 49yo F with transient alteration of awareness. EEG to evaluate for seizure Level of alertness: Awake, asleep AEDs during EEG study: GBP Technical aspects: This EEG study was done with scalp electrodes positioned according to the 10-20 International system of electrode placement. Electrical activity was acquired at a sampling rate of 500Hz  and reviewed with a high frequency filter of 70Hz  and a low frequency filter of 1Hz . EEG data were recorded continuously and digitally stored. Description: The posterior dominant rhythm consists of 9-10 Hz activity of moderate voltage (25-35 uV) seen predominantly in posterior head regions, symmetric and reactive to eye opening and eye closing. Sleep was characterized by vertex waves, sleep spindles (12 to 14 Hz), maximal frontocentral region. Hyperventilation and photic stimulation were not performed.   IMPRESSION: This study is within normal limits. No seizures or epileptiform discharges were seen throughout the recording.  Lora Havens   ECHOCARDIOGRAM COMPLETE  Result Date: 01/21/2021    ECHOCARDIOGRAM REPORT   Patient Name:   Desiree Russell Date of Exam: 01/21/2021 Medical Rec #:  [id number]    Height:       64.0 in Accession #:    [id number]   Weight:       205.0 lb Date of Birth:  01/28/1971   BSA:          1.977 m Patient Age:    51 years     BP:           133/90 mmHg  Patient Gender: F            HR:           85 bpm. Exam Location:  Inpatient Procedure: 2D Echo, Cardiac Doppler and Color Doppler Indications:    Stroke  History:        Patient has no prior history of Echocardiogram examinations.                 Risk Factors:Hypertension and Dyslipidemia. H/o CVA. H/o                 COVID-19 infection.  Sonographer:    Clayton Lefort RDCS (AE) Referring Phys: [id number] [place] METZGER-CIHEL[place]  Sonographer Comments: Suboptimal subcostal window. IMPRESSIONS  1. Left ventricular ejection fraction, by estimation, is 60 to 65%. The left ventricle has normal function. The left ventricle has no regional wall motion abnormalities. There is moderate left ventricular hypertrophy.  Left ventricular diastolic parameters were normal.  2. Right ventricular systolic function is normal. The right ventricular size is normal. There is normal pulmonary artery systolic pressure. The estimated right ventricular systolic pressure is 00.8 mmHg.  3. Left atrial size was mildly dilated.  4. The mitral valve is abnormal. Eccentric posterior directed jet. Mild to moderate mitral valve regurgitation. No evidence of mitral stenosis.  5. The aortic valve is tricuspid. Aortic valve regurgitation is not visualized. Mild aortic valve sclerosis is present, with no evidence of aortic valve stenosis.  6. The inferior vena cava is normal in size with <50% respiratory variability, suggesting right atrial pressure of 8 mmHg. FINDINGS  Left Ventricle: Left ventricular ejection fraction, by estimation, is 60 to 65%. The left ventricle has normal function. The left ventricle has no regional wall motion abnormalities. The left ventricular internal cavity size was normal in size. There is  moderate left ventricular hypertrophy. Left ventricular diastolic parameters were normal. Right Ventricle: The right ventricular size is normal. No increase in right ventricular wall thickness. Right ventricular systolic function is normal.  There is normal pulmonary artery systolic pressure. The tricuspid regurgitant velocity is 2.08 m/s, and  with an assumed right atrial pressure of 8 mmHg, the estimated right ventricular systolic pressure is 67.6 mmHg. Left Atrium: Left atrial size was mildly dilated. Right Atrium: Right atrial size was normal in size. Pericardium: Trivial pericardial effusion is present. Mitral Valve: The mitral valve is abnormal. Mild to moderate mitral valve regurgitation. No evidence of mitral valve stenosis. MV peak gradient, 6.2 mmHg. The mean mitral valve gradient is 2.0 mmHg. Tricuspid Valve: The tricuspid valve is normal in structure. Tricuspid valve regurgitation is trivial. Aortic Valve: The aortic valve is tricuspid. Aortic valve regurgitation is not visualized. Mild aortic valve sclerosis is present, with no evidence of aortic valve stenosis. Aortic valve mean gradient measures 5.0 mmHg. Aortic valve peak gradient measures 10.0 mmHg. Aortic valve area, by VTI measures 1.77 cm. Pulmonic Valve: The pulmonic valve was not well visualized. Pulmonic valve regurgitation is not visualized. Aorta: The aortic root and ascending aorta are structurally normal, with no evidence of dilitation. Venous: The inferior vena cava is normal in size with less than 50% respiratory variability, suggesting right atrial pressure of 8 mmHg. IAS/Shunts: The interatrial septum was not well visualized.  LEFT VENTRICLE PLAX 2D LVIDd:         5.00 cm  Diastology LVIDs:         3.00 cm  LV e' medial:    9.90 cm/s LV PW:         1.30 cm  LV E/e' medial:  9.1 LV IVS:        1.00 cm  LV e' lateral:   15.80 cm/s LVOT diam:     2.00 cm  LV E/e' lateral: 5.7 LV SV:         52 LV SV Index:   27 LVOT Area:     3.14 cm  RIGHT VENTRICLE             IVC RV Basal diam:  2.60 cm     IVC diam: 1.20 cm RV S prime:     10.80 cm/s TAPSE (M-mode): 2.4 cm LEFT ATRIUM             Index       RIGHT ATRIUM           Index LA diam:        2.60 cm 1.32  cm/m  RA Area:      12.40 cm LA Vol (A2C):   63.4 ml 32.07 ml/m RA Volume:   26.00 ml  13.15 ml/m LA Vol (A4C):   70.8 ml 35.81 ml/m LA Biplane Vol: 69.1 ml 34.95 ml/m  AORTIC VALVE AV Area (Vmax):    1.78 cm AV Area (Vmean):   1.77 cm AV Area (VTI):     1.77 cm AV Vmax:           158.00 cm/s AV Vmean:          104.000 cm/s AV VTI:            0.296 m AV Peak Grad:      10.0 mmHg AV Mean Grad:      5.0 mmHg LVOT Vmax:         89.60 cm/s LVOT Vmean:        58.700 cm/s LVOT VTI:          0.167 m LVOT/AV VTI ratio: 0.56  AORTA Ao Root diam: 2.90 cm Ao Asc diam:  2.60 cm MITRAL VALVE                 TRICUSPID VALVE MV Area (PHT): 3.31 cm      TR Peak grad:   17.3 mmHg MV Area VTI:   1.83 cm      TR Vmax:        208.00 cm/s MV Peak grad:  6.2 mmHg MV Mean grad:  2.0 mmHg      SHUNTS MV Vmax:       1.25 m/s      Systemic VTI:  0.17 m MV Vmean:      71.3 cm/s     Systemic Diam: 2.00 cm MV Decel Time: 229 msec MR Peak grad:    105.7 mmHg MR Mean grad:    75.0 mmHg MR Vmax:         514.00 cm/s MR Vmean:        411.0 cm/s MR PISA:         1.57 cm MR PISA Eff ROA: 12 mm MR PISA Radius:  0.50 cm MV E velocity: 89.60 cm/s MV A velocity: 91.20 cm/s MV E/A ratio:  0.98 Oswaldo Milian MD Electronically signed by Oswaldo Milian MD Signature Date/Time: 01/21/2021/12:48:22 PM    Final      Scheduled Meds:  aspirin EC  81 mg Oral Daily   atorvastatin  40 mg Oral Daily   clopidogrel  75 mg Oral Daily   diclofenac Sodium  4 g Topical QID   DULoxetine  30 mg Oral BID   enoxaparin (LOVENOX) injection  40 mg Subcutaneous Q24H   gabapentin  200 mg Oral QHS   OLANZapine-FLUoxetine  1 capsule Oral QHS   pantoprazole  40 mg Oral Daily   senna-docusate  1 tablet Oral QHS   sodium chloride flush  3 mL Intravenous Once   valACYclovir  500 mg Oral Daily   Continuous Infusions:     LOS: 2 days   Time Spent in minutes   45 minutes  Sayan Aldava D.O. on 01/22/2021 at 2:05 PM  Between 7am to 7pm - Please see pager noted  on [email or web address]  After 7pm go to [email or web address]  And look for the night coverage person covering for me after hours  [facility] Office  [phone]
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
## 2021-01-22 NOTE — Progress Notes (Signed)
Pt found on the floor with unwitnessed fall after getting up from using the toilet. Pt daughter 'heard a sound' and found patient on the ground. This RN came into room after bathroom call bell went off. Pt not able to answer orientation questions, attempted and failed to form words. Pt on continuous EEG, event button pressed. Dr. Ree Kida informed of situation. Pt c/o of R hip pain, lower L back pain and R shoulder pain from fall. Pt given voltaren gel in affected areas. Dr. Ree Kida to order x-rays. Pt placed back in bed, bed alarm on, call bell within reach.

## 2021-01-22 NOTE — Progress Notes (Addendum)
TRH night shift PCU coverage note.  The nursing staff reported that the patient is having right shoulder pain.  She had an unwitnessed fall in the bathroom during dayshift hitting her right hip and shoulder.  The patient is requesting something for pain at this time.  Fentanyl 25 mcg IVP x1 dose ordered.  I asked radiology to obtain a portable right shoulder x-ray which did not show any evidence of fracture, dislocation, arthropathy or other focal bone abnormality.  The soft tissues were unremarkable.Tennis Must, MD

## 2021-01-22 NOTE — Consult Note (Signed)
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 female who report medical history significant for previous CVA, hypertension, chronic anxiety/depression, fibromyalgia, PTSD, hyperlipidemia, positive COVID-19 screening test 2 months ago, pseudoseizures, who was admitted due to stroke. However, psych consult was activated due to hallucinations. Patient reports long history of anxiety, depression, PTSD but states that she started having hallucinations after she had stroke in 2016. She reports visual hallucination whence she sees the shadow of a female figure often stand by her bedside, by the door having conversation with her. Patient reports that she has been receiving medications from [facility] outpatient prescribed by Dr. Adele Schilder that helps with her symptoms. She also reports seeing Sanjuana Kava for counseling. She states that Dr. Adele Schilder has been working with her to adjust her medications to address her symptoms and has a scheduled appointment with him prior to current hospital admission which she will like to keep. Today, patient denies depressive symptoms and self harming thoughts. She is requesting to be put back on her home medications.  Past Psychiatric History: as above  Risk to Self:  denies Risk to Others:  denies Prior Inpatient Therapy:  none Prior Outpatient Therapy:  Dr. Adele Schilder at [facility] outpatient clinic  Past Medical History:  Past Medical History:  Diagnosis Date   Anxiety    Dyslipidemia     Fibromyalgia    Headache    Pericarditis    Seizures (HCC)    Tachycardia    TIA (transient ischemic attack)    Recurrent    Past Surgical History:  Procedure Laterality Date   ABDOMINAL HYSTERECTOMY     CHOLECYSTECTOMY, LAPAROSCOPIC     Family History:  Family History  Problem Relation Age of Onset   Hypertension Mother    Hypertension Father    Heart attack Father 51   Healthy Sister    Diabetes type II Brother    Hypertension Maternal Grandmother    Diabetes Paternal Grandmother    Healthy Sister    Healthy Sister    COPD Brother    Healthy Brother    Healthy Brother    Family Psychiatric  History:  Social History:  Social History   Substance and Sexual Activity  Alcohol Use Yes   Comment: occ      Social History   Substance and Sexual Activity  Drug Use No    Social History   Socioeconomic History   Marital status: Married    Spouse name: Not on file   Number of children: Not on file   Years of education: Not on file   Highest education level: Not on file  Occupational History   Not on file  Tobacco Use   Smoking status: Never   Smokeless tobacco: Never  Substance and Sexual Activity   Alcohol use: Yes    Comment: occ    Drug use: No   Sexual activity: Not on file  Other Topics Concern   Not on file  Social History Narrative   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  Social Determinants of Health   Financial Resource Strain: Not on file  Food Insecurity: Not on file  Transportation Needs: Not on file  Physical Activity: Not on file  Stress: Not on file  Social Connections: Not on file   Additional Social History:    Allergies:   Allergies  Allergen Reactions   Codeine Hives and Rash   Morphine Hives and Other (See Comments)    Headache   Morphine And Related Hives   Oxycodone Hives   Chlorhexidine Hives and Rash         Labs:  Results for orders placed or performed during the hospital encounter of  01/20/21 (from the past 48 hour(s))  CBG monitoring, ED     Status: None   Collection Time: 01/20/21  9:56 PM  Result Value Ref Range   Glucose-Capillary 92 70 - 99 mg/dL    Comment: Glucose reference range applies only to samples taken after fasting for at least 8 hours.  Hemoglobin A1c     Status: None   Collection Time: 01/21/21  1:56 AM  Result Value Ref Range   Hgb A1c MFr Bld 4.8 4.8 - 5.6 %    Comment: (NOTE) Pre diabetes:          5.7%-6.4%  Diabetes:              >6.4%  Glycemic control for   <7.0% adults with diabetes    Mean Plasma Glucose 91.06 mg/dL    Comment: Performed at [facility] [address], [place], [place] [address]  Lipid panel     Status: Abnormal   Collection Time: 01/21/21  1:56 AM  Result Value Ref Range   Cholesterol 175 0 - 200 mg/dL   Triglycerides 127 <150 mg/dL   HDL 40 (L) >40 mg/dL   Total CHOL/HDL Ratio 4.4 RATIO   VLDL 25 0 - 40 mg/dL   LDL Cholesterol 110 (H) 0 - 99 mg/dL    Comment:        Total Cholesterol/HDL:CHD Risk Coronary Heart Disease Risk Table                     Men   Women  1/2 Average Risk   3.4   3.3  Average Risk       5.0   4.4  2 X Average Risk   9.6   7.1  3 X Average Risk  23.4   11.0        Use the calculated Patient Ratio above and the CHD Risk Table to determine the patient's CHD Risk.        ATP III CLASSIFICATION (LDL):  <100     mg/dL   Optimal  100-129  mg/dL   Near or Above                    Optimal  130-159  mg/dL   Borderline  160-189  mg/dL   High  >190     mg/dL   Very High Performed at [facility] [address], [place] [address]   CBC     Status: Abnormal   Collection Time: 01/21/21  1:56 AM  Result Value Ref Range   WBC 6.1 4.0 - 10.5 K/uL   RBC 3.58 (L) 3.87 - 5.11 MIL/uL   Hemoglobin 10.9 (L) 12.0 - 15.0 g/dL   HCT 32.6 (L) 36.0 - 46.0 %   MCV 91.1 80.0 - 100.0  fL   MCH 30.4 26.0 - 34.0 pg   MCHC 33.4 30.0 - 36.0 g/dL   RDW 13.6 11.5 - 15.5 %    Platelets 221 150 - 400 K/uL   nRBC 0.0 0.0 - 0.2 %    Comment: Performed at [facility], [place] [address] [place], [place] [address]  Basic metabolic panel     Status: Abnormal   Collection Time: 01/21/21  1:56 AM  Result Value Ref Range   Sodium 134 (L) 135 - 145 mmol/L   Potassium 3.7 3.5 - 5.1 mmol/L   Chloride 106 98 - 111 mmol/L   CO2 23 22 - 32 mmol/L   Glucose, Bld 91 70 - 99 mg/dL    Comment: Glucose reference range applies only to samples taken after fasting for at least 8 hours.   BUN 15 6 - 20 mg/dL   Creatinine, Ser 0.94 0.44 - 1.00 mg/dL   Calcium 8.3 (L) 8.9 - 10.3 mg/dL   GFR, Estimated >60 >60 mL/min    Comment: (NOTE) Calculated using the CKD-EPI Creatinine Equation (2021)    Anion gap 5 5 - 15    Comment: Performed at [facility] [address], [place], [place] [address]  Magnesium     Status: None   Collection Time: 01/21/21  1:56 AM  Result Value Ref Range   Magnesium 1.8 1.7 - 2.4 mg/dL    Comment: Performed at [facility] [address], [place], [place] [address]  Phosphorus     Status: None   Collection Time: 01/21/21  1:56 AM  Result Value Ref Range   Phosphorus 3.4 2.5 - 4.6 mg/dL    Comment: Performed at [facility] [address] [place], [place] [address]  Rapid urine drug screen (hospital performed)     Status: None   Collection Time: 01/21/21  6:20 PM  Result Value Ref Range   Opiates NONE DETECTED NONE DETECTED   Cocaine NONE DETECTED NONE DETECTED   Benzodiazepines NONE DETECTED NONE DETECTED   Amphetamines NONE DETECTED NONE DETECTED   Tetrahydrocannabinol NONE DETECTED NONE DETECTED   Barbiturates NONE DETECTED NONE DETECTED    Comment: (NOTE) DRUG SCREEN FOR MEDICAL PURPOSES ONLY.  IF CONFIRMATION IS NEEDED FOR ANY PURPOSE, NOTIFY LAB WITHIN 5 DAYS.  LOWEST DETECTABLE LIMITS FOR URINE DRUG SCREEN Drug Class                     Cutoff (ng/mL) Amphetamine and metabolites    1000 Barbiturate and  metabolites    200 Benzodiazepine                 638 Tricyclics and metabolites     300 Opiates and metabolites        300 Cocaine and metabolites        300 THC                            50 Performed at [facility], [place] [address] [place], [place] [address]   TSH     Status: None   Collection Time: 01/21/21  7:01 PM  Result Value Ref Range   TSH 0.520 0.350 - 4.500 uIU/mL    Comment: Performed by a 3rd Generation assay with a functional sensitivity of <=0.01 uIU/mL. Performed at [facility], [place] [address] [place], [place] [address]   RPR  Status: None   Collection Time: 01/21/21  7:01 PM  Result Value Ref Range   RPR Ser Ql NON REACTIVE NON REACTIVE    Comment: Performed at [facility] [address] [place], [place] [address]  Vitamin B12     Status: None   Collection Time: 01/21/21  7:01 PM  Result Value Ref Range   Vitamin B-12 310 180 - 914 pg/mL    Comment: (NOTE) This assay is not validated for testing neonatal or myeloproliferative syndrome specimens for Vitamin B12 levels. Performed at [facility], [place] [address] [place], [place] [address]     Current Facility-Administered Medications  Medication Dose Route Frequency Provider Last Rate Last Admin   acetaminophen (TYLENOL) tablet 650 mg  650 mg Oral Q6H PRN Kayleen Memos, DO   650 mg at 01/22/21 1275   ALPRAZolam Duanne Moron) tablet 0.5 mg  0.5 mg Oral BID PRN Irene Pap N, DO   0.5 mg at 01/21/21 0142   aspirin EC tablet 81 mg  81 mg Oral Daily Kayleen Memos, DO   81 mg at 01/22/21 1700   atorvastatin (LIPITOR) tablet 40 mg  40 mg Oral Daily Metzger-Cihelka, Desiree, NP   40 mg at 01/22/21 0817   clopidogrel (PLAVIX) tablet 75 mg  75 mg Oral Daily Metzger-Cihelka, Desiree, NP   75 mg at 01/22/21 1749   diclofenac Sodium (VOLTAREN) 1 % topical gel 4 g  4 g Topical QID Cristal Ford, DO   4 g at 01/22/21 1025   DULoxetine (CYMBALTA) DR capsule 30 mg  30 mg Oral BID Irene Pap  N, DO   30 mg at 01/22/21 0817   enoxaparin (LOVENOX) injection 40 mg  40 mg Subcutaneous Q24H Irene Pap N, DO   40 mg at 01/21/21 2224   gabapentin (NEURONTIN) capsule 200 mg  200 mg Oral QHS Mikhail, Velta Addison, DO   200 mg at 01/21/21 2224   labetalol (NORMODYNE) injection 5 mg  5 mg Intravenous Q2H PRN Irene Pap N, DO       LORazepam (ATIVAN) injection 2 mg  2 mg Intravenous Q6H PRN Hall, Carole N, DO       melatonin tablet 3 mg  3 mg Oral QHS PRN Irene Pap N, DO       OLANZapine-FLUoxetine (SYMBYAX) 6-25 MG per capsule 1 capsule  1 capsule Oral QHS Mikhail, [place], DO   1 capsule at 01/21/21 2225   ondansetron (ZOFRAN) injection 4 mg  4 mg Intravenous Q6H PRN Irene Pap N, DO       pantoprazole (PROTONIX) EC tablet 40 mg  40 mg Oral Daily Irene Pap N, DO   40 mg at 01/22/21 0817   polyethylene glycol (MIRALAX / GLYCOLAX) packet 17 g  17 g Oral Daily PRN Kayleen Memos, DO       senna-docusate (Senokot-S) tablet 1 tablet  1 tablet Oral QHS Cristal Ford, DO   1 tablet at 01/21/21 2224   sodium chloride flush (NS) 0.9 % injection 3 mL  3 mL Intravenous Once Sherwood Gambler, MD       Ubrogepant TABS 100 mg  100 mg Oral PRN Irene Pap N, DO       valACYclovir (VALTREX) tablet 500 mg  500 mg Oral Daily Cristal Ford, DO   500 mg at 01/22/21 1214    Musculoskeletal: Strength & Muscle Tone:  not tested Gait & Station: unable to stand Patient leans: N/A    Psychiatric  Specialty Exam:  Presentation  General Appearance: Appropriate for Environment  Eye Contact:Good  Speech:Clear and Coherent  Speech Volume:Normal  Handedness:Right   Mood and Affect  Mood:Euthymic  Affect:Congruent   Thought Process  Thought Processes:Linear; Goal Directed  Descriptions of Associations:Intact  Orientation:Full (Time, Place and Person)  Thought Content:Logical  History of Schizophrenia/Schizoaffective disorder:No  Duration of Psychotic Symptoms:Greater than six  months  Hallucinations:Hallucinations: Visual Description of Visual Hallucinations: seeing shadow of a man in her room  Ideas of Reference:None  Suicidal Thoughts:Suicidal Thoughts: No  Homicidal Thoughts:Homicidal Thoughts: No   Sensorium  Memory:Immediate Good; Recent Good; Remote Good  Judgment:Good  Insight:Good   Executive Functions  Concentration:Good  Attention Span:Good  [place] of Knowledge:Good  Language:Good   Psychomotor Activity  Psychomotor Activity:Psychomotor Activity: Psychomotor Retardation   Assets  Assets:Communication Skills; Desire for Improvement   Sleep  Sleep:Sleep: Fair   Physical Exam: Physical Exam Psychiatric:        Attention and Perception: Attention normal. She perceives visual hallucinations.        Mood and Affect: Mood normal.        Speech: Speech normal.        Behavior: Behavior normal. Behavior is cooperative.        Thought Content: Thought content normal.        Cognition and Memory: Cognition normal.        Judgment: Judgment normal.   ROS Blood pressure (!) 144/95, pulse 97, temperature 97.9 F (36.6 C), temperature source Oral, resp. rate 15, height 5\' 4"  (1.626 m), weight 93 kg, SpO2 97 %. Body mass index is 35.19 kg/m.  Treatment Plan Summary: 50 year old female with multiple medical issues, MDD, Anxiety, PTSD who was admitted to the hospital due to stroke but reports visual hallucinations-seeing the shadow of a man in her room. Patient is requesting to be back on her current medications and wants her psychiatrist to adjust her medications during her next appointment in few weeks.  Recommendations: -Continue Cymbalta 30 mg twice daily for anxiety/depression -Continue Symbyax 6-25 mg daily for hallucinations -Social worker to refer patient to [facility] for outpatient medication management post hospital discharge.  Disposition: No evidence of imminent risk to self or others at present.   Patient  does not meet criteria for psychiatric inpatient admission. Supportive therapy provided about ongoing stressors. Psychiatric service signing out. Re-consult as needed  Corena Pilgrim, MD 01/22/2021 2:50 PM
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
## 2021-01-23 ENCOUNTER — Inpatient Hospital Stay (HOSPITAL_COMMUNITY): Payer: 59

## 2021-01-23 ENCOUNTER — Other Ambulatory Visit (HOSPITAL_COMMUNITY): Payer: Self-pay

## 2021-01-23 DIAGNOSIS — E669 Obesity, unspecified: Secondary | ICD-10-CM

## 2021-01-23 DIAGNOSIS — R440 Auditory hallucinations: Secondary | ICD-10-CM

## 2021-01-23 DIAGNOSIS — I6781 Acute cerebrovascular insufficiency: Secondary | ICD-10-CM

## 2021-01-23 DIAGNOSIS — I639 Cerebral infarction, unspecified: Secondary | ICD-10-CM

## 2021-01-23 DIAGNOSIS — R519 Headache, unspecified: Secondary | ICD-10-CM

## 2021-01-23 DIAGNOSIS — Z8616 Personal history of COVID-19: Secondary | ICD-10-CM

## 2021-01-23 DIAGNOSIS — K219 Gastro-esophageal reflux disease without esophagitis: Secondary | ICD-10-CM

## 2021-01-23 DIAGNOSIS — F418 Other specified anxiety disorders: Secondary | ICD-10-CM

## 2021-01-23 LAB — BETA-2-GLYCOPROTEIN I ABS, IGG/M/A
Beta-2 Glyco I IgG: <9 GPI IgG units (ref 0–20)
Beta-2-Glycoprotein I IgA: <9 GPI IgA units (ref 0–25)
Beta-2-Glycoprotein I IgM: <9 GPI IgM units (ref 0–32)

## 2021-01-23 LAB — HOMOCYSTEINE: Homocysteine: 10.4 umol/L (ref 0.0–14.5)

## 2021-01-23 MED ORDER — ASPIRIN 81 MG PO TBEC
81.0000 mg | DELAYED_RELEASE_TABLET | Freq: Every day | ORAL | 0 refills | Status: AC
  Filled 2021-01-23: qty 21, 21d supply, fill #0

## 2021-01-23 MED ORDER — CLOPIDOGREL BISULFATE 75 MG PO TABS
75.0000 mg | ORAL_TABLET | Freq: Every day | ORAL | 1 refills | Status: DC
  Filled 2021-01-23: qty 30, 30d supply, fill #0
  Filled 2021-02-15: qty 30, 30d supply, fill #1

## 2021-01-23 MED ORDER — PHENYLEPHRINE HCL-NACL 10-0.9 MG/250ML-% IV SOLN
INTRAVENOUS | Status: AC
  Filled 2021-01-23: qty 250

## 2021-01-23 MED ORDER — ATORVASTATIN CALCIUM 40 MG PO TABS
40.0000 mg | ORAL_TABLET | Freq: Every day | ORAL | 1 refills | Status: DC
  Filled 2021-01-23: qty 30, 30d supply, fill #0
  Filled 2021-02-15: qty 30, 30d supply, fill #1

## 2021-01-23 NOTE — Progress Notes (Signed)
Desiree Russell to be D/C'd Home per MD order.  Discussed with the patient and all questions fully answered.  VSS, Skin clean, dry and intact without evidence of skin break down, no evidence of skin tears noted. IV catheter discontinued intact. Site without signs and symptoms of complications. Dressing and pressure applied.  An After Visit Summary was printed and given to the patient. Patient received prescription.  D/c education completed with patient/family including follow up instructions, medication list, d/c activities limitations if indicated, with other d/c instructions as indicated by MD - patient able to verbalize understanding, all questions fully answered.   Patient instructed to return to ED, call 911, or call MD for any changes in condition.   Patient escorted via [place], and D/C home via private auto.  [name] 01/23/2021 6:43 PM

## 2021-01-23 NOTE — Progress Notes (Signed)
TCD with bubbles, and carotid artery duplex have been completed. Preliminary results can be found in CV Proc through chart review.   01/23/21 1:46 PM Desiree Russell RVT

## 2021-01-23 NOTE — Progress Notes (Signed)
LTM EEG complete. No skin breakdown °

## 2021-01-23 NOTE — Discharge Instructions (Signed)
DO NOT DRIVE FOR 6 MONTHS or until cleared by your primary care provider/neurologist.

## 2021-01-23 NOTE — Progress Notes (Signed)
Physical Therapy Treatment Patient Details Name: Desiree Russell MRN: [id number] DOB: [birth year] Today's Date: 01/23/2021    History of Present Illness Pt is 50yo female admitted 01/20/21 via EMS as code stroke due to having episode of R weakness/L facial droop at work. Had an unresponsive episode with admitting MD. Admitted for work up.  Pt found to have R punctate parietotemporal infarct that was not consistent with symptoms.  EEG , transcranial doppler, and carotid U/S all unremarkable.  Pt with c/o visual/auditory hallucinations.  Pt had psychatry consult, PMH anxiety, HLD, fibromyalgia, seizure, TIA    PT Comments    Pt making good progress today. She was able to transfer, ambulate, and perform stairs with supervision.  She reports history of dizziness that sometimes occurs with rolling/head turns.  Pt was symptomatic for posterior canal BPPV on L but no nystagmus - performed Epley's maneuver and gave HEP.  She had no episodes of buckling or unresponsiveness during session and all VSS.  She reports that R sided weakness is long standing ~2 years.  She reports having theraband and exercising daily at home.  Discussed if she did not feel she was returning to baseline on her own could request outpt PT from her PCP - she agreed.     Follow Up Recommendations  Outpatient PT (if desired by pt, educated could request order from PCP if not improving on her own)     Equipment Recommendations  None recommended by PT    Recommendations for Other Services       Precautions / Restrictions Precautions Precautions: Fall Precaution Comments: hx of unresponsive episodes that are unpredictable    Mobility  Bed Mobility Overal bed mobility: Independent                  Transfers Overall transfer level: Needs assistance Equipment used: None Transfers: Sit to/from Stand Sit to Stand: Supervision Stand pivot transfers: Supervision       General transfer comment: Had supervision for  safety but performed without issue  Ambulation/Gait Ambulation/Gait assistance: Supervision Gait Distance (Feet): 250 Feet Assistive device: None Gait Pattern/deviations: Step-through pattern     General Gait Details: Near normal gait pattern with very slight decrease in stance time on R; steady and without LOB   Stairs Stairs: Yes Stairs assistance: Min guard Stair Management: One rail Left;Step to pattern Number of Stairs: 12 General stair comments: educated on sequencing   Wheelchair Mobility    Modified Rankin (Stroke Patients Only) Modified Rankin (Stroke Patients Only) Pre-Morbid Rankin Score: No symptoms Modified Rankin: Slight disability     Balance Overall balance assessment: Needs assistance;History of Falls   Sitting balance-Leahy Scale: Good     Standing balance support: No upper extremity supported Standing balance-Leahy Scale: Good                              Cognition Arousal/Alertness: Awake/alert Behavior During Therapy: WFL for tasks assessed/performed Overall Cognitive Status: Within Functional Limits for tasks assessed                                 General Comments: following commands; eager to get home      Exercises      General Comments General comments (skin integrity, edema, etc.): VSS and with no episodes of unresponsiveness during therapy   Vestibular testing: EOEM/smooth pursuit: Intact, no nystagmus, no symptoms Gaze  stabilization: Intact, no nystagmus, + dizziness with Up/Down but not L/R Head turns: no nystagmus or dizziness [facility]: mild symptoms on R, moderate symptoms on L, no nystagmus with either Performed Epleys Maneuver for L posterior canal - pt tolerated.   Gave following HEP and educated on BPPV.  Discussed she did not have nystagmus but did have +symptoms.  Discussed dizziness likely multifactoral, but if BPPV is component the HEPcould help:  Access Code: [email or web address] URL:  [email or web address] Date: 01/23/2021 Prepared by: Maggie Font  Exercises Self-Epley Maneuver Left Ear - 1 x daily - 7 x weekly - 1 sets - 1 reps Seated Gaze Stabilization with Head Nod - 3 x daily - 7 x weekly - 1 sets - 10 reps       Pertinent Vitals/Pain Pain Assessment: No/denies pain    Home Living                      Prior Function            PT Goals (current goals can now be found in the care plan section) Acute Rehab PT Goals Patient Stated Goal: to go home PT Goal Formulation: With patient/family Time For Goal Achievement: 02/04/21 Potential to Achieve Goals: Good Progress towards PT goals: Progressing toward goals    Frequency    Min 3X/week      PT Plan Discharge plan needs to be updated    Co-evaluation              AM-PAC PT "6 Clicks" Mobility   Outcome Measure  Help needed turning from your back to your side while in a flat bed without using bedrails?: None Help needed moving from lying on your back to sitting on the side of a flat bed without using bedrails?: None Help needed moving to and from a bed to a chair (including a wheelchair)?: A Little Help needed standing up from a chair using your arms (e.g., wheelchair or bedside chair)?: A Little Help needed to walk in hospital room?: A Little Help needed climbing 3-5 steps with a railing? : A Little 6 Click Score: 20    End of Session Equipment Utilized During Treatment: Gait belt Activity Tolerance: Patient tolerated treatment well Patient left: in bed;with call bell/phone within reach Nurse Communication: Mobility status PT Visit Diagnosis: Unsteadiness on feet (R26.81);Difficulty in walking, not elsewhere classified (R26.2);Other symptoms and signs involving the nervous system (R29.898);Repeated falls (R29.6)     Time: 7253-6644 PT Time Calculation (min) (ACUTE ONLY): 30 min  Charges:  $Gait Training: 8-22 mins $Canalith Rep Proc: 8-22 mins                      Abran Richard, PT Acute Rehab Services Pager [phone] [facility] [place] 01/23/2021, 4:08 PM

## 2021-01-23 NOTE — Procedures (Addendum)
Patient Name: Desiree Russell  MRN: [id number]  Epilepsy Attending: Lora Havens  Referring Physician/Provider: Dr Roland Rack Duration: 01/22/2021 1026 to 01/23/2021 1139   Patient history: 49yo F with transient alteration of awareness. EEG to evaluate for seizure   Level of alertness: Awake, asleep   AEDs during EEG study: GBP   Technical aspects: This EEG study was done with scalp electrodes positioned according to the 10-20 International system of electrode placement. Electrical activity was acquired at a sampling rate of 500Hz  and reviewed with a high frequency filter of 70Hz  and a low frequency filter of 1Hz . EEG data were recorded continuously and digitally stored.   Description: The posterior dominant rhythm consists of 9-10 Hz activity of moderate voltage (25-35 uV) seen predominantly in posterior head regions, symmetric and reactive to eye opening and eye closing. Sleep was characterized by vertex waves, sleep spindles (12 to 14 Hz), maximal frontocentral region. Hyperventilation and photic stimulation were not performed.     Event button was pressed on 01/22/2021 at 1622. Per RN, she found patient down in the bathroom (not visible on camera).  Patient was able to answer questions.  Concomitant EEG before, during and after the event did not show any EEG changes suggest seizure.  IMPRESSION: This study is within normal limits. No seizures or epileptiform discharges were seen throughout the recording.  One event was recorded on 01/22/2021 as described above without concomitant EEG change. This was not an epileptic event.   [name]
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
## 2021-01-24 ENCOUNTER — Other Ambulatory Visit (HOSPITAL_COMMUNITY): Payer: Self-pay

## 2021-01-24 ENCOUNTER — Encounter: Payer: Self-pay | Admitting: *Deleted

## 2021-01-24 ENCOUNTER — Other Ambulatory Visit: Payer: Self-pay | Admitting: *Deleted

## 2021-01-24 LAB — CARDIOLIPIN ANTIBODIES, IGG, IGM, IGA
Anticardiolipin IgA: <9 APL U/mL (ref 0–11)
Anticardiolipin IgG: <9 GPL U/mL (ref 0–14)
Anticardiolipin IgM: 10 MPL U/mL (ref 0–12)

## 2021-01-24 NOTE — Patient Outreach (Signed)
[facility]) Care Management  01/24/2021  Shaquille Murdy 05-12-1971 [id number]   Transition of care call/case closure   Referral received:01/20/21 Initial outreach:01/24/21 Insurance: [employer]    Subjective: Initial successful telephone call to patient's preferred number in order to complete transition of care assessment; 2 HIPAA identifiers verified. Explained purpose of call and completed transition of care assessment.  Roneisha states she is still feeling tired and weak. She denies signs/symptoms stroke when reviewed, verbalized having handout on stroke symptoms and action plan. She reports having still having some weakness on right side unchanged since discharge. She reports having needed equipment in home, grab bars in shower shower chair/bench, wheelchair . She report having 14 stairs up the her bedroom, she reports staying up stairs during the day while home alone or going down stairs prior to her husband going to work , he frequently return home to check on her. She reports having a fall detection on her phone that alerts her husband and he is able to check on her. Patient reports having a fall today while outside in building with her daughter she denies injury or new concerns. She discussed receiving exercises to work on at home and she has been doing that( recommendations by physical for outpatient PT if desired by patient if not improving), she consider if she needs and discuss with PCP. She in  tolerating diet, denies bowel or bladder problems.  Spouse/children are assisting with her  recovery.   Reviewed accessing the following [place] Benefits : She denies any ongoing health issues and says she does not need a referral to one of the [place] chronic disease management programs.  She does not have the hospital indemnity plan  She  uses a [facility] outpatient [facility] at [facility], discussed mail order program .  Patient reports difficulty with affording  medication Ibuprofen -Famotidine originally ordered by ortho after she hurt her ankles but it was helping with managing fibromyalgia . Discussed following up with ortho for other recommendations, contacting insurance, discussing with PCP. She discussed getting patient assistance through [facility] and wonders if another patient assistance program is available .    Objective:  Mrs. Zenovia Justman  was hospitalized at [facility] 6/24-6/27/22 for  Acute stroke symptoms , right side weakness, facial droop, MRI probable punctate acute or early subacute infarct in the right parietal lobe Comorbidities include: Hypertension , chronic depression/anxiety, chronic headache, CVA, PTSD,  Auditory hallucinations  She was discharged to home on 02/22/21  without the need for home health services or DME.   Assessment:  Patient voices good understanding of all discharge instructions.  See transition of care flowsheet for assessment details.   Plan:  Reviewed hospital discharge diagnosis of CVA and discharge treatment plan using hospital discharge instructions, assessing medication adherence, reviewing problems requiring provider notification, and discussing the importance of follow up with surgeon, primary care provider and/or specialists as directed. Reinforced follow up with therapist/psychiatry appointments in place as well as scheduling neurology follow up.   Reviewed [place] healthy lifestyle program information to receive discounted premium for  2023   Step 1: Get  your annual physical  Step 2: Complete your health assessment  Step 3:Identify your current health status and complete the corresponding action step between July 30, 2020 and March 30, 2021.     Patient is agreeable to call in the next week, assess progress and any other care coordination needs, any resources for patient assistance on medication.  Will  route successful outreach letter with [place] [facility]  pamphlet and 24 Hour Nurse Line Magnet to [place] [facility] clinical pool to be mailed to patient's home address.  Thanked patient for their services to [facility].  Joylene Draft, RN, BSN  [place] Management Coordinator  [phone]- Mobile [phone]- Toll Free Main Office

## 2021-01-25 ENCOUNTER — Encounter: Payer: Self-pay | Admitting: Nurse Practitioner

## 2021-01-25 ENCOUNTER — Ambulatory Visit: Payer: 59 | Admitting: Nurse Practitioner

## 2021-01-25 ENCOUNTER — Other Ambulatory Visit (HOSPITAL_COMMUNITY): Payer: Self-pay

## 2021-01-25 ENCOUNTER — Other Ambulatory Visit: Payer: Self-pay

## 2021-01-25 VITALS — BP 123/77 | HR 80 | Temp 97.4°F

## 2021-01-25 DIAGNOSIS — I639 Cerebral infarction, unspecified: Secondary | ICD-10-CM | POA: Diagnosis not present

## 2021-01-25 DIAGNOSIS — M797 Fibromyalgia: Secondary | ICD-10-CM

## 2021-01-25 LAB — LUPUS ANTICOAGULANT PANEL
DRVVT: 35.7 s (ref 0.0–47.0)
PTT Lupus Anticoagulant: 39.3 s (ref 0.0–51.9)

## 2021-01-25 MED ORDER — GABAPENTIN 100 MG PO CAPS
300.0000 mg | ORAL_CAPSULE | Freq: Every day | ORAL | 3 refills | Status: DC
  Filled 2021-01-25: qty 270, 90d supply, fill #0

## 2021-01-25 NOTE — Patient Instructions (Signed)
Sciatica  Sciatica is pain, numbness, weakness, or tingling along the path of the sciatic nerve. The sciatic nerve starts in the lower back and runs down the back of each leg. The nerve controls the muscles in the lower leg and in the back of the knee. It also provides feeling (sensation) to the back of the thigh, the lower leg, and the sole of the foot. Sciatica is a symptom of another medical condition that pinches or puts pressure on thesciatic nerve. Sciatica most often only affects one side of the body. Sciatica usually goes away on its own or with treatment. In some cases, sciatica may come back (recur). What are the causes? This condition is caused by pressure on the sciatic nerve or pinching of the nerve. This may be the result of: A disk in between the bones of the spine bulging out too far (herniated disk). Age-related changes in the spinal disks. A pain disorder that affects a muscle in the buttock. Extra bone growth near the sciatic nerve. A break (fracture) of the pelvis. Pregnancy. Tumor. This is rare. What increases the risk? The following factors may make you more likely to develop this condition: Playing sports that place pressure or stress on the spine. Having poor strength and flexibility. A history of back injury or surgery. Sitting for long periods of time. Doing activities that involve repetitive bending or lifting. Obesity. What are the signs or symptoms? Symptoms can vary from mild to very severe, and they may include: Any of these problems in the lower back, leg, hip, or buttock: Mild tingling, numbness, or dull aches. Burning sensations. Sharp pains. Numbness in the back of the calf or the sole of the foot. Leg weakness. Severe back pain that makes movement difficult. Symptoms may get worse when you cough, sneeze, or laugh, or when you sit orstand for long periods of time. How is this diagnosed? This condition may be diagnosed based on: Your symptoms and  medical history. A physical exam. Blood tests. Imaging tests, such as: X-rays. MRI. CT scan. How is this treated? In many cases, this condition improves on its own without treatment. However, treatment may include: Reducing or modifying physical activity. Exercising and stretching. Icing and applying heat to the affected area. Medicines that help to: Relieve pain and swelling. Relax your muscles. Injections of medicines that help to relieve pain, irritation, and inflammation around the sciatic nerve (steroids). Surgery. Follow these instructions at home: Medicines Take over-the-counter and prescription medicines only as told by your health care provider. Ask your health care provider if the medicine prescribed to you: Requires you to avoid driving or using heavy machinery. Can cause constipation. You may need to take these actions to prevent or treat constipation: Drink enough fluid to keep your urine pale yellow. Take over-the-counter or prescription medicines. Eat foods that are high in fiber, such as beans, whole grains, and fresh fruits and vegetables. Limit foods that are high in fat and processed sugars, such as fried or sweet foods. Managing pain     If directed, put ice on the affected area. Put ice in a plastic bag. Place a towel between your skin and the bag. Leave the ice on for 20 minutes, 2-3 times a day. If directed, apply heat to the affected area. Use the heat source that your health care provider recommends, such as a moist heat pack or a heating pad. Place a towel between your skin and the heat source. Leave the heat on for 20-30 minutes.  Remove the heat if your skin turns bright red. This is especially important if you are unable to feel pain, heat, or cold. You may have a greater risk of getting burned. Activity  Return to your normal activities as told by your health care provider. Ask your health care provider what activities are safe for you. Avoid  activities that make your symptoms worse. Take brief periods of rest throughout the day. When you rest for longer periods, mix in some mild activity or stretching between periods of rest. This will help to prevent stiffness and pain. Avoid sitting for long periods of time without moving. Get up and move around at least one time each hour. Exercise and stretch regularly, as told by your health care provider. Do not lift anything that is heavier than 10 lb (4.5 kg) while you have symptoms of sciatica. When you do not have symptoms, you should still avoid heavy lifting, especially repetitive heavy lifting. When you lift objects, always use proper lifting technique, which includes: Bending your knees. Keeping the load close to your body. Avoiding twisting.  General instructions Maintain a healthy weight. Excess weight puts extra stress on your back. Wear supportive, comfortable shoes. Avoid wearing high heels. Avoid sleeping on a mattress that is too soft or too hard. A mattress that is firm enough to support your back when you sleep may help to reduce your pain. Keep all follow-up visits as told by your health care provider. This is important. Contact a health care provider if: You have pain that: Wakes you up when you are sleeping. Gets worse when you lie down. Is worse than you have experienced in the past. Lasts longer than 4 weeks. You have an unexplained weight loss. Get help right away if: You are not able to control when you urinate or have bowel movements (incontinence). You have: Weakness in your lower back, pelvis, buttocks, or legs that gets worse. Redness or swelling of your back. A burning sensation when you urinate. Summary Sciatica is pain, numbness, weakness, or tingling along the path of the sciatic nerve. This condition is caused by pressure on the sciatic nerve or pinching of the nerve. Sciatica can cause pain, numbness, or tingling in the lower back, legs, hips, and  buttocks. Treatment often includes rest, exercise, medicines, and applying ice or heat. This information is not intended to replace advice given to you by your health care provider. Make sure you discuss any questions you have with your healthcare provider. Document Revised: 08/04/2018 Document Reviewed: 08/04/2018 Elsevier Patient Education  2022 [place]. Sciatica Rehab Ask your health care provider which exercises are safe for you. Do exercises exactly as told by your health care provider and adjust them as directed. It is normal to feel mild stretching, pulling, tightness, or discomfort as you do these exercises. Stop right away if you feel sudden pain or your pain gets worse. Do not begin these exercises until told by your health care provider. Stretching and range-of-motion exercises These exercises warm up your muscles and joints and improve the movement and flexibility of your hips and back. These exercises also help to relieve pain,numbness, and tingling. Sciatic nerve glide Sit in a chair with your head facing down toward your chest. Place your hands behind your back. Let your shoulders slump forward. Slowly straighten one of your legs while you tilt your head back as if you are looking toward the ceiling. Only straighten your leg as far as you can without making your symptoms  worse. Hold this position for __________ seconds. Slowly return to the starting position. Repeat with your other leg. Repeat __________ times. Complete this exercise __________ times a day. Knee to chest with hip adduction and internal rotation  Lie on your back on a firm surface with both legs straight. Bend one of your knees and move it up toward your chest until you feel a gentle stretch in your lower back and buttock. Then, move your knee toward the shoulder that is on the opposite side from your leg. This is hip adduction and internal rotation. Hold your leg in this position by holding on to the front of  your knee. Hold this position for __________ seconds. Slowly return to the starting position. Repeat with your other leg. Repeat __________ times. Complete this exercise __________ times a day. Prone extension on elbows  Lie on your abdomen on a firm surface. A bed may be too soft for this exercise. Prop yourself up on your elbows. Use your arms to help lift your chest up until you feel a gentle stretch in your abdomen and your lower back. This will place some of your body weight on your elbows. If this is uncomfortable, try stacking pillows under your chest. Your hips should stay down, against the surface that you are lying on. Keep your hip and back muscles relaxed. Hold this position for __________ seconds. Slowly relax your upper body and return to the starting position. Repeat __________ times. Complete this exercise __________ times a day. Strengthening exercises These exercises build strength and endurance in your back. Endurance is theability to use your muscles for a long time, even after they get tired. Pelvic tilt This exercise strengthens the muscles that lie deep in the abdomen. Lie on your back on a firm surface. Bend your knees and keep your feet flat on the floor. Tense your abdominal muscles. Tip your pelvis up toward the ceiling and flatten your lower back into the floor. To help with this exercise, you may place a small towel under your lower back and try to push your back into the towel. Hold this position for __________ seconds. Let your muscles relax completely before you repeat this exercise. Repeat __________ times. Complete this exercise __________ times a day. Alternating arm and leg raises  Get on your hands and knees on a firm surface. If you are on a hard floor, you may want to use padding, such as an exercise mat, to cushion your knees. Line up your arms and legs. Your hands should be directly below your shoulders, and your knees should be directly below your  hips. Lift your left leg behind you. At the same time, raise your right arm and straighten it in front of you. Do not lift your leg higher than your hip. Do not lift your arm higher than your shoulder. Keep your abdominal and back muscles tight. Keep your hips facing the ground. Do not arch your back. Keep your balance carefully, and do not hold your breath. Hold this position for __________ seconds. Slowly return to the starting position. Repeat with your right leg and your left arm. Repeat __________ times. Complete this exercise __________ times a day. Posture and body mechanics Good posture and healthy body mechanics can help to relieve stress in your body's tissues and joints. Body mechanics refers to the movements and positions of your body while you do your daily activities. Posture is part of body mechanics. Good posture means: Your spine is in its natural S-curve  position (neutral). Your shoulders are pulled back slightly. Your head is not tipped forward. Follow these guidelines to improve your posture and body mechanics in youreveryday activities. Standing  When standing, keep your spine neutral and your feet about hip width apart. Keep a slight bend in your knees. Your ears, shoulders, and hips should line up. When you do a task in which you stand in one place for a long time, place one foot up on a stable object that is 2-4 inches (5-10 cm) high, such as a footstool. This helps keep your spine neutral.  Sitting  When sitting, keep your spine neutral and keep your feet flat on the floor. Use a footrest, if necessary, and keep your thighs parallel to the floor. Avoid rounding your shoulders, and avoid tilting your head forward. When working at a desk or a computer, keep your desk at a height where your hands are slightly lower than your elbows. Slide your chair under your desk so you are close enough to maintain good posture. When working at a computer, place your monitor at a  height where you are looking straight ahead and you do not have to tilt your head forward or downward to look at the screen.  Resting When lying down and resting, avoid positions that are most painful for you. If you have pain with activities such as sitting, bending, stooping, or squatting, lie in a position in which your body does not bend very much. For example, avoid curling up on your side with your arms and knees near your chest (fetal position). If you have pain with activities such as standing for a long time or reaching with your arms, lie with your spine in a neutral position and bend your knees slightly. Try the following positions: Lying on your side with a pillow between your knees. Lying on your back with a pillow under your knees. Lifting  When lifting objects, keep your feet at least shoulder width apart and tighten your abdominal muscles. Bend your knees and hips and keep your spine neutral. It is important to lift using the strength of your legs, not your back. Do not lock your knees straight out. Always ask for help to lift heavy or awkward objects.  This information is not intended to replace advice given to you by your health care provider. Make sure you discuss any questions you have with your healthcare provider. Document Revised: 11/07/2018 Document Reviewed: 08/07/2018 Elsevier Patient Education  [place].

## 2021-01-25 NOTE — Progress Notes (Signed)
[place], [place]  [address] Phone:  [phone]   Fax:  [phone]   Established Patient Office Visit  Subjective:  Patient ID: Desiree Russell, female    DOB: [birth year]  Age: 50 y.o. MRN: [id number]  CC:  Chief Complaint  Patient presents with   Hospitalization Follow-up    01/20/2021 - 01/23/2021; Questioning when she is able to go back to work and drive.     HPI Desiree Russell presents for hospital follow-up. She  has a past medical history of Anxiety, Dyslipidemia, Fibromyalgia, Headache, Pericarditis, Seizures ([place]), Tachycardia, and TIA (transient ischemic attack).    She reports she has an acute CVA.  She was treated with conservatively started on aspirin statin and Plavix.  She reports that she has right-sided weakness in her lower extremities.  This has been ongoing.  She was to be seen by a neurologist in the past however the visit was not completed.  She is needing a new referral to neurology. Her husband is concerned that some of her medications may be over sedating. She reports that she is ready to return to work however has been advised not to drive for the next 6 months due to CVA.  Is also having been diagnosed with elevated cholesterol approximately 3 months ago total cholesterol 243 LDL 71 HDL 58.  She had declined any medication was going to treat this with diet and exercise.  She was walking 5 miles per day and has changed her diet significantly.  She lost 10 pounds and was feeling good about her overall health Past Medical History:  Diagnosis Date   Anxiety    Dyslipidemia    Fibromyalgia    Headache    Pericarditis    Seizures (HCC)    Tachycardia    TIA (transient ischemic attack)    Recurrent    Past Surgical History:  Procedure Laterality Date   ABDOMINAL HYSTERECTOMY     CHOLECYSTECTOMY, LAPAROSCOPIC      Family History  Problem Relation Age of Onset   Hypertension Mother    Hypertension Father     Heart attack Father 90   Healthy Sister    Diabetes type II Brother    Hypertension Maternal Grandmother    Diabetes Paternal Grandmother    Healthy Sister    Healthy Sister    COPD Brother    Healthy Brother    Healthy Brother     Social History   Socioeconomic History   Marital status: Married    Spouse name: Not on file   Number of children: Not on file   Years of education: Not on file   Highest education level: Not on file  Occupational History   Not on file  Tobacco Use   Smoking status: Never   Smokeless tobacco: Never  Substance and Sexual Activity   Alcohol use: Yes    Comment: occ    Drug use: No   Sexual activity: Not on file  Other Topics Concern   Not on file  Social History Narrative   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     Social Determinants of Health   Financial Resource Strain: Not on file  Food Insecurity: Not on file  Transportation Needs: Not on file  Physical Activity: Not on file  Stress: Not on file  Social Connections: Not on file  Intimate Partner Violence: Not on file    Outpatient Medications Prior  to Visit  Medication Sig Dispense Refill   albuterol (VENTOLIN HFA) 108 (90 Base) MCG/ACT inhaler INHALE 2 PUFFS INTO THE LUNGS EVERY 4 HOURS AS NEEDED FOR WHEEZING. 8.5 g 0   ALPRAZolam (XANAX) 0.5 MG tablet Take 1 tablet (0.5 mg total) by mouth 2 (two) times daily as needed for anxiety. 45 tablet 1   aspirin 81 MG EC tablet Take 1 tablet (81 mg total) by mouth daily. 21 tablet 0   atorvastatin (LIPITOR) 40 MG tablet Take 1 tablet (40 mg total) by mouth daily. 30 tablet 1   B Complex Vitamins (VITAMIN B COMPLEX PO) Take 1 capsule by mouth daily.     cetirizine (ZYRTEC) 10 MG tablet Take 10 mg by mouth daily.     Cholecalciferol (VITAMIN D3) 1000 units CAPS Take 1 tablet by mouth daily.     clopidogrel (PLAVIX) 75 MG tablet Take 1 tablet (75 mg total) by mouth daily. 30 tablet 1   Cranberry-Vitamin C-Probiotic (AZO  CRANBERRY PO) Take 2 capsules by mouth as needed (UTI).     diclofenac sodium (VOLTAREN) 1 % GEL Apply 2 g topically 2 (two) times daily as needed (apply to chest).     DULoxetine (CYMBALTA) 30 MG capsule Take 1 capsule (30 mg total) by mouth 2 (two) times daily. 60 capsule 0   esomeprazole (NEXIUM) 20 MG packet Take 20 mg by mouth daily before breakfast.     Galcanezumab-gnlm (EMGALITY) 120 MG/ML SOAJ Inject 120 mg into the skin every 30 (thirty) days.     magnesium citrate SOLN Take 1 Bottle by mouth once.     metoprolol succinate (TOPROL-XL) 50 MG 24 hr tablet Take 1 tablet (50 mg total) by mouth daily. 90 tablet 3   OLANZapine-FLUoxetine (SYMBYAX) 6-25 MG capsule Take 1 capsule by mouth at bedtime. 30 capsule 1   sennosides-docusate sodium (SENOKOT-S) 8.6-50 MG tablet Take 4 tablets by mouth at bedtime.     Ubrogepant (UBRELVY) 100 MG TABS Take 100 mg by mouth as needed (migraine).     valACYclovir (VALTREX) 500 MG tablet Take 1 tablet (500 mg total) by mouth daily. 90 tablet 1   gabapentin (NEURONTIN) 100 MG capsule Take 1 capsule by mouth at bedtime. (Patient taking differently: Take 200 mg by mouth at bedtime.) 90 capsule 3   Ibuprofen-Famotidine (DUEXIS) 800-26.6 MG TABS Take 1 tablet by mouth in the morning, at noon, and at bedtime. (Patient not taking: No sig reported) 90 tablet 3   No facility-administered medications prior to visit.    Allergies  Allergen Reactions   Codeine Hives and Rash   Morphine Hives and Other (See Comments)    Headache   Morphine And Related Hives   Oxycodone Hives   Chlorhexidine Hives and Rash         ROS Review of Systems    Objective:    Physical Exam Constitutional:      Appearance: She is obese.  HENT:     Head: Normocephalic and atraumatic.  Cardiovascular:     Rate and Rhythm: Normal rate and regular rhythm.     Pulses: Normal pulses.     Heart sounds: Normal heart sounds.  Pulmonary:     Effort: Pulmonary effort is normal.      Breath sounds: Normal breath sounds.  Musculoskeletal:     Cervical back: Normal range of motion.     Comments: 4/5 right lower extremity  Skin:    General: Skin is warm and dry.  Capillary Refill: Capillary refill takes less than 2 seconds.  Neurological:     General: No focal deficit present.     Mental Status: She is alert and oriented to person, place, and time.  Psychiatric:        Behavior: Behavior normal.    BP 123/77   Pulse 80   Temp (!) 97.4 F (36.3 C)   SpO2 100%  Wt Readings from Last 3 Encounters:  01/23/21 207 lb 10.8 oz (94.2 kg)  11/29/20 208 lb (94.3 kg)  10/27/20 217 lb (98.4 kg)     Health Maintenance Due  Topic Date Due   PAP SMEAR-Modifier  Never done    There are no preventive care reminders to display for this patient.  Lab Results  Component Value Date   TSH 0.520 01/21/2021   Lab Results  Component Value Date   WBC 6.1 01/21/2021   HGB 10.9 (L) 01/21/2021   HCT 32.6 (L) 01/21/2021   MCV 91.1 01/21/2021   PLT 221 01/21/2021   Lab Results  Component Value Date   NA 134 (L) 01/21/2021   K 3.7 01/21/2021   CO2 23 01/21/2021   GLUCOSE 91 01/21/2021   BUN 15 01/21/2021   CREATININE 0.94 01/21/2021   BILITOT 0.4 01/20/2021   ALKPHOS 39 01/20/2021   AST 19 01/20/2021   ALT 15 01/20/2021   PROT 6.5 01/20/2021   ALBUMIN 3.5 01/20/2021   CALCIUM 8.3 (L) 01/21/2021   ANIONGAP 5 01/21/2021   Lab Results  Component Value Date   CHOL 175 01/21/2021   Lab Results  Component Value Date   HDL 40 (L) 01/21/2021   Lab Results  Component Value Date   LDLCALC 110 (H) 01/21/2021   Lab Results  Component Value Date   TRIG 127 01/21/2021   Lab Results  Component Value Date   CHOLHDL 4.4 01/21/2021   Lab Results  Component Value Date   HGBA1C 4.8 01/21/2021      Assessment & Plan:   Problem List Items Addressed This Visit       Cardiovascular and Mediastinum   CVA (cerebral vascular accident) ([place]) - Primary Recent no  new residual effects Continue Plavix and statin as directed Continue to monitor diet and continue exercise Work note provided encourage patient to follow recommendations of not driving for 6 months due to CVA   Relevant Orders   Ambulatory referral to Neurology  Fibromyalgia persistent however unable to use [facility] due to new diagnosis and current use of Plavix  Meds ordered this encounter  Medications   gabapentin (NEURONTIN) 100 MG capsule    Sig: Take 3 capsules (300 mg total) by mouth at bedtime.    Dispense:  270 capsule    Refill:  3    Order Specific Question:   Supervising Provider    Answer:   Tresa Garter [[id number]]    Follow-up: Return in about 2 months (around 03/27/2021).    Vevelyn Francois, NP

## 2021-01-27 ENCOUNTER — Ambulatory Visit (INDEPENDENT_AMBULATORY_CARE_PROVIDER_SITE_OTHER): Payer: 59 | Admitting: Clinical

## 2021-01-27 ENCOUNTER — Other Ambulatory Visit: Payer: Self-pay

## 2021-01-27 DIAGNOSIS — F331 Major depressive disorder, recurrent, moderate: Secondary | ICD-10-CM | POA: Diagnosis not present

## 2021-01-27 DIAGNOSIS — F419 Anxiety disorder, unspecified: Secondary | ICD-10-CM

## 2021-01-27 DIAGNOSIS — F4310 Post-traumatic stress disorder, unspecified: Secondary | ICD-10-CM

## 2021-01-27 NOTE — Progress Notes (Signed)
   THERAPIST PROGRESS NOTE  Session Time: 9am  Participation Level: Active  Behavioral Response: NAAlertAnxious and pleasant  Type of Therapy: Individual Therapy  Treatment Goals addressed: Anxiety  Interventions: Supportive Virtual Visit via Telephone Note  I connected with Desiree Russell on 01/27/21 at  9:00 AM EDT by telephone and verified that I am speaking with the correct person using two identifiers.  Location: Patient: home Provider: office   I discussed the limitations, risks, security and privacy concerns of performing an evaluation and management service by telephone and the availability of in person appointments. I also discussed with the patient that there may be a patient responsible charge related to this service. The patient expressed understanding and agreed to proceed.   I discussed the assessment and treatment plan with the patient. The patient was provided an opportunity to ask questions and all were answered. The patient agreed with the plan and demonstrated an understanding of the instructions.   The patient was advised to call back or seek an in-person evaluation if the symptoms worsen or if the condition fails to improve as anticipated.  I provided 22 minutes of non-face-to-face time during this encounter.   Summary: Desiree Russell is a 50 y.o. female who reports having a stroke last week. Pt reports she was hospitalized after experiencing stroke like sxs while at work. Pt reports she is unsure when she will be cleared to return to work and has been referred to a neurologist. Pt says she has been worrying about her daughter who lives in the home, getting married this month and leaving the home. Pt reports having a strong connection with her daughter and worries this may change.   Suicidal/Homicidal: Pt denies SI/HI no plan or intent to harm self or others reported.  Therapist Response: CSW assessed for changes in mood and behavior. CSW validated pt feelings and  offered support as she discussed her worries. CSW reminded pt of coping methods previously discussed to assist with managing life stressors.   Plan: Return again in 2 weeks.  Diagnosis: Axis I: PTSD                                     Moderate episode major depressive disorder                                     Anxiety                                        Axis II: No diagnosis    Desiree Rack, LCSW 01/27/2021

## 2021-01-31 ENCOUNTER — Other Ambulatory Visit: Payer: Self-pay | Admitting: *Deleted

## 2021-01-31 LAB — ANTINUCLEAR ANTIBODIES, IFA: ANA Ab, IFA: NEGATIVE

## 2021-01-31 NOTE — Patient Outreach (Signed)
[facility]) Care Management  01/31/2021  [name] 07/31/1970 [id number]   Transition of care follow up  call/case closure    Referral received:01/20/21 Initial outreach:01/24/21 Insurance: [employer]   Subjective: Successfu follow up call to patient, reports doing better, reports improvement in right side weakness. She denies having recent fall, fall precaustions measures reviewed ,she denies new signs and symptoms ot stroke. She discussed awaiting call for neurology referrral and if no called received by tomorrow she will reach out to office, she has contact number. She reports having a good visit with counselor on last week.  She reports provider has discontinued Duexis while on Plavix  so no need for additional follow up on patient assitance programs at this time as she previously discussed interest.    Objective:  Desiree Russell  was hospitalized at [facility] 6/24-6/27/22 for  Acute stroke symptoms , right side weakness, facial droop, MRI probable punctate acute or early subacute infarct in the right parietal lobe Comorbidities include: Hypertension , chronic depression/anxiety, chronic headache, CVA, PTSD,  Auditory hallucinations She was discharged to home on 02/22/21  without the need for home health services or DME.   Plan  No new care management needs identified. Will close case to [facility] care management .  Joylene Draft, RN, BSN  [facility] Management Coordinator  [phone]- Mobile [phone]- Toll Free Main Office

## 2021-02-01 NOTE — Telephone Encounter (Signed)
Called patient to let her know appointment was scheduled with [facility] neurology for 7/25 12:50pm. Patient stated she is going to [facility] tomorrow. Okay to cancel appointment later this month at [facility].

## 2021-02-02 DIAGNOSIS — R4701 Aphasia: Secondary | ICD-10-CM | POA: Diagnosis not present

## 2021-02-02 DIAGNOSIS — R262 Difficulty in walking, not elsewhere classified: Secondary | ICD-10-CM | POA: Diagnosis not present

## 2021-02-02 DIAGNOSIS — R531 Weakness: Secondary | ICD-10-CM | POA: Diagnosis not present

## 2021-02-02 DIAGNOSIS — Z8673 Personal history of transient ischemic attack (TIA), and cerebral infarction without residual deficits: Secondary | ICD-10-CM | POA: Diagnosis not present

## 2021-02-03 ENCOUNTER — Other Ambulatory Visit (HOSPITAL_COMMUNITY): Payer: Self-pay

## 2021-02-08 ENCOUNTER — Other Ambulatory Visit (HOSPITAL_COMMUNITY): Payer: Self-pay

## 2021-02-09 ENCOUNTER — Other Ambulatory Visit (HOSPITAL_COMMUNITY): Payer: Self-pay

## 2021-02-13 ENCOUNTER — Telehealth: Payer: Self-pay | Admitting: Nurse Practitioner

## 2021-02-13 ENCOUNTER — Other Ambulatory Visit (HOSPITAL_COMMUNITY): Payer: Self-pay

## 2021-02-13 DIAGNOSIS — G43909 Migraine, unspecified, not intractable, without status migrainosus: Secondary | ICD-10-CM | POA: Diagnosis not present

## 2021-02-13 DIAGNOSIS — R519 Headache, unspecified: Secondary | ICD-10-CM | POA: Diagnosis not present

## 2021-02-13 DIAGNOSIS — G44209 Tension-type headache, unspecified, not intractable: Secondary | ICD-10-CM | POA: Diagnosis not present

## 2021-02-13 NOTE — Telephone Encounter (Signed)
  Pharmacy calling requesting a Prescription for Rx #: [id number]  Galcanezumab-gnlm [facility]) 120 MG/ML SOSY [[id number]]   for this to be sent in asap.   [address], [place] [address]  Phone:  [phone]  Fax:  [phone]  DEA #:  [id number]  Pharmacy Comments: pa req sent to [facility] 02/03/21

## 2021-02-14 ENCOUNTER — Other Ambulatory Visit: Payer: Self-pay

## 2021-02-14 ENCOUNTER — Encounter (HOSPITAL_COMMUNITY): Payer: Self-pay | Admitting: Psychiatry

## 2021-02-14 ENCOUNTER — Other Ambulatory Visit (HOSPITAL_COMMUNITY): Payer: Self-pay

## 2021-02-14 ENCOUNTER — Telehealth (INDEPENDENT_AMBULATORY_CARE_PROVIDER_SITE_OTHER): Payer: 59 | Admitting: Psychiatry

## 2021-02-14 DIAGNOSIS — F419 Anxiety disorder, unspecified: Secondary | ICD-10-CM | POA: Diagnosis not present

## 2021-02-14 DIAGNOSIS — R441 Visual hallucinations: Secondary | ICD-10-CM | POA: Diagnosis not present

## 2021-02-14 DIAGNOSIS — F4310 Post-traumatic stress disorder, unspecified: Secondary | ICD-10-CM | POA: Diagnosis not present

## 2021-02-14 DIAGNOSIS — F331 Major depressive disorder, recurrent, moderate: Secondary | ICD-10-CM

## 2021-02-14 MED ORDER — DULOXETINE HCL 60 MG PO CPEP
60.0000 mg | ORAL_CAPSULE | Freq: Every day | ORAL | 1 refills | Status: DC
Start: 2021-02-14 — End: 2021-04-18
  Filled 2021-02-14: qty 30, 30d supply, fill #0
  Filled 2021-04-09: qty 30, 30d supply, fill #1

## 2021-02-14 MED ORDER — ALPRAZOLAM 0.5 MG PO TABS
0.5000 mg | ORAL_TABLET | Freq: Two times a day (BID) | ORAL | 1 refills | Status: DC | PRN
  Filled 2021-02-14: qty 45, 23d supply, fill #0
  Filled 2021-04-09: qty 45, 23d supply, fill #1

## 2021-02-14 MED ORDER — OLANZAPINE-FLUOXETINE HCL 6-25 MG PO CAPS
1.0000 | ORAL_CAPSULE | Freq: Every day | ORAL | 1 refills | Status: DC
  Filled 2021-02-14: qty 30, 30d supply, fill #0
  Filled 2021-04-09: qty 30, 30d supply, fill #1

## 2021-02-14 NOTE — Progress Notes (Signed)
Virtual Visit via Telephone Note  I connected with Desiree Russell on 02/14/21 at  4:00 PM EDT by telephone and verified that I am speaking with the correct person using two identifiers.  Location: Patient: Home Provider: Home Office   I discussed the limitations, risks, security and privacy concerns of performing an evaluation and management service by telephone and the availability of in person appointments. I also discussed with the patient that there may be a patient responsible charge related to this service. The patient expressed understanding and agreed to proceed.   History of Present Illness: Patient was evaluated by phone session.  She was admitted on the medical floor because of right-sided weakness and having strokelike symptoms.  Patient has a history of multiple strokes in the past.  She has residual weakness on the right side but now gradually getting better.  She can drive and does not feel any concerns.  She is back to work.  She was seen by psychiatrist on consultation liaison service.  No medicines were changed.  She is actually doing better and she is feeling less depressed and less anxious.  She lost some weight and she is happy about it.  Her daughter got married last Saturday and she is happy about it.  She is in therapy with [name].  She is taking Xanax 0.5 mg daily and occasionally she takes second dose.  She denies any suicidal thoughts or homicidal thoughts.  She is taking Symbyax and the dose was increased on the last visit.  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  Patient has chronic hallucinations but they are not as bad.  She sometimes see a shadow about resembles a man about it is chronic and she is learning how to handle these hallucinations.  Patient wants to keep the current medication since it is working well.  She denies any anhedonia or any hopelessness.  She denies any panic attack.   Recent Results (from the past 2160 hour(s))  B  Nat Peptide     Status: None   Collection Time: 11/29/20  4:17 PM  Result Value Ref Range   BNP 7.1 0.0 - 100.0 pg/mL  Urinalysis Dipstick     Status: Normal   Collection Time: 01/11/21  8:39 AM  Result Value Ref Range   Color, UA     Clarity, UA     Glucose, UA Negative Negative   Bilirubin, UA neg    Ketones, UA neg    Spec Grav, UA 1.025 1.010 - 1.025   Blood, UA neg    pH, UA 6.0 5.0 - 8.0   Protein, UA Negative Negative   Urobilinogen, UA 0.2 0.2 or 1.0 E.U./dL   Nitrite, UA neg    Leukocytes, UA Negative Negative   Appearance     Odor    Vitamin B12     Status: None   Collection Time: 01/11/21  9:27 AM  Result Value Ref Range   Vitamin B-12 431 232 - 1,245 pg/mL  VITAMIN D 25 Hydroxy (Vit-D Deficiency, Fractures)     Status: None   Collection Time: 01/11/21  9:27 AM  Result Value Ref Range   Vit D, 25-Hydroxy 36.7 30.0 - 100.0 ng/mL    Comment: Vitamin D deficiency has been defined by the Institute of Medicine and an Endocrine Society practice guideline as a level of serum 25-OH vitamin D less than 20 ng/mL (1,2). The Endocrine Society went on to further define vitamin D insufficiency  as a level between 21 and 29 ng/mL (2). 1. IOM (Institute of Medicine). 2010. Dietary reference    intakes for calcium and D. [place]: The    [employer]. 2. Holick MF, Binkley [place], Bischoff-Ferrari HA, et al.    Evaluation, treatment, and prevention of vitamin D    deficiency: an Endocrine Society clinical practice    guideline. JCEM. 2011 Jul; 96(7):1911-30.   TSH     Status: None   Collection Time: 01/11/21  9:27 AM  Result Value Ref Range   TSH 1.010 0.450 - 4.500 uIU/mL  Hepatitis C antibody     Status: None   Collection Time: 01/11/21  9:27 AM  Result Value Ref Range   Hep C Virus Ab <0.1 0.0 - 0.9 s/co ratio    Comment:                                   Negative:     < 0.8                              Indeterminate: 0.8 - 0.9                                    Positive:     > 0.9  HCV antibody alone does not differentiate between  previous resolved infection and active infection.  The CDC and current clinical guidelines recommend  that a positive HCV antibody result be followed up  with an HCV RNA test to support the diagnosis of  acute HCV infection. [facility] offers Hepatitis C  Virus (HCV) RNA, Diagnosis, NAA (937902) and  Hepatitis C Virus (HCV) Antibody with reflex to  Quantitative Real-time PCR (144050).   HIV Antibody (routine testing w rflx)     Status: None   Collection Time: 01/11/21  9:27 AM  Result Value Ref Range   HIV Screen 4th Generation wRfx Non Reactive Non Reactive    Comment: HIV Negative HIV-1/HIV-2 antibodies and HIV-1 p24 antigen were NOT detected. There is no laboratory evidence of HIV infection.   Resp Panel by RT-PCR (Flu A&B, Covid) Nasopharyngeal Swab     Status: None   Collection Time: 01/20/21  9:32 AM   Specimen: Nasopharyngeal Swab; Nasopharyngeal(NP) swabs in vial transport medium  Result Value Ref Range   SARS Coronavirus 2 by RT PCR NEGATIVE NEGATIVE    Comment: (NOTE) SARS-CoV-2 target nucleic acids are NOT DETECTED.  The SARS-CoV-2 RNA is generally detectable in upper respiratory specimens during the acute phase of infection. The lowest concentration of SARS-CoV-2 viral copies this assay can detect is 138 copies/mL. A negative result does not preclude SARS-Cov-2 infection and should not be used as the sole basis for treatment or other patient management decisions. A negative result may occur with  improper specimen collection/handling, submission of specimen other than nasopharyngeal swab, presence of viral mutation(s) within the areas targeted by this assay, and inadequate number of viral copies(<138 copies/mL). A negative result must be combined with clinical observations, patient history, and epidemiological information. The expected result is Negative.  Fact Sheet for Patients:   EntrepreneurPulse.com.au  Fact Sheet for Healthcare Providers:  IncredibleEmployment.be  This test is no t yet approved or cleared by the [place] FDA and  has been authorized for detection  and/or diagnosis of SARS-CoV-2 by FDA under an Emergency Use Authorization (EUA). This EUA will remain  in effect (meaning this test can be used) for the duration of the COVID-19 declaration under Section 564(b)(1) of the Act, 21 U.S.C.section 360bbb-3(b)(1), unless the authorization is terminated  or revoked sooner.       Influenza A by PCR NEGATIVE NEGATIVE   Influenza B by PCR NEGATIVE NEGATIVE    Comment: (NOTE) The Xpert Xpress SARS-CoV-2/FLU/RSV plus assay is intended as an aid in the diagnosis of influenza from Nasopharyngeal swab specimens and should not be used as a sole basis for treatment. Nasal washings and aspirates are unacceptable for Xpert Xpress SARS-CoV-2/FLU/RSV testing.  Fact Sheet for Patients: EntrepreneurPulse.com.au  Fact Sheet for Healthcare Providers: IncredibleEmployment.be  This test is not yet approved or cleared by the [place] FDA and has been authorized for detection and/or diagnosis of SARS-CoV-2 by FDA under an Emergency Use Authorization (EUA). This EUA will remain in effect (meaning this test can be used) for the duration of the COVID-19 declaration under Section 564(b)(1) of the Act, 21 U.S.C. section 360bbb-3(b)(1), unless the authorization is terminated or revoked.  Performed at [facility], [place] [address], [place], [place] [address]   Protime-INR     Status: None   Collection Time: 01/20/21  9:39 AM  Result Value Ref Range   Prothrombin Time 12.8 11.4 - 15.2 seconds   INR 1.0 0.8 - 1.2    Comment: (NOTE) INR goal varies based on device and disease states. Performed at [facility], [place] [address], [place], [place] [address]   APTT     Status: None    Collection Time: 01/20/21  9:39 AM  Result Value Ref Range   aPTT 28 24 - 36 seconds    Comment: Performed at [facility] [address], [place], [place] [address]  CBC     Status: Abnormal   Collection Time: 01/20/21  9:39 AM  Result Value Ref Range   WBC 5.0 4.0 - 10.5 K/uL   RBC 3.99 3.87 - 5.11 MIL/uL   Hemoglobin 11.9 (L) 12.0 - 15.0 g/dL   HCT 36.8 36.0 - 46.0 %   MCV 92.2 80.0 - 100.0 fL   MCH 29.8 26.0 - 34.0 pg   MCHC 32.3 30.0 - 36.0 g/dL   RDW 13.6 11.5 - 15.5 %   Platelets 214 150 - 400 K/uL   nRBC 0.0 0.0 - 0.2 %    Comment: Performed at [facility], [place] [address] [place], [place] [address]  Differential     Status: None   Collection Time: 01/20/21  9:39 AM  Result Value Ref Range   Neutrophils Relative % 48 %   Neutro Abs 2.4 1.7 - 7.7 K/uL   Lymphocytes Relative 42 %   Lymphs Abs 2.1 0.7 - 4.0 K/uL   Monocytes Relative 8 %   Monocytes Absolute 0.4 0.1 - 1.0 K/uL   Eosinophils Relative 2 %   Eosinophils Absolute 0.1 0.0 - 0.5 K/uL   Basophils Relative 0 %   Basophils Absolute 0.0 0.0 - 0.1 K/uL   Immature Granulocytes 0 %   Abs Immature Granulocytes 0.01 0.00 - 0.07 K/uL    Comment: Performed at [facility] [address] [place], [place] [address]  Comprehensive metabolic panel     Status: Abnormal   Collection Time: 01/20/21  9:39 AM  Result Value Ref Range   Sodium  136 135 - 145 mmol/L   Potassium 4.4 3.5 - 5.1 mmol/L   Chloride 104 98 - 111 mmol/L   CO2 27 22 - 32 mmol/L   Glucose, Bld 91 70 - 99 mg/dL    Comment: Glucose reference range applies only to samples taken after fasting for at least 8 hours.   BUN 13 6 - 20 mg/dL   Creatinine, Ser 1.07 (H) 0.44 - 1.00 mg/dL   Calcium 8.9 8.9 - 10.3 mg/dL   Total Protein 6.5 6.5 - 8.1 g/dL   Albumin 3.5 3.5 - 5.0 g/dL   AST 19 15 - 41 U/L   ALT 15 0 - 44 U/L   Alkaline Phosphatase 39 38 - 126 U/L   Total Bilirubin 0.4 0.3 - 1.2 mg/dL   GFR, Estimated >60 >60 mL/min     Comment: (NOTE) Calculated using the CKD-EPI Creatinine Equation (2021)    Anion gap 5 5 - 15    Comment: Performed at [facility] [address], [place], [place] [address]  CBG monitoring, ED     Status: None   Collection Time: 01/20/21  9:39 AM  Result Value Ref Range   Glucose-Capillary 85 70 - 99 mg/dL    Comment: Glucose reference range applies only to samples taken after fasting for at least 8 hours.  Troponin I (High Sensitivity)     Status: None   Collection Time: 01/20/21  9:39 AM  Result Value Ref Range   Troponin I (High Sensitivity) 5 <18 ng/L    Comment: (NOTE) Elevated high sensitivity troponin I (hsTnI) values and significant  changes across serial measurements may suggest ACS but many other  chronic and acute conditions are known to elevate hsTnI results.  Refer to the "Links" section for chest pain algorithms and additional  guidance. Performed at [facility], [place] [address], [place], [place] [address]   I-Stat beta hCG blood, ED     Status: None   Collection Time: 01/20/21  9:44 AM  Result Value Ref Range   I-stat hCG, quantitative <5.0 <5 mIU/mL   Comment 3            Comment:   GEST. AGE      CONC.  (mIU/mL)   <=1 WEEK        5 - 50     2 WEEKS       50 - 500     3 WEEKS       100 - 10,000     4 WEEKS     1,000 - 30,000        FEMALE AND NON-PREGNANT FEMALE:     LESS THAN 5 mIU/mL   I-stat chem 8, ED     Status: None   Collection Time: 01/20/21  9:47 AM  Result Value Ref Range   Sodium 139 135 - 145 mmol/L   Potassium 4.4 3.5 - 5.1 mmol/L   Chloride 105 98 - 111 mmol/L   BUN 14 6 - 20 mg/dL   Creatinine, Ser 1.00 0.44 - 1.00 mg/dL   Glucose, Bld 85 70 - 99 mg/dL    Comment: Glucose reference range applies only to samples taken after fasting for at least 8 hours.   Calcium, Ion 1.23 1.15 - 1.40 mmol/L   TCO2 24 22 - 32 mmol/L   Hemoglobin 13.3 12.0 - 15.0 g/dL   HCT 39.0 36.0 - 46.0 %  Troponin I (High Sensitivity)  Status: None    Collection Time: 01/20/21 12:45 PM  Result Value Ref Range   Troponin I (High Sensitivity) 5 <18 ng/L    Comment: (NOTE) Elevated high sensitivity troponin I (hsTnI) values and significant  changes across serial measurements may suggest ACS but many other  chronic and acute conditions are known to elevate hsTnI results.  Refer to the "Links" section for chest pain algorithms and additional  guidance. Performed at [facility], [place] [address], [place], [place] [address]   CBG monitoring, ED     Status: None   Collection Time: 01/20/21  9:56 PM  Result Value Ref Range   Glucose-Capillary 92 70 - 99 mg/dL    Comment: Glucose reference range applies only to samples taken after fasting for at least 8 hours.  Hemoglobin A1c     Status: None   Collection Time: 01/21/21  1:56 AM  Result Value Ref Range   Hgb A1c MFr Bld 4.8 4.8 - 5.6 %    Comment: (NOTE) Pre diabetes:          5.7%-6.4%  Diabetes:              >6.4%  Glycemic control for   <7.0% adults with diabetes    Mean Plasma Glucose 91.06 mg/dL    Comment: Performed at [facility] [address] [place], [place] [address]  Lipid panel     Status: Abnormal   Collection Time: 01/21/21  1:56 AM  Result Value Ref Range   Cholesterol 175 0 - 200 mg/dL   Triglycerides 127 <150 mg/dL   HDL 40 (L) >40 mg/dL   Total CHOL/HDL Ratio 4.4 RATIO   VLDL 25 0 - 40 mg/dL   LDL Cholesterol 110 (H) 0 - 99 mg/dL    Comment:        Total Cholesterol/HDL:CHD Risk Coronary Heart Disease Risk Table                     Men   Women  1/2 Average Risk   3.4   3.3  Average Risk       5.0   4.4  2 X Average Risk   9.6   7.1  3 X Average Risk  23.4   11.0        Use the calculated Patient Ratio above and the CHD Risk Table to determine the patient's CHD Risk.        ATP III CLASSIFICATION (LDL):  <100     mg/dL   Optimal  100-129  mg/dL   Near or Above                    Optimal  130-159  mg/dL   Borderline  160-189  mg/dL    High  >190     mg/dL   Very High Performed at [facility] [address], [place], [place] [address]   CBC     Status: Abnormal   Collection Time: 01/21/21  1:56 AM  Result Value Ref Range   WBC 6.1 4.0 - 10.5 K/uL   RBC 3.58 (L) 3.87 - 5.11 MIL/uL   Hemoglobin 10.9 (L) 12.0 - 15.0 g/dL   HCT 32.6 (L) 36.0 - 46.0 %   MCV 91.1 80.0 - 100.0 fL   MCH 30.4 26.0 - 34.0 pg   MCHC 33.4 30.0 - 36.0 g/dL   RDW 13.6 11.5 - 15.5 %   Platelets 221  150 - 400 K/uL   nRBC 0.0 0.0 - 0.2 %    Comment: Performed at [facility], [place] [address] [place], [place] [address]  Basic metabolic panel     Status: Abnormal   Collection Time: 01/21/21  1:56 AM  Result Value Ref Range   Sodium 134 (L) 135 - 145 mmol/L   Potassium 3.7 3.5 - 5.1 mmol/L   Chloride 106 98 - 111 mmol/L   CO2 23 22 - 32 mmol/L   Glucose, Bld 91 70 - 99 mg/dL    Comment: Glucose reference range applies only to samples taken after fasting for at least 8 hours.   BUN 15 6 - 20 mg/dL   Creatinine, Ser 0.94 0.44 - 1.00 mg/dL   Calcium 8.3 (L) 8.9 - 10.3 mg/dL   GFR, Estimated >60 >60 mL/min    Comment: (NOTE) Calculated using the CKD-EPI Creatinine Equation (2021)    Anion gap 5 5 - 15    Comment: Performed at [facility] [address], [place], [place] [address]  Magnesium     Status: None   Collection Time: 01/21/21  1:56 AM  Result Value Ref Range   Magnesium 1.8 1.7 - 2.4 mg/dL    Comment: Performed at [facility] [address] [place], [place] [address]  Phosphorus     Status: None   Collection Time: 01/21/21  1:56 AM  Result Value Ref Range   Phosphorus 3.4 2.5 - 4.6 mg/dL    Comment: Performed at [facility] [address], [place], [place] [address]  ECHOCARDIOGRAM COMPLETE     Status: None   Collection Time: 01/21/21 12:19 PM  Result Value Ref Range   Weight 3,280.44 oz   Height 64 in   BP 133/90 mmHg   S' Lateral 3.00 cm   AR max vel 1.78 cm2   AV Area VTI 1.77 cm2    AV Mean grad 5.0 mmHg   AV Peak grad 10.0 mmHg   Ao pk vel 1.58 m/s   Area-P 1/2 3.31 cm2   Radius 0.50 cm   MV M vel 5.14 m/s   AV Area mean vel 1.77 cm2   MV VTI 1.83 cm2   MV Peak grad 105.7 mmHg  Rapid urine drug screen (hospital performed)     Status: None   Collection Time: 01/21/21  6:20 PM  Result Value Ref Range   Opiates NONE DETECTED NONE DETECTED   Cocaine NONE DETECTED NONE DETECTED   Benzodiazepines NONE DETECTED NONE DETECTED   Amphetamines NONE DETECTED NONE DETECTED   Tetrahydrocannabinol NONE DETECTED NONE DETECTED   Barbiturates NONE DETECTED NONE DETECTED    Comment: (NOTE) DRUG SCREEN FOR MEDICAL PURPOSES ONLY.  IF CONFIRMATION IS NEEDED FOR ANY PURPOSE, NOTIFY LAB WITHIN 5 DAYS.  LOWEST DETECTABLE LIMITS FOR URINE DRUG SCREEN Drug Class                     Cutoff (ng/mL) Amphetamine and metabolites    1000 Barbiturate and metabolites    200 Benzodiazepine                 193 Tricyclics and metabolites     300 Opiates and metabolites        300 Cocaine and metabolites        300 THC  50 Performed at [facility], [place] [address], [place], [place] [address]   Lupus anticoagulant panel     Status: None   Collection Time: 01/21/21  7:01 PM  Result Value Ref Range   PTT Lupus Anticoagulant 39.3 0.0 - 51.9 sec   DRVVT 35.7 0.0 - 47.0 sec   Lupus Anticoag Interp Comment:     Comment: (NOTE) No lupus anticoagulant was detected. Performed At: [facility] [place], [place] [id number] Rush Farmer MD IO:[id number]   Beta-2-glycoprotein i abs, IgG/M/A     Status: None   Collection Time: 01/21/21  7:01 PM  Result Value Ref Range   Beta-2 Glyco I IgG <9 0 - 20 GPI IgG units    Comment: (NOTE) The reference interval reflects a 3SD or 99th percentile interval, which is thought to represent a potentially clinically significant result in accordance with the International Consensus Statement on the  classification criteria for definitive antiphospholipid syndrome (APS). J Thromb Haem 2006;4:295-306.    Beta-2-Glycoprotein I IgM <9 0 - 32 GPI IgM units    Comment: (NOTE) The reference interval reflects a 3SD or 99th percentile interval, which is thought to represent a potentially clinically significant result in accordance with the International Consensus Statement on the classification criteria for definitive antiphospholipid syndrome (APS). J Thromb Haem 2006;4:295-306. Performed At: [facility] [place], [place] [id number] Rush Farmer MD IW:[id number]    Beta-2-Glycoprotein I IgA <9 0 - 25 GPI IgA units    Comment: (NOTE) The reference interval reflects a 3SD or 99th percentile interval, which is thought to represent a potentially clinically significant result in accordance with the International Consensus Statement on the classification criteria for definitive antiphospholipid syndrome (APS). J Thromb Haem 2006;4:295-306.   Homocysteine, serum     Status: None   Collection Time: 01/21/21  7:01 PM  Result Value Ref Range   Homocysteine 10.4 0.0 - 14.5 umol/L    Comment: (NOTE) Performed At: [facility] [place], [place] [id number] Rush Farmer MD [id number]   Cardiolipin antibodies, IgG, IgM, IgA     Status: None   Collection Time: 01/21/21  7:01 PM  Result Value Ref Range   Anticardiolipin IgG <9 0 - 14 GPL U/mL    Comment: (NOTE)                          Negative:              <15                          Indeterminate:     15 - 20                          Low-Med Positive: >20 - 80                          High Positive:         >80    Anticardiolipin IgM 10 0 - 12 MPL U/mL    Comment: (NOTE)                          Negative:              <13  Indeterminate:     13 - 20                          Low-Med Positive: >20 - 80                          High Positive:         >80     Anticardiolipin IgA <9 0 - 11 APL U/mL    Comment: (NOTE)                          Negative:              <12                          Indeterminate:     12 - 20                          Low-Med Positive: >20 - 80                          High Positive:         >80 Performed At: [facility] [address] [place], [place] [id number] Rush Farmer MD [id number]   ANA, IFA (with reflex)     Status: None   Collection Time: 01/21/21  7:01 PM  Result Value Ref Range   ANA Ab, IFA Negative     Comment: (NOTE)                                     Negative   <1:80                                     Borderline  1:80                                     Positive   >1:80 ICAP nomenclature: AC-0 For more information about Hep-2 cell patterns use ANApatterns.org, the official website for the International Consensus on Antinuclear Antibody (ANA) Patterns (ICAP). Performed At: [facility] [place], [place] [id number] Rush Farmer MD [id number]   TSH     Status: None   Collection Time: 01/21/21  7:01 PM  Result Value Ref Range   TSH 0.520 0.350 - 4.500 uIU/mL    Comment: Performed by a 3rd Generation assay with a functional sensitivity of <=0.01 uIU/mL. Performed at [facility], [place] [address] [place], [place] [address]   RPR     Status: None   Collection Time: 01/21/21  7:01 PM  Result Value Ref Range   RPR Ser Ql NON REACTIVE NON REACTIVE    Comment: Performed at [facility] [address] [place], [place] [address]  Vitamin B12     Status: None   Collection Time: 01/21/21  7:01 PM  Result Value Ref Range   Vitamin B-12 310 180 - 914 pg/mL    Comment: (NOTE) This assay is not validated for testing neonatal or myeloproliferative syndrome specimens for Vitamin B12  levels. Performed at [facility], [place] [address] [place], [place] [address]      Psychiatric Specialty Exam: Physical Exam  Review of Systems  Weight 207  lb (93.9 kg).There is no height or weight on file to calculate BMI.  General Appearance: NA  Eye Contact:  NA  Speech:  Slow  Volume:  Decreased  Mood:  Anxious  Affect:  NA  Thought Process:  Goal Directed  Orientation:  Full (Time, Place, and Person)  Thought Content:  Hallucinations: Auditory Chronic hallucination.  Suicidal Thoughts:  No  Homicidal Thoughts:  No  Memory:  Immediate;   Fair Recent;   Fair Remote;   Fair  Judgement:  Intact  Insight:  Shallow  Psychomotor Activity:  NA  Concentration:  Concentration: Fair and Attention Span: Fair  Recall:  [employer] of Knowledge:  Fair  Language:  Fair  Akathisia:  No  Handed:  Right  AIMS (if indicated):     Assets:  Communication Skills Desire for Improvement Housing Transportation  ADL's:  Intact  Cognition:  WNL  Sleep:   ok      Assessment and Plan: Hallucination.  PTSD.  Anxiety.  Major depressive disorder, recurrent.  I reviewed notes from recent medical hospitalization and notes from consultation liaison services.  I also reviewed blood work results.  Patient feels a combination of Cymbalta Xanax and Symbyax helping her a lot.  She preferred to take Cymbalta 1 pill as recently we had increased to take Cymbalta 30 mg twice a day.  She feels 60 mg helping her.  We will continue Symbyax 6/25 mg at 5 PM along with Cymbalta 60 mg in the evening and Xanax 0.5 mg daily and occasionally she can take second if she feels very nervous and anxious.  I encouraged to keep appointment with Sanjuana Kava.  Recommended to call us back if she has any question or any concern.  Follow-up in 2 months.  Follow Up Instructions:    I discussed the assessment and treatment plan with the patient. The patient was provided an opportunity to ask questions and all were answered. The patient agreed with the plan and demonstrated an understanding of the instructions.   The patient was advised to call back or seek an in-person evaluation  if the symptoms worsen or if the condition fails to improve as anticipated.  I provided 26 minutes of non-face-to-face time during this encounter.   [name], MD

## 2021-02-15 ENCOUNTER — Other Ambulatory Visit (HOSPITAL_COMMUNITY): Payer: Self-pay

## 2021-02-15 MED ORDER — UBRELVY 100 MG PO TABS
ORAL_TABLET | ORAL | 1 refills | Status: DC
  Filled 2021-02-15 (×2): qty 10, 30d supply, fill #0
  Filled 2021-02-15: qty 18, 30d supply, fill #0
  Filled 2021-07-10: qty 10, 30d supply, fill #1
  Filled 2022-01-09: qty 10, 30d supply, fill #2

## 2021-02-15 MED ORDER — EMGALITY 120 MG/ML ~~LOC~~ SOSY
PREFILLED_SYRINGE | SUBCUTANEOUS | 6 refills | Status: DC
  Filled 2021-02-15: qty 1, 30d supply, fill #0
  Filled 2021-02-15: qty 1, 28d supply, fill #0
  Filled 2021-03-13: qty 1, 28d supply, fill #1
  Filled 2021-04-09: qty 1, 28d supply, fill #2
  Filled 2021-05-08: qty 1, 28d supply, fill #3
  Filled 2021-06-12: qty 1, 28d supply, fill #4
  Filled 2021-07-10: qty 1, 28d supply, fill #5
  Filled 2021-08-08: qty 1, 28d supply, fill #6

## 2021-02-16 ENCOUNTER — Encounter: Payer: Self-pay | Admitting: Gastroenterology

## 2021-02-16 ENCOUNTER — Ambulatory Visit: Payer: Self-pay | Admitting: Nurse Practitioner

## 2021-02-16 ENCOUNTER — Ambulatory Visit (INDEPENDENT_AMBULATORY_CARE_PROVIDER_SITE_OTHER): Payer: 59 | Admitting: Clinical

## 2021-02-16 ENCOUNTER — Other Ambulatory Visit (HOSPITAL_COMMUNITY): Payer: Self-pay

## 2021-02-16 ENCOUNTER — Other Ambulatory Visit: Payer: Self-pay

## 2021-02-16 DIAGNOSIS — F331 Major depressive disorder, recurrent, moderate: Secondary | ICD-10-CM | POA: Diagnosis not present

## 2021-02-16 DIAGNOSIS — F419 Anxiety disorder, unspecified: Secondary | ICD-10-CM | POA: Diagnosis not present

## 2021-02-16 DIAGNOSIS — F4310 Post-traumatic stress disorder, unspecified: Secondary | ICD-10-CM | POA: Diagnosis not present

## 2021-02-16 NOTE — Progress Notes (Signed)
   THERAPIST PROGRESS NOTE  Session Time: 8am  Participation Level: Active  Behavioral Response: NAAlertEuthymic  Type of Therapy: Individual Therapy  Treatment Goals addressed: Anxiety and Coping  Interventions: Supportive Virtual Visit via Telephone Note  I connected with Desiree Russell on 02/16/21 at  8:00 AM EDT by telephone and verified that I am speaking with the correct person using two identifiers.  Location: Patient: work Provider: office   I discussed the limitations, risks, security and privacy concerns of performing an evaluation and management service by telephone and the availability of in person appointments. I also discussed with the patient that there may be a patient responsible charge related to this service. The patient expressed understanding and agreed to proceed.   I discussed the assessment and treatment plan with the patient. The patient was provided an opportunity to ask questions and all were answered. The patient agreed with the plan and demonstrated an understanding of the instructions.   The patient was advised to call back or seek an in-person evaluation if the symptoms worsen or if the condition fails to improve as anticipated.  I provided 35 minutes of non-face-to-face time during this encounter.   Summary: Desiree Russell is a 50 y.o. female who presents in euthymic mood. Pt reports her daughter recently got married and she is adjusting to her no longer living in the home. Pt says her relationship with her husband is improving. Pt reports having a strained relationship with her eldest son and says he has not spoken to her in one month. Pt denies any AH/VH and reports she is thinking more clearly. Pt states she is feeling more comfortable traveling to places alone. Pt states decrease in anxiety and says she has found using grounding techniques helpful. Pt reports she has returned to work and her health is improving.  Suicidal/Homicidal:  Pt denies SI/HI no  plan or intent to harm self or others reported.  Therapist Response: CSW verbally praised pt for progress she is making. CSW assessed for changes in mood, behavior and daily functioning. CSW discussed with pt stress management techniques and things that protect against stress.CSW prompted pt to identify positive experiences that make her happy and coping strategies that reduce or manage stress or unwanted emotions.  Plan: Return again in 2 weeks.  Diagnosis: Axis I: PTSD                                     Moderate episode major depressive disorder                                     Anxiety    Axis II: No diagnosis    Yvette Rack, LCSW 02/16/2021

## 2021-02-20 ENCOUNTER — Other Ambulatory Visit (HOSPITAL_COMMUNITY): Payer: Self-pay

## 2021-02-20 ENCOUNTER — Ambulatory Visit: Payer: 59 | Admitting: Neurology

## 2021-02-20 ENCOUNTER — Encounter: Payer: Self-pay | Admitting: Gastroenterology

## 2021-02-22 ENCOUNTER — Other Ambulatory Visit (HOSPITAL_COMMUNITY): Payer: Self-pay

## 2021-02-22 ENCOUNTER — Other Ambulatory Visit (HOSPITAL_COMMUNITY): Payer: Self-pay | Admitting: Nurse Practitioner

## 2021-02-22 MED ORDER — GABAPENTIN 300 MG PO CAPS
300.0000 mg | ORAL_CAPSULE | Freq: Every day | ORAL | 2 refills | Status: DC
  Filled 2021-02-22: qty 90, 90d supply, fill #0

## 2021-02-22 NOTE — Progress Notes (Signed)
   [facility] [address] [place], [place]  [address] Phone:  [phone]   Fax:  [phone] 

## 2021-02-24 ENCOUNTER — Other Ambulatory Visit (HOSPITAL_COMMUNITY): Payer: Self-pay

## 2021-02-24 ENCOUNTER — Other Ambulatory Visit: Payer: Self-pay

## 2021-02-24 DIAGNOSIS — M5416 Radiculopathy, lumbar region: Secondary | ICD-10-CM | POA: Diagnosis not present

## 2021-02-24 DIAGNOSIS — M62830 Muscle spasm of back: Secondary | ICD-10-CM | POA: Diagnosis not present

## 2021-02-24 DIAGNOSIS — M5136 Other intervertebral disc degeneration, lumbar region: Secondary | ICD-10-CM | POA: Diagnosis not present

## 2021-02-24 MED ORDER — METHOCARBAMOL 500 MG PO TABS
ORAL_TABLET | ORAL | 0 refills | Status: DC
  Filled 2021-02-24: qty 30, 30d supply, fill #0

## 2021-02-24 MED ORDER — METHOCARBAMOL 500 MG PO TABS
ORAL_TABLET | ORAL | 5 refills | Status: DC
  Filled 2021-02-24: qty 30, 30d supply, fill #0

## 2021-02-25 ENCOUNTER — Other Ambulatory Visit (HOSPITAL_COMMUNITY): Payer: Self-pay

## 2021-03-10 ENCOUNTER — Emergency Department (HOSPITAL_COMMUNITY): Payer: 59

## 2021-03-10 ENCOUNTER — Other Ambulatory Visit: Payer: Self-pay

## 2021-03-10 ENCOUNTER — Encounter (HOSPITAL_COMMUNITY): Payer: Self-pay | Admitting: Emergency Medicine

## 2021-03-10 ENCOUNTER — Emergency Department (HOSPITAL_COMMUNITY)
Admission: EM | Admit: 2021-03-10 | Discharge: 2021-03-10 | Disposition: A | Discharge status: Home / Self Care | Payer: 59 | Attending: Emergency Medicine | Admitting: Emergency Medicine

## 2021-03-10 DIAGNOSIS — I639 Cerebral infarction, unspecified: Secondary | ICD-10-CM | POA: Insufficient documentation

## 2021-03-10 DIAGNOSIS — M47816 Spondylosis without myelopathy or radiculopathy, lumbar region: Secondary | ICD-10-CM | POA: Diagnosis not present

## 2021-03-10 DIAGNOSIS — M5127 Other intervertebral disc displacement, lumbosacral region: Secondary | ICD-10-CM

## 2021-03-10 DIAGNOSIS — I10 Essential (primary) hypertension: Secondary | ICD-10-CM | POA: Diagnosis not present

## 2021-03-10 DIAGNOSIS — Z8616 Personal history of COVID-19: Secondary | ICD-10-CM | POA: Insufficient documentation

## 2021-03-10 DIAGNOSIS — R531 Weakness: Secondary | ICD-10-CM | POA: Insufficient documentation

## 2021-03-10 DIAGNOSIS — R519 Headache, unspecified: Secondary | ICD-10-CM | POA: Insufficient documentation

## 2021-03-10 DIAGNOSIS — R42 Dizziness and giddiness: Secondary | ICD-10-CM | POA: Diagnosis not present

## 2021-03-10 DIAGNOSIS — Z7902 Long term (current) use of antithrombotics/antiplatelets: Secondary | ICD-10-CM | POA: Diagnosis not present

## 2021-03-10 DIAGNOSIS — M5459 Other low back pain: Secondary | ICD-10-CM | POA: Diagnosis not present

## 2021-03-10 DIAGNOSIS — Y900 Blood alcohol level of less than 20 mg/100 ml: Secondary | ICD-10-CM | POA: Insufficient documentation

## 2021-03-10 DIAGNOSIS — Z79899 Other long term (current) drug therapy: Secondary | ICD-10-CM | POA: Diagnosis not present

## 2021-03-10 DIAGNOSIS — M47817 Spondylosis without myelopathy or radiculopathy, lumbosacral region: Secondary | ICD-10-CM | POA: Diagnosis not present

## 2021-03-10 DIAGNOSIS — Z20822 Contact with and (suspected) exposure to covid-19: Secondary | ICD-10-CM | POA: Diagnosis not present

## 2021-03-10 DIAGNOSIS — G319 Degenerative disease of nervous system, unspecified: Secondary | ICD-10-CM | POA: Diagnosis not present

## 2021-03-10 DIAGNOSIS — Z7982 Long term (current) use of aspirin: Secondary | ICD-10-CM | POA: Diagnosis not present

## 2021-03-10 DIAGNOSIS — R4701 Aphasia: Secondary | ICD-10-CM | POA: Diagnosis not present

## 2021-03-10 DIAGNOSIS — R4702 Dysphasia: Secondary | ICD-10-CM | POA: Diagnosis not present

## 2021-03-10 DIAGNOSIS — K458 Other specified abdominal hernia without obstruction or gangrene: Secondary | ICD-10-CM | POA: Insufficient documentation

## 2021-03-10 DIAGNOSIS — Q283 Other malformations of cerebral vessels: Secondary | ICD-10-CM | POA: Diagnosis not present

## 2021-03-10 DIAGNOSIS — G4489 Other headache syndrome: Secondary | ICD-10-CM | POA: Diagnosis not present

## 2021-03-10 DIAGNOSIS — M545 Low back pain, unspecified: Secondary | ICD-10-CM

## 2021-03-10 LAB — RAPID URINE DRUG SCREEN, HOSP PERFORMED
Amphetamines: NOT DETECTED
Barbiturates: NOT DETECTED
Benzodiazepines: POSITIVE — AB
Cocaine: NOT DETECTED
Opiates: NOT DETECTED
Tetrahydrocannabinol: NOT DETECTED

## 2021-03-10 LAB — DIFFERENTIAL
Abs Immature Granulocytes: 0.02 10*3/uL (ref 0.00–0.07)
Basophils Absolute: 0 10*3/uL (ref 0.0–0.1)
Basophils Relative: 0 %
Eosinophils Absolute: 0.1 10*3/uL (ref 0.0–0.5)
Eosinophils Relative: 1 %
Immature Granulocytes: 0 %
Lymphocytes Relative: 48 %
Lymphs Abs: 2.8 10*3/uL (ref 0.7–4.0)
Monocytes Absolute: 0.6 10*3/uL (ref 0.1–1.0)
Monocytes Relative: 9 %
Neutro Abs: 2.6 10*3/uL (ref 1.7–7.7)
Neutrophils Relative %: 42 %

## 2021-03-10 LAB — URINALYSIS, ROUTINE W REFLEX MICROSCOPIC
Bilirubin Urine: NEGATIVE
Glucose, UA: NEGATIVE mg/dL
Hgb urine dipstick: NEGATIVE
Ketones, ur: NEGATIVE mg/dL
Leukocytes,Ua: NEGATIVE
Nitrite: NEGATIVE
Protein, ur: NEGATIVE mg/dL
Specific Gravity, Urine: 1.008 (ref 1.005–1.030)
pH: 7 (ref 5.0–8.0)

## 2021-03-10 LAB — PROTIME-INR
INR: 1.1 (ref 0.8–1.2)
Prothrombin Time: 13.9 seconds (ref 11.4–15.2)

## 2021-03-10 LAB — BASIC METABOLIC PANEL
Anion gap: 6 (ref 5–15)
BUN: 9 mg/dL (ref 6–20)
CO2: 24 mmol/L (ref 22–32)
Calcium: 8.9 mg/dL (ref 8.9–10.3)
Chloride: 107 mmol/L (ref 98–111)
Creatinine, Ser: 0.9 mg/dL (ref 0.44–1.00)
GFR, Estimated: >60 mL/min (ref >60)
Glucose, Bld: 84 mg/dL (ref 70–99)
Potassium: 4.3 mmol/L (ref 3.5–5.1)
Sodium: 137 mmol/L (ref 135–145)

## 2021-03-10 LAB — CBC
HCT: 37.9 % (ref 36.0–46.0)
Hemoglobin: 12.7 g/dL (ref 12.0–15.0)
MCH: 30.6 pg (ref 26.0–34.0)
MCHC: 33.5 g/dL (ref 30.0–36.0)
MCV: 91.3 fL (ref 80.0–100.0)
Platelets: 271 10*3/uL (ref 150–400)
RBC: 4.15 MIL/uL (ref 3.87–5.11)
RDW: 13.9 % (ref 11.5–15.5)
WBC: 6.1 10*3/uL (ref 4.0–10.5)
nRBC: 0 % (ref 0.0–0.2)

## 2021-03-10 LAB — HEPATIC FUNCTION PANEL
ALT: 15 U/L (ref 0–44)
AST: 19 U/L (ref 15–41)
Albumin: 3.7 g/dL (ref 3.5–5.0)
Alkaline Phosphatase: 41 U/L (ref 38–126)
Bilirubin, Direct: <0.1 mg/dL (ref 0.0–0.2)
Total Bilirubin: 0.5 mg/dL (ref 0.3–1.2)
Total Protein: 6.5 g/dL (ref 6.5–8.1)

## 2021-03-10 LAB — RESP PANEL BY RT-PCR (FLU A&B, COVID) ARPGX2
Influenza A by PCR: NEGATIVE
Influenza B by PCR: NEGATIVE
SARS Coronavirus 2 by RT PCR: NEGATIVE

## 2021-03-10 LAB — ETHANOL: Alcohol, Ethyl (B): <10 mg/dL (ref <10)

## 2021-03-10 LAB — I-STAT BETA HCG BLOOD, ED (MC, WL, AP ONLY): I-stat hCG, quantitative: <5 m[IU]/mL (ref <5)

## 2021-03-10 LAB — CBG MONITORING, ED: Glucose-Capillary: 87 mg/dL (ref 70–99)

## 2021-03-10 IMAGING — MR MR LUMBAR SPINE W/O CM
4 of 5 series · 19 of 48 positions shown · non-contrast
Comparison: [DATE]

CLINICAL DATA: Low back pain, cauda equina syndrome suspected.
Bilateral leg weakness.

EXAM:
MRI LUMBAR SPINE WITHOUT CONTRAST
TECHNIQUE: Multiplanar, multisequence MR imaging of the lumbar spine was
performed. No intravenous contrast was administered.

[Series 3: T2 · sagittal · 4.0mm · 0.55mm/px · 7 of 15 slices shown (1 of 2)]
[im 1/15]
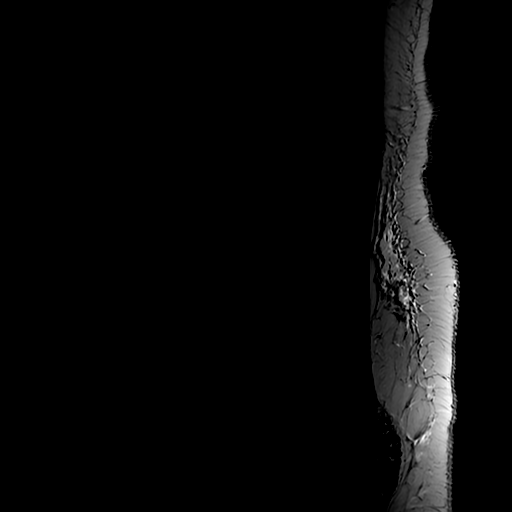
[im 3/15]
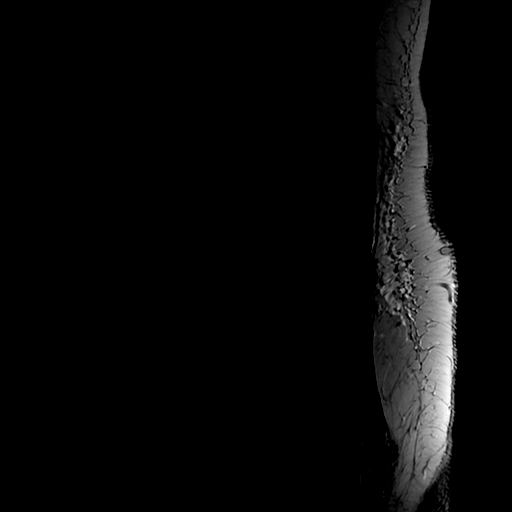
[im 5/15]
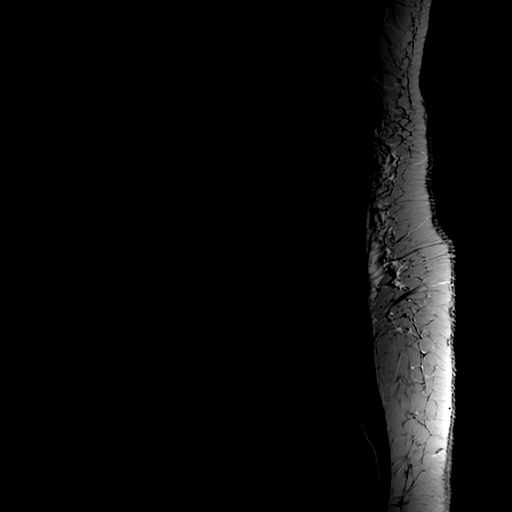
[im 8/15]
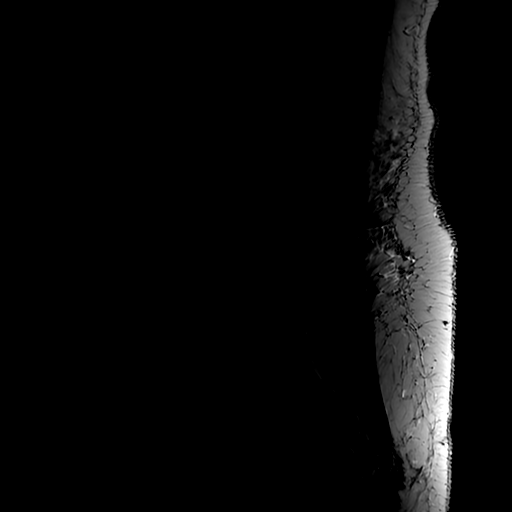
[im 10/15]
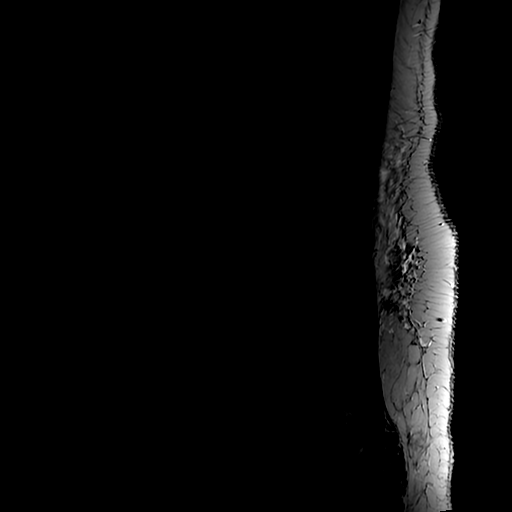
[im 12/15]
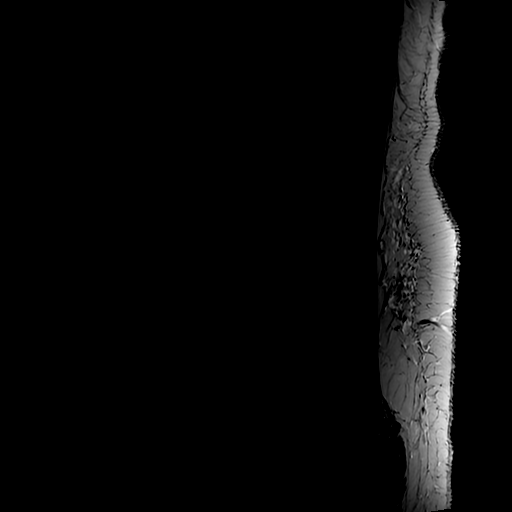
[im 15/15]
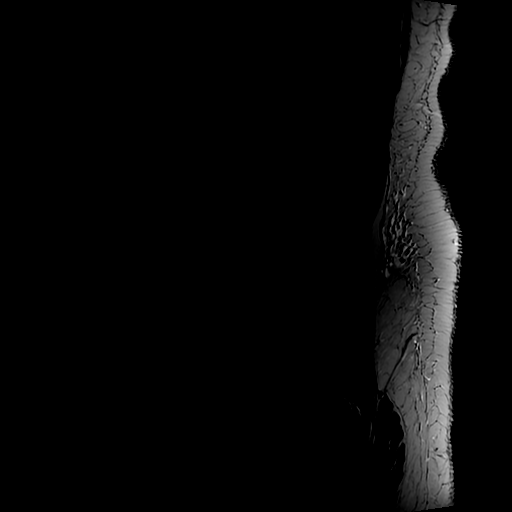

[Series 5: T1 · sagittal · 4.0mm · 0.55mm/px · 3 of 15 slices shown (1 of 2)]
[im 3/15]
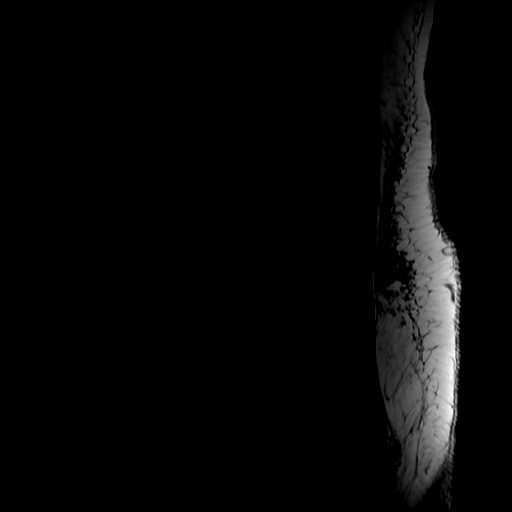
[im 9/15]
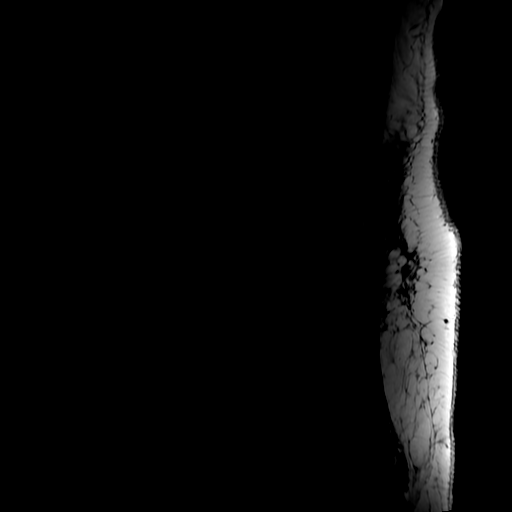
[im 15/15]
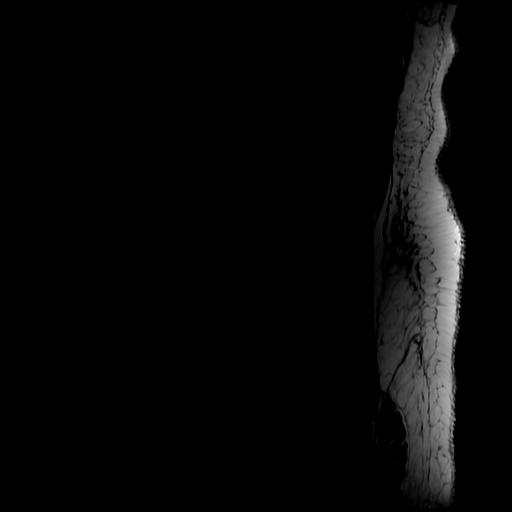

[Series 6: T2 · axial · 4.0mm · 0.39mm/px · z∈[+16,+172]mm · 6 of 34 slices shown (2 of 2)]
[im 1/34]
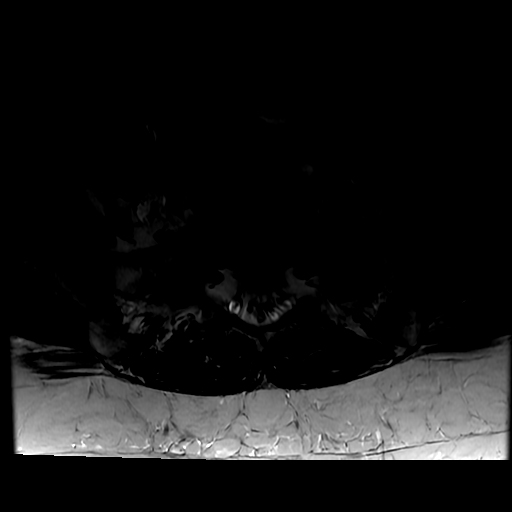
[im 6/34]
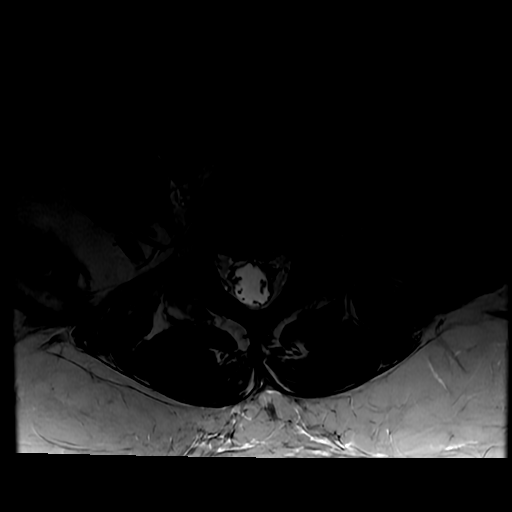
[im 11/34]
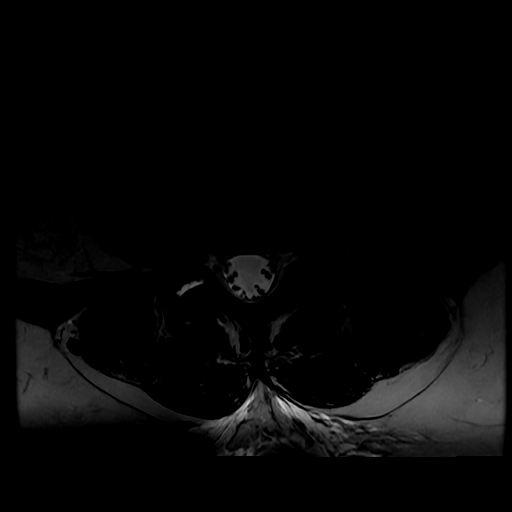
[im 16/34]
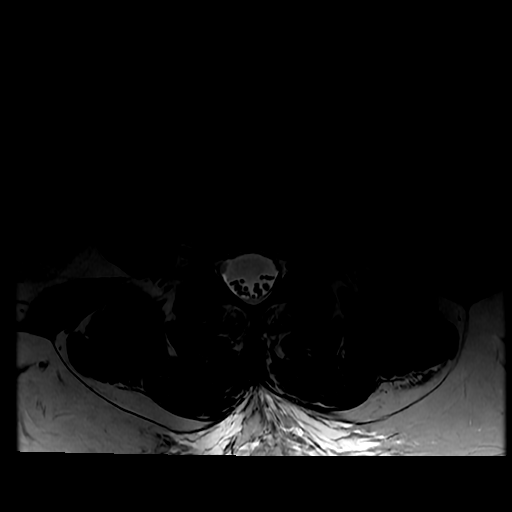
[im 18/34]
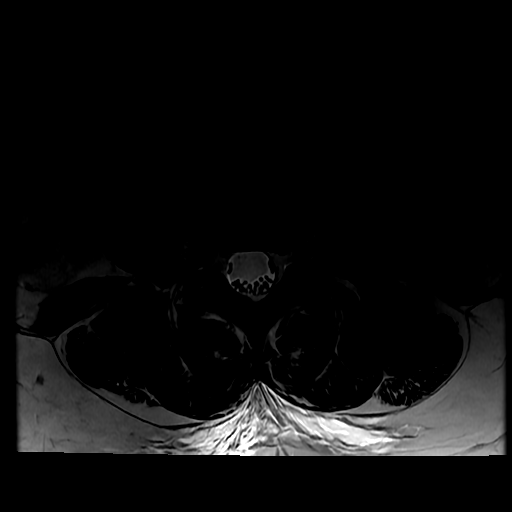
[im 28/34]
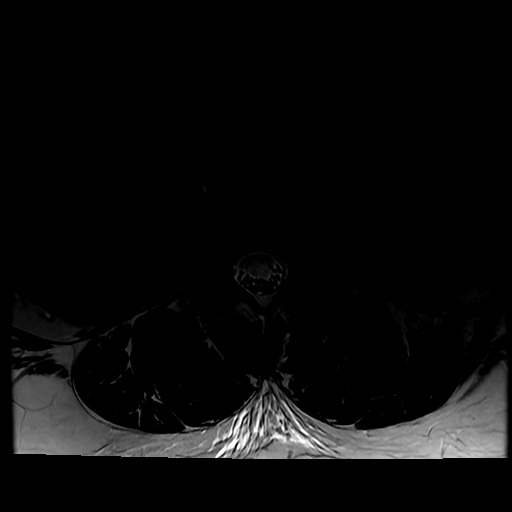

[Series 7: T1 · axial · 4.0mm · 0.39mm/px · z∈[+40,+172]mm · 3 of 34 slices shown (2 of 2)]
[im 6/34]
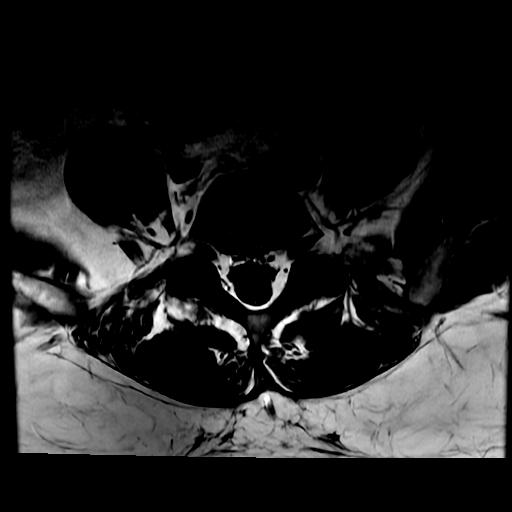
[im 18/34]
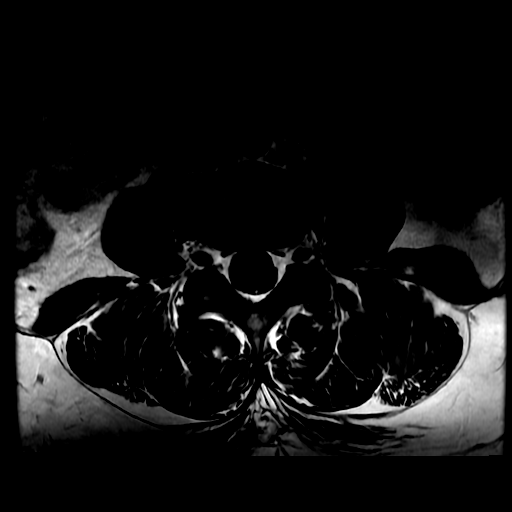
[im 28/34]
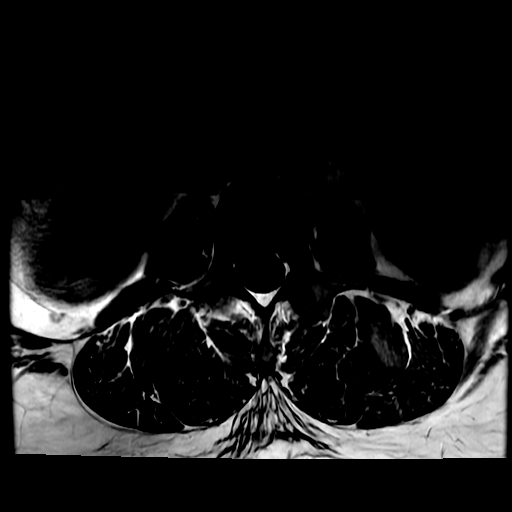

[19 of 48 positions shown; findings below may reference images not displayed]

FINDINGS: Segmentation:  Standard.

Alignment:  Normal.

Vertebrae: No fracture, suspicious marrow lesion, or significant
marrow edema.

Conus medullaris and cauda equina: Conus extends to the L1-2 level.
Conus and cauda equina appear normal.

Paraspinal and other soft tissues: Unremarkable.

Disc levels:

The lumbar spinal canal is capacious. Disc height and hydration are
maintained from T11-12 to L4-5. At L5-S1, there is disc desiccation,
minimal disc bulging, a shallow right central disc protrusion with
annular fissure without associated stenosis.

Lower lumbar facet arthrosis is again noted, moderate to severe on
the right at L4-5 and moderate bilaterally at L5-S1. Right-sided
facet arthrosis at L4-5 has likely mildly progressed, and there is a
joint effusion which is larger than on the prior study.
IMPRESSION: 1. Unchanged minimal disc bulging and shallow right central disc
protrusion at L5-S1 without stenosis.
2. Lower lumbar facet arthrosis, most severe on the right at L4-5.

## 2021-03-10 IMAGING — CT CT HEAD CODE STROKE
3 series · 16 of 47 positions shown, 19 images · non-contrast
Comparison: Brain MRI [DATE].  Head CT [DATE].

CLINICAL DATA: Code stroke. 49-year-old female with acute onset
weakness and aphasia.

EXAM:
CT HEAD WITHOUT CONTRAST
TECHNIQUE: Contiguous axial images were obtained from the base of the skull
through the vertex without intravenous contrast.

[Series 3: head 5.0 st · axial · 0.48mm/px · z∈[-112,+33]mm · 10 of 35 slices shown, 13 images]
[im 3/35  brain]
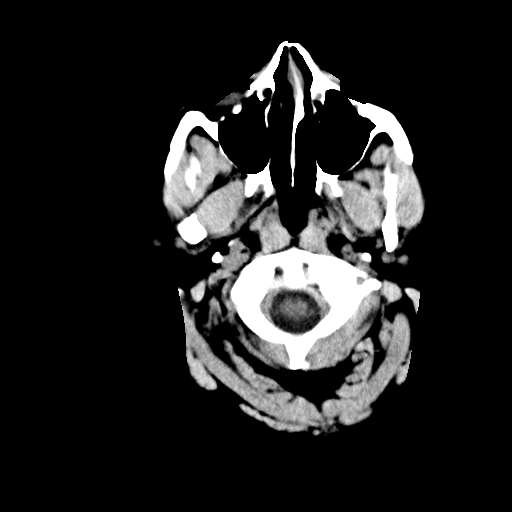
[im 3/35  bone]
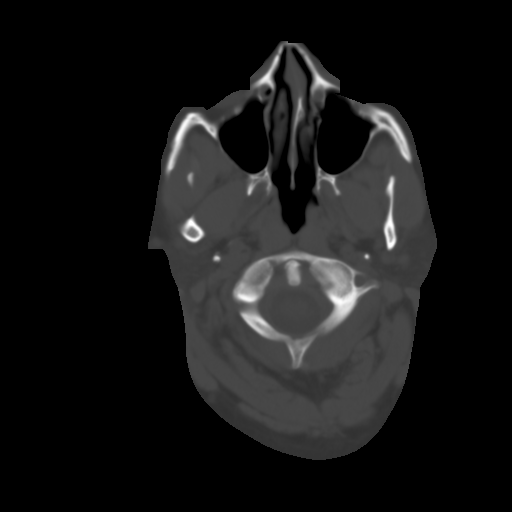
[im 6/35  brain]
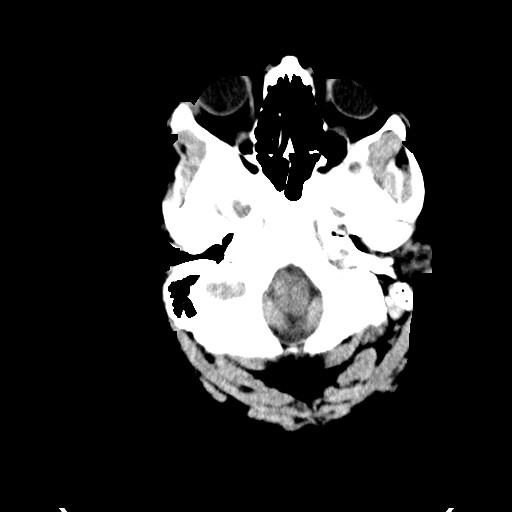
[im 10/35  brain]
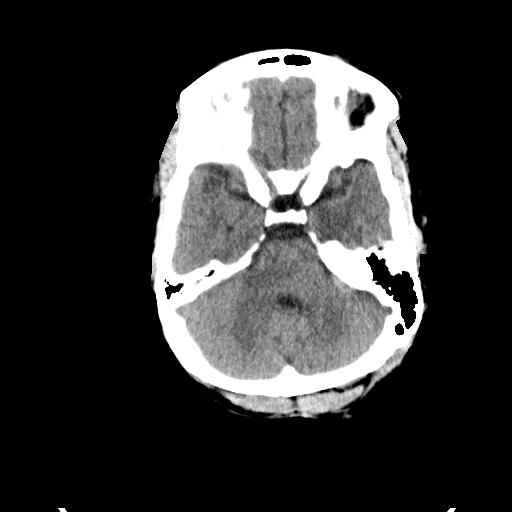
[im 12/35  brain]
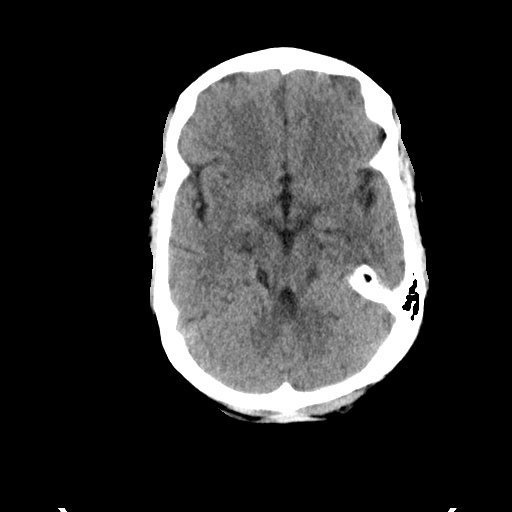
[im 16/35  brain]
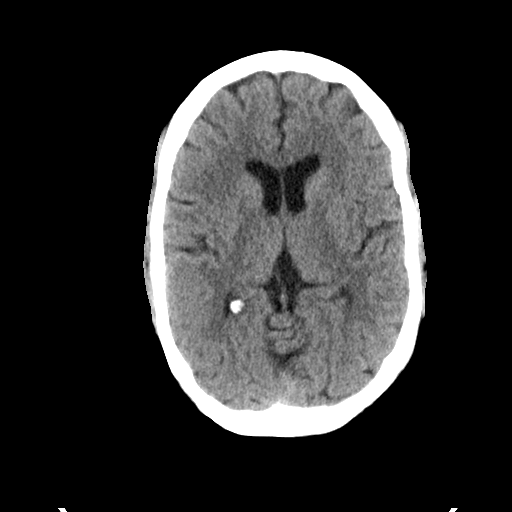
[im 16/35  bone]
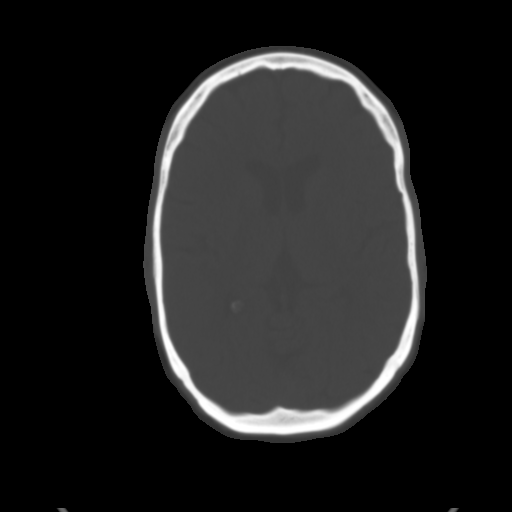
[im 19/35  brain]
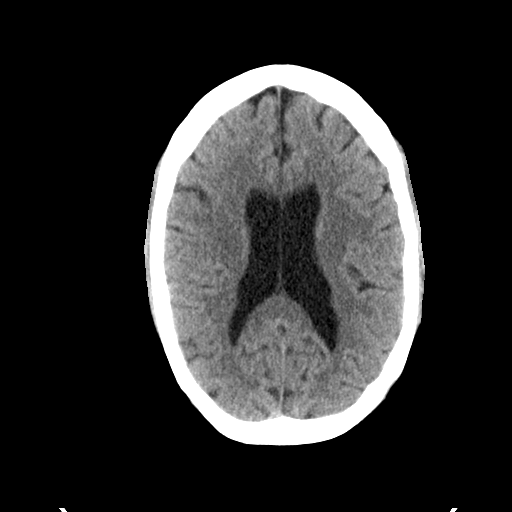
[im 23/35  brain]
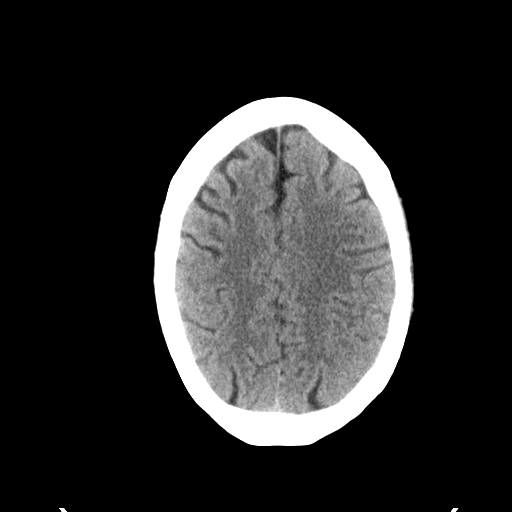
[im 26/35  brain]
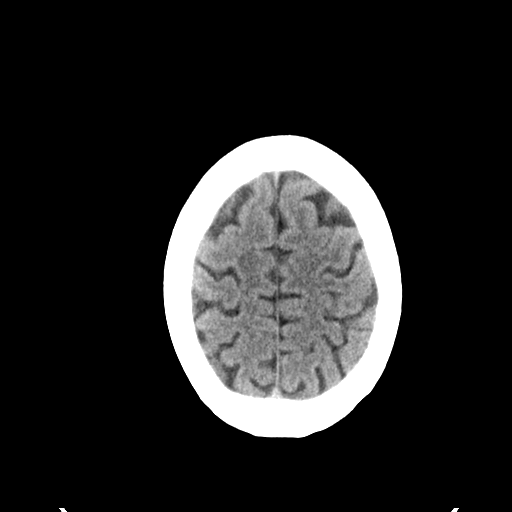
[im 29/35  brain]
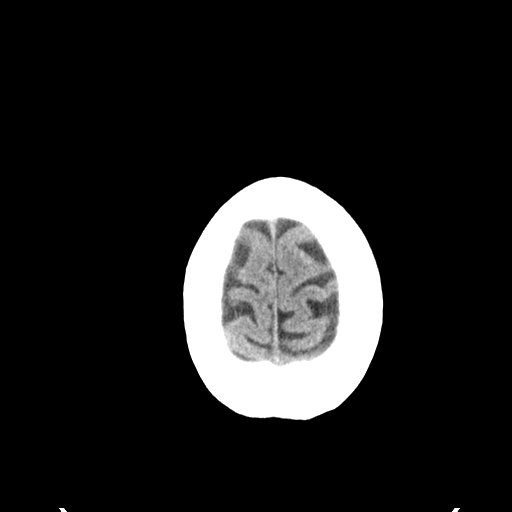
[im 29/35  bone]
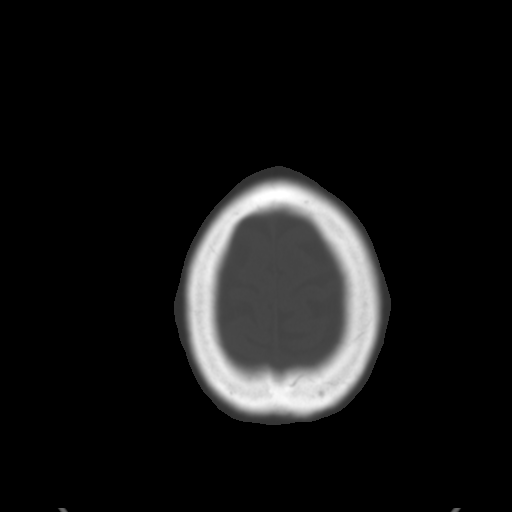
[im 32/35  brain]
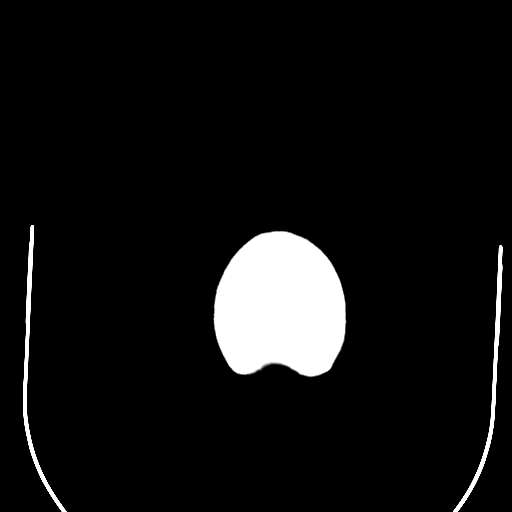

[Series 5: head 3.0 cor st · coronal · 0.36mm/px · 3 of 70 slices shown]
[im 24/70  brain]
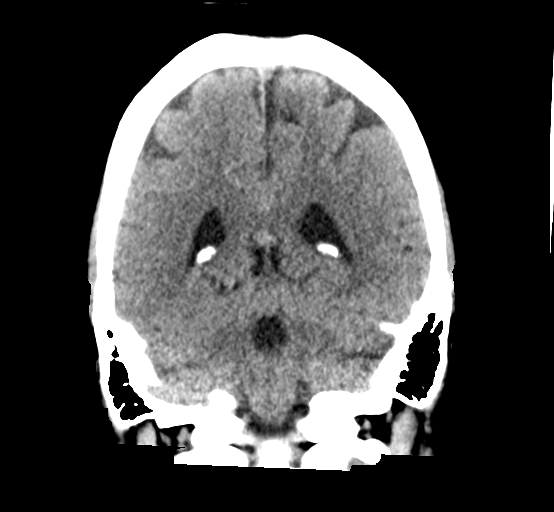
[im 31/70  brain]
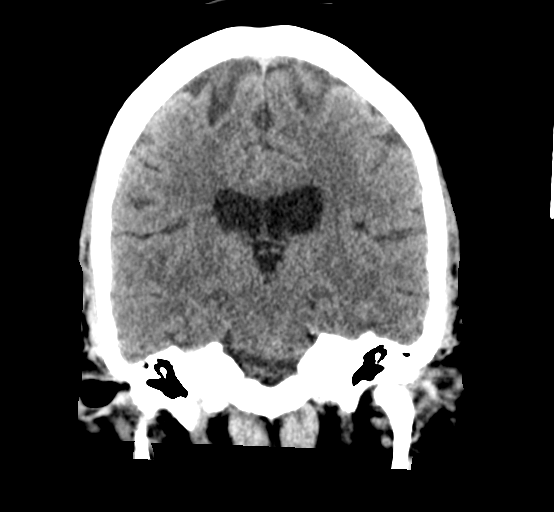
[im 39/70  brain]
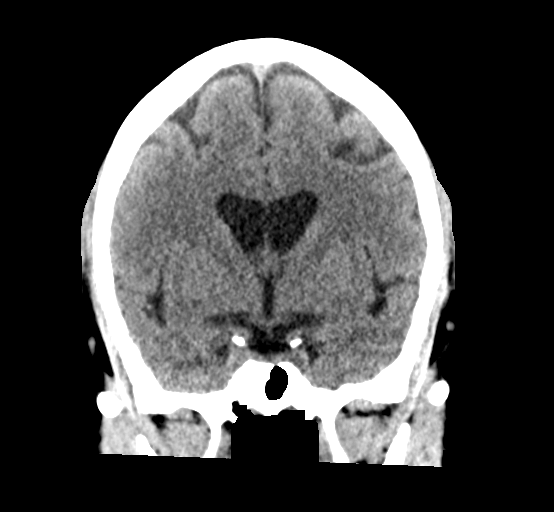

[Series 6: head 3.0 sag st · sagittal · 0.35mm/px · 3 of 67 slices shown]
[im 23/67  brain]
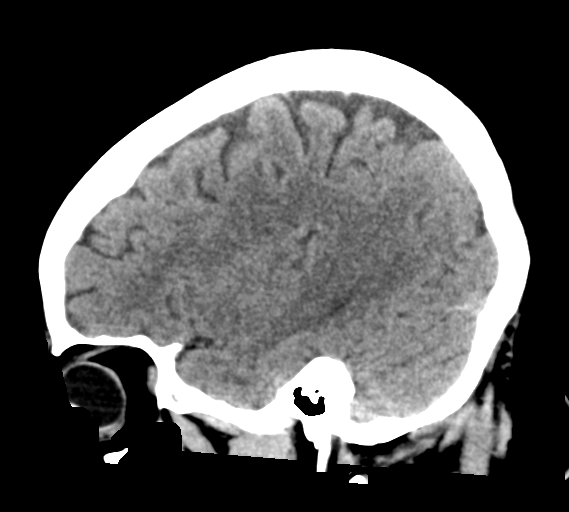
[im 34/67  brain]
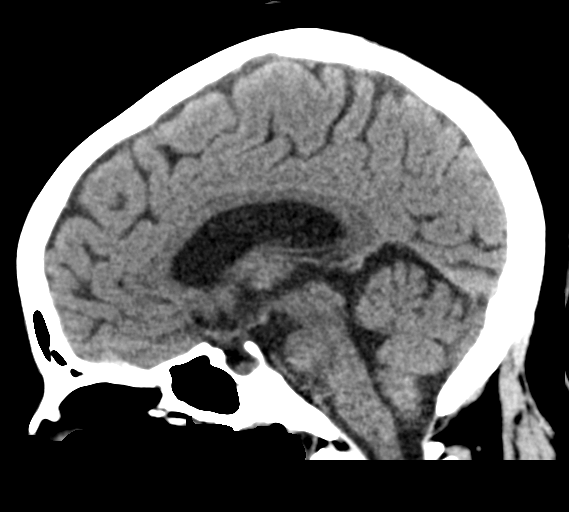
[im 45/67  brain]
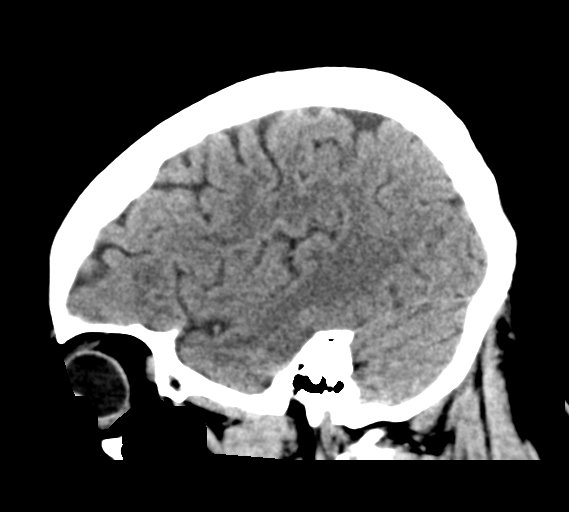

[16 of 47 positions shown; findings below may reference images not displayed]

FINDINGS: Brain: No midline shift, ventriculomegaly, mass effect, evidence of
mass lesion, intracranial hemorrhage or evidence of cortically based
acute infarction. Gray-white matter differentiation appears stable
and within normal limits. No cortical encephalomalacia identified,
but multiple small chronic infarcts in the left cerebellum were
demonstrated by MRI in [REDACTED].

Vascular: No suspicious intracranial vascular hyperdensity.

Skull: Negative skull. Congenital incomplete ossification of the
posterior C1 ring.

Sinuses/Orbits: Visualized paranasal sinuses and mastoids are stable
and well aerated.

Other: Stable mildly Disconjugate gaze. Visualized scalp soft
tissues are within normal limits.

ASPECTS (Alberta Stroke Program Early CT Score)

Total score (0-10 with 10 being normal): 10
IMPRESSION: 1. No acute cortically based infarct or acute intracranial
hemorrhage identified. ASPECTS 10.
2. Multiple small chronic left cerebellar infarcts better
demonstrated by MRI.
3. These results were communicated to Dr. BARAJAS at [DATE] on
[DATE] by text page via the [employer] messaging system.

## 2021-03-10 IMAGING — MR MR HEAD W/O CM
6 of 10 series · 27 of 48 positions shown · non-contrast
Comparison: Head CT [DATE] and MRI [DATE]

CLINICAL DATA: Neuro deficit, acute, stroke suspected. Aphasia,
right arm weakness, and bilateral leg weakness. History of stroke.

EXAM:
MRI HEAD WITHOUT CONTRAST
TECHNIQUE: Multiplanar, multiecho pulse sequences of the brain and surrounding
structures were obtained without intravenous contrast.

[Series 2: DWI · axial · 3.0mm · 0.94mm/px · z∈[-85,+59]mm · 8 of 100 slices shown (1 of 2)]
[im 1/100]
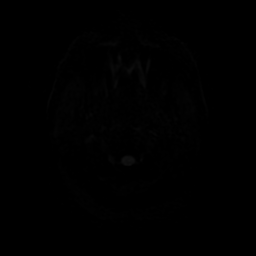
[im 12/100]
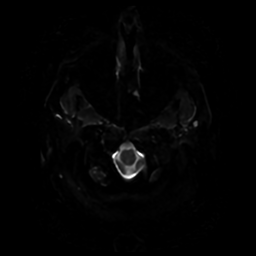
[im 34/100]
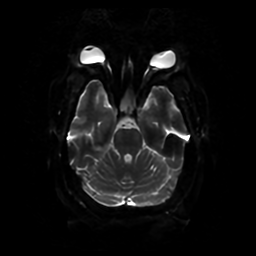
[im 45/100]
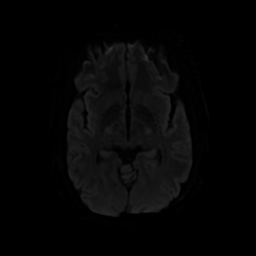
[im 56/100]
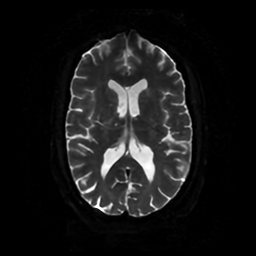
[im 67/100]
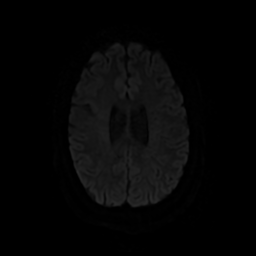
[im 89/100]
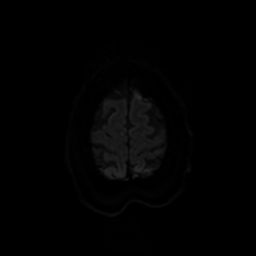
[im 100/100]
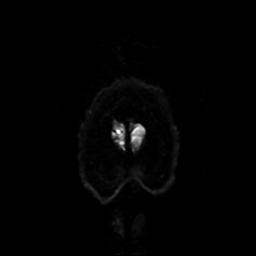

[Series 3: DWI · coronal · 4.0mm · 0.94mm/px · 6 of 70 slices shown (2 of 2)]
[im 1/70]
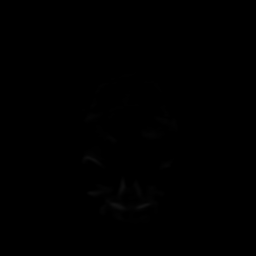
[im 14/70]
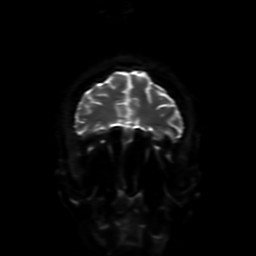
[im 28/70]
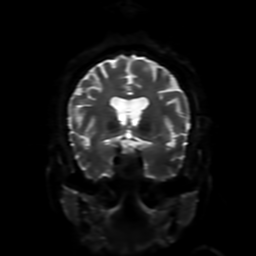
[im 42/70]
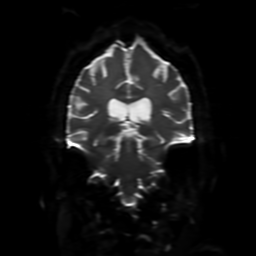
[im 56/70]
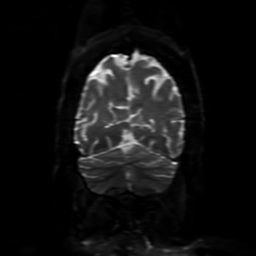
[im 70/70]
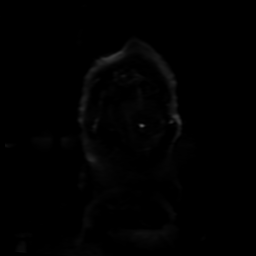

[Series 4: FLAIR · sagittal · 5.0mm · 0.23mm/px · 2 of 23 slices shown (1 of 2)]
[im 1/23]
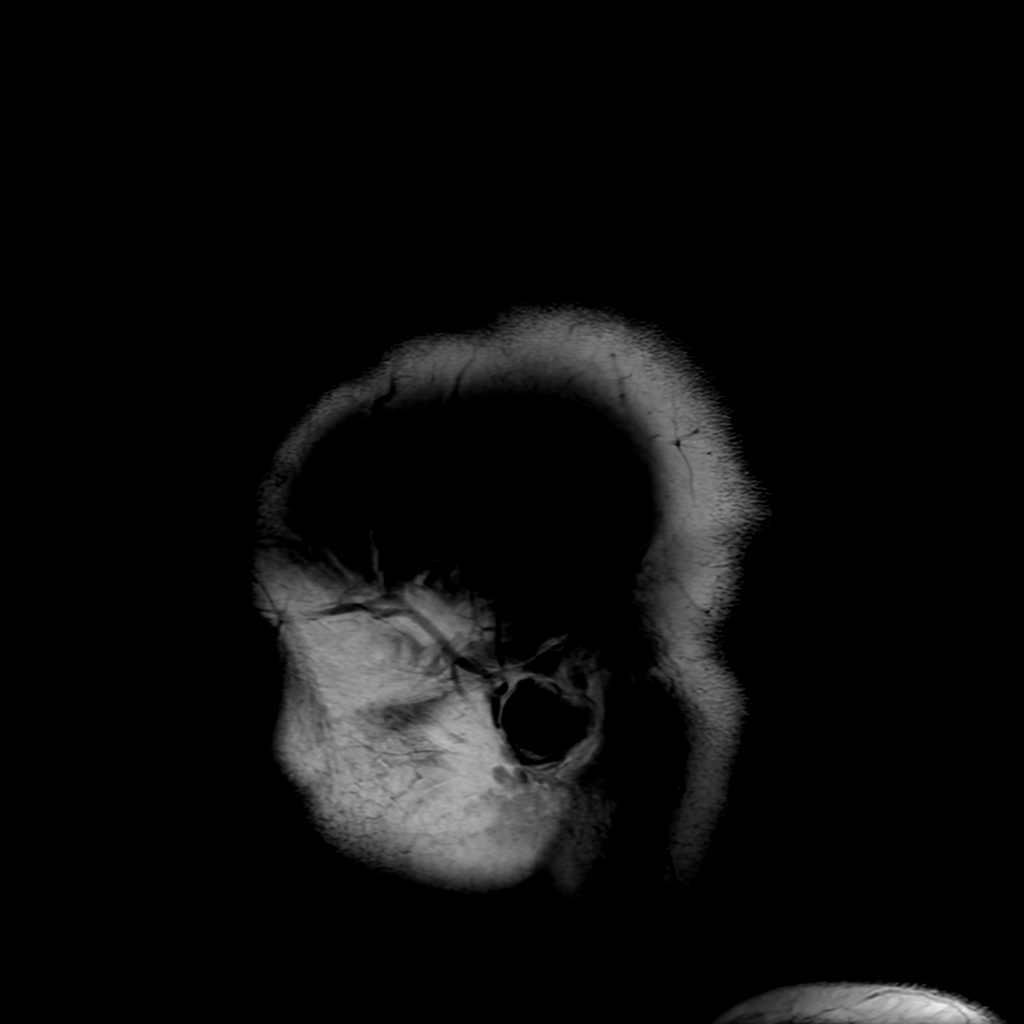
[im 23/23]
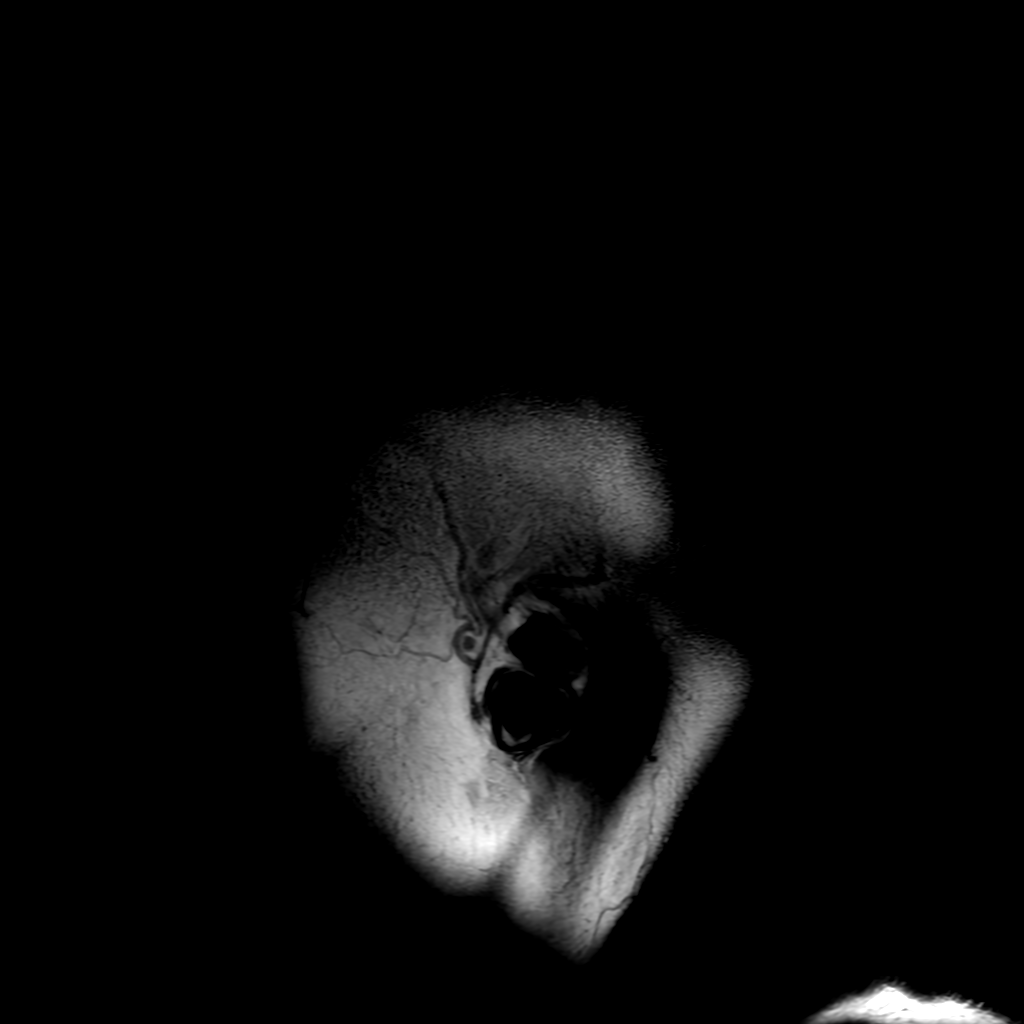

[Series 6: FLAIR · axial · 4.0mm · 0.45mm/px · z∈[-86,+61]mm · 3 of 35 slices shown (2 of 2)]
[im 1/35]
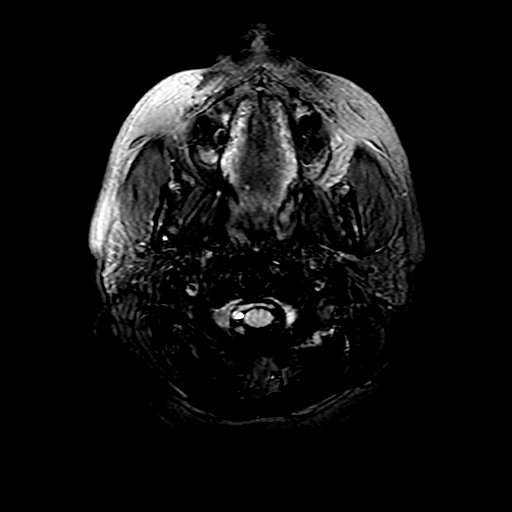
[im 18/35]
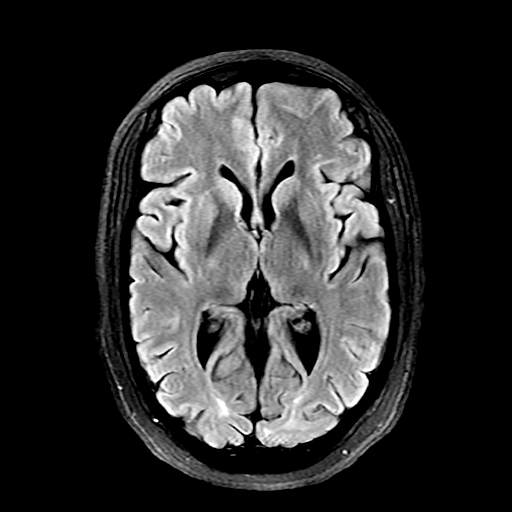
[im 35/35]
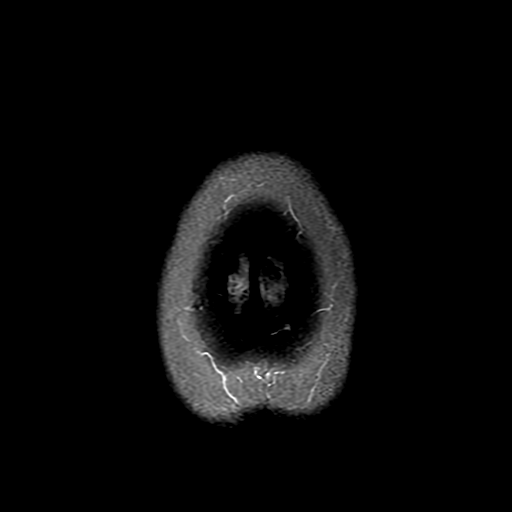

[Series 250: ADC · axial · 3.0mm · 0.94mm/px · z∈[-85,+59]mm · 5 of 50 slices shown (1 of 2)]
[im 1/50]
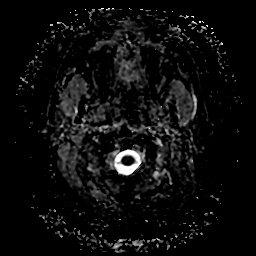
[im 13/50]
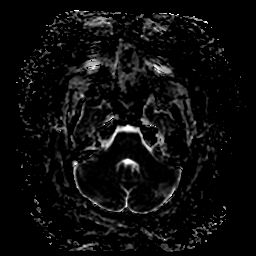
[im 25/50]
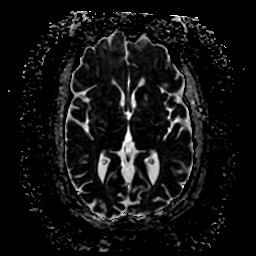
[im 37/50]
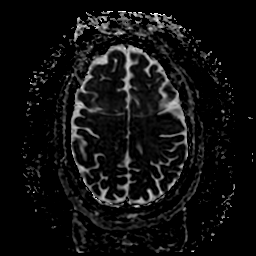
[im 50/50]
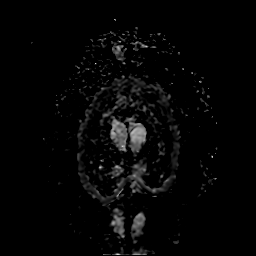

[Series 350: ADC · coronal · 4.0mm · 0.94mm/px · 3 of 34 slices shown (2 of 2)]
[im 1/34]
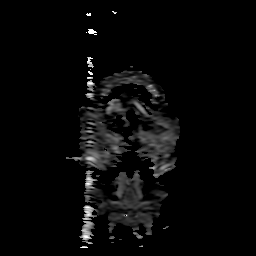
[im 17/34]
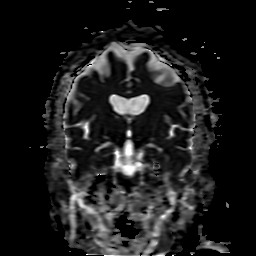
[im 34/34]
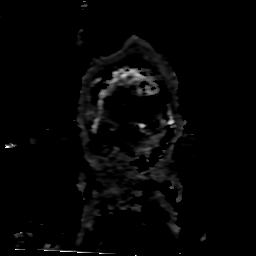

[27 of 48 positions shown; findings below may reference images not displayed]

FINDINGS: Brain: There is no evidence of an acute infarct, intracranial
hemorrhage, mass, midline shift, or extra-axial fluid collection.
Small chronic infarcts in the left greater than right cerebellar
hemispheres are unchanged from the prior MRI, as is a small chronic
right frontal cortical infarct. There is mild cerebral atrophy.
Minimal scattered punctate T2 hyperintensities in the cerebral white
matter bilaterally are unchanged and nonspecific though may reflect
early chronic small vessel ischemia or migraines.

Vascular: Major intracranial vascular flow voids are preserved.

Skull and upper cervical spine: Unremarkable bone marrow signal.

Sinuses/Orbits: Unremarkable orbits. Paranasal sinuses and mastoid
air cells are clear.

Other: None.
IMPRESSION: 1. No acute intracranial abnormality.
2. Chronic cerebellar and right frontal lobe infarcts.

## 2021-03-10 MED ORDER — LORAZEPAM 2 MG/ML IJ SOLN
1.0000 mg | Freq: Once | INTRAMUSCULAR | Status: AC
  Administered 2021-03-10: 1 mg via INTRAVENOUS
  Filled 2021-03-10: qty 1

## 2021-03-10 MED ORDER — METOCLOPRAMIDE HCL 5 MG/ML IJ SOLN
10.0000 mg | Freq: Once | INTRAMUSCULAR | Status: AC
  Administered 2021-03-10: 10 mg via INTRAVENOUS
  Filled 2021-03-10: qty 2

## 2021-03-10 MED ORDER — SODIUM CHLORIDE 0.9 % IV BOLUS
500.0000 mL | Freq: Once | INTRAVENOUS | Status: AC
  Administered 2021-03-10: 500 mL via INTRAVENOUS

## 2021-03-10 MED ORDER — CALCIUM GLUCONATE-NACL 1-0.675 GM/50ML-% IV SOLN: 1.0000 g | Freq: Once | INTRAVENOUS | Status: DC

## 2021-03-10 MED ORDER — DIPHENHYDRAMINE HCL 50 MG/ML IJ SOLN
12.5000 mg | Freq: Once | INTRAMUSCULAR | Status: DC
  Filled 2021-03-10: qty 1

## 2021-03-10 MED ORDER — FENTANYL CITRATE PF 50 MCG/ML IJ SOSY
50.0000 ug | PREFILLED_SYRINGE | INTRAMUSCULAR | Status: AC | PRN
  Administered 2021-03-10 (×2): 50 ug via INTRAVENOUS
  Filled 2021-03-10 (×2): qty 1

## 2021-03-10 NOTE — Consult Note (Signed)
Neurology Consultation  Reason for Consult: Code stroke for aphasia, generalized weakness Referring Physician: Dr. Pattricia Boss  CC: Aphasia, generalized weakness worse on the right  History is obtained from: Chart, patient  HPI: Desiree Russell is a 50 y.o. female headaches, fibromyalgia, seizure versus PNES,Recent admission in June with similar symptoms-with incidental right parietal punctate infarct without any correlation to her symptoms, presenting for sudden onset of word finding difficulty and weakness that started at work. 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  She was at work and had a sudden onset of generalized weakness which started after her headache worsen. She had a headache this morning at 6 AM that she woke up with.  Went to work, was transporting patients and all of a sudden started feeling unwell and leaned on a counter and her coworkers noticed that she was weak all over and fell in the chair.  She was having difficulty getting words out.  EMS was called.  Code stroke was activated and brought to the ER.  On review of systems, she also complained of sharp back pain and bilateral leg weakness.   During her last admission, stroke work-up was completed.  Started on dual antiplatelets for 3 weeks followed by Plavix only.  Stroke deemed to be of small vessel etiology after work-up. Also of note, she was hooked up to LTM EEG because she started having hallucinations when the primary hospitalist saw her concerning for seizures.  She had another episode where she was found down in the bathroom but was able to answer questions.  Both these episodes were captured on LTM EEG without any correlate-highly suspicious for PNES.    LKW: 8:45 AM tpa given?: no, functional exam Premorbid modified Rankin scale (mRS): 0  ROS: Full ROS was performed and is negative except as noted in the HPI.   Past Medical History:  Diagnosis Date   Anxiety    Dyslipidemia     Fibromyalgia    Headache    Pericarditis    Seizures (HCC)    Tachycardia    TIA (transient ischemic attack)    Recurrent   Family History  Problem Relation Age of Onset   Hypertension Mother    Hypertension Father    Heart attack Father 21   Healthy Sister    Diabetes type II Brother    Hypertension Maternal Grandmother    Diabetes Paternal Grandmother    Healthy Sister    Healthy Sister    COPD Brother    Healthy Brother    Healthy Brother      Social History:   reports that she has never smoked. She has never used smokeless tobacco. She reports current alcohol use. She reports that she does not use drugs.  Medications No current facility-administered medications for this encounter.  Current Outpatient Medications:    albuterol (VENTOLIN HFA) 108 (90 Base) MCG/ACT inhaler, INHALE 2 PUFFS INTO THE LUNGS EVERY 4 HOURS AS NEEDED FOR WHEEZING., Disp: 8.5 g, Rfl: 0   ALPRAZolam (XANAX) 0.5 MG tablet, Take 1 tablet (0.5 mg total) by mouth 2 (two) times daily as needed for anxiety., Disp: 45 tablet, Rfl: 1   aspirin 81 MG EC tablet, Take 1 tablet (81 mg total) by mouth daily., Disp: 21 tablet, Rfl: 0   atorvastatin (LIPITOR) 40 MG tablet, Take 1 tablet (40 mg total) by mouth daily., Disp: 30 tablet, Rfl: 1   B Complex Vitamins (VITAMIN B COMPLEX PO), Take 1 capsule by  mouth daily., Disp: , Rfl:    cetirizine (ZYRTEC) 10 MG tablet, Take 10 mg by mouth daily., Disp: , Rfl:    Cholecalciferol (VITAMIN D3) 1000 units CAPS, Take 1 tablet by mouth daily., Disp: , Rfl:    clopidogrel (PLAVIX) 75 MG tablet, Take 1 tablet (75 mg total) by mouth daily., Disp: 30 tablet, Rfl: 1   Cranberry-Vitamin C-Probiotic (AZO CRANBERRY PO), Take 2 capsules by mouth as needed (UTI)., Disp: , Rfl:    diclofenac sodium (VOLTAREN) 1 % GEL, Apply 2 g topically 2 (two) times daily as needed (apply to chest)., Disp: , Rfl:    DULoxetine (CYMBALTA) 60 MG capsule, Take 1 capsule (60 mg total) by mouth  daily., Disp: 30 capsule, Rfl: 1   esomeprazole (NEXIUM) 20 MG packet, Take 20 mg by mouth daily before breakfast., Disp: , Rfl:    gabapentin (NEURONTIN) 300 MG capsule, Take 1 capsule (300 mg total) by mouth at bedtime., Disp: 90 capsule, Rfl: 2   Galcanezumab-gnlm (EMGALITY) 120 MG/ML SOAJ, Inject 120 mg into the skin every 30 (thirty) days., Disp: , Rfl:    Galcanezumab-gnlm (EMGALITY) 120 MG/ML SOSY, Inject 120 mg under the skin every 28 days, Disp: 1 mL, Rfl: 6   magnesium citrate SOLN, Take 1 Bottle by mouth once., Disp: , Rfl:    methocarbamol (ROBAXIN) 500 MG tablet, Take 1/2 to 1 tablet by mouth nightly at bedtime as needed, Disp: 30 tablet, Rfl: 5   metoprolol succinate (TOPROL-XL) 50 MG 24 hr tablet, Take 1 tablet (50 mg total) by mouth daily., Disp: 90 tablet, Rfl: 3   OLANZapine-FLUoxetine (SYMBYAX) 6-25 MG capsule, Take 1 capsule by mouth at bedtime., Disp: 30 capsule, Rfl: 1   sennosides-docusate sodium (SENOKOT-S) 8.6-50 MG tablet, Take 4 tablets by mouth at bedtime., Disp: , Rfl:    Ubrogepant (UBRELVY) 100 MG TABS, Take 100 mg by mouth as needed (migraine)., Disp: , Rfl:    Ubrogepant (UBRELVY) 100 MG TABS, TAKE  1/2 TABLET AT HEADACHE ONSET. MAY REPEAT DOSE IN TWO HOURS IF NO IMPROVEMENT. DO NOT EXCEED MORE THAN TWO TABLETS IN 24 HOURS., Disp: 18 tablet, Rfl: 1   valACYclovir (VALTREX) 500 MG tablet, Take 1 tablet (500 mg total) by mouth daily., Disp: 90 tablet, Rfl: 1  Exam: Current vital signs: BP (!) 118/51 (BP Location: Right Arm)   Pulse 82   Temp 98.2 F (36.8 C) (Oral)   Resp 17   Wt 100.6 kg   SpO2 100%   BMI 38.07 kg/m  Vital signs in last 24 hours: Temp:  [98.2 F (36.8 C)] 98.2 F (36.8 C) (08/12 1028) Pulse Rate:  [82] 82 (08/12 1028) Resp:  [17] 17 (08/12 1028) BP: (118)/(51-72) 118/51 (08/12 1028) SpO2:  [100 %] 100 % (08/12 1028) Weight:  [100.6 kg] 100.6 kg (08/12 1000)  General: Awake, alert, anxious appearing HEENT:  Normocephalic/atraumatic CVS: Regular rate rhythm Respiratory: Chest clear Abdomen nondistended nontender Extremities warm well perfused Neurologic exam Awake alert oriented to self and the fact that she is in the hospital Initially not vocalizing but is mouthing words and moving her lips to make sure that we can read her lips. I do not suspect she has aphasia as this appears very volitional. Intermittent and none neurological looking stutter whenever she tries to vocalize-no frank dysarthria Cranial nerves: Pupils equal round react light, extraocular movements intact, visual fields full, facial sensation is diminished on the right side with splitting of vibratory sensation over the right forehead, face  symmetric, tongue and palate midline. Motor exam with inability to move bilateral upper extremities and mild drift in the right upper extremity and no drift in the left upper extremity. Sensory exam: Diminished on the right hemibody with a sharp cut off in the midline Coordination: There is no dysmetria on the weaker right side but she always tries to touch the middle of my finger rather than the tip of my finger in spite of being told to touch the tip of my finger and moving my finger-again appearing volitionally missing touching the tip of the finger, no dysmetria on the other side Gait testing deferred  1a Level of Conscious.: 0 1b LOC Questions: 0 1c LOC Commands: 0 2 Best Gaze: 0 3 Visual: 0 4 Facial Palsy: 0 5a Motor Arm - left: 0 5b Motor Arm - Right: 1 6a Motor Leg - Left: 3 6b Motor Leg - Right: 3 7 Limb Ataxia: 1 8 Sensory: 2 9 Best Language: 0 10 Dysarthria: 1 11 Extinct. and Inatten.: 0 TOTAL: 11   Labs I have reviewed labs in epic and the results pertinent to this consultation are:  CBC    Component Value Date/Time   WBC 6.1 03/10/2021 0957   RBC 4.15 03/10/2021 0957   HGB 13.6 03/10/2021 1007   HGB 12.7 10/27/2020 0934   HCT 40.0 03/10/2021 1007   HCT 39.0  10/27/2020 0934   PLT 271 03/10/2021 0957   PLT 279 10/27/2020 0934   MCV 91.3 03/10/2021 0957   MCV 91 10/27/2020 0934   MCH 30.6 03/10/2021 0957   MCHC 33.5 03/10/2021 0957   RDW 13.9 03/10/2021 0957   RDW 13.4 10/27/2020 0934   LYMPHSABS 2.8 03/10/2021 0957   LYMPHSABS 1.6 10/27/2020 0934   MONOABS 0.6 03/10/2021 0957   EOSABS 0.1 03/10/2021 0957   EOSABS 0.0 10/27/2020 0934   BASOSABS 0.0 03/10/2021 0957   BASOSABS 0.0 10/27/2020 0934    CMP     Component Value Date/Time   NA 135 03/10/2021 1007   K 6.4 (HH) 03/10/2021 1007   CL 106 03/10/2021 1007   CO2 23 01/21/2021 0156   GLUCOSE 92 03/10/2021 1007   BUN 13 03/10/2021 1007   CREATININE 0.90 03/10/2021 1007   CALCIUM 8.3 (L) 01/21/2021 0156   PROT 6.5 01/20/2021 0939   ALBUMIN 3.5 01/20/2021 0939   AST 19 01/20/2021 0939   ALT 15 01/20/2021 0939   ALKPHOS 39 01/20/2021 0939   BILITOT 0.4 01/20/2021 0939   GFRNONAA >60 01/21/2021 0156   GFRAA >60 12/12/2014 1747     Imaging I have reviewed the images obtained:  CT-head today with aspects 10.  No acute changes.  No bleed.  MRI examination of the brain June 2022-punctate right parietal area of subacute infarction.  Assessment:  50 year old presenting with generalized weakness worse on right than left, and a speech pattern that is very functional-sensory exam also inconsistent -making me suspect most likely complex migraine versus conversion. Given a history of prior punctate infarct-an MRI is in order. Given sudden onset of leg weakness, although appears completely effort dependent, would rule out cauda equina.  Recommendations: MRI brain without contrast MRI L-spine without contrast-low suspicion but rule out cauda equina Migraine cocktail Outpatient neurology follow-up. Outpatient psychiatry follow-up.  -- Amie Portland, MD Neurologist [facility] Pager: [phone]   Addendum MRI brain-no acute stroke MRI L-spine-multilevel DJD but  no evidence of cauda equina syndrome. Outpatient follow-up with neurology. No further inpatient work-up Discussed with W. Lavone Orn,  APP in the ER  -- Amie Portland, MD Neurologist [facility] Pager: [phone]

## 2021-03-10 NOTE — ED Triage Notes (Signed)
Pt BIB [facility] from work c/o a code stroke. Pt's LKW was 845 am. 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 Coworker helped pt into a chair and pt began experience generalized weakness and aphasia.

## 2021-03-10 NOTE — Discharge Instructions (Addendum)
Your MRI of the brain was without any changes from prior MRIs.  I have printed your lumbar spine MRI he did have a small mildly protruding disc at this be followed up by your primary care provider and I have also given you the information for a spine surgeon he may follow-up with for consultation and evaluation of this.  Please take Tylenol 1000 mg every 6 hours for pain.  Please apply Voltaren gel which is a anti-inflammatory medicine.  You may always return to the ER for any new or concerning symptoms however neck step should be following up with your primary care provider, neurologist, a psychiatrist and a spine doctor.

## 2021-03-10 NOTE — ED Notes (Signed)
Pure wick suction is set at 80.

## 2021-03-10 NOTE — Code Documentation (Signed)
Stroke Response Nurse Documentation Code Documentation  Thora Aslam is a 50 y.o. female arriving to [place] ED via [facility] on 03/10/21 with past medical hx of TIA, dyslipidemia, seizures. Code stroke was activated by EMS. Patient from work where she was LKW at [phone] and now complaining of aphasia, right sided weakness .   Stroke team at the bedside on patient arrival. Labs drawn and patient cleared for CT by EDP Ray. Patient to CT with team. NIHSS 9, see documentation for details and code stroke times. Patient with disoriented, right arm weakness, bilateral leg weakness, and Expressive aphasia  on exam. The following imaging was completed: CT. Patient is not a candidate for tPA due to contraindication (stroke within three months).  Care/Plan: q2 hour mNIHSS, MRI. Bedside handoff with ED RN Velna Hatchet.    Candace Cruise K  Stroke Response RN

## 2021-03-10 NOTE — ED Notes (Signed)
CMET hemolysis. Will redraw.

## 2021-03-10 NOTE — ED Provider Notes (Signed)
[facility] [facility] EMERGENCY DEPARTMENT Provider Note   CSN: [id number] Arrival date & time: 03/10/21  [id number]  An emergency department physician performed an initial assessment on this suspected stroke patient at 0956.  History Chief Complaint  Patient presents with   Code Stroke    Saylor Soliz is a 50 y.o. female.  HPI Patient is a 50 year old female with past medical history significant for multiple questionably asymptomatic CVA, TIAs, fibromyalgia, seizures, anxiety, HLD  Patient is presented to the ER today after near syncopal episode.  Last known normal was 845/9 AM this morning.  Per EMS patient presented to the ER today with lightheadedness, difficulty with word finding, weakness, headache this is a started around 6 AM this morning as she woke up with the headache.  It was later around 9 AM this morning when she got to work and started to feel nauseous and lightheaded.  She is having difficulty finding words and getting words out speaking clearly.  She is also complaining of severe low back pain that is been ongoing for approximately 1 week she states.   History without symptoms of urinary or stool retention or incontinence, neurologic changes such as sensation change or weakness lower extremities, coagulopathy or blood thinner use, is not elderly or with history of osteoporosis, denies any history of cancer, fever, IV drug use, weight changes (unexplained), or prolonged steroid use.       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/22/2021   CVA (cerebral vascular accident) ([place]) 01/20/2021   Compulsive skin picking 10/24/2020   Brain fog 10/24/2020   History of COVID-19 10/24/2020    Cough 10/24/2020   Right sided weakness 03/14/2018   Chronic migraine 03/14/2018   Loss of consciousness ([place]) 03/14/2018   Chronic adhesive idiopathic pericarditis 10/15/2017   Panic attacks 10/05/2017   Paroxysmal SVT (supraventricular tachycardia) ([place]) 10/05/2017   Other forms of dyspnea 10/03/2017   Sinus tachycardia 10/03/2017   Episode of recurrent major depressive disorder ([place]) 09/27/2017   Thrombocytopenia ([place]) 09/13/2017   Normocytic normochromic anemia 09/13/2017   Pericardial effusion 09/12/2017   Atypical chest pain 06/18/2016   Palpitations 06/18/2016   Depression 06/13/2016   Pseudoseizures ([place]) 06/08/2016   Seizure ([place]) 06/08/2016   Somatization disorder 06/08/2016   Anxiety 12/28/2015   TIA (transient ischemic attack) 12/28/2015   Tremor 12/28/2015   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     Family History  Problem Relation Age of Onset   Hypertension Mother    Hypertension Father    Heart attack Father 17   Healthy Sister    Diabetes type II Brother    Hypertension Maternal Grandmother    Diabetes Paternal Grandmother    Healthy Sister    Healthy Sister    COPD Brother    Healthy Brother    Healthy Brother     Social History  Tobacco Use   Smoking status: Never   Smokeless tobacco: Never  Substance Use Topics   Alcohol use: Yes    Comment: occ    Drug use: No    Home Medications Prior to Admission medications   Medication Sig Start Date End Date Taking? Authorizing Provider  acetaminophen (TYLENOL) 325 MG tablet Take 650 mg by mouth every 6 (six) hours as needed for mild pain, fever or headache.   Yes [provider]  ALPRAZolam (XANAX) 0.5 MG tablet Take 1 tablet (0.5 mg  total) by mouth 2 (two) times daily as needed for anxiety. Patient taking differently: Take 0.5 mg by mouth See admin instructions. 0.'5mg'$  oral at bedtime And 0.'5mg'$  daily as needed for anxiety 02/14/21 02/14/22 Yes Arfeen, Arlyce Harman, MD  aspirin 81 MG EC tablet Take 1 tablet (81 mg total) by mouth daily. 01/23/21  Yes Mikhail, Velta Addison, DO  atorvastatin (LIPITOR) 40 MG tablet Take 1 tablet (40 mg total) by mouth daily. 01/24/21  Yes Mikhail, Velta Addison, DO  Calcium-Vitamin D-Vitamin K 650-12.5-40 MG-MCG-MCG CHEW Chew 1 tablet by mouth daily.   Yes [provider]  clopidogrel (PLAVIX) 75 MG tablet Take 1 tablet (75 mg total) by mouth daily. 01/24/21  Yes Mikhail, Velta Addison, DO  cyclobenzaprine (FLEXERIL) 10 MG tablet Take 5-10 mg by mouth at bedtime as needed for muscle spasms. 02/28/21  Yes [provider]  DULoxetine (CYMBALTA) 60 MG capsule Take 1 capsule (60 mg total) by mouth daily. 02/14/21 03/17/21 Yes Arfeen, Arlyce Harman, MD  esomeprazole (NEXIUM) 20 MG packet Take 20 mg by mouth daily before breakfast.   Yes [provider]  gabapentin (NEURONTIN) 300 MG capsule Take 1 capsule (300 mg total) by mouth at bedtime. 02/22/21 02/22/22 Yes King, Diona Foley, NP  Galcanezumab-gnlm [facility]) 120 MG/ML SOSY Inject 120 mg under the skin every 28 days 02/15/21  Yes   loratadine (CLARITIN) 10 MG tablet Take 10 mg by mouth daily.   Yes [provider]  methocarbamol (ROBAXIN) 500 MG tablet Take 1/2 to 1 tablet by mouth nightly at bedtime as needed 02/24/21  Yes   metoprolol succinate (TOPROL-XL) 50 MG 24 hr tablet Take 1 tablet (50 mg total) by mouth daily. 01/11/21 01/11/22 Yes Minus Breeding, MD  Multiple Vitamin (MULTIVITAMIN WITH MINERALS) TABS tablet Take 1 tablet by mouth daily.   Yes [provider]  OLANZapine-FLUoxetine (SYMBYAX) 6-25 MG capsule Take 1 capsule by mouth at bedtime. 02/14/21  Yes Arfeen, Arlyce Harman, MD  Ubrogepant (UBRELVY) 100 MG TABS TAKE  1/2 TABLET AT HEADACHE  ONSET. MAY REPEAT DOSE IN TWO HOURS IF NO IMPROVEMENT. DO NOT EXCEED MORE THAN TWO TABLETS IN 24 HOURS. Patient taking differently: Take 50-100 mg by mouth See admin instructions. Take '50mg'$  at headache onset, may repeat dose in two hours if no improvement. Do not excess more than '200mg'$  in 24 hours 02/15/21  Yes   valACYclovir (VALTREX) 500 MG tablet Take 1 tablet (500 mg total) by mouth daily. 08/02/20  Yes   albuterol (VENTOLIN HFA) 108 (90 Base) MCG/ACT inhaler INHALE 2 PUFFS INTO THE LUNGS EVERY 4 HOURS AS NEEDED FOR WHEEZING. Patient taking differently: Inhale 2 puffs into the lungs every 4 (four) hours as needed for wheezing. 10/20/20 10/20/21  Fenton Foy, NP    Allergies    Codeine, Morphine, Morphine and related, Oxycodone, and Chlorhexidine  Review of Systems   Review of Systems  Constitutional:  Positive for fatigue. Negative for chills and fever.  HENT:  Negative for congestion.   Eyes:  Negative for pain.  Respiratory:  Negative for cough and shortness of breath.   Cardiovascular:  Negative for chest pain and leg swelling.  Gastrointestinal:  Negative for abdominal pain and vomiting.  Genitourinary:  Negative for dysuria.  Musculoskeletal:  Positive for back pain. Negative for myalgias.  Skin:  Negative for rash.  Neurological:  Positive for weakness and light-headedness. Negative for dizziness and headaches.   Physical Exam Updated Vital Signs BP 126/74   Pulse 77   Temp 98.2 F (36.8 C) (Oral)   Resp 15   Wt 100.6 kg   SpO2 100%   BMI 38.07 kg/m   Physical Exam Vitals and nursing note reviewed.  Constitutional:      General: She is not in acute distress.    Comments: Moves all 4 extremities.  Speaking and breathing spontaneously.  HENT:     Head: Normocephalic and atraumatic.     Nose: Nose normal.     Mouth/Throat:     Mouth: Mucous membranes are moist.  Eyes:     Comments: Opening closing eyes voluntarily  Cardiovascular:     Rate and Rhythm: Normal  rate and regular rhythm.     Pulses: Normal pulses.     Heart sounds: Normal heart sounds.  Pulmonary:     Effort: Pulmonary effort is normal. No respiratory distress.     Breath sounds: No wheezing.  Abdominal:     Palpations: Abdomen is soft.     Tenderness: There is no abdominal tenderness. There is no guarding or rebound.  Musculoskeletal:     Cervical back: Normal range of motion.     Right lower leg: No edema.     Left lower leg: No edema.     Comments: Midline tenderness palpation of the lumbar spine no warmth redness step-off or deformity.  Skin:    General: Skin is warm and dry.     Capillary Refill: Capillary refill takes less than 2 seconds.  Neurological:     Mental Status: She is alert.     Comments: Diffusely poor effort --initial neurologic exam deferred to Dr. Malen Gauze of neurology patient assessed.   Psychiatric:        Mood and Affect: Mood normal.        Behavior: Behavior normal.    ED Results / Procedures / Treatments   Labs (all labs ordered are listed, but only abnormal results are displayed) Labs Reviewed  RAPID URINE DRUG SCREEN, HOSP PERFORMED - Abnormal; Notable for the following components:      Result Value   Benzodiazepines POSITIVE (*)    All other components within normal limits  URINALYSIS, ROUTINE W REFLEX MICROSCOPIC - Abnormal; Notable for the following components:   APPearance HAZY (*)    All other components within normal limits  I-STAT CHEM 8, ED - Abnormal; Notable for the following components:   Potassium 6.4 (*)    Calcium, Ion 1.11 (*)    All other components within normal limits  RESP PANEL BY RT-PCR (FLU A&B, COVID) ARPGX2  ETHANOL  CBC  DIFFERENTIAL  PROTIME-INR  BASIC METABOLIC PANEL  HEPATIC FUNCTION PANEL  I-STAT BETA HCG BLOOD, ED ([facility], [facility], [facility] ONLY)  CBG MONITORING, ED    EKG EKG Interpretation  Date/Time:  Friday March 10 2021 10:16:41 EDT Ventricular Rate:  75 PR Interval:  137 QRS Duration: 77 QT  Interval:  372 QTC Calculation: 416 R Axis:   40 Text Interpretation: Sinus rhythm  Abnormal R-wave progression, early transition No significant change since last tracing Confirmed by Pattricia Boss [phone]) on 03/10/2021 10:59:13 AM  Radiology MR BRAIN WO CONTRAST  Result Date: 03/10/2021 CLINICAL DATA:  Neuro deficit, acute, stroke suspected. Aphasia, right arm weakness, and bilateral leg weakness. History of stroke. EXAM: MRI HEAD WITHOUT CONTRAST TECHNIQUE: Multiplanar, multiecho pulse sequences of the brain and surrounding structures were obtained without intravenous contrast. COMPARISON:  Head CT 03/10/2021 and MRI 01/20/2021 FINDINGS: Brain: There is no evidence of an acute infarct, intracranial hemorrhage, mass, midline shift, or extra-axial fluid collection. Small chronic infarcts in the left greater than right cerebellar hemispheres are unchanged from the prior MRI, as is a small chronic right frontal cortical infarct. There is mild cerebral atrophy. Minimal scattered punctate T2 hyperintensities in the cerebral white matter bilaterally are unchanged and nonspecific though may reflect early chronic small vessel ischemia or migraines. Vascular: Major intracranial vascular flow voids are preserved. Skull and upper cervical spine: Unremarkable bone marrow signal. Sinuses/Orbits: Unremarkable orbits. Paranasal sinuses and mastoid air cells are clear. Other: None. IMPRESSION: 1. No acute intracranial abnormality. 2. Chronic cerebellar and right frontal lobe infarcts. Electronically Signed   By: Logan Bores M.D.   On: 03/10/2021 12:46   MR LUMBAR SPINE WO CONTRAST  Result Date: 03/10/2021 CLINICAL DATA:  Low back pain, cauda equina syndrome suspected. Bilateral leg weakness. EXAM: MRI LUMBAR SPINE WITHOUT CONTRAST TECHNIQUE: Multiplanar, multisequence MR imaging of the lumbar spine was performed. No intravenous contrast was administered. COMPARISON:  10/11/2019 FINDINGS: Segmentation:  Standard.  Alignment:  Normal. Vertebrae: No fracture, suspicious marrow lesion, or significant marrow edema. Conus medullaris and cauda equina: Conus extends to the L1-2 level. Conus and cauda equina appear normal. Paraspinal and other soft tissues: Unremarkable. Disc levels: The lumbar spinal canal is capacious. Disc height and hydration are maintained from T11-12 to L4-5. At L5-S1, there is disc desiccation, minimal disc bulging, a shallow right central disc protrusion with annular fissure without associated stenosis. Lower lumbar facet arthrosis is again noted, moderate to severe on the right at L4-5 and moderate bilaterally at L5-S1. Right-sided facet arthrosis at L4-5 has likely mildly progressed, and there is a joint effusion which is larger than on the prior study. IMPRESSION: 1. Unchanged minimal disc bulging and shallow right central disc protrusion at L5-S1 without stenosis. 2. Lower lumbar facet arthrosis, most severe on the right at L4-5. Electronically Signed   By: Logan Bores M.D.   On: 03/10/2021 13:03   CT HEAD CODE STROKE WO CONTRAST  Result Date: 03/10/2021 CLINICAL DATA:  Code stroke. 50 year old female with acute onset weakness and aphasia. EXAM: CT HEAD WITHOUT CONTRAST TECHNIQUE: Contiguous axial images were obtained from the base of the skull through the vertex without intravenous contrast. COMPARISON:  Brain MRI 01/20/2021.  Head CT 02/13/2021. FINDINGS: Brain: No midline shift, ventriculomegaly, mass effect, evidence of mass lesion, intracranial hemorrhage or evidence of cortically based acute infarction. Gray-white matter differentiation appears stable and within normal limits. No cortical encephalomalacia identified, but multiple small chronic infarcts in the left cerebellum were demonstrated by MRI in June. Vascular: No suspicious intracranial vascular hyperdensity. Skull: Negative skull. Congenital incomplete ossification of the posterior C1 ring. Sinuses/Orbits: Visualized paranasal sinuses  and mastoids are stable and well aerated. Other: Stable mildly Disconjugate gaze. Visualized scalp soft tissues are within normal limits. ASPECTS [facility] Stroke Program Early CT Score) Total score (0-10 with 10 being normal): 10 IMPRESSION: 1. No acute cortically based infarct or acute intracranial hemorrhage identified.  ASPECTS 10. 2. Multiple small chronic left cerebellar infarcts better demonstrated by MRI. 3. These results were communicated to Dr. Rory Percy at 10:10 am on 03/10/2021 by text page via the [facility] messaging system. Electronically Signed   By: Genevie Ann M.D.   On: 03/10/2021 10:10    Procedures Procedures   Medications Ordered in ED Medications  diphenhydrAMINE (BENADRYL) injection 12.5 mg (12.5 mg Intravenous Not Given 03/10/21 1332)  metoCLOPramide (REGLAN) injection 10 mg (10 mg Intravenous Given 03/10/21 1050)  sodium chloride 0.9 % bolus 500 mL (0 mLs Intravenous Stopped 03/10/21 1323)  fentaNYL (SUBLIMAZE) injection 50 mcg (50 mcg Intravenous Given 03/10/21 1333)  LORazepam (ATIVAN) injection 1 mg (1 mg Intravenous Given by Other 03/10/21 1120)    ED Course  I have reviewed the triage vital signs and the nursing notes.  Pertinent labs & imaging results that were available during my care of the patient were reviewed by me and considered in my medical decision making (see chart for details).  Clinical Course as of 03/10/21 1635  Fri Mar 10, 2021  1044 Potassium(!!): 6.4 I was informed that potassium is elevated but patient's blood was hemolyzed.  We will recheck before treatment.  There is no EKG changes notable concerning for hyperkalemia. [WF]    Clinical Course User Index [WF] Tedd Sias, [place]   MDM Rules/Calculators/A&P                            Patient was last evaluated by neurology 02/02/2021 at [facility] neurologist Dr. Cornelia Copa.  At that time was continued on chronic headache medications gets Emgality monthly and Roselyn Meier as needed for headaches. Patient has ongoing  expressive aphasia, weakness, difficulty walking. Seems that she was found to have a stroke that did not correlate with her symptoms but has been treated medically for stroke given the presence of stroke on MRI and history of TIAs.  I reviewed patient's head imaging from 01/20/2021 -At that time patient had probable/questionable subacute infarct in the right parietal lobe.  Also some remote lacunar cerebellar infarcts small remote right frontal cortical infarct.  CT angiography showed some perhaps anatomical variants for cerebral artery formations.  But no large vessel occlusion.  She has been referred to psychiatry notably before.  MRI without evidence of cauda equina.  Work-up here in the ER unremarkable apart from factitious hyperkalemia on i-STAT Chem-8 due to hemolysis BMP confirms normal potassium.  Discussed with Dr. Rory Percy  of neurology prior to discharge who is agreeable with plan to discharge from a neurology standpoint.  For standpoint of the ER patient has had reassuring vital signs, neurologic exam has returned to normal.  Work-up has been reassuring MRI brain without evidence of acute infarction.  Will discharge home.  She is understanding of plan.  Headache is completely resolved with headache cocktail.  Return precautions given.  Final Clinical Impression(s) / ED Diagnoses Final diagnoses:  Weakness  Acute nonintractable headache, unspecified headache type  Lumbosacral disc herniation  Midline low back pain without sciatica, unspecified chronicity    Rx / DC Orders ED Discharge Orders     None        Tedd Sias, [place] 03/10/21 1637    Pattricia Boss, MD 03/13/21 [phone]

## 2021-03-10 NOTE — ED Notes (Signed)
Patient transported to MRI 

## 2021-03-10 NOTE — ED Notes (Signed)
Pt returned from MRI °

## 2021-03-13 ENCOUNTER — Other Ambulatory Visit (HOSPITAL_COMMUNITY): Payer: Self-pay

## 2021-03-13 LAB — I-STAT CHEM 8, ED
BUN: 13 mg/dL (ref 6–20)
Calcium, Ion: 1.11 mmol/L — ABNORMAL LOW (ref 1.15–1.40)
Chloride: 106 mmol/L (ref 98–111)
Creatinine, Ser: 0.9 mg/dL (ref 0.44–1.00)
Glucose, Bld: 92 mg/dL (ref 70–99)
HCT: 40 % (ref 36.0–46.0)
Hemoglobin: 13.6 g/dL (ref 12.0–15.0)
Potassium: 6.4 mmol/L (ref 3.5–5.1)
Sodium: 135 mmol/L (ref 135–145)
TCO2: 27 mmol/L (ref 22–32)

## 2021-03-15 ENCOUNTER — Ambulatory Visit: Payer: 59 | Attending: Family Medicine

## 2021-03-15 ENCOUNTER — Other Ambulatory Visit: Payer: Self-pay

## 2021-03-15 DIAGNOSIS — R252 Cramp and spasm: Secondary | ICD-10-CM | POA: Diagnosis not present

## 2021-03-15 DIAGNOSIS — R2681 Unsteadiness on feet: Secondary | ICD-10-CM | POA: Insufficient documentation

## 2021-03-15 DIAGNOSIS — G8929 Other chronic pain: Secondary | ICD-10-CM | POA: Insufficient documentation

## 2021-03-15 DIAGNOSIS — M5440 Lumbago with sciatica, unspecified side: Secondary | ICD-10-CM | POA: Insufficient documentation

## 2021-03-15 DIAGNOSIS — M6281 Muscle weakness (generalized): Secondary | ICD-10-CM | POA: Insufficient documentation

## 2021-03-16 ENCOUNTER — Ambulatory Visit (INDEPENDENT_AMBULATORY_CARE_PROVIDER_SITE_OTHER): Payer: 59 | Admitting: Clinical

## 2021-03-16 DIAGNOSIS — R441 Visual hallucinations: Secondary | ICD-10-CM | POA: Diagnosis not present

## 2021-03-16 DIAGNOSIS — F331 Major depressive disorder, recurrent, moderate: Secondary | ICD-10-CM | POA: Diagnosis not present

## 2021-03-16 DIAGNOSIS — F4310 Post-traumatic stress disorder, unspecified: Secondary | ICD-10-CM | POA: Diagnosis not present

## 2021-03-16 DIAGNOSIS — R440 Auditory hallucinations: Secondary | ICD-10-CM

## 2021-03-16 DIAGNOSIS — F419 Anxiety disorder, unspecified: Secondary | ICD-10-CM | POA: Diagnosis not present

## 2021-03-16 NOTE — Patient Instructions (Signed)
Access Code: [email or web address] URL: [email or web address] Date: 03/16/2021 Prepared by: Sherlynn Stalls  Exercises Supine Lower Trunk Rotation - 1 x daily - 7 x weekly - 1 sets - 10 reps Seated Long Arc Quad - 1 x daily - 7 x weekly - 2 sets - 10 reps Seated March - 1 x daily - 7 x weekly - 2 sets - 10 reps Seated Pelvic Tilt - 1 x daily - 7 x weekly - 2 sets - 10 reps

## 2021-03-16 NOTE — Therapy (Signed)
[place]. [place], [place], [address] Phone: [phone]   Fax:  [phone]  Physical Therapy Evaluation  Patient Details  Name: Desiree Russell MRN: [id number] Date of Birth: [birth year] Referring Provider (PT): Meeler, Hoehne FNP - physiatry   Encounter Date: 03/15/2021   PT End of Session - 03/15/21 1814     Visit Number 1    Date for PT Re-Evaluation 05/10/21    PT Start Time [id number]    PT Stop Time 1815    PT Time Calculation (min) 45 min    Activity Tolerance Patient tolerated treatment well;Patient limited by fatigue;Patient limited by pain    Behavior During Therapy [facility] for tasks assessed/performed             Past Medical History:  Diagnosis Date   Anxiety    Dyslipidemia    Fibromyalgia    Headache    Pericarditis    Seizures (HCC)    Tachycardia    TIA (transient ischemic attack)    Recurrent    Past Surgical History:  Procedure Laterality Date   ABDOMINAL HYSTERECTOMY     CHOLECYSTECTOMY, LAPAROSCOPIC      There were no vitals filed for this visit.    Subjective Assessment - 03/15/21 1734     Subjective Pain has been going on for a while but recently worse. Hurts when straightening back, mopping/twisting. Pain goes all the way down back of calves, R>L. Reports over the last week or so it takes longer to start stream of urine but reports MD aware. recent ED visit - was at work and felt legs were heavy and couldnt move (right side worse than left). 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 Also reports very frequent falls    Patient is accompained by: Family member   husband   Pertinent History 2019 fall down stairs with injury to B ankles. Hx of stroke june 2022 and several TIAs before that with residual weakness    Diagnostic tests Lumbar MRI 03/10/21 IMPRESSION:  1. Unchanged minimal disc bulging and shallow right central disc  protrusion at L5-S1 without stenosis.  2. Lower lumbar  facet arthrosis, most severe on the right at L4-5.    Patient Stated Goals to decrease pain, improve strength/flexibility, be able to go up and down stairs, be able to walk more easily and less pain    Currently in Pain? Yes    Pain Score 6     Pain Location Back    Pain Orientation Right;Left;Lower    Pain Descriptors / Indicators Aching    Pain Type Acute pain;Chronic pain    Pain Radiating Towards down calves R worse than left, does report N/T down to both feet    Pain Onset More than a month ago    Pain Frequency Constant    Aggravating Factors  standing, twisting    Pain Relieving Factors voltaren gel, tylenol, ms relaxer/flexiril (doesnt really stop pain)                [facility] PT Assessment - 03/15/21 1733       Assessment   Medical Diagnosis M54.16 (ICD-10-CM) - Radiculopathy, lumbar region  M51.36 (ICD-10-CM) - Other intervertebral disc degeneration, lumbar region    Referring Provider (PT) Meeler, Whitney FNP - neurology    Hand Dominance Right    Next MD Visit going for epidural on friday      Balance Screen   Has the patient fallen  in the past 6 months Yes    Has the patient had a decrease in activity level because of a fear of falling?  Yes    Is the patient reluctant to leave their home because of a fear of falling?  No      Home Environment   Additional Comments house, 2 flights of stairs inside, 14 steps with unilateral railing. Lives with husband, has a daughter who is amarried who moved out recently.      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 Currently unable to sit or stand for any longer than 5 minutes at a time befor epain      Cognition   Overall Cognitive Status Within Functional Limits for tasks assessed      Observation/Other Assessments   Focus on Therapeutic Outcomes (FOTO)  34%      ROM / Strength   AROM / PROM / Strength AROM;Strength      AROM   Overall AROM  Comments flexion 75% with pain, extenion50% relieves pain. lateral flexion to the left full no pain, to the right symmetrical but increases pain. rotaiton 50% B with some end range tightness.      Strength   Overall Strength Comments RLE grossly 3+/5 limited by pain, LLE grossly 4-/5 limited by pain but less so compared to right side.      Flexibility   Soft Tissue Assessment /Muscle Length yes    Hamstrings significant tightness and posistive SLR worse on right than left LE      Transfers   Five time sit to stand comments  21 seconds with hands on thighs painful and effortful to initiate without UE support.                        Objective measurements completed on examination: See above findings.               PT Education - 03/15/21 1900     Education Details PT initial POC and HEP. Access Code: [email or web address]. Educated on graded progression of activity, moving within pain limited ranges and increasing based on tolerance.    Person(s) Educated Patient;Spouse    Methods Explanation;Demonstration;Handout;Tactile cues    Comprehension Verbalized understanding;Returned demonstration;Need further instruction              PT Short Term Goals - 03/15/21 1900       PT SHORT TERM GOAL #1   Title Independent with initial HEP    Time 2    Period Weeks    Status New    Target Date 03/30/21      PT SHORT TERM GOAL #2   Title [place] or DGI to be formally assessed by visit #3 and appropraite goal written    Time 2    Period Weeks    Status New    Target Date 03/30/21               PT Long Term Goals - 03/15/21 1900       PT LONG TERM GOAL #1   Title Independent with advanced HEP    Time 8    Period Weeks    Status New    Target Date 05/10/21      PT LONG TERM GOAL #2   Title FOTO improved to at least 50%    Baseline 34% at eval  Time 8    Period Weeks    Status New    Target Date 05/10/21      PT LONG TERM GOAL #3   Title Pt will  demonstrate full and symmetrical lumbar ROM with </= 2/10 pain    Time 8    Period Weeks    Status New    Target Date 05/10/21      PT LONG TERM GOAL #4   Title Pt will demonstrate improved functional core strength and BLE strength to at least 4+/5 with </= 2/10 pain    Time 8    Period Weeks    Status New    Target Date 05/11/21                    Plan - 03/15/21 1815     Clinical Impression Statement Pt is a 50 yo female with history of chronic LBP with radiculopathy into BLE Right side worse than left side. PMH significant for previous CVA and TIAs, she is followed by neurology. She presents with decreased lumbar flexion ROM with pain, decreased core strength, decreased LE strength and flexibility (right side less than left), abnormal gait. She also reports frequent falls.  She will benefit from skilled PT to address aforementioned impairments, work towards decreasing pain, improving functional mobility.    Stability/Clinical Decision Making Evolving/Moderate complexity    Clinical Decision Making Moderate    Rehab Potential Fair    PT Frequency 2x / week    PT Duration 8 weeks    PT Treatment/Interventions ADLs/Self Care Home Management;Cryotherapy;Electrical Stimulation;Moist Heat;Traction;Neuromuscular re-education;Balance training;Therapeutic exercise;Therapeutic activities;Functional mobility training;Patient/family education;Manual techniques;Energy conservation;Taping    PT Next Visit Plan Reassess HEP and progress as tolerated. Gentle progression to core stab and graded controlled lumbar mobility working towards decreased pain. Manual and modalities as needed for pain and to facilitate mobility.    PT Home Exercise Plan see pt instructions    Consulted and Agree with Plan of Care Patient;Family member/caregiver    Family Member Consulted husband             Patient will benefit from skilled therapeutic intervention in order to improve the following deficits and  impairments:  Abnormal gait, Difficulty walking, Decreased range of motion, Increased muscle spasms, Decreased mobility, Decreased strength, Decreased balance, Impaired flexibility, Improper body mechanics, Pain  Visit Diagnosis: Muscle weakness (generalized)  Cramp and spasm  Chronic bilateral low back pain with sciatica, sciatica laterality unspecified  Unsteadiness on feet     Problem List Patient Active Problem List   Diagnosis Date Noted   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) 10/05/2017   Other forms of dyspnea 10/03/2017   Sinus tachycardia 10/03/2017   Episode of recurrent major depressive disorder ([place]) 09/27/2017   Thrombocytopenia ([place]) 09/13/2017   Normocytic normochromic anemia 09/13/2017   Pericardial effusion 09/12/2017   Atypical chest pain 06/18/2016   Palpitations 06/18/2016   Depression 06/13/2016   Pseudoseizures ([place]) 06/08/2016   Seizure ([place]) 06/08/2016   Somatization disorder 06/08/2016   Anxiety 12/28/2015   TIA (transient ischemic attack) 12/28/2015  Tremor 12/28/2015   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   Cerebral infarction due to thrombosis of cerebral artery ([place]) 12/26/2012    Hall Busing, PT, DPT 03/16/2021, 10:09 AM  [place] [place], [place], [address] Phone: [phone]   Fax:  [phone]  Name: Mikeria Schipper MRN: [id number] Date of Birth: 1970-11-15
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
## 2021-03-17 DIAGNOSIS — M48062 Spinal stenosis, lumbar region with neurogenic claudication: Secondary | ICD-10-CM | POA: Diagnosis not present

## 2021-03-17 DIAGNOSIS — M5416 Radiculopathy, lumbar region: Secondary | ICD-10-CM | POA: Diagnosis not present

## 2021-03-20 DIAGNOSIS — E785 Hyperlipidemia, unspecified: Secondary | ICD-10-CM | POA: Insufficient documentation

## 2021-03-20 NOTE — Progress Notes (Signed)
Cardiology Office Note   Date:  03/21/2021   ID:  Desiree Russell, DOB 23-Aug-1970, MRN [id number]  PCP:  Desiree Francois, NP  Cardiologist:   Minus Breeding, MD Referring:  Desiree Francois, NP  Chief Complaint  Patient presents with   Palpitations       History of Present Illness: Desiree Russell is a 50 y.o. female who is referred by Desiree Francois, NP for evaluation of pericardial effusion.  This was noted on an echocardiogram at [facility]  The patient has a complicated history and has been at [facility]  and [facility].   She was at [facility] .   She was hospitalized and treated at [facility] for pericarditis.  This was in 2019.  Most recently she was hospitalized in June at [facility] and I was able to review these records.  There was a questionable CVA.  MRI showed an acute early or subacute infarct in the right parietal lobe although it was not entirely clear that all her symptoms are related to this.  As part of this work-up I do see that she had an echocardiogram.  I was interested to see this as there was no evidence of the previously mentioned pericardial effusion.  There was an otherwise unremarkable echocardiogram.  She was in the emergency room earlier this month with lightheadedness, difficulty with word finding.  However, there were no acute findings on MRI.  I reviewed extensive records from her hospitalization and his ED visit for this appointment.  She reports that she has had ankle swelling and right-sided weakness.  She is going to physical therapy for this and for back pain.  She still has some episodes of her heart rate racing.  Previously I had reduced her beta-blocker because of weakness.  She says she is still somewhat weak and wonders if she could go down on the dose.  She is not having any severe tachypalpitations, presyncope or syncope.  She is not having any new chest pressure, neck or arm discomfort.  She had no weight gain or edema.   Past Medical  History:  Diagnosis Date   Anxiety    Dyslipidemia    Fibromyalgia    Headache    Pericarditis    Seizures (HCC)    Tachycardia    TIA (transient ischemic attack)    Recurrent    Past Surgical History:  Procedure Laterality Date   ABDOMINAL HYSTERECTOMY     CHOLECYSTECTOMY, LAPAROSCOPIC       Current Outpatient Medications  Medication Sig Dispense Refill   acetaminophen (TYLENOL) 325 MG tablet Take 650 mg by mouth every 6 (six) hours as needed for mild pain, fever or headache.     albuterol (VENTOLIN HFA) 108 (90 Base) MCG/ACT inhaler INHALE 2 PUFFS INTO THE LUNGS EVERY 4 HOURS AS NEEDED FOR WHEEZING. (Patient taking differently: Inhale 2 puffs into the lungs every 4 (four) hours as needed for wheezing.) 8.5 g 0   ALPRAZolam (XANAX) 0.5 MG tablet Take 1 tablet (0.5 mg total) by mouth 2 (two) times daily as needed for anxiety. (Patient taking differently: Take 0.5 mg by mouth See admin instructions. 0.36m oral at bedtime And 0.516mdaily as needed for anxiety) 45 tablet 1   aspirin 81 MG EC tablet Take 1 tablet (81 mg total) by mouth daily. 21 tablet 0   atorvastatin (LIPITOR) 40 MG tablet Take 1 tablet (40 mg total) by mouth daily. 30 tablet 1   Calcium-Vitamin D-Vitamin  K 650-12.5-40 MG-MCG-MCG CHEW Chew 1 tablet by mouth daily.     clopidogrel (PLAVIX) 75 MG tablet Take 1 tablet (75 mg total) by mouth daily. 30 tablet 1   cyclobenzaprine (FLEXERIL) 10 MG tablet Take 5-10 mg by mouth at bedtime as needed for muscle spasms.     furosemide (LASIX) 20 MG tablet Take 1 tablet by mouth daily as needed. 90 tablet 3   gabapentin (NEURONTIN) 300 MG capsule Take 1 capsule (300 mg total) by mouth at bedtime. 90 capsule 2   Galcanezumab-gnlm (EMGALITY) 120 MG/ML SOSY Inject 120 mg under the skin every 28 days 1 mL 6   loratadine (CLARITIN) 10 MG tablet Take 10 mg by mouth daily.     Multiple Vitamin (MULTIVITAMIN WITH MINERALS) TABS tablet Take 1 tablet by mouth daily.     Na Sulfate-K  Sulfate-Mg Sulf (SUPREP BOWEL PREP KIT) 17.5-3.13-1.6 GM/177ML SOLN Take 1 kit by mouth as directed. For colonoscopy prep 354 mL 0   OLANZapine-FLUoxetine (SYMBYAX) 6-25 MG capsule Take 1 capsule by mouth at bedtime. 30 capsule 1   pantoprazole (PROTONIX) 40 MG tablet Take 1 tablet by mouth daily. 30 tablet 11   Ubrogepant (UBRELVY) 100 MG TABS TAKE  1/2 TABLET AT HEADACHE ONSET. MAY REPEAT DOSE IN TWO HOURS IF NO IMPROVEMENT. DO NOT EXCEED MORE THAN TWO TABLETS IN 24 HOURS. (Patient taking differently: Take 50-100 mg by mouth See admin instructions. Take 21m at headache onset, may repeat dose in two hours if no improvement. Do not excess more than 2059min 24 hours) 18 tablet 1   valACYclovir (VALTREX) 500 MG tablet Take 1 tablet (500 mg total) by mouth daily. 90 tablet 1   DULoxetine (CYMBALTA) 60 MG capsule Take 1 capsule (60 mg total) by mouth daily. 30 capsule 1   methocarbamol (ROBAXIN) 500 MG tablet Take 1/2 to 1 tablet by mouth nightly at bedtime as needed (Patient not taking: Reported on 03/21/2021) 30 tablet 5   metoprolol succinate (TOPROL-XL) 25 MG 24 hr tablet Take 1 tablet by mouth daily. 90 tablet 3   No current facility-administered medications for this visit.    Allergies:   Codeine, Morphine, Morphine and related, Oxycodone, and Chlorhexidine   ROS:  Please see the history of present illness.   Otherwise, review of systems are positive for none.   All other systems are reviewed and negative.    PHYSICAL EXAM: VS:  BP 133/77   Pulse 93   Ht _0  (1.626 m)   Wt 226 lb (102.5 kg)   SpO2 96%   BMI 38.79 kg/m  , BMI Body mass index is 38.79 kg/m. GENERAL:  Well appearing NECK:  No jugular venous distention, waveform within normal limits, carotid upstroke brisk and symmetric, no bruits, no thyromegaly LUNGS:  Clear to auscultation bilaterally CHEST:  Unremarkable HEART:  PMI not displaced or sustained,S1 and S2 within normal limits, no S3, no S4, no clicks, no rubs, no  murmurs ABD:  Flat, positive bowel sounds normal in frequency in pitch, no bruits, no rebound, no guarding, no midline pulsatile mass, no hepatomegaly, no splenomegaly EXT:  2 plus pulses throughout, no edema, no cyanosis no clubbing   EKG:  EKG is  ordered today. The ekg ordered today demonstrates sinus rhythm, rate 93, axis within normal limits, intervals within normal limits, no acute ST-T wave changes.   Recent Labs: 11/29/2020: BNP 7.1 01/21/2021: Magnesium 1.8; TSH 0.520 03/10/2021: ALT 15; BUN 9; Creatinine, Ser 0.90; Hemoglobin 13.6; Platelets  271; Potassium 4.3; Sodium 137    Lipid Panel    Component Value Date/Time   CHOL 175 01/21/2021 0156   CHOL 243 (H) 10/27/2020 0934   TRIG 127 01/21/2021 0156   HDL 40 (L) 01/21/2021 0156   HDL 58 10/27/2020 0934   CHOLHDL 4.4 01/21/2021 0156   VLDL 25 01/21/2021 0156   LDLCALC 110 (H) 01/21/2021 0156   LDLCALC 171 (H) 10/27/2020 0934      Wt Readings from Last 3 Encounters:  03/21/21 226 lb (102.5 kg)  03/21/21 225 lb (102.1 kg)  03/10/21 221 lb 12.5 oz (100.6 kg)      Other studies Reviewed: Additional studies/ records that were reviewed today include: Extensive review of hospital records Review of the above records demonstrates:  Please see elsewhere in the note.     ASSESSMENT AND PLAN:  PERICARDIAL EFFUSION:   I was able to review the echocardiogram from the hospital.  There was also able to see a previous [facility] report that she brought.  She had normal LV function.  She has no significant valvular abnormalities.  There is no evidence of pulmonary hypertension.  In particular there is no pericardial effusion.  No further work-up is suggested.  PALPITATIONS:   I am going to have her wear a 2-week monitor.  She can decrease her beta-blocker to 25 mg daily.  I will do this because of her complaint of fatigue.  DYSLIPIDEMIA: LDL was elevated but I do not see any evidence of coronary artery disease.  There was no evidence  of coronary calcium on her previous CT.  Given this I would not suggest change in therapy but would suggest dietary restriction and follow-up of her lipids.  Of note a CTA of her heart done in 2019 demonstrated no coronary artery disease.  PRE OP: She has been seen by GI for apparent colonoscopy.  We are asked to comment on whether she can discontinue Plavix.  However, the Plavix was not prescribed by this office.  This prescribed by neurology and they should be commenting on this as they had specifically use this as daily therapy in addition to 3 times per week aspirin.  I would not discontinue this without there consent..     Current medicines are reviewed at length with the patient today.  The patient does not have concerns regarding medicines.  The following changes have been made:  None  Labs/ tests ordered today include: None  Orders Placed This Encounter  Procedures   LONG TERM MONITOR (3-14 DAYS)   EKG 12-Lead      Disposition:   FU with me as needed based on the above results.     Signed, Minus Breeding, MD  03/21/2021 6:02 PM    [facility]

## 2021-03-21 ENCOUNTER — Encounter: Payer: Self-pay | Admitting: Cardiology

## 2021-03-21 ENCOUNTER — Ambulatory Visit (INDEPENDENT_AMBULATORY_CARE_PROVIDER_SITE_OTHER): Payer: 59 | Admitting: Cardiology

## 2021-03-21 ENCOUNTER — Other Ambulatory Visit (HOSPITAL_COMMUNITY): Payer: Self-pay

## 2021-03-21 ENCOUNTER — Other Ambulatory Visit: Payer: Self-pay

## 2021-03-21 ENCOUNTER — Telehealth: Payer: Self-pay

## 2021-03-21 ENCOUNTER — Ambulatory Visit (INDEPENDENT_AMBULATORY_CARE_PROVIDER_SITE_OTHER): Payer: 59 | Admitting: Gastroenterology

## 2021-03-21 ENCOUNTER — Encounter: Payer: Self-pay | Admitting: Gastroenterology

## 2021-03-21 ENCOUNTER — Ambulatory Visit: Payer: 59

## 2021-03-21 VITALS — BP 122/78 | HR 98 | Ht 64.0 in | Wt 225.0 lb

## 2021-03-21 VITALS — BP 133/77 | HR 93 | Ht 64.0 in | Wt 226.0 lb

## 2021-03-21 DIAGNOSIS — R002 Palpitations: Secondary | ICD-10-CM

## 2021-03-21 DIAGNOSIS — K625 Hemorrhage of anus and rectum: Secondary | ICD-10-CM | POA: Diagnosis not present

## 2021-03-21 DIAGNOSIS — K2100 Gastro-esophageal reflux disease with esophagitis, without bleeding: Secondary | ICD-10-CM | POA: Diagnosis not present

## 2021-03-21 DIAGNOSIS — E785 Hyperlipidemia, unspecified: Secondary | ICD-10-CM

## 2021-03-21 DIAGNOSIS — K581 Irritable bowel syndrome with constipation: Secondary | ICD-10-CM

## 2021-03-21 DIAGNOSIS — I3139 Other pericardial effusion (noninflammatory): Secondary | ICD-10-CM

## 2021-03-21 DIAGNOSIS — I313 Pericardial effusion (noninflammatory): Secondary | ICD-10-CM

## 2021-03-21 MED ORDER — FUROSEMIDE 20 MG PO TABS
20.0000 mg | ORAL_TABLET | Freq: Every day | ORAL | 3 refills | Status: DC | PRN
  Filled 2021-03-21: qty 90, 90d supply, fill #0
  Filled 2021-07-10: qty 90, 90d supply, fill #1

## 2021-03-21 MED ORDER — NA SULFATE-K SULFATE-MG SULF 17.5-3.13-1.6 GM/177ML PO SOLN
1.0000 | ORAL | 0 refills | Status: DC
  Filled 2021-03-21: qty 354, 1d supply, fill #0

## 2021-03-21 MED ORDER — METOPROLOL SUCCINATE ER 25 MG PO TB24
25.0000 mg | ORAL_TABLET | Freq: Every day | ORAL | 3 refills | Status: DC
  Filled 2021-03-21: qty 90, 90d supply, fill #0
  Filled 2021-06-11: qty 90, 90d supply, fill #1
  Filled 2021-09-06: qty 90, 90d supply, fill #2
  Filled 2021-12-06: qty 90, 90d supply, fill #3

## 2021-03-21 MED ORDER — PANTOPRAZOLE SODIUM 40 MG PO TBEC
40.0000 mg | DELAYED_RELEASE_TABLET | Freq: Every day | ORAL | 11 refills | Status: DC
  Filled 2021-03-21: qty 30, 30d supply, fill #0
  Filled 2021-04-09: qty 30, 30d supply, fill #1
  Filled 2021-05-10: qty 30, 30d supply, fill #2
  Filled 2021-06-11: qty 30, 30d supply, fill #3
  Filled 2021-07-10: qty 30, 30d supply, fill #4
  Filled 2021-08-15: qty 30, 30d supply, fill #5

## 2021-03-21 NOTE — Patient Instructions (Signed)
Medication Instructions:  Dr. Percival Spanish has prescribed LASIX '20mg'$  daily as needed Dr. Percival Spanish has decrease metoprolol succinate '25mg'$  daily  *If you need a refill on your cardiac medications before your next appointment, please call your pharmacy*  Testing/Procedures: [facility] Monitor Instructions  Your physician has requested you wear a ZIO patch monitor for 14 days.  This is a single patch monitor. [employer] supplies one patch monitor per enrollment. Additional stickers are not available. Please do not apply patch if you will be having a Nuclear Stress Test,  Echocardiogram, Cardiac CT, MRI, or Chest Xray during the period you would be wearing the  monitor. The patch cannot be worn during these tests. You cannot remove and re-apply the  ZIO XT patch monitor.  Your ZIO patch monitor will be mailed 3 day USPS to your address on file. It may take 3-5 days  to receive your monitor after you have been enrolled.  Once you have received your monitor, please review the enclosed instructions. Your monitor  has already been registered assigning a specific monitor serial # to you.  Billing and Patient Assistance Program Information  We have supplied [employer] with any of your insurance information on file for billing purposes. [employer] offers a sliding scale Patient Assistance Program for patients that do not have  insurance, or whose insurance does not completely cover the cost of the ZIO monitor.  You must apply for the Patient Assistance Program to qualify for this discounted rate.  To apply, please call [employer] at [phone], select option 4, select option 2, ask to apply for  Patient Assistance Program. Theodore Demark will ask your household income, and how many people  are in your household. They will quote your out-of-pocket cost based on that information.  [employer] will also be able to set up a 15-month interest-free payment plan if needed.  Applying the monitor   Shave hair from upper  left chest.  Hold abrader disc by orange tab. Rub abrader in 40 strokes over the upper left chest as  indicated in your monitor instructions.  Clean area with 4 enclosed alcohol pads. Let dry.  Apply patch as indicated in monitor instructions. Patch will be placed under collarbone on left  side of chest with arrow pointing upward.  Rub patch adhesive wings for 2 minutes. Remove white label marked "1". Remove the white  label marked "2". Rub patch adhesive wings for 2 additional minutes.  While looking in a mirror, press and release button in center of patch. A small green light will  flash 3-4 times. This will be your only indicator that the monitor has been turned on.  Do not shower for the first 24 hours. You may shower after the first 24 hours.  Press the button if you feel a symptom. You will hear a small click. Record Date, Time and  Symptom in the Patient Logbook.  When you are ready to remove the patch, follow instructions on the last 2 pages of Patient  Logbook. Stick patch monitor onto the last page of Patient Logbook.  Place Patient Logbook in the blue and white box. Use locking tab on box and tape box closed  securely. The blue and white box has prepaid postage on it. Please place it in the mailbox as  soon as possible. Your physician should have your test results approximately 7 days after the  monitor has been [facility]  Call [facility]at [phone] you have questions regarding  your ZIO XT patch monitor. Call them immediately if you see an orange light blinking on your  monitor.  If your monitor falls off in less than 4 days, contact our Monitor department at [phone].  If your monitor becomes loose or falls off after 4 days call [employer] at [phone] for  suggestions on securing your monitor    Follow-Up: At [facility], you and your health needs are our priority.  As part of our continuing mission to provide you  with exceptional heart care, we have created designated Provider Care Teams.  These Care Teams include your primary Cardiologist (physician) and Advanced Practice Providers (APPs -  Physician Assistants and Nurse Practitioners) who all work together to provide you with the care you need, when you need it.  We recommend signing up for the patient portal called "MyChart".  Sign up information is provided on this After Visit Summary.  MyChart is used to connect with patients for Virtual Visits (Telemedicine).  Patients are able to view lab/test results, encounter notes, upcoming appointments, etc.  Non-urgent messages can be sent to your provider as well.   To learn more about what you can do with MyChart, go to [email or web address].    Your next appointment:   6 month(s)  The format for your next appointment:   In Person  Provider:   You may see Minus Breeding, MD or one of the following Advanced Practice Providers on your designated Care Team:   Rosaria Ferries, PA-C Caron Presume, PA-C Jory Sims, DNP, ANP

## 2021-03-21 NOTE — Progress Notes (Unsigned)
Enrolled patient for a a 14 day Zio XT monitor to be mailed to patients home

## 2021-03-21 NOTE — Telephone Encounter (Addendum)
   Name: Desiree Russell  DOB: 19-Dec-1970  MRN: [id number]   Primary Cardiologist: Minus Breeding, MD  Chart reviewed as part of pre-operative protocol coverage. Patient is seeing Dr. Percival Spanish today at 4:20pm so will hold off on calling pt to assess symptoms. Will route to him and his nurse so they are aware that pre-procedure clearance is needed along with coming off Plavix for 5 days. Dr. Everlean Alstrom - please fax completed office note to GI team as below. Will otherwise remove from pre-op box as separate APP clearance input is not needed at this time. Thank you.  Charlie Pitter, PA-C 03/21/2021, 2:10 PM

## 2021-03-21 NOTE — Patient Instructions (Addendum)
Stop Nexium.   START: Pantoprazole '40mg'$  once daily.   We have sent the following medications to your pharmacy for you to pick up at your convenience: Suprep, Pantoprazole   You will be contaced by our office prior to your procedure for directions on holding your Plavix.  If you do not hear from our office 1 week prior to your scheduled procedure, please call [phone] to discuss.  Continue taking your Aspirin while holding plavix.   You have been scheduled for an endoscopy and colonoscopy. Please follow the written instructions given to you at your visit today. Please pick up your prep supplies at the pharmacy within the next 1-3 days. If you use inhalers (even only as needed), please bring them with you on the day of your procedure.  If you are age 34 or younger, your body mass index should be between 19-25. Your Body mass index is 38.62 kg/m. If this is out of the aformentioned range listed, please consider follow up with your Primary Care Provider.   __________________________________________________________  The  GI providers would like to encourage you to use [facility] to communicate with providers for non-urgent requests or questions.  Due to long hold times on the telephone, sending your provider a message by [facility] may be a faster and more efficient way to get a response.  Please allow 48 business hours for a response.  Please remember that this is for non-urgent requests.   Thank you for choosing me and [facility].  Dr.Gupta

## 2021-03-21 NOTE — Progress Notes (Signed)
Chief Complaint: For GI work-up.  Referring Provider:  Vevelyn Francois, NP      ASSESSMENT AND PLAN;   #1. GERD with eso dysphagia  #2. IBS-C with rectal bleeding.   Plan: -Switch nexium to protonix '40mg'$  po QD #30, 11 refills -EGD with dil/colon (2 day prep)after holding plavix 5 days and neurology or cardio clearence -Can continue ASA throughout. -Increase water intake. -For now, continue senna 3/day   I discussed EGD/Colonoscopy- the indications, risks, alternatives and potential complications including, but not limited to, bleeding, infection, reaction to medication, damage to internal organs, cardiac and/or pulmonary problems, and perforation requiring surgery (1 to 2 in 1000). The possibility that significant findings could be missed was explained. All ? were answered. The patient gives consent to proceed. HPI:    Desiree Russell is a 50 y.o. female  With H/O CVA (on ASA/plavix), fibromyalgia, HLD, anxiety, migraines, hypothyroidism, H/O ectopic pregnancy s/p R salpingo-oophorectomy with LOA, hysterectomy, cholecystectomy  C/O intermittent dysphagia to both solids and liquids x 1 year, per patient not related to stroke, mid chest, with occasional heartburn despite Nexium.  No odynophagia.  No nausea or vomiting.  Has longstanding history of constipation "all my life" and has been on senna for over 4-5 years.  She takes 3/day.  Rare rectal bleeding.  She does pass pellet-like stools.  Also associated abdominal bloating, lower abdominal crampy pain which gets better with defecation.  No weight loss or loss of appetite.  No fever chills or night sweats.  No jaundice dark urine or pale stools.  She does take calcium supplements.  No significant nonsteroidals.  She previously has been on narcotics which has been stopped over the last 1 year.  No sodas, chocolates, chewing gums, artificial sweeteners and candy.     Past GI work-up: EGD 09/2012 -Gastric ulcer. Bx- neg for HP  or malignancy -Mild gastritis  Colonoscopy12/2013 -Fair to poor prep -Mild melanosis coli -Highly redundant.  SH- Therapist, sports from [employer]. Mom, older sister with significant constipation  Past Medical History:  Diagnosis Date   Anxiety    Dyslipidemia    Fibromyalgia    Headache    Pericarditis    Seizures (HCC)    Tachycardia    TIA (transient ischemic attack)    Recurrent    Past Surgical History:  Procedure Laterality Date   ABDOMINAL HYSTERECTOMY     CHOLECYSTECTOMY, LAPAROSCOPIC      Family History  Problem Relation Age of Onset   Hypertension Mother    Hypertension Father    Heart attack Father 31   Healthy Sister    Healthy Sister    Healthy Sister    Diabetes type II Brother    COPD Brother    Healthy Brother    Healthy Brother    Hypertension Maternal Grandmother    Diabetes Paternal Grandmother    Throat cancer Maternal Uncle    Colon cancer Neg Hx    Pancreatic cancer Neg Hx    Stomach cancer Neg Hx    Liver disease Neg Hx     Social History   Tobacco Use   Smoking status: Never   Smokeless tobacco: Never  Substance Use Topics   Alcohol use: Yes    Comment: occ    Drug use: No    Current Outpatient Medications  Medication Sig Dispense Refill   acetaminophen (TYLENOL) 325 MG tablet Take 650 mg by mouth every 6 (six) hours as needed for mild pain, fever or  headache.     albuterol (VENTOLIN HFA) 108 (90 Base) MCG/ACT inhaler INHALE 2 PUFFS INTO THE LUNGS EVERY 4 HOURS AS NEEDED FOR WHEEZING. (Patient taking differently: Inhale 2 puffs into the lungs every 4 (four) hours as needed for wheezing.) 8.5 g 0   ALPRAZolam (XANAX) 0.5 MG tablet Take 1 tablet (0.5 mg total) by mouth 2 (two) times daily as needed for anxiety. (Patient taking differently: Take 0.5 mg by mouth See admin instructions. 0.'5mg'$  oral at bedtime And 0.'5mg'$  daily as needed for anxiety) 45 tablet 1   aspirin 81 MG EC tablet Take 1 tablet (81 mg total) by mouth daily. 21 tablet 0    atorvastatin (LIPITOR) 40 MG tablet Take 1 tablet (40 mg total) by mouth daily. 30 tablet 1   Calcium-Vitamin D-Vitamin K 650-12.5-40 MG-MCG-MCG CHEW Chew 1 tablet by mouth daily.     clopidogrel (PLAVIX) 75 MG tablet Take 1 tablet (75 mg total) by mouth daily. 30 tablet 1   cyclobenzaprine (FLEXERIL) 10 MG tablet Take 5-10 mg by mouth at bedtime as needed for muscle spasms.     esomeprazole (NEXIUM) 20 MG packet Take 20 mg by mouth daily before breakfast.     gabapentin (NEURONTIN) 300 MG capsule Take 1 capsule (300 mg total) by mouth at bedtime. 90 capsule 2   Galcanezumab-gnlm (EMGALITY) 120 MG/ML SOSY Inject 120 mg under the skin every 28 days 1 mL 6   loratadine (CLARITIN) 10 MG tablet Take 10 mg by mouth daily.     methocarbamol (ROBAXIN) 500 MG tablet Take 1/2 to 1 tablet by mouth nightly at bedtime as needed 30 tablet 5   metoprolol succinate (TOPROL-XL) 50 MG 24 hr tablet Take 1 tablet (50 mg total) by mouth daily. 90 tablet 3   Multiple Vitamin (MULTIVITAMIN WITH MINERALS) TABS tablet Take 1 tablet by mouth daily.     OLANZapine-FLUoxetine (SYMBYAX) 6-25 MG capsule Take 1 capsule by mouth at bedtime. 30 capsule 1   Ubrogepant (UBRELVY) 100 MG TABS TAKE  1/2 TABLET AT HEADACHE ONSET. MAY REPEAT DOSE IN TWO HOURS IF NO IMPROVEMENT. DO NOT EXCEED MORE THAN TWO TABLETS IN 24 HOURS. (Patient taking differently: Take 50-100 mg by mouth See admin instructions. Take '50mg'$  at headache onset, may repeat dose in two hours if no improvement. Do not excess more than '200mg'$  in 24 hours) 18 tablet 1   valACYclovir (VALTREX) 500 MG tablet Take 1 tablet (500 mg total) by mouth daily. 90 tablet 1   DULoxetine (CYMBALTA) 60 MG capsule Take 1 capsule (60 mg total) by mouth daily. 30 capsule 1   No current facility-administered medications for this visit.    Allergies  Allergen Reactions   Codeine Hives and Rash   Morphine Hives and Other (See Comments)    Headache   Morphine And Related Hives    Oxycodone Hives   Chlorhexidine Hives and Rash         Review of Systems:  Constitutional: Denies fever, chills, diaphoresis, appetite change and fatigue.  HEENT: Denies photophobia, eye pain, redness, hearing loss, ear pain, congestion, sore throat, rhinorrhea, sneezing, mouth sores, neck pain, neck stiffness and tinnitus.   Respiratory: Denies SOB, DOE, cough, chest tightness,  and wheezing.   Cardiovascular: Denies chest pain, palpitations and leg swelling.  Genitourinary: Denies dysuria, urgency, frequency, hematuria, flank pain and difficulty urinating.  Musculoskeletal: Denies myalgias, back pain, joint swelling, arthralgias and gait problem.  Skin: No rash.  Neurological: Denies dizziness, seizures, syncope, weakness, light-headedness,  numbness and headaches.  Hematological: Denies adenopathy. Easy bruising, personal or family bleeding history  Psychiatric/Behavioral: No anxiety or depression     Physical Exam:    BP 122/78   Pulse 98   Ht '5\' 4"'$  (1.626 m)   Wt 225 lb (102.1 kg)   SpO2 99%   BMI 38.62 kg/m  Wt Readings from Last 3 Encounters:  03/21/21 225 lb (102.1 kg)  03/10/21 221 lb 12.5 oz (100.6 kg)  01/23/21 207 lb 10.8 oz (94.2 kg)   Constitutional:  Well-developed, in no acute distress. Psychiatric: Normal mood and affect. Behavior is normal. HEENT: Pupils normal.  Conjunctivae are normal. No scleral icterus. Neck supple.  Cardiovascular: Normal rate, regular rhythm. No edema Pulmonary/chest: Effort normal and breath sounds normal. No wheezing, rales or rhonchi. Abdominal: Soft, nondistended. Nontender. Bowel sounds active throughout. There are no masses palpable. No hepatomegaly. Rectal: Deferred Neurological: Alert and oriented to person place and time. Skin: Skin is warm and dry. No rashes noted.  Data Reviewed: I have personally reviewed following labs and imaging studies  CBC: CBC Latest Ref Rng & Units 03/10/2021 03/10/2021 01/21/2021  WBC 4.0 - 10.5  K/uL - 6.1 6.1  Hemoglobin 12.0 - 15.0 g/dL 13.6 12.7 10.9(L)  Hematocrit 36.0 - 46.0 % 40.0 37.9 32.6(L)  Platelets 150 - 400 K/uL - 271 221    CMP: CMP Latest Ref Rng & Units 03/10/2021 03/10/2021 01/21/2021  Glucose 70 - 99 mg/dL 84 92 91  BUN 6 - 20 mg/dL '9 13 15  '$ Creatinine 0.44 - 1.00 mg/dL 0.90 0.90 0.94  Sodium 135 - 145 mmol/L 137 135 134(L)  Potassium 3.5 - 5.1 mmol/L 4.3 6.4(HH) 3.7  Chloride 98 - 111 mmol/L 107 106 106  CO2 22 - 32 mmol/L 24 - 23  Calcium 8.9 - 10.3 mg/dL 8.9 - 8.3(L)  Total Protein 6.5 - 8.1 g/dL 6.5 - -  Total Bilirubin 0.3 - 1.2 mg/dL 0.5 - -  Alkaline Phos 38 - 126 U/L 41 - -  AST 15 - 41 U/L 19 - -  ALT 0 - 44 U/L 15 - -    GFR: Estimated Creatinine Clearance: 89.9 mL/min (by C-G formula based on SCr of 0.9 mg/dL). Liver Function Tests: No results for input(s): AST, ALT, ALKPHOS, BILITOT, PROT, ALBUMIN in the last 168 hours. No results for input(s): LIPASE, AMYLASE in the last 168 hours. No results for input(s): AMMONIA in the last 168 hours. Coagulation Profile: No results for input(s): INR, PROTIME in the last 168 hours. HbA1C: No results for input(s): HGBA1C in the last 72 hours. Lipid Profile: No results for input(s): CHOL, HDL, LDLCALC, TRIG, CHOLHDL, LDLDIRECT in the last 72 hours. Thyroid Function Tests: No results for input(s): TSH, T4TOTAL, FREET4, T3FREE, THYROIDAB in the last 72 hours. Anemia Panel: No results for input(s): VITAMINB12, FOLATE, FERRITIN, TIBC, IRON, RETICCTPCT in the last 72 hours.  No results found for this or any previous visit (from the past 240 hour(s)).    Radiology Studies: MR BRAIN WO CONTRAST  Result Date: 03/10/2021 CLINICAL DATA:  Neuro deficit, acute, stroke suspected. Aphasia, right arm weakness, and bilateral leg weakness. History of stroke. EXAM: MRI HEAD WITHOUT CONTRAST TECHNIQUE: Multiplanar, multiecho pulse sequences of the brain and surrounding structures were obtained without intravenous  contrast. COMPARISON:  Head CT 03/10/2021 and MRI 01/20/2021 FINDINGS: Brain: There is no evidence of an acute infarct, intracranial hemorrhage, mass, midline shift, or extra-axial fluid collection. Small chronic infarcts in the left greater than  right cerebellar hemispheres are unchanged from the prior MRI, as is a small chronic right frontal cortical infarct. There is mild cerebral atrophy. Minimal scattered punctate T2 hyperintensities in the cerebral white matter bilaterally are unchanged and nonspecific though may reflect early chronic small vessel ischemia or migraines. Vascular: Major intracranial vascular flow voids are preserved. Skull and upper cervical spine: Unremarkable bone marrow signal. Sinuses/Orbits: Unremarkable orbits. Paranasal sinuses and mastoid air cells are clear. Other: None. IMPRESSION: 1. No acute intracranial abnormality. 2. Chronic cerebellar and right frontal lobe infarcts. Electronically Signed   By: Logan Bores M.D.   On: 03/10/2021 12:46   MR LUMBAR SPINE WO CONTRAST  Result Date: 03/10/2021 CLINICAL DATA:  Low back pain, cauda equina syndrome suspected. Bilateral leg weakness. EXAM: MRI LUMBAR SPINE WITHOUT CONTRAST TECHNIQUE: Multiplanar, multisequence MR imaging of the lumbar spine was performed. No intravenous contrast was administered. COMPARISON:  10/11/2019 FINDINGS: Segmentation:  Standard. Alignment:  Normal. Vertebrae: No fracture, suspicious marrow lesion, or significant marrow edema. Conus medullaris and cauda equina: Conus extends to the L1-2 level. Conus and cauda equina appear normal. Paraspinal and other soft tissues: Unremarkable. Disc levels: The lumbar spinal canal is capacious. Disc height and hydration are maintained from T11-12 to L4-5. At L5-S1, there is disc desiccation, minimal disc bulging, a shallow right central disc protrusion with annular fissure without associated stenosis. Lower lumbar facet arthrosis is again noted, moderate to severe on the  right at L4-5 and moderate bilaterally at L5-S1. Right-sided facet arthrosis at L4-5 has likely mildly progressed, and there is a joint effusion which is larger than on the prior study. IMPRESSION: 1. Unchanged minimal disc bulging and shallow right central disc protrusion at L5-S1 without stenosis. 2. Lower lumbar facet arthrosis, most severe on the right at L4-5. Electronically Signed   By: Logan Bores M.D.   On: 03/10/2021 13:03   CT HEAD CODE STROKE WO CONTRAST  Result Date: 03/10/2021 CLINICAL DATA:  Code stroke. 50 year old female with acute onset weakness and aphasia. EXAM: CT HEAD WITHOUT CONTRAST TECHNIQUE: Contiguous axial images were obtained from the base of the skull through the vertex without intravenous contrast. COMPARISON:  Brain MRI 01/20/2021.  Head CT 02/13/2021. FINDINGS: Brain: No midline shift, ventriculomegaly, mass effect, evidence of mass lesion, intracranial hemorrhage or evidence of cortically based acute infarction. Gray-white matter differentiation appears stable and within normal limits. No cortical encephalomalacia identified, but multiple small chronic infarcts in the left cerebellum were demonstrated by MRI in June. Vascular: No suspicious intracranial vascular hyperdensity. Skull: Negative skull. Congenital incomplete ossification of the posterior C1 ring. Sinuses/Orbits: Visualized paranasal sinuses and mastoids are stable and well aerated. Other: Stable mildly Disconjugate gaze. Visualized scalp soft tissues are within normal limits. ASPECTS [facility] Stroke Program Early CT Score) Total score (0-10 with 10 being normal): 10 IMPRESSION: 1. No acute cortically based infarct or acute intracranial hemorrhage identified. ASPECTS 10. 2. Multiple small chronic left cerebellar infarcts better demonstrated by MRI. 3. These results were communicated to Dr. Rory Percy at 10:10 am on 03/10/2021 by text page via the [facility] messaging system. Electronically Signed   By: Genevie Ann M.D.   On:  03/10/2021 10:10      Carmell Austria, MD 03/21/2021, 9:39 AM  Cc: Vevelyn Francois, NP

## 2021-03-21 NOTE — Telephone Encounter (Signed)
Request for surgical clearance:     Endoscopy Procedure  What type of surgery is being performed?     Endo/Colon   When is this surgery scheduled?     04/25/21  What type of clearance is required ?   Pharmacy  Are there any medications that need to be held prior to surgery and how long? Plavix x5 days prior to procedure  Practice name and name of physician performing surgery?      [facility]-Dr Lyndel Safe   What is your office phone and fax number?      Phone- [phone]  [phone]  Anesthesia type (None, local, MAC, general) ?       MAC

## 2021-03-22 ENCOUNTER — Ambulatory Visit: Payer: 59 | Admitting: Physical Therapy

## 2021-03-22 ENCOUNTER — Encounter: Payer: Self-pay | Admitting: Physical Therapy

## 2021-03-22 ENCOUNTER — Ambulatory Visit: Payer: 59

## 2021-03-22 DIAGNOSIS — M5440 Lumbago with sciatica, unspecified side: Secondary | ICD-10-CM | POA: Diagnosis not present

## 2021-03-22 DIAGNOSIS — R2681 Unsteadiness on feet: Secondary | ICD-10-CM

## 2021-03-22 DIAGNOSIS — R252 Cramp and spasm: Secondary | ICD-10-CM

## 2021-03-22 DIAGNOSIS — G8929 Other chronic pain: Secondary | ICD-10-CM | POA: Diagnosis not present

## 2021-03-22 DIAGNOSIS — M6281 Muscle weakness (generalized): Secondary | ICD-10-CM

## 2021-03-22 NOTE — Therapy (Signed)
[place] [place], [place], [address] Phone: [phone]   Fax:  [phone]  Physical Therapy Treatment  Patient Details  Name: Desiree Russell MRN: [id number] Date of Birth: 1971/05/27 Referring Provider (PT): Meeler, [place] FNP -physiatry   Encounter Date: 03/22/2021   PT End of Session - 03/22/21 1725     Visit Number 2    Number of Visits 25    Date for PT Re-Evaluation 05/10/21    Authorization Type UMR    PT Start Time [id number]    PT Stop Time 1740    PT Time Calculation (min) 50 min    Activity Tolerance Patient tolerated treatment well;Patient limited by fatigue;Patient limited by pain    Behavior During Therapy [facility] for tasks assessed/performed             Past Medical History:  Diagnosis Date   Anxiety    Dyslipidemia    Fibromyalgia    Headache    Pericarditis    Seizures (HCC)    Tachycardia    TIA (transient ischemic attack)    Recurrent    Past Surgical History:  Procedure Laterality Date   ABDOMINAL HYSTERECTOMY     CHOLECYSTECTOMY, LAPAROSCOPIC      There were no vitals filed for this visit.   Subjective Assessment - 03/22/21 1656     Subjective Patient reports that she is in a lot of pain reports pain an 8/10 , reports exercises make it worse    Currently in Pain? Yes    Pain Score 8     Pain Location Back    Pain Orientation Right;Lower    Pain Descriptors / Indicators Aching    Pain Radiating Towards right posterior leg                               [facility] Adult PT Treatment/Exercise - 03/22/21 0001       Exercises   Exercises Lumbar      Lumbar Exercises: Stretches   Passive Hamstring Stretch Right;Left;3 reps;20 seconds    Piriformis Stretch Right;Left;4 reps;20 seconds      Lumbar Exercises: Supine   Pelvic Tilt 5 reps;5 seconds    Clam 20 reps;1 second    Bridge with [employer] 20 reps;1 second    Bridge with [employer] Limitations  very very small bridge    Other Supine Lumbar Exercises feet on ball K2C, trunk rotation, posterior activation (tiny bridges), isometrica abs      Modalities   Modalities Electrical Stimulation;Moist Heat      Moist Heat Therapy   Number Minutes Moist Heat 10 Minutes    Moist Heat Location Lumbar Spine      Electrical Stimulation   Electrical Stimulation Location right lumbar and buttock    Electrical Stimulation Action IFC    Electrical Stimulation Parameters supine    Electrical Stimulation Goals Pain                      PT Short Term Goals - 03/22/21 1728       PT SHORT TERM GOAL #1   Title Independent with initial HEP    Status On-going               PT Long Term Goals - 03/15/21 1900       PT LONG TERM GOAL #1   Title Independent with advanced HEP  Time 8    Period Weeks    Status New    Target Date 05/10/21      PT LONG TERM GOAL #2   Title FOTO improved to at least 50%    Baseline 34% at eval    Time 8    Period Weeks    Status New    Target Date 05/10/21      PT LONG TERM GOAL #3   Title Pt will demonstrate full and symmetrical lumbar ROM with </= 2/10 pain    Time 8    Period Weeks    Status New    Target Date 05/10/21      PT LONG TERM GOAL #4   Title Pt will demonstrate improved functional core strength and BLE strength to at least 4+/5 with </= 2/10 pain    Time 8    Period Weeks    Status New    Target Date 05/11/21                   Plan - 03/22/21 1726     Clinical Impression Statement Patient comes in limping on the right and forward flexed trunk, reports really hurting today, just got off work.  Rates pain an 8/10, reports the exercises made it worse, I went over these with her and explained the need for movements and mobility to help with pain and tightness.  She seemed to tolerate the exercises that we did today without a great deal of increased pain, but was obviously hurting.  Right piroformis was very  tight and painful    PT Next Visit Plan Reassess HEP and progress as tolerated. Gentle progression to core stab and graded controlled lumbar mobility working towards decreased pain. Manual and modalities as needed for pain and to facilitate mobility.  Needs Arts development officer instruction for ADL's    Consulted and Agree with Plan of Care Patient;Family member/caregiver             Patient will benefit from skilled therapeutic intervention in order to improve the following deficits and impairments:  Abnormal gait, Difficulty walking, Decreased range of motion, Increased muscle spasms, Decreased mobility, Decreased strength, Decreased balance, Impaired flexibility, Improper body mechanics, Pain  Visit Diagnosis: Muscle weakness (generalized)  Cramp and spasm  Chronic bilateral low back pain with sciatica, sciatica laterality unspecified  Unsteadiness on feet     Problem List Patient Active Problem List   Diagnosis Date Noted   Dyslipidemia 03/20/2021   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 01/22/2021   CVA (cerebral vascular accident) ([place]) 01/20/2021   Compulsive skin picking 10/24/2020   Brain fog 10/24/2020   History of COVID-19 10/24/2020   Cough 10/24/2020   Right sided weakness 03/14/2018   Chronic migraine 03/14/2018   Loss of consciousness ([place]) 03/14/2018   Chronic adhesive idiopathic pericarditis 10/15/2017   Panic attacks 10/05/2017   Paroxysmal SVT (supraventricular tachycardia) ([place]) 10/05/2017   Other forms of dyspnea 10/03/2017   Sinus tachycardia 10/03/2017   Episode of recurrent major depressive disorder ([place]) 09/27/2017   Thrombocytopenia ([place]) 09/13/2017   Normocytic normochromic anemia 09/13/2017   Pericardial effusion 09/12/2017   Atypical chest pain 06/18/2016   Palpitations 06/18/2016   Depression 06/13/2016   Pseudoseizures ([place]) 06/08/2016   Seizure ([place]) 06/08/2016   Somatization disorder 06/08/2016   Anxiety 12/28/2015    TIA (transient ischemic attack) 12/28/2015   Tremor 12/28/2015   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   Cerebral infarction due to  thrombosis of cerebral artery ([place]) 12/26/2012    Sumner Boast., PT 03/22/2021, 5:29 PM  [place] [place], [place], [address] Phone: [phone]   Fax:  [phone]  Name: Desiree Russell MRN: [id number] Date of Birth: Sep 08, 1970

## 2021-03-22 NOTE — Telephone Encounter (Signed)
03/21/21 MD office note routed via [place] fax to Dr. Steve Rattler office @ number provided

## 2021-03-24 NOTE — Telephone Encounter (Addendum)
Per Dr Rosezella [place] office note dated 03/21/21,  "PRE OP: She has been seen by GI for apparent colonoscopy.  We are asked to comment on whether she can discontinue Plavix.  However, the Plavix was not prescribed by this office.  This prescribed by neurology and they should be commenting on this as they had specifically use this as daily therapy in addition to 3 times per week aspirin.  I would not discontinue this without there consent."    We will need to send clearance note to Dr Gurney Maxin or Melrose Nakayama, PA at [facility]. A Plavix clearance letter has been sent to these individuals both electronically and manually (phone [phone], fax [phone])

## 2021-03-25 ENCOUNTER — Other Ambulatory Visit (HOSPITAL_COMMUNITY): Payer: Self-pay

## 2021-03-25 MED ORDER — CLOPIDOGREL BISULFATE 75 MG PO TABS
ORAL_TABLET | ORAL | 1 refills | Status: DC
  Filled 2021-03-25: qty 90, 90d supply, fill #0
  Filled 2021-06-11: qty 90, 90d supply, fill #1

## 2021-03-27 ENCOUNTER — Other Ambulatory Visit: Payer: Self-pay | Admitting: *Deleted

## 2021-03-27 ENCOUNTER — Other Ambulatory Visit (HOSPITAL_COMMUNITY): Payer: Self-pay

## 2021-03-27 DIAGNOSIS — R002 Palpitations: Secondary | ICD-10-CM

## 2021-03-27 MED ORDER — ATORVASTATIN CALCIUM 40 MG PO TABS
40.0000 mg | ORAL_TABLET | Freq: Every day | ORAL | 3 refills | Status: DC
  Filled 2021-03-27: qty 90, 90d supply, fill #0

## 2021-03-28 ENCOUNTER — Other Ambulatory Visit: Payer: Self-pay

## 2021-03-28 ENCOUNTER — Telehealth: Payer: Self-pay

## 2021-03-28 ENCOUNTER — Encounter: Payer: Self-pay | Admitting: Physical Therapy

## 2021-03-28 ENCOUNTER — Ambulatory Visit: Payer: 59 | Admitting: Physical Therapy

## 2021-03-28 DIAGNOSIS — R252 Cramp and spasm: Secondary | ICD-10-CM | POA: Diagnosis not present

## 2021-03-28 DIAGNOSIS — G8929 Other chronic pain: Secondary | ICD-10-CM | POA: Diagnosis not present

## 2021-03-28 DIAGNOSIS — M6281 Muscle weakness (generalized): Secondary | ICD-10-CM

## 2021-03-28 DIAGNOSIS — R2681 Unsteadiness on feet: Secondary | ICD-10-CM

## 2021-03-28 DIAGNOSIS — M5440 Lumbago with sciatica, unspecified side: Secondary | ICD-10-CM | POA: Diagnosis not present

## 2021-03-28 NOTE — Telephone Encounter (Signed)
Left message on patients voicemail to please return our call.   

## 2021-03-28 NOTE — Therapy (Signed)
[place]. [place], [place], [address] Phone: [phone]   Fax:  [phone]  Physical Therapy Treatment  Patient Details  Name: Desiree Russell MRN: [id number] Date of Birth: [birth year] Referring Provider (PT): Meeler, [place] FNP -physiatry   Encounter Date: 03/28/2021   PT End of Session - 03/28/21 1729     Visit Number 3    Number of Visits 25    Date for PT Re-Evaluation 05/10/21    Authorization Type UMR    PT Start Time [id number]    PT Stop Time 1735    PT Time Calculation (min) 45 min    Activity Tolerance Patient tolerated treatment well;Patient limited by pain    Behavior During Therapy [facility] for tasks assessed/performed             Past Medical History:  Diagnosis Date   Anxiety    Dyslipidemia    Fibromyalgia    Headache    Pericarditis    Seizures (HCC)    Tachycardia    TIA (transient ischemic attack)    Recurrent    Past Surgical History:  Procedure Laterality Date   ABDOMINAL HYSTERECTOMY     CHOLECYSTECTOMY, LAPAROSCOPIC      There were no vitals filed for this visit.   Subjective Assessment - 03/28/21 1652     Subjective Having to bend over when washing dishes d/t pain. Notes no benefit from cortisone injection.    Pertinent History 2019 fall down stairs with injury to B ankles. Hx of stroke june 2022 and several TIAs before that with residual weakness    Diagnostic tests Lumbar MRI 03/10/21 IMPRESSION:  1. Unchanged minimal disc bulging and shallow right central disc  protrusion at L5-S1 without stenosis.  2. Lower lumbar facet arthrosis, most severe on the right at L4-5.    Patient Stated Goals to decrease pain, improve strength/flexibility, be able to go up and down stairs, be able to walk more easily and less pain    Currently in Pain? Yes    Pain Score 5     Pain Location Back    Pain Orientation Right    Pain Descriptors / Indicators Aching    Pain Type Acute pain;Chronic  pain                               [facility] Adult PT Treatment/Exercise - 03/28/21 0001       Lumbar Exercises: Aerobic   Stationary Bike L1 x 4 min    UBE (Upper Arm Bike) L1 x 2 min forward      Lumbar Exercises: Seated   Other Seated Lumbar Exercises prayer stretch with pball 5x3" forward/R/L; pelvic tilts anterior/posterior 10x      Lumbar Exercises: Supine   Isometric Hip Flexion 10 reps;5 seconds    Isometric Hip Flexion Limitations LEs on pball   cues to avoid valsalva   Other Supine Lumbar Exercises alt bent knee fallout with red TB 2x10   cues to maintain neutral spine and core tight     Lumbar Exercises: Sidelying   Other Sidelying Lumbar Exercises R/L open book stretch 10x3"   ROM to tolerance     Lumbar Exercises: Quadruped   Other Quadruped Lumbar Exercises child's pose 5x10"   noting good tolerance during, increased pain after     Moist Heat Therapy   Number Minutes Moist Heat 10 Minutes    Moist  Heat Location Lumbar Spine      Electrical Stimulation   Electrical Stimulation Location B lumbar region    Electrical Stimulation Action pre-mod    Electrical Stimulation Parameters supine; output to tolerance; 10 min    Electrical Stimulation Goals Pain                    PT Education - 03/28/21 1729     Education Details update to HEP    Person(s) Educated Patient    Methods Explanation;Demonstration;Tactile cues;Verbal cues;Handout    Comprehension Verbalized understanding;Returned demonstration              PT Short Term Goals - 03/28/21 1730       PT SHORT TERM GOAL #1   Title Independent with initial HEP    Time 2    Period Weeks    Status On-going    Target Date 03/30/21      PT SHORT TERM GOAL #2   Title [place] or DGI to be formally assessed by visit #3 and appropraite goal written    Time 2    Period Weeks    Status On-going    Target Date 03/30/21               PT Long Term Goals - 03/28/21 1731        PT LONG TERM GOAL #1   Title Independent with advanced HEP    Time 8    Period Weeks    Status On-going      PT LONG TERM GOAL #2   Title FOTO improved to at least 50%    Baseline 34% at eval    Time 8    Period Weeks    Status On-going      PT LONG TERM GOAL #3   Title Pt will demonstrate full and symmetrical lumbar ROM with </= 2/10 pain    Time 8    Period Weeks    Status On-going      PT LONG TERM GOAL #4   Title Pt will demonstrate improved functional core strength and BLE strength to at least 4+/5 with </= 2/10 pain    Time 8    Period Weeks    Status On-going                   Plan - 03/28/21 1730     Clinical Impression Statement Patient reporting moderate R LBP today and noting no benefit from cortisone injection. Worked on gentle lumbopelvic ROM exercises to assess for directional preference. Patient tolerated lumbar flexion positioning well and noted improved comfort in flexion vs. extension. Thus, updated HEP with gentle flexion-based exercises that were well-tolerated today. Supine exercises were performed with knees bent/hooklying for max comfort. Patient demonstrated fairly good hip stability with strengthening ther-ex.  Ended session with moist heat and e-stim to LB for pain relief. Patient reported improvement in pain at end of session.    PT Next Visit Plan Reassess HEP and progress as tolerated. Gentle progression to core stab and graded controlled lumbar mobility working towards decreased pain. Manual and modalities as needed for pain and to facilitate mobility.  Needs Arts development officer instruction for ADL's    Consulted and Agree with Plan of Care Patient             Patient will benefit from skilled therapeutic intervention in order to improve the following deficits and impairments:  Abnormal gait, Difficulty walking, Decreased range of motion, Increased  muscle spasms, Decreased mobility, Decreased strength, Decreased balance, Impaired flexibility,  Improper body mechanics, Pain  Visit Diagnosis: Muscle weakness (generalized)  Cramp and spasm  Chronic bilateral low back pain with sciatica, sciatica laterality unspecified  Unsteadiness on feet     Problem List Patient Active Problem List   Diagnosis Date Noted   Dyslipidemia 03/20/2021   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 01/22/2021   CVA (cerebral vascular accident) ([place]) 01/20/2021   Compulsive skin picking 10/24/2020   Brain fog 10/24/2020   History of COVID-19 10/24/2020   Cough 10/24/2020   Right sided weakness 03/14/2018   Chronic migraine 03/14/2018   Loss of consciousness ([place]) 03/14/2018   Chronic adhesive idiopathic pericarditis 10/15/2017   Panic attacks 10/05/2017   Paroxysmal SVT (supraventricular tachycardia) ([place]) 10/05/2017   Other forms of dyspnea 10/03/2017   Sinus tachycardia 10/03/2017   Episode of recurrent major depressive disorder ([place]) 09/27/2017   Thrombocytopenia ([place]) 09/13/2017   Normocytic normochromic anemia 09/13/2017   Pericardial effusion 09/12/2017   Atypical chest pain 06/18/2016   Palpitations 06/18/2016   Depression 06/13/2016   Pseudoseizures ([place]) 06/08/2016   Seizure ([place]) 06/08/2016   Somatization disorder 06/08/2016   Anxiety 12/28/2015   TIA (transient ischemic attack) 12/28/2015   Tremor 12/28/2015   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   Cerebral infarction due to thrombosis of cerebral artery ([place]) 12/26/2012     Janene Harvey, PT, DPT 03/28/21 5:40 PM    [place]. [place], [place], [address] Phone: [phone]   Fax:  [phone]  Name: Desiree Russell MRN: [id number] Date of Birth: 1971/02/12

## 2021-03-28 NOTE — Telephone Encounter (Signed)
Spoke with patient regarding results and recommendation.  Patient verbalizes understanding and is agreeable to plan of care. Advised patient to call back with any issues or concerns.  

## 2021-03-28 NOTE — Telephone Encounter (Signed)
-----   Message from Park Liter, MD sent at 03/28/2021  8:41 AM EDT ----- Echocardiogram showed preserved left ventricle ejection fraction there is mild to moderate mitral regurgitation.  No intervention needed.

## 2021-03-29 ENCOUNTER — Telehealth: Payer: Self-pay | Admitting: Cardiology

## 2021-03-29 ENCOUNTER — Emergency Department (HOSPITAL_COMMUNITY): Payer: 59

## 2021-03-29 ENCOUNTER — Telehealth: Payer: Self-pay | Admitting: Internal Medicine

## 2021-03-29 ENCOUNTER — Emergency Department (HOSPITAL_COMMUNITY)
Admission: EM | Admit: 2021-03-29 | Discharge: 2021-03-29 | Disposition: A | Discharge status: Home / Self Care | Payer: 59 | Attending: Student | Admitting: Student

## 2021-03-29 DIAGNOSIS — R0602 Shortness of breath: Secondary | ICD-10-CM | POA: Insufficient documentation

## 2021-03-29 DIAGNOSIS — Z8616 Personal history of COVID-19: Secondary | ICD-10-CM | POA: Insufficient documentation

## 2021-03-29 DIAGNOSIS — R0789 Other chest pain: Secondary | ICD-10-CM | POA: Insufficient documentation

## 2021-03-29 DIAGNOSIS — R4182 Altered mental status, unspecified: Secondary | ICD-10-CM | POA: Insufficient documentation

## 2021-03-29 DIAGNOSIS — Z7982 Long term (current) use of aspirin: Secondary | ICD-10-CM | POA: Diagnosis not present

## 2021-03-29 DIAGNOSIS — J9811 Atelectasis: Secondary | ICD-10-CM | POA: Diagnosis not present

## 2021-03-29 DIAGNOSIS — R000 Tachycardia, unspecified: Secondary | ICD-10-CM | POA: Diagnosis not present

## 2021-03-29 DIAGNOSIS — I517 Cardiomegaly: Secondary | ICD-10-CM | POA: Diagnosis not present

## 2021-03-29 DIAGNOSIS — I313 Pericardial effusion (noninflammatory): Secondary | ICD-10-CM | POA: Diagnosis not present

## 2021-03-29 DIAGNOSIS — R0689 Other abnormalities of breathing: Secondary | ICD-10-CM | POA: Diagnosis not present

## 2021-03-29 DIAGNOSIS — I10 Essential (primary) hypertension: Secondary | ICD-10-CM | POA: Diagnosis not present

## 2021-03-29 DIAGNOSIS — R079 Chest pain, unspecified: Secondary | ICD-10-CM | POA: Diagnosis not present

## 2021-03-29 LAB — COMPREHENSIVE METABOLIC PANEL
ALT: 17 U/L (ref 0–44)
AST: 25 U/L (ref 15–41)
Albumin: 4.2 g/dL (ref 3.5–5.0)
Alkaline Phosphatase: 51 U/L (ref 38–126)
Anion gap: 8 (ref 5–15)
BUN: 15 mg/dL (ref 6–20)
CO2: 26 mmol/L (ref 22–32)
Calcium: 9.6 mg/dL (ref 8.9–10.3)
Chloride: 102 mmol/L (ref 98–111)
Creatinine, Ser: 0.91 mg/dL (ref 0.44–1.00)
GFR, Estimated: >60 mL/min (ref >60)
Glucose, Bld: 104 mg/dL — ABNORMAL HIGH (ref 70–99)
Potassium: 3.9 mmol/L (ref 3.5–5.1)
Sodium: 136 mmol/L (ref 135–145)
Total Bilirubin: 0.5 mg/dL (ref 0.3–1.2)
Total Protein: 7.3 g/dL (ref 6.5–8.1)

## 2021-03-29 LAB — CBC WITH DIFFERENTIAL/PLATELET
Abs Immature Granulocytes: 0.02 10*3/uL (ref 0.00–0.07)
Basophils Absolute: 0 10*3/uL (ref 0.0–0.1)
Basophils Relative: 1 %
Eosinophils Absolute: 0.1 10*3/uL (ref 0.0–0.5)
Eosinophils Relative: 1 %
HCT: 39.1 % (ref 36.0–46.0)
Hemoglobin: 12.6 g/dL (ref 12.0–15.0)
Immature Granulocytes: 0 %
Lymphocytes Relative: 33 %
Lymphs Abs: 2.5 10*3/uL (ref 0.7–4.0)
MCH: 29.6 pg (ref 26.0–34.0)
MCHC: 32.2 g/dL (ref 30.0–36.0)
MCV: 92 fL (ref 80.0–100.0)
Monocytes Absolute: 0.6 10*3/uL (ref 0.1–1.0)
Monocytes Relative: 7 %
Neutro Abs: 4.5 10*3/uL (ref 1.7–7.7)
Neutrophils Relative %: 58 %
Platelets: 200 10*3/uL (ref 150–400)
RBC: 4.25 MIL/uL (ref 3.87–5.11)
RDW: 13.3 % (ref 11.5–15.5)
WBC: 7.7 10*3/uL (ref 4.0–10.5)
nRBC: 0 % (ref 0.0–0.2)

## 2021-03-29 LAB — URINALYSIS, ROUTINE W REFLEX MICROSCOPIC
Bilirubin Urine: NEGATIVE
Glucose, UA: NEGATIVE mg/dL
Hgb urine dipstick: NEGATIVE
Ketones, ur: NEGATIVE mg/dL
Leukocytes,Ua: NEGATIVE
Nitrite: NEGATIVE
Protein, ur: NEGATIVE mg/dL
Specific Gravity, Urine: 1.011 (ref 1.005–1.030)
pH: 7 (ref 5.0–8.0)

## 2021-03-29 LAB — BRAIN NATRIURETIC PEPTIDE: B Natriuretic Peptide: 8.4 pg/mL (ref 0.0–100.0)

## 2021-03-29 LAB — TROPONIN I (HIGH SENSITIVITY)
Troponin I (High Sensitivity): 6 ng/L (ref <18)
Troponin I (High Sensitivity): 5 ng/L (ref <18)

## 2021-03-29 LAB — D-DIMER, QUANTITATIVE: D-Dimer, Quant: 4.05 ug/mL-FEU — ABNORMAL HIGH (ref 0.00–0.50)

## 2021-03-29 IMAGING — CT CT ANGIO CHEST
2 of 6 series · 19 of 36 positions shown · IV contrast (omnipaque)
Comparison: Chest radiograph [DATE] and chest CT from
[DATE]

CLINICAL DATA: Chest pain.  Tachycardia.  Elevated D-dimer.

EXAM:
CT ANGIOGRAPHY CHEST WITH CONTRAST
TECHNIQUE: Multidetector CT imaging of the chest was performed using the
standard protocol during bolus administration of intravenous
contrast. Multiplanar CT image reconstructions and MIPs were
obtained to evaluate the vascular anatomy.
CONTRAST:  60mL OMNIPAQUE IOHEXOL 350 MG/ML SOLN

[Series 7: pe thins · axial · 0.68mm/px · z∈[+1273,+1480]mm · 18 of 330 slices shown]
[im 17/330  lung]
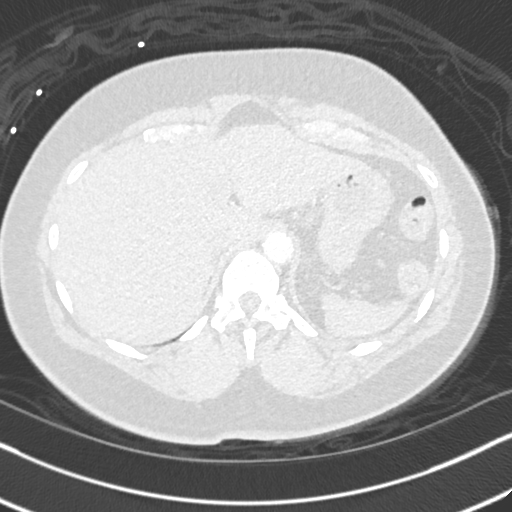
[im 33/330  mediastinal]
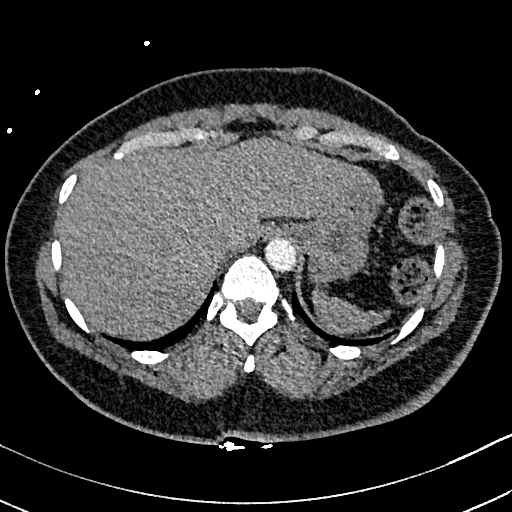
[im 50/330  lung]
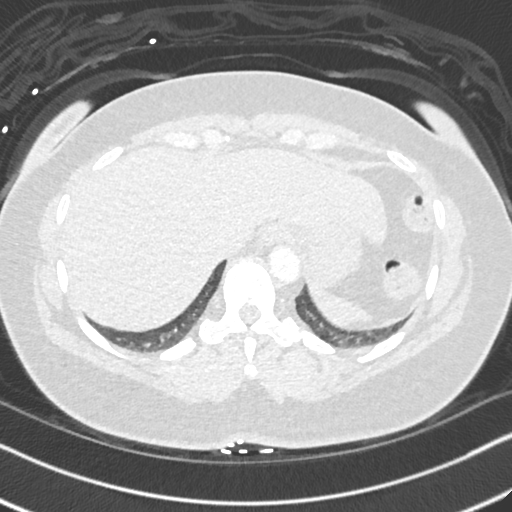
[im 66/330  mediastinal]
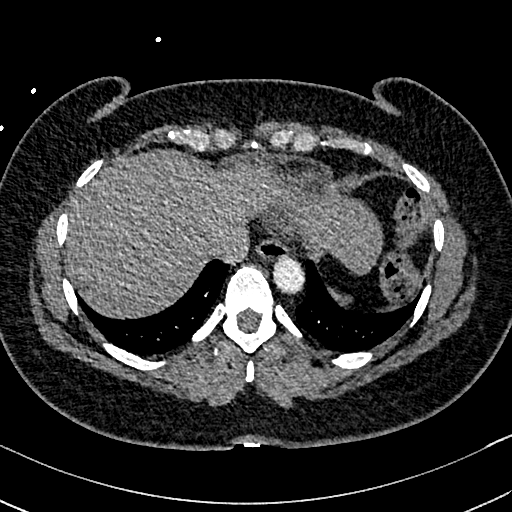
[im 83/330  lung]
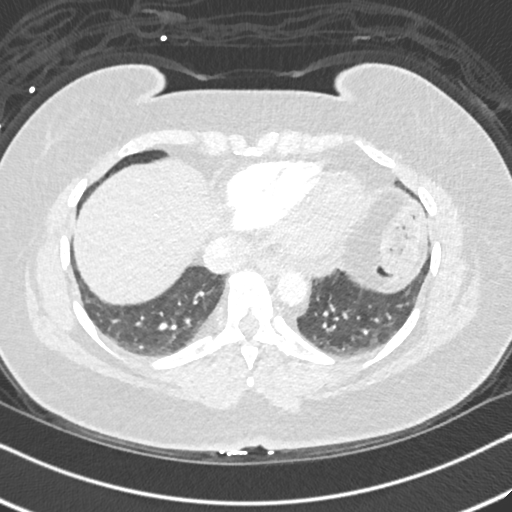
[im 99/330  mediastinal]
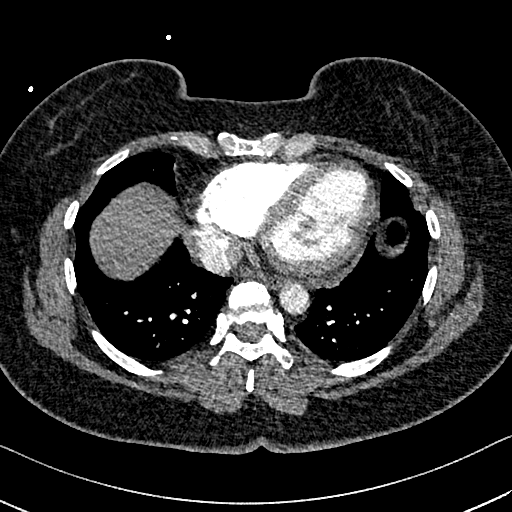
[im 116/330  lung]
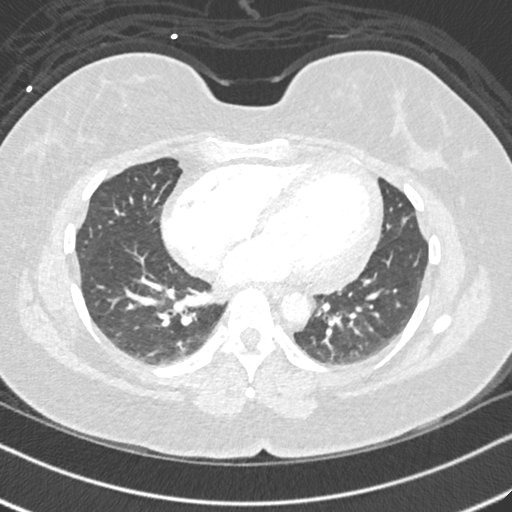
[im 132/330  mediastinal]
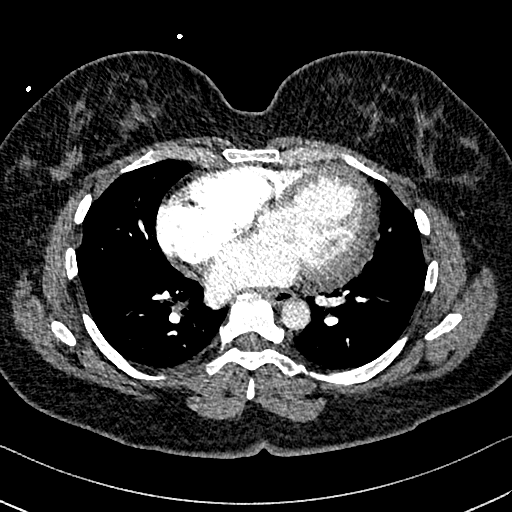
[im 149/330  lung]
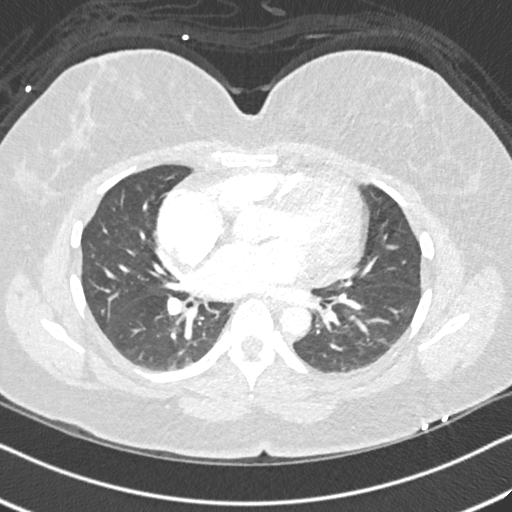
[im 181/330  mediastinal]
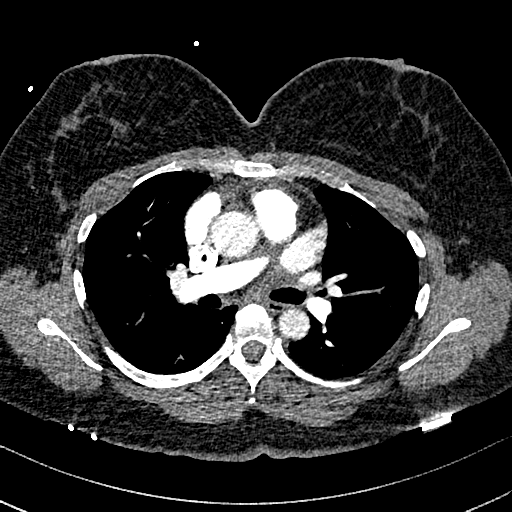
[im 198/330  lung]
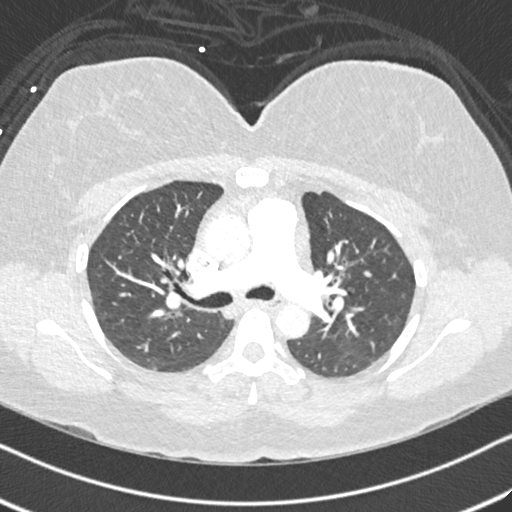
[im 214/330  mediastinal]
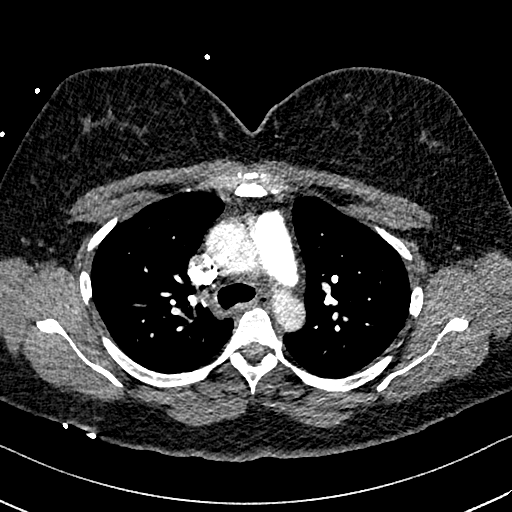
[im 231/330  lung]
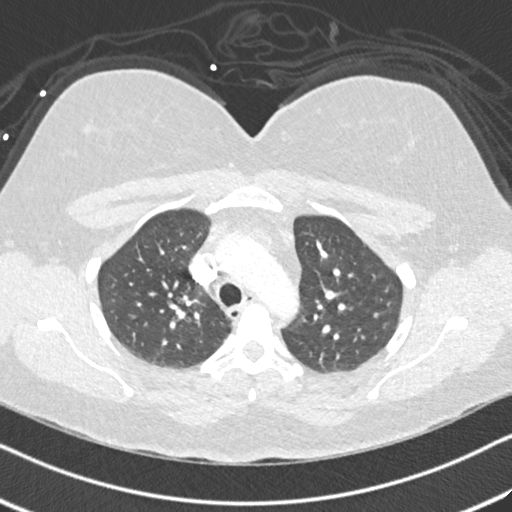
[im 247/330  mediastinal]
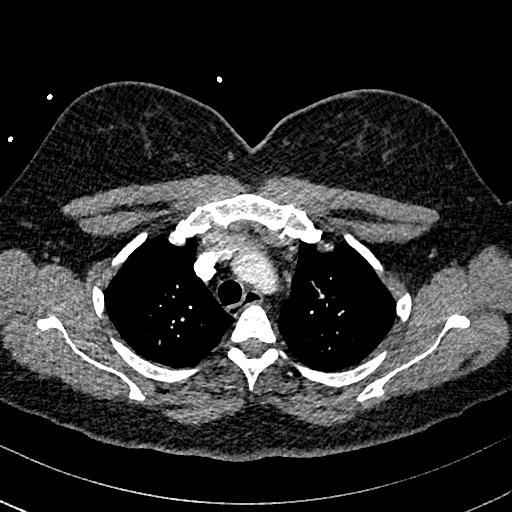
[im 264/330  lung]
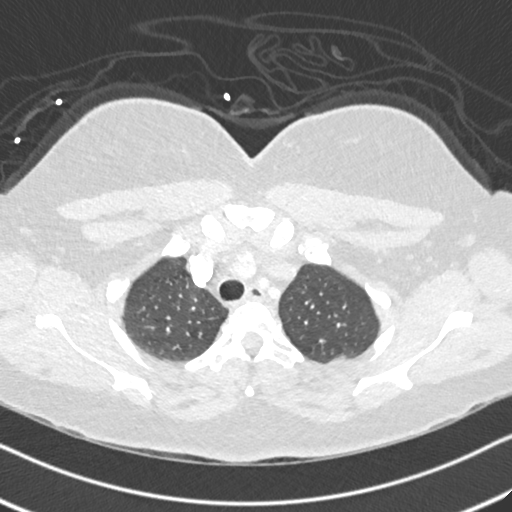
[im 280/330  mediastinal]
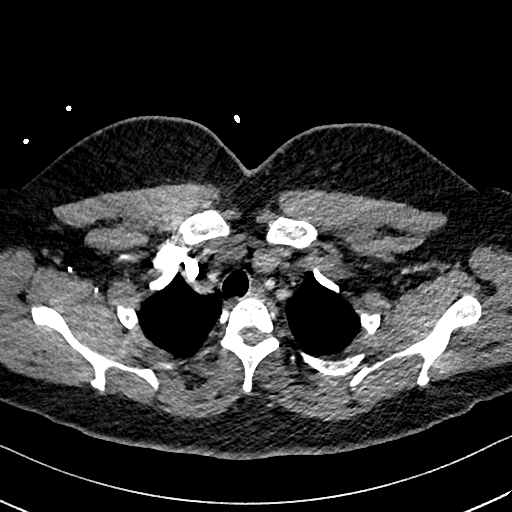
[im 297/330  lung]
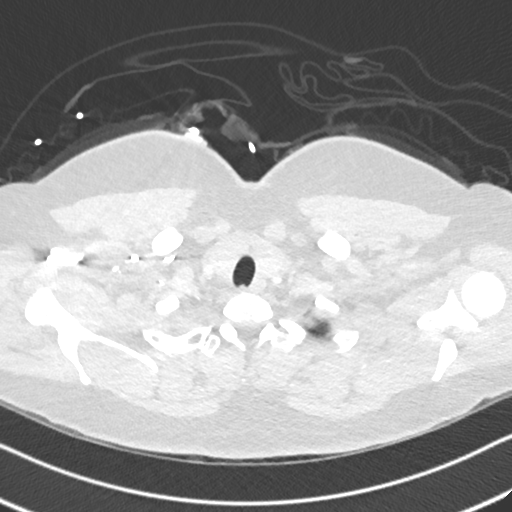
[im 313/330  mediastinal]
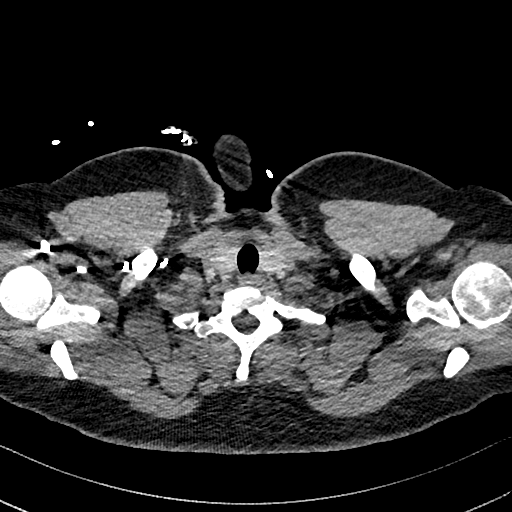

[Series 8: pe 2mm cor · coronal · 0.50mm/px · 1 of 127 slices shown]
[im 64/127  mediastinal]
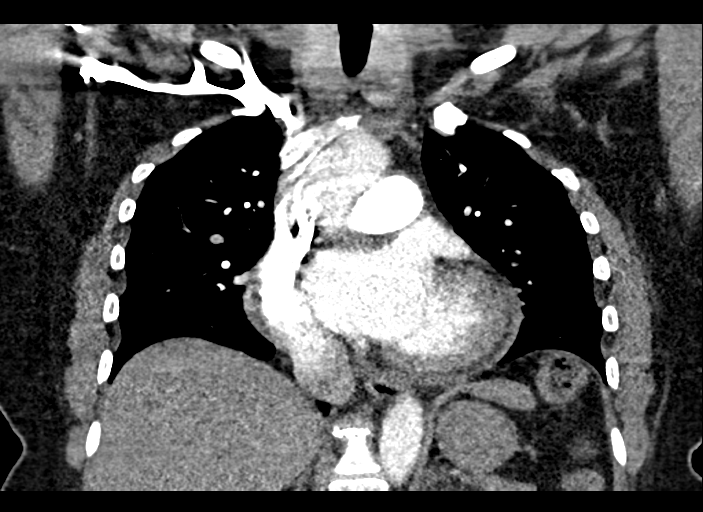

[19 of 36 positions shown; findings below may reference images not displayed]

FINDINGS: Cardiovascular: No filling defect is identified in the pulmonary
arterial tree to suggest pulmonary embolus. Small pericardial
effusion primarily seen posteriorly, similar to the [DATE] exam.
Borderline cardiomegaly.

Mediastinum/Nodes: Unremarkable

Lungs/Pleura: Mild airway thickening is observed, potentially from
bronchitis or reactive airways disease.

Upper Abdomen: Unremarkable

Musculoskeletal: Mild thoracic spondylosis.

Review of the MIP images confirms the above findings.
IMPRESSION: 1. No filling defect is identified in the pulmonary arterial tree to
suggest pulmonary embolus.
2. Stable small pericardial effusion and borderline cardiomegaly.
3. Airway thickening is present, suggesting bronchitis or reactive
airways disease.

## 2021-03-29 IMAGING — DX DG CHEST 1V PORT
1 series · 1 of 1 positions shown · non-contrast
Comparison: [DATE]

CLINICAL DATA: Chest pain radiating to the upper neck.

EXAM:
PORTABLE CHEST 1 VIEW

[chest]
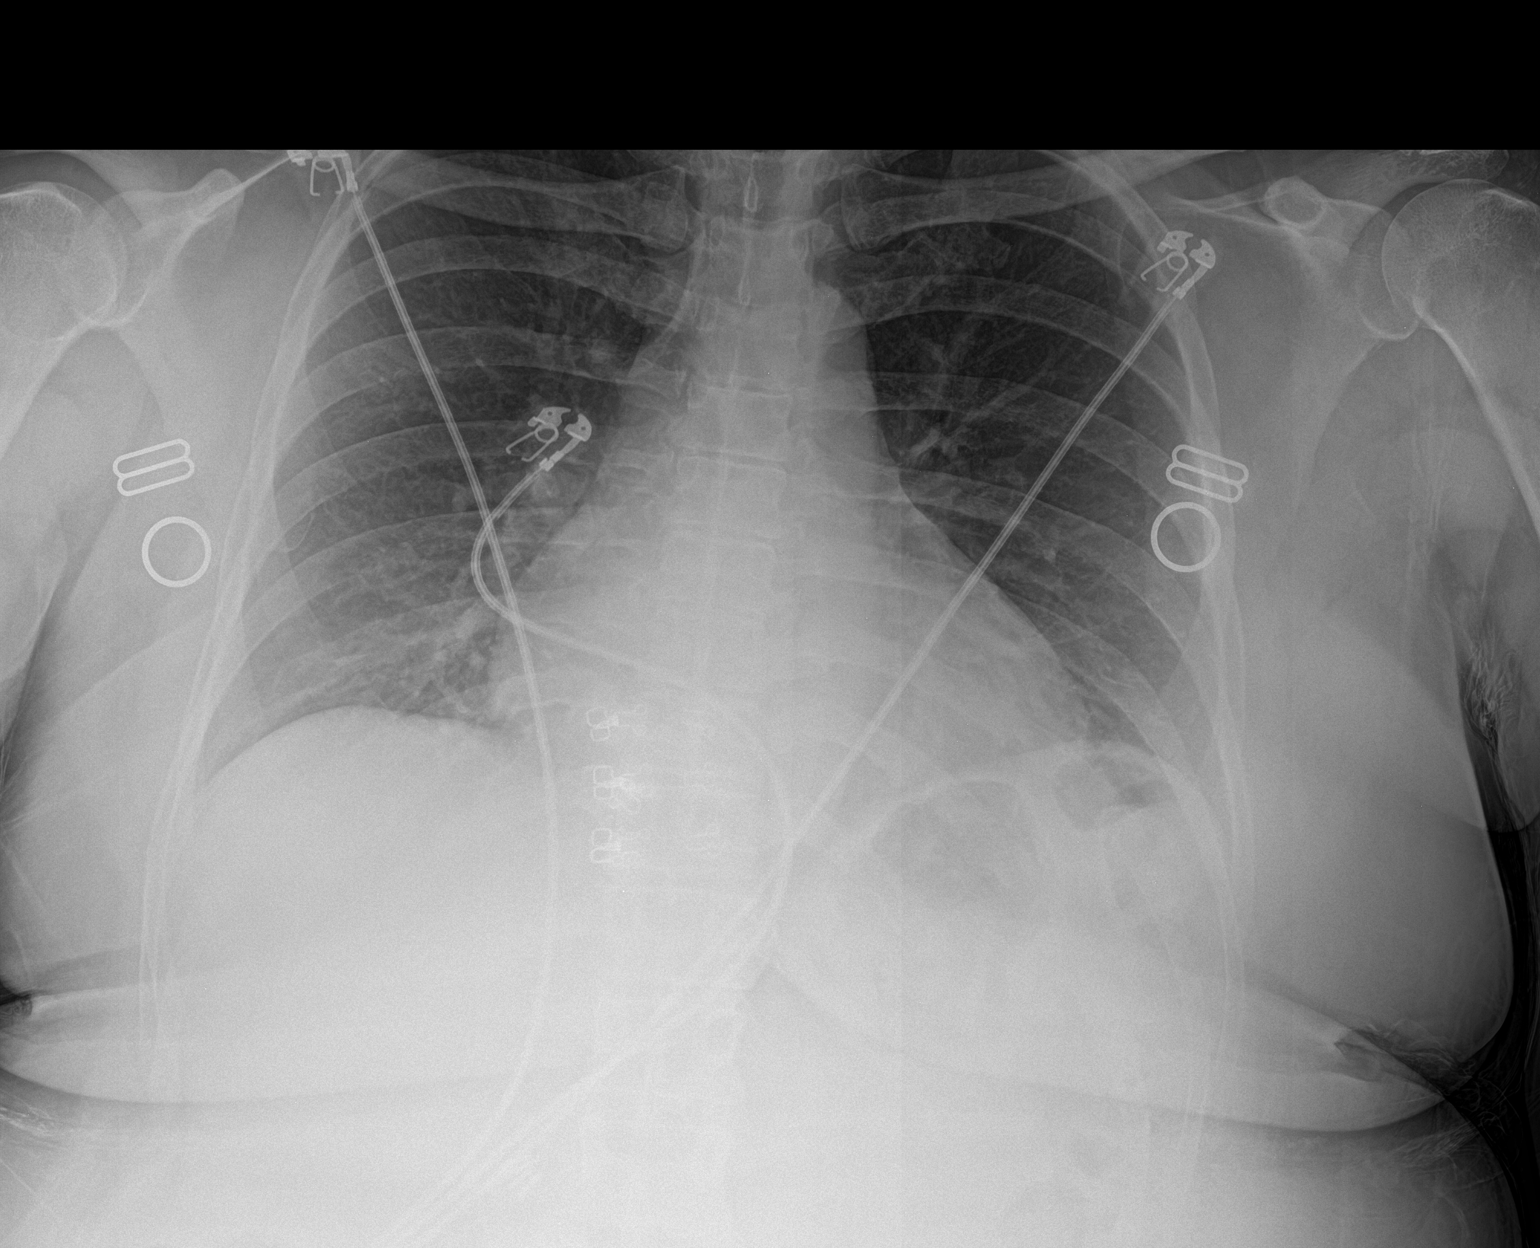

[1 of 1 positions shown; findings below may reference images not displayed]

FINDINGS: Decreased lung volumes are seen with very mild atelectasis noted
within the bilateral lung bases. There is no evidence of acute
infiltrate, pleural effusion or pneumothorax. The heart size and
mediastinal contours are within normal limits. The visualized
skeletal structures are unremarkable.
IMPRESSION: Low lung volumes without acute or active cardiopulmonary disease

## 2021-03-29 MED ORDER — IOHEXOL 350 MG/ML SOLN
60.0000 mL | Freq: Once | INTRAVENOUS | Status: AC | PRN
  Administered 2021-03-29: 60 mL via INTRAVENOUS

## 2021-03-29 MED ORDER — NITROGLYCERIN 0.4 MG SL SUBL
0.4000 mg | SUBLINGUAL_TABLET | SUBLINGUAL | Status: DC | PRN
Start: 2021-03-29 — End: 2021-03-30
  Administered 2021-03-29: 0.4 mg via SUBLINGUAL

## 2021-03-29 MED ORDER — ONDANSETRON 4 MG PO TBDP
4.0000 mg | ORAL_TABLET | Freq: Once | ORAL | Status: AC
  Administered 2021-03-29: 4 mg via ORAL
  Filled 2021-03-29: qty 1

## 2021-03-29 MED ORDER — FENTANYL CITRATE PF 50 MCG/ML IJ SOSY
25.0000 ug | PREFILLED_SYRINGE | Freq: Once | INTRAMUSCULAR | Status: AC
  Administered 2021-03-29: 25 ug via INTRAVENOUS
  Filled 2021-03-29: qty 1

## 2021-03-29 MED ORDER — ACETAMINOPHEN 500 MG PO TABS
1000.0000 mg | ORAL_TABLET | Freq: Once | ORAL | Status: AC
  Administered 2021-03-29: 1000 mg via ORAL
  Filled 2021-03-29: qty 2

## 2021-03-29 NOTE — ED Provider Notes (Signed)
[facility] EMERGENCY DEPARTMENT Provider Note   CSN: [id number] Arrival date & time: 03/29/21  1546     History Chief Complaint  Patient presents with   Chest Pain   Altered Mental Status    Will not let us help her     Desiree Russell is a 50 y.o. female with PMH fibromyalgia, pericarditis, TIA, anxiety, HLD who presents emergency department for evaluation of chest pain and shortness of breath.  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  She was given 325 of aspirin by EMS and 2 doses of nitroglycerin.  On arrival she is alert and oriented answering questions appropriately, tearful and anxious appearing.  She is tacky to 108 but is otherwise hemodynamically stable.  Denies abdominal pain, nausea, headache, cough, fever or other systemic symptoms.   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/2021   CVA (cerebral vascular accident) ([place]) 01/20/2021   Compulsive skin picking 10/24/2020   Brain fog 10/24/2020   History of COVID-19 10/24/2020   Cough 10/24/2020   Right sided weakness 03/14/2018   Chronic migraine 03/14/2018   Loss of consciousness ([place]) 03/14/2018   Chronic adhesive idiopathic pericarditis 10/15/2017   Panic attacks 10/05/2017   Paroxysmal SVT  (supraventricular tachycardia) ([place]) 10/05/2017   Other forms of dyspnea 10/03/2017   Sinus tachycardia 10/03/2017   Episode of recurrent major depressive disorder ([place]) 09/27/2017   Thrombocytopenia ([place]) 09/13/2017   Normocytic normochromic anemia 09/13/2017   Pericardial effusion 09/12/2017   Atypical chest pain 06/18/2016   Palpitations 06/18/2016   Depression 06/13/2016   Pseudoseizures ([place]) 06/08/2016   Seizure ([place]) 06/08/2016   Somatization disorder 06/08/2016   Anxiety 12/28/2015   TIA (transient ischemic attack) 12/28/2015   Tremor 12/28/2015   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     Family History  Problem Relation Age of Onset   Hypertension Mother    Hypertension Father    Heart attack Father 2   Healthy Sister    Healthy Sister    Healthy Sister    Diabetes type II Brother    COPD Brother    Healthy Brother    Healthy Brother    Hypertension Maternal Grandmother    Diabetes Paternal Grandmother    Throat cancer Maternal Uncle    Colon cancer Neg Hx    Pancreatic cancer Neg Hx    Stomach cancer Neg Hx    Liver disease Neg Hx  Social History   Tobacco Use   Smoking status: Never   Smokeless tobacco: Never  Substance Use Topics   Alcohol use: Yes    Comment: occ    Drug use: No    Home Medications Prior to Admission medications   Medication Sig Start Date End Date Taking? Authorizing Provider  acetaminophen (TYLENOL) 325 MG tablet Take 650 mg by mouth every 6 (six) hours as needed for mild pain, fever or headache.    [provider]  albuterol (VENTOLIN HFA) 108 (90 Base) MCG/ACT inhaler INHALE 2 PUFFS INTO THE LUNGS EVERY 4 HOURS AS NEEDED FOR WHEEZING. Patient taking differently:  Inhale 2 puffs into the lungs every 4 (four) hours as needed for wheezing. 10/20/20 10/20/21  Fenton Foy, NP  ALPRAZolam Duanne Moron) 0.5 MG tablet Take 1 tablet (0.5 mg total) by mouth 2 (two) times daily as needed for anxiety. Patient taking differently: Take 0.5 mg by mouth See admin instructions. 0.74m oral at bedtime And 0.564mdaily as needed for anxiety 02/14/21 02/14/22  Arfeen, SyArlyce HarmanMD  aspirin 81 MG EC tablet Take 1 tablet (81 mg total) by mouth daily. 01/23/21   Mikhail, MaVelta AddisonDO  atorvastatin (LIPITOR) 40 MG tablet Take 1 tablet (40 mg total) by mouth daily. 03/27/21   HoMinus BreedingMD  Calcium-Vitamin D-Vitamin K 650-12.5-40 MG-MCG-MCG CHEW Chew 1 tablet by mouth daily.    [provider]  clopidogrel (PLAVIX) 75 MG tablet Take 1 tablet by mouth daily. 03/25/21     cyclobenzaprine (FLEXERIL) 10 MG tablet Take 5-10 mg by mouth at bedtime as needed for muscle spasms. 02/28/21   [provider]  DULoxetine (CYMBALTA) 60 MG capsule Take 1 capsule (60 mg total) by mouth daily. 02/14/21 04/02/21  Arfeen, SyArlyce HarmanMD  furosemide (LASIX) 20 MG tablet Take 1 tablet by mouth daily as needed. 03/21/21 06/19/21  HoMinus BreedingMD  gabapentin (NEURONTIN) 300 MG capsule Take 1 capsule (300 mg total) by mouth at bedtime. 02/22/21 02/22/22  KiVevelyn FrancoisNP  Galcanezumab-gnlm (ED[facility]120 MG/ML SOSY Inject 120 mg under the skin every 28 days 02/15/21     loratadine (CLARITIN) 10 MG tablet Take 10 mg by mouth daily.    [provider]  methocarbamol (ROBAXIN) 500 MG tablet Take 1/2 to 1 tablet by mouth nightly at bedtime as needed 02/24/21     metoprolol succinate (TOPROL-XL) 25 MG 24 hr tablet Take 1 tablet by mouth daily. 03/21/21 03/21/22  HoMinus BreedingMD  Multiple Vitamin (MULTIVITAMIN WITH MINERALS) TABS tablet Take 1 tablet by mouth daily.    [provider]  Na Sulfate-K Sulfate-Mg Sulf (SUPREP BOWEL PREP KIT) 17.5-3.13-1.6 GM/177ML SOLN Take 1 kit by mouth as  directed. For colonoscopy prep 03/21/21   GuJackquline DenmarkMD  OLANZapine-FLUoxetine (SS[facility]6-25 MG capsule Take 1 capsule by mouth at bedtime. 02/14/21   Arfeen, SyArlyce HarmanMD  pantoprazole (PROTONIX) 40 MG tablet Take 1 tablet by mouth daily. 03/21/21   GuJackquline DenmarkMD  Ubrogepant (UBRELVY) 100 MG TABS TAKE  1/2 TABLET AT HEADACHE ONSET. MAY REPEAT DOSE IN TWO HOURS IF NO IMPROVEMENT. DO NOT EXCEED MORE THAN TWO TABLETS IN 24 HOURS. Patient taking differently: Take 50-100 mg by mouth See admin instructions. Take 5044mt headache onset, may repeat dose in two hours if no improvement. Do not excess more than 200m27m 24 hours 02/15/21     valACYclovir (VALTREX) 500 MG tablet Take 1 tablet (500 mg total) by mouth  daily. 08/02/20       Allergies    Codeine, Morphine, Morphine and related, Oxycodone, and Chlorhexidine  Review of Systems   Review of Systems  Constitutional:  Negative for chills and fever.  HENT:  Negative for ear pain and sore throat.   Eyes:  Negative for pain and visual disturbance.  Respiratory:  Positive for chest tightness and shortness of breath. Negative for cough.   Cardiovascular:  Positive for chest pain. Negative for palpitations.  Gastrointestinal:  Negative for abdominal pain and vomiting.  Genitourinary:  Negative for dysuria and hematuria.  Musculoskeletal:  Negative for arthralgias and back pain.  Skin:  Negative for color change and rash.  Neurological:  Negative for seizures and syncope.  All other systems reviewed and are negative.  Physical Exam Updated Vital Signs BP (!) 131/91   Pulse 88   Temp 97.9 F (36.6 C) (Oral)   Resp 14   Ht 5' 4"  (1.626 m)   Wt 102.5 kg   SpO2 98%   BMI 38.79 kg/m   Physical Exam Vitals and nursing note reviewed.  Constitutional:      General: She is not in acute distress.    Appearance: She is well-developed.  HENT:     Head: Normocephalic and atraumatic.  Eyes:     Conjunctiva/sclera: Conjunctivae normal.   Cardiovascular:     Rate and Rhythm: Regular rhythm. Tachycardia present.     Heart sounds: No murmur heard. Pulmonary:     Effort: Pulmonary effort is normal. No respiratory distress.     Breath sounds: Normal breath sounds.  Abdominal:     Palpations: Abdomen is soft.     Tenderness: There is no abdominal tenderness.  Musculoskeletal:     Cervical back: Neck supple.  Skin:    General: Skin is warm and dry.  Neurological:     Mental Status: She is alert.    ED Results / Procedures / Treatments   Labs (all labs ordered are listed, but only abnormal results are displayed) Labs Reviewed  COMPREHENSIVE METABOLIC PANEL - Abnormal; Notable for the following components:      Result Value   Glucose, Bld 104 (*)    All other components within normal limits  URINALYSIS, ROUTINE W REFLEX MICROSCOPIC - Abnormal; Notable for the following components:   APPearance HAZY (*)    All other components within normal limits  D-DIMER, QUANTITATIVE (NOT AT [facility]) - Abnormal; Notable for the following components:   D-Dimer, Quant 4.05 (*)    All other components within normal limits  CBC WITH DIFFERENTIAL/PLATELET  BRAIN NATRIURETIC PEPTIDE  CBC WITH DIFFERENTIAL/PLATELET  TROPONIN I (HIGH SENSITIVITY)  TROPONIN I (HIGH SENSITIVITY)    EKG EKG Interpretation  Date/Time:  Wednesday March 29 2021 15:50:17 EDT Ventricular Rate:  107 PR Interval:  132 QRS Duration: 79 QT Interval:  320 QTC Calculation: 427 R Axis:   20 Text Interpretation: Sinus tachycardia Left ventricular hypertrophy Confirmed by Jovany Disano ([id number]) on 03/29/2021 6:02:25 PM  Radiology CT Angio Chest PE W and/or Wo Contrast  Result Date: 03/29/2021 CLINICAL DATA:  Chest pain.  Tachycardia.  Elevated D-dimer. EXAM: CT ANGIOGRAPHY CHEST WITH CONTRAST TECHNIQUE: Multidetector CT imaging of the chest was performed using the standard protocol during bolus administration of intravenous contrast. Multiplanar CT image  reconstructions and MIPs were obtained to evaluate the vascular anatomy. CONTRAST:  76m OMNIPAQUE IOHEXOL 350 MG/ML SOLN COMPARISON:  Chest radiograph 03/29/2021 and chest CT from 09/08/2020 FINDINGS: Cardiovascular: No filling defect  is identified in the pulmonary arterial tree to suggest pulmonary embolus. Small pericardial effusion primarily seen posteriorly, similar to the 09/08/2020 exam. Borderline cardiomegaly. Mediastinum/Nodes: Unremarkable Lungs/Pleura: Mild airway thickening is observed, potentially from bronchitis or reactive airways disease. Upper Abdomen: Unremarkable Musculoskeletal: Mild thoracic spondylosis. Review of the MIP images confirms the above findings. IMPRESSION: 1. No filling defect is identified in the pulmonary arterial tree to suggest pulmonary embolus. 2. Stable small pericardial effusion and borderline cardiomegaly. 3. Airway thickening is present, suggesting bronchitis or reactive airways disease. Electronically Signed   By: Van Clines M.D.   On: 03/29/2021 21:08   DG Chest Portable 1 View  Result Date: 03/29/2021 CLINICAL DATA:  Chest pain radiating to the upper neck. EXAM: PORTABLE CHEST 1 VIEW COMPARISON:  January 20, 2021 FINDINGS: Decreased lung volumes are seen with very mild atelectasis noted within the bilateral lung bases. There is no evidence of acute infiltrate, pleural effusion or pneumothorax. The heart size and mediastinal contours are within normal limits. The visualized skeletal structures are unremarkable. IMPRESSION: Low lung volumes without acute or active cardiopulmonary disease Electronically Signed   By: Virgina Norfolk M.D.   On: 03/29/2021 17:37    Procedures Procedures   Medications Ordered in ED Medications  nitroGLYCERIN (NITROSTAT) SL tablet 0.4 mg (0.4 mg Sublingual Given 03/29/21 1640)  ondansetron (ZOFRAN-ODT) disintegrating tablet 4 mg (4 mg Oral Given 03/29/21 1713)  acetaminophen (TYLENOL) tablet 1,000 mg (1,000 mg Oral Given  03/29/21 1822)  fentaNYL (SUBLIMAZE) injection 25 mcg (25 mcg Intravenous Given 03/29/21 1955)  iohexol (OMNIPAQUE) 350 MG/ML injection 60 mL (60 mLs Intravenous Contrast Given 03/29/21 2101)    ED Course  I have reviewed the triage vital signs and the nursing notes.  Pertinent labs & imaging results that were available during my care of the patient were reviewed by me and considered in my medical decision making (see chart for details).    MDM Rules/Calculators/A&P                           Patient seen the emergency department for evaluation of chest pain.  Physical exam is largely unremarkable.  ECG with no ST elevations or depressions, no evidence of pericarditis, sinus tachycardia.  Laboratory evaluation unremarkable, D-dimer elevated to 4.05, high-sensitivity troponin negative with delta troponin negative.  CT PE obtained in the setting of elevated D-dimer which was reassuringly negative.  Patient was given nitroglycerin here in the emergency department which led to headache and vomiting which were treated with Zofran and Tylenol.  On reevaluation, patient's chest pain has improved.  Is 3 3.  Patient safe for discharge and was given strict return precautions of which she voiced understanding.  Patient then discharged. Final Clinical Impression(s) / ED Diagnoses Final diagnoses:  Chest tightness    Rx / DC Orders ED Discharge Orders     None        Mayli Covington, Debe Coder, MD 03/29/21 [phone]

## 2021-03-29 NOTE — ED Triage Notes (Signed)
Chest pain at work today. EMS on scene found her hyperventilating. EMS  gave '324mg'$  aspirin and 3 doses of nitro. She pulled out her IV that EMS started. Patient gets very upset when you tell her that she is having a panic attack

## 2021-03-29 NOTE — Telephone Encounter (Signed)
Called as rapid response to the infusion center. I was working in nearby pulmonary clinic. 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 Patient was last seen normal around 2:15-20pm. She stopped speaking, was holding her chest and complaining of chest pain. BP 164/115; 146/102. O2 100% RA. HR 115-122. Patient was given 0.63m nitro for chest pain at 2:41pm. BS 87. Ems arrived at 2:42pm.    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 And [place] over    SIGNATURE    Dr. MBrand Males M.D., F.C.C.P,  Pulmonary and Critical Care Medicine Staff Physician, CMendotaDirector - Interstitial Lung Disease  Program  Pulmonary [place] [id number] NPI Number:  NPI ##[id number] Pager: [phone] If no answer  -> Check AMION or Try 3[place] CityTelephone (clinical office): [phone] Telephone (research): [phone]  2:48 PM 03/29/2021    has a past medical history of Anxiety, Dyslipidemia, Fibromyalgia, Headache, Pericarditis, [place]izures ([place], Tachycardia, and TIA (transient ischemic attack).   reports that she has never smoked. She has never used smokeless tobacco.  Past Surgical History:  Procedure Laterality Date   ABDOMINAL HYSTERECTOMY     CHOLECYSTECTOMY, LAPAROSCOPIC      Allergies  Allergen Reactions   Codeine Hives and Rash   Morphine Hives and Other (See Comments)    Headache   Morphine And Related Hives   Oxycodone Hives   Chlorhexidine Hives and Rash         Immunization History  Administered Date(s) Administered   PFIZER(Purple Top)SARS-COV-2 Vaccination 10/31/2019, 11/24/2019    Family History  Problem Relation Age of Onset   Hypertension Mother    Hypertension Father    Heart  attack Father 577  Healthy Sister    Healthy Sister    Healthy Sister    Diabetes type II Brother    COPD Brother    Healthy Brother    Healthy Brother    Hypertension Maternal Grandmother    Diabetes Paternal Grandmother    Throat cancer Maternal Uncle    Colon cancer Neg Hx    Pancreatic cancer Neg Hx    Stomach cancer Neg Hx    Liver disease Neg Hx      Current Outpatient Medications:    acetaminophen (TYLENOL) 325 MG tablet, Take 650 mg by mouth every 6 (six) hours as needed for mild pain, fever or headache., Disp: , Rfl:    albuterol (VENTOLIN HFA) 108 (90 Base) MCG/ACT inhaler, INHALE 2 PUFFS INTO THE LUNGS EVERY 4 HOURS AS NEEDED FOR WHEEZING. (Patient taking differently: Inhale 2 puffs into the lungs every 4 (four) hours as needed for wheezing.), Disp: 8.5 g, Rfl: 0   ALPRAZolam (XANAX) 0.5 MG tablet, Take 1 tablet (0.5 mg total) by mouth 2 (two) times daily as needed for anxiety. (Patient taking differently: Take 0.5 mg by mouth See admin instructions. 0.542moral at bedtime And 0.1m84maily as needed for anxiety), Disp: 45 tablet, Rfl: 1   aspirin 81 MG EC tablet, Take 1 tablet (81 mg total) by mouth daily., Disp: 21 tablet, Rfl: 0   atorvastatin (LIPITOR) 40 MG tablet, Take 1 tablet (40 mg total) by mouth daily., Disp: 90 tablet, Rfl:  3   Calcium-Vitamin D-Vitamin K 650-12.5-40 MG-MCG-MCG CHEW, Chew 1 tablet by mouth daily., Disp: , Rfl:    clopidogrel (PLAVIX) 75 MG tablet, Take 1 tablet by mouth daily., Disp: 90 tablet, Rfl: 1   cyclobenzaprine (FLEXERIL) 10 MG tablet, Take 5-10 mg by mouth at bedtime as needed for muscle spasms., Disp: , Rfl:    DULoxetine (CYMBALTA) 60 MG capsule, Take 1 capsule (60 mg total) by mouth daily., Disp: 30 capsule, Rfl: 1   furosemide (LASIX) 20 MG tablet, Take 1 tablet by mouth daily as needed., Disp: 90 tablet, Rfl: 3   gabapentin (NEURONTIN) 300 MG capsule, Take 1 capsule (300 mg total) by mouth at bedtime., Disp: 90 capsule, Rfl: 2    Galcanezumab-gnlm (EMGALITY) 120 MG/ML SOSY, Inject 120 mg under the skin every 28 days, Disp: 1 mL, Rfl: 6   loratadine (CLARITIN) 10 MG tablet, Take 10 mg by mouth daily., Disp: , Rfl:    methocarbamol (ROBAXIN) 500 MG tablet, Take 1/2 to 1 tablet by mouth nightly at bedtime as needed (Patient not taking: Reported on 03/21/2021), Disp: 30 tablet, Rfl: 5   metoprolol succinate (TOPROL-XL) 25 MG 24 hr tablet, Take 1 tablet by mouth daily., Disp: 90 tablet, Rfl: 3   Multiple Vitamin (MULTIVITAMIN WITH MINERALS) TABS tablet, Take 1 tablet by mouth daily., Disp: , Rfl:    Na Sulfate-K Sulfate-Mg Sulf (SUPREP BOWEL PREP KIT) 17.5-3.13-1.6 GM/177ML SOLN, Take 1 kit by mouth as directed. For colonoscopy prep, Disp: 354 mL, Rfl: 0   OLANZapine-FLUoxetine (SYMBYAX) 6-25 MG capsule, Take 1 capsule by mouth at bedtime., Disp: 30 capsule, Rfl: 1   pantoprazole (PROTONIX) 40 MG tablet, Take 1 tablet by mouth daily., Disp: 30 tablet, Rfl: 11   Ubrogepant (UBRELVY) 100 MG TABS, TAKE  1/2 TABLET AT HEADACHE ONSET. MAY REPEAT DOSE IN TWO HOURS IF NO IMPROVEMENT. DO NOT EXCEED MORE THAN TWO TABLETS IN 24 HOURS. (Patient taking differently: Take 50-100 mg by mouth See admin instructions. Take 34m at headache onset, may repeat dose in two hours if no improvement. Do not excess more than 2046min 24 hours), Disp: 18 tablet, Rfl: 1   valACYclovir (VALTREX) 500 MG tablet, Take 1 tablet (500 mg total) by mouth daily., Disp: 90 tablet, Rfl: 1

## 2021-03-29 NOTE — Telephone Encounter (Signed)
Left a message for the patient to call back.  

## 2021-03-29 NOTE — ED Notes (Signed)
She is refusing to let us draw her second troponin

## 2021-03-29 NOTE — ED Notes (Signed)
ED Provider at bedside. 

## 2021-03-29 NOTE — ED Notes (Signed)
Patient transported to CT 

## 2021-03-29 NOTE — ED Notes (Signed)
Received report from off going RN. Reporting altered mental status. Secretary reports reports unwitnessed fall in room, pt found on ground. Staff assisted pt back to bed.

## 2021-03-29 NOTE — Telephone Encounter (Signed)
[employer] call to say that the patient is returning the device after wearing it for only 24 hrs and brook out in a rash. Once the patient took off the device some of her skin came off  Ticket [phone]

## 2021-03-29 NOTE — ED Notes (Addendum)
MD Kommor notified of orientation/AMS concerns and return of chest pain, MD aware of fall.

## 2021-03-29 NOTE — ED Notes (Signed)
Patient fell out of the bed trying to get in touch with the nurse. Patient denies injury.

## 2021-03-30 ENCOUNTER — Ambulatory Visit: Payer: Self-pay | Admitting: Nurse Practitioner

## 2021-03-30 ENCOUNTER — Other Ambulatory Visit: Payer: Self-pay | Admitting: *Deleted

## 2021-03-30 ENCOUNTER — Telehealth: Payer: Self-pay | Admitting: *Deleted

## 2021-03-30 ENCOUNTER — Ambulatory Visit: Payer: 59 | Admitting: Physical Therapy

## 2021-03-30 ENCOUNTER — Ambulatory Visit (INDEPENDENT_AMBULATORY_CARE_PROVIDER_SITE_OTHER): Payer: 59

## 2021-03-30 DIAGNOSIS — R002 Palpitations: Secondary | ICD-10-CM

## 2021-03-30 NOTE — Telephone Encounter (Signed)
Patient has been seen in the ER 

## 2021-03-30 NOTE — Progress Notes (Unsigned)
Patient enrolled for [employer] to ship a 14 day long term monitor with a bridge and 29M repositionable electrodes to her home. Patient had allergic reaction to ZIO patch.

## 2021-03-30 NOTE — Telephone Encounter (Signed)
Patient had allergic reaction to 14 day ZIO XT patch and had to remove in less than 24 hours because it was burning. [employer] contacted to cancel monitor. Patient will be enrolled for [employer] to ship a 14 day long term monitor to her home with the sensitive skin setup of a bridge and 75M repositionable electrodes.

## 2021-03-31 NOTE — Telephone Encounter (Signed)
Recd clearance back from Melrose Nakayama PA -okay to hold Plavix for 5 days prior to procedure. Pt has been informed and voiced understanding.Letter has been sent to be scanned in.

## 2021-04-04 ENCOUNTER — Ambulatory Visit: Payer: 59 | Admitting: Physical Therapy

## 2021-04-04 ENCOUNTER — Ambulatory Visit: Payer: 59 | Admitting: General Practice

## 2021-04-04 ENCOUNTER — Telehealth: Payer: Self-pay | Admitting: Cardiology

## 2021-04-04 DIAGNOSIS — Z20822 Contact with and (suspected) exposure to covid-19: Secondary | ICD-10-CM | POA: Diagnosis not present

## 2021-04-04 DIAGNOSIS — Z8679 Personal history of other diseases of the circulatory system: Secondary | ICD-10-CM | POA: Diagnosis not present

## 2021-04-04 DIAGNOSIS — I517 Cardiomegaly: Secondary | ICD-10-CM | POA: Diagnosis not present

## 2021-04-04 DIAGNOSIS — Z8669 Personal history of other diseases of the nervous system and sense organs: Secondary | ICD-10-CM | POA: Diagnosis not present

## 2021-04-04 DIAGNOSIS — R079 Chest pain, unspecified: Secondary | ICD-10-CM | POA: Diagnosis not present

## 2021-04-04 DIAGNOSIS — I10 Essential (primary) hypertension: Secondary | ICD-10-CM | POA: Diagnosis not present

## 2021-04-04 DIAGNOSIS — R0789 Other chest pain: Secondary | ICD-10-CM | POA: Diagnosis not present

## 2021-04-04 DIAGNOSIS — K219 Gastro-esophageal reflux disease without esophagitis: Secondary | ICD-10-CM | POA: Diagnosis not present

## 2021-04-04 DIAGNOSIS — F419 Anxiety disorder, unspecified: Secondary | ICD-10-CM | POA: Diagnosis not present

## 2021-04-04 DIAGNOSIS — Z8673 Personal history of transient ischemic attack (TIA), and cerebral infarction without residual deficits: Secondary | ICD-10-CM | POA: Diagnosis not present

## 2021-04-04 DIAGNOSIS — I340 Nonrheumatic mitral (valve) insufficiency: Secondary | ICD-10-CM | POA: Diagnosis not present

## 2021-04-04 DIAGNOSIS — G629 Polyneuropathy, unspecified: Secondary | ICD-10-CM | POA: Diagnosis not present

## 2021-04-04 NOTE — Telephone Encounter (Signed)
Pt c/o of Chest Pain: STAT if CP now or developed within 24 hours  1. Are you having CP right now? yes  2. Are you experiencing any other symptoms (ex. SOB, nausea, vomiting, sweating)? SOB when moving around  3. How long have you been experiencing CP? Since Wednesday, went to the ED  4. Is your CP continuous or coming and going? Continuous and getting worse  5. Have you taken Nitroglycerin? Yes at the ED   Patient states she has been having chest tightness since Wednesday and did go to the ED for it. She says they gave her nitroglycerin while she was there but she does not have any.  She says she gets SOB when moving around and can't lay down or she can't breathe.  ?

## 2021-04-04 NOTE — Telephone Encounter (Addendum)
Spoke with pt and is still continuing to have chest pain and also notes SOB O2 dropping with walking into the 80's and notes SOB when lying down Per pt had these same symptoms before when was dx with pericarditis Pt also notes HR creeping up to 120 Metoprolol has been recently decreased to 25 mg Pt is to wear a monitor this is still pending results Pt is awaiting another monitor due to having reaction to adhesive on previous device Appt made with Coletta Memos NP today at 10:45 am .Adonis Housekeeper

## 2021-04-04 NOTE — Progress Notes (Deleted)
Cardiology Clinic Note   Patient Name: Desiree Russell Date of Encounter: 04/04/2021  Primary Care Provider:  Vevelyn Francois, NP Primary Cardiologist:  Minus Breeding, MD  Patient Profile    ***  Past Medical History    Past Medical History:  Diagnosis Date   Anxiety    Dyslipidemia    Fibromyalgia    Headache    Pericarditis    Seizures (HCC)    Tachycardia    TIA (transient ischemic attack)    Recurrent   Past Surgical History:  Procedure Laterality Date   ABDOMINAL HYSTERECTOMY     CHOLECYSTECTOMY, LAPAROSCOPIC      Allergies  Allergies  Allergen Reactions   Codeine Hives and Rash   Morphine Hives and Other (See Comments)    Headaches, also   Morphine And Related Hives   Oxycodone Hives   Chlorhexidine Hives and Rash         History of Present Illness    ***  Home Medications    Prior to Admission medications   Medication Sig Start Date End Date Taking? Authorizing Provider  acetaminophen (TYLENOL) 325 MG tablet Take 650 mg by mouth every 6 (six) hours as needed for mild pain, fever or headache.    [provider]  albuterol (VENTOLIN HFA) 108 (90 Base) MCG/ACT inhaler INHALE 2 PUFFS INTO THE LUNGS EVERY 4 HOURS AS NEEDED FOR WHEEZING. Patient taking differently: Inhale 2 puffs into the lungs every 4 (four) hours as needed for wheezing. 10/20/20 10/20/21  Fenton Foy, NP  ALPRAZolam Duanne Moron) 0.5 MG tablet Take 1 tablet (0.5 mg total) by mouth 2 (two) times daily as needed for anxiety. Patient taking differently: Take 0.5 mg by mouth See admin instructions. 0.31m oral at bedtime And 0.528mdaily as needed for anxiety 02/14/21 02/14/22  Arfeen, SyArlyce HarmanMD  aspirin 81 MG EC tablet Take 1 tablet (81 mg total) by mouth daily. 01/23/21   Mikhail, MaVelta AddisonDO  atorvastatin (LIPITOR) 40 MG tablet Take 1 tablet (40 mg total) by mouth daily. 03/27/21   HoMinus BreedingMD  Calcium-Vitamin D-Vitamin K 650-12.5-40 MG-MCG-MCG CHEW Chew 1 tablet by mouth  daily.    [provider]  clopidogrel (PLAVIX) 75 MG tablet Take 1 tablet by mouth daily. 03/25/21     cyclobenzaprine (FLEXERIL) 10 MG tablet Take 5-10 mg by mouth at bedtime as needed for muscle spasms. 02/28/21   [provider]  DULoxetine (CYMBALTA) 60 MG capsule Take 1 capsule (60 mg total) by mouth daily. 02/14/21 04/02/21  Arfeen, SyArlyce HarmanMD  furosemide (LASIX) 20 MG tablet Take 1 tablet by mouth daily as needed. 03/21/21 06/19/21  HoMinus BreedingMD  gabapentin (NEURONTIN) 300 MG capsule Take 1 capsule (300 mg total) by mouth at bedtime. 02/22/21 02/22/22  KiVevelyn FrancoisNP  Galcanezumab-gnlm ([facility] MG/ML SOSY Inject 120 mg under the skin every 28 days 02/15/21     loratadine (CLARITIN) 10 MG tablet Take 10 mg by mouth daily.    [provider]  methocarbamol (ROBAXIN) 500 MG tablet Take 1/2 to 1 tablet by mouth nightly at bedtime as needed 02/24/21     metoprolol succinate (TOPROL-XL) 25 MG 24 hr tablet Take 1 tablet by mouth daily. 03/21/21 03/21/22  HoMinus BreedingMD  Multiple Vitamin (MULTIVITAMIN WITH MINERALS) TABS tablet Take 1 tablet by mouth daily.    [provider]  Na Sulfate-K Sulfate-Mg Sulf (SUPREP BOWEL PREP KIT) 17.5-3.13-1.6 GM/177ML SOLN Take 1 kit by mouth  as directed. For colonoscopy prep 03/21/21   Jackquline Denmark, MD  OLANZapine-FLUoxetine [facility]) 6-25 MG capsule Take 1 capsule by mouth at bedtime. 02/14/21   Arfeen, Arlyce Harman, MD  pantoprazole (PROTONIX) 40 MG tablet Take 1 tablet by mouth daily. 03/21/21   Jackquline Denmark, MD  Ubrogepant (UBRELVY) 100 MG TABS TAKE  1/2 TABLET AT HEADACHE ONSET. MAY REPEAT DOSE IN TWO HOURS IF NO IMPROVEMENT. DO NOT EXCEED MORE THAN TWO TABLETS IN 24 HOURS. Patient taking differently: Take 50-100 mg by mouth See admin instructions. Take 54m at headache onset, may repeat dose in two hours if no improvement. Do not excess more than 2067min 24 hours 02/15/21     valACYclovir (VALTREX) 500 MG tablet Take 1  tablet (500 mg total) by mouth daily. 08/02/20       Family History    Family History  Problem Relation Age of Onset   Hypertension Mother    Hypertension Father    Heart attack Father [id number] Healthy Sister    Healthy Sister    Healthy Sister    Diabetes type II Brother    COPD Brother    Healthy Brother    Healthy Brother    Hypertension Maternal Grandmother    Diabetes Paternal Grandmother    Throat cancer Maternal Uncle    Colon cancer Neg Hx    Pancreatic cancer Neg Hx    Stomach cancer Neg Hx    Liver disease Neg Hx    She indicated that her mother is alive. She indicated that her father is alive. She indicated that all of her three sisters are alive. She indicated that all of her four brothers are alive. She indicated that her maternal grandmother is alive. She indicated that her maternal grandfather is deceased. She indicated that her paternal grandmother is deceased. She indicated that her paternal grandfather is deceased. She indicated that her maternal uncle is deceased. She indicated that the status of her neg hx is unknown.  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     Social Determinants of Health   Financial Resource Strain: Not on file  Food Insecurity: Not on file  Transportation Needs: Not on file  Physical Activity: Not on file  Stress: Not on file  Social Connections: Not on file  Intimate Partner Violence: Not on file     Review of  Systems    General:  No chills, fever, night sweats or weight changes.  Cardiovascular:  No chest pain, dyspnea on exertion, edema, orthopnea, palpitations, paroxysmal nocturnal dyspnea. Dermatological: No rash, lesions/masses Respiratory: No cough, dyspnea Urologic: No hematuria, dysuria Abdominal:   No nausea, vomiting, diarrhea, bright red blood per rectum, melena, or hematemesis Neurologic:  No visual changes, wkns, changes in mental status. All other systems reviewed and are otherwise negative except  as noted above.  Physical Exam    VS:  There were no vitals taken for this visit. , BMI There is no height or weight on file to calculate BMI. GEN: Well nourished, well developed, in no acute distress. HEENT: normal. Neck: Supple, no JVD, carotid bruits, or masses. Cardiac: RRR, no murmurs, rubs, or gallops. No clubbing, cyanosis, edema.  Radials/DP/PT 2+ and equal bilaterally.  Respiratory:  Respirations regular and unlabored, clear to auscultation bilaterally. GI: Soft, nontender, nondistended, BS + x 4. MS: no deformity or atrophy. Skin: warm and dry, no rash. Neuro:  Strength and sensation are intact. Psych: Normal affect.  Accessory Clinical Findings    Recent Labs: 01/21/2021: Magnesium 1.8; TSH 0.520 03/29/2021: ALT 17; B Natriuretic Peptide 8.4; BUN 15; Creatinine, Ser 0.91; Hemoglobin 12.6; Platelets 200; Potassium 3.9; Sodium 136   Recent Lipid Panel    Component Value Date/Time   CHOL 175 01/21/2021 0156   CHOL 243 (H) 10/27/2020 0934   TRIG 127 01/21/2021 0156   HDL 40 (L) 01/21/2021 0156   HDL 58 10/27/2020 0934   CHOLHDL 4.4 01/21/2021 0156   VLDL 25 01/21/2021 0156   LDLCALC 110 (H) 01/21/2021 0156   LDLCALC 171 (H) 10/27/2020 0934    ECG personally reviewed by me today- *** - No acute changes  Assessment & Plan   1.  ***   Jossie Ng. Aundreya Souffrant NP-C    04/04/2021, 8:29 AM [address] Office  [phone] Fax [phone]  Notice: This dictation was prepared with Dragon dictation along with smaller phrase technology. Any transcriptional errors that result from this process are unintentional and may not be corrected upon review.  I spent***minutes examining this patient, reviewing medications, and using patient centered shared decision making involving her cardiac care.  Prior to her visit I spent greater than 20 minutes reviewing her past medical history,  medications, and prior cardiac tests.

## 2021-04-04 NOTE — Telephone Encounter (Signed)
D/w Coletta Memos, FNP-C-unable to evaluate in office must go to the ER.  Pt notified

## 2021-04-05 DIAGNOSIS — E785 Hyperlipidemia, unspecified: Secondary | ICD-10-CM | POA: Diagnosis not present

## 2021-04-05 DIAGNOSIS — Z8679 Personal history of other diseases of the circulatory system: Secondary | ICD-10-CM | POA: Diagnosis not present

## 2021-04-05 DIAGNOSIS — Z20822 Contact with and (suspected) exposure to covid-19: Secondary | ICD-10-CM | POA: Diagnosis not present

## 2021-04-05 DIAGNOSIS — G629 Polyneuropathy, unspecified: Secondary | ICD-10-CM | POA: Diagnosis not present

## 2021-04-05 DIAGNOSIS — R079 Chest pain, unspecified: Secondary | ICD-10-CM | POA: Diagnosis not present

## 2021-04-05 DIAGNOSIS — Z8669 Personal history of other diseases of the nervous system and sense organs: Secondary | ICD-10-CM | POA: Diagnosis not present

## 2021-04-05 DIAGNOSIS — K219 Gastro-esophageal reflux disease without esophagitis: Secondary | ICD-10-CM | POA: Diagnosis not present

## 2021-04-05 DIAGNOSIS — J45909 Unspecified asthma, uncomplicated: Secondary | ICD-10-CM | POA: Diagnosis not present

## 2021-04-05 DIAGNOSIS — I340 Nonrheumatic mitral (valve) insufficiency: Secondary | ICD-10-CM | POA: Diagnosis not present

## 2021-04-05 DIAGNOSIS — R002 Palpitations: Secondary | ICD-10-CM | POA: Diagnosis not present

## 2021-04-05 DIAGNOSIS — F419 Anxiety disorder, unspecified: Secondary | ICD-10-CM | POA: Diagnosis not present

## 2021-04-05 DIAGNOSIS — Z8673 Personal history of transient ischemic attack (TIA), and cerebral infarction without residual deficits: Secondary | ICD-10-CM | POA: Diagnosis not present

## 2021-04-05 DIAGNOSIS — I10 Essential (primary) hypertension: Secondary | ICD-10-CM | POA: Diagnosis not present

## 2021-04-05 NOTE — Telephone Encounter (Signed)
Patient is following up regarding her heart monitor. She would like to know if she needs to continue to wear it.  She states she had multiple tests while admitted in the hospital and she would like to know if the monitor is still necessary. Please advise.

## 2021-04-05 NOTE — Telephone Encounter (Signed)
Called patient, advised that we would recommend to do the monitor just to see what that did show- patient verbalized understanding, and will do this. She went to [facility]- and requested they send over the information. Will await for any other updates.

## 2021-04-06 ENCOUNTER — Ambulatory Visit (HOSPITAL_COMMUNITY): Payer: 59 | Admitting: Clinical

## 2021-04-06 ENCOUNTER — Ambulatory Visit: Payer: 59 | Admitting: Physical Therapy

## 2021-04-10 ENCOUNTER — Other Ambulatory Visit (HOSPITAL_COMMUNITY): Payer: Self-pay

## 2021-04-10 DIAGNOSIS — M48062 Spinal stenosis, lumbar region with neurogenic claudication: Secondary | ICD-10-CM | POA: Diagnosis not present

## 2021-04-10 DIAGNOSIS — M5416 Radiculopathy, lumbar region: Secondary | ICD-10-CM | POA: Diagnosis not present

## 2021-04-10 DIAGNOSIS — M5136 Other intervertebral disc degeneration, lumbar region: Secondary | ICD-10-CM | POA: Diagnosis not present

## 2021-04-10 DIAGNOSIS — M5126 Other intervertebral disc displacement, lumbar region: Secondary | ICD-10-CM | POA: Diagnosis not present

## 2021-04-10 MED ORDER — HYDROCODONE-ACETAMINOPHEN 5-325 MG PO TABS
ORAL_TABLET | ORAL | 0 refills | Status: DC
  Filled 2021-04-10: qty 14, 7d supply, fill #0

## 2021-04-11 ENCOUNTER — Telehealth (HOSPITAL_COMMUNITY): Payer: Self-pay

## 2021-04-11 ENCOUNTER — Ambulatory Visit: Payer: 59 | Admitting: Physical Therapy

## 2021-04-11 ENCOUNTER — Encounter: Payer: Self-pay | Admitting: Nurse Practitioner

## 2021-04-11 ENCOUNTER — Ambulatory Visit (INDEPENDENT_AMBULATORY_CARE_PROVIDER_SITE_OTHER): Payer: 59 | Admitting: Nurse Practitioner

## 2021-04-11 ENCOUNTER — Other Ambulatory Visit (HOSPITAL_COMMUNITY): Payer: Self-pay

## 2021-04-11 ENCOUNTER — Other Ambulatory Visit: Payer: Self-pay

## 2021-04-11 VITALS — BP 140/92 | HR 91 | Temp 97.4°F | Ht 64.0 in | Wt 220.0 lb

## 2021-04-11 DIAGNOSIS — Z8679 Personal history of other diseases of the circulatory system: Secondary | ICD-10-CM

## 2021-04-11 DIAGNOSIS — G4489 Other headache syndrome: Secondary | ICD-10-CM | POA: Diagnosis not present

## 2021-04-11 DIAGNOSIS — I639 Cerebral infarction, unspecified: Secondary | ICD-10-CM

## 2021-04-11 DIAGNOSIS — I313 Pericardial effusion (noninflammatory): Secondary | ICD-10-CM

## 2021-04-11 DIAGNOSIS — I739 Peripheral vascular disease, unspecified: Secondary | ICD-10-CM | POA: Diagnosis not present

## 2021-04-11 DIAGNOSIS — R0789 Other chest pain: Secondary | ICD-10-CM | POA: Diagnosis not present

## 2021-04-11 DIAGNOSIS — E876 Hypokalemia: Secondary | ICD-10-CM | POA: Diagnosis not present

## 2021-04-11 DIAGNOSIS — E785 Hyperlipidemia, unspecified: Secondary | ICD-10-CM | POA: Diagnosis not present

## 2021-04-11 DIAGNOSIS — G459 Transient cerebral ischemic attack, unspecified: Secondary | ICD-10-CM | POA: Diagnosis not present

## 2021-04-11 DIAGNOSIS — R29818 Other symptoms and signs involving the nervous system: Secondary | ICD-10-CM | POA: Diagnosis not present

## 2021-04-11 DIAGNOSIS — Z8673 Personal history of transient ischemic attack (TIA), and cerebral infarction without residual deficits: Secondary | ICD-10-CM | POA: Diagnosis not present

## 2021-04-11 DIAGNOSIS — F419 Anxiety disorder, unspecified: Secondary | ICD-10-CM | POA: Diagnosis not present

## 2021-04-11 DIAGNOSIS — R29810 Facial weakness: Secondary | ICD-10-CM | POA: Diagnosis not present

## 2021-04-11 DIAGNOSIS — G43909 Migraine, unspecified, not intractable, without status migrainosus: Secondary | ICD-10-CM | POA: Diagnosis not present

## 2021-04-11 DIAGNOSIS — I10 Essential (primary) hypertension: Secondary | ICD-10-CM | POA: Diagnosis not present

## 2021-04-11 DIAGNOSIS — Z7902 Long term (current) use of antithrombotics/antiplatelets: Secondary | ICD-10-CM | POA: Diagnosis not present

## 2021-04-11 DIAGNOSIS — I3139 Other pericardial effusion (noninflammatory): Secondary | ICD-10-CM

## 2021-04-11 DIAGNOSIS — Z79899 Other long term (current) drug therapy: Secondary | ICD-10-CM | POA: Diagnosis not present

## 2021-04-11 DIAGNOSIS — F32A Depression, unspecified: Secondary | ICD-10-CM | POA: Diagnosis not present

## 2021-04-11 DIAGNOSIS — R531 Weakness: Secondary | ICD-10-CM | POA: Diagnosis not present

## 2021-04-11 DIAGNOSIS — R000 Tachycardia, unspecified: Secondary | ICD-10-CM | POA: Diagnosis not present

## 2021-04-11 NOTE — Telephone Encounter (Signed)
Transitions of Care Pharmacy  ° °Call attempted for a pharmacy transitions of care follow-up. HIPAA appropriate voicemail was left with call back information provided.  ° °Call attempt #1. Will follow-up in 2-3 days.  °  °

## 2021-04-11 NOTE — Progress Notes (Signed)
New Patient Office Visit  Subjective:  Patient ID: Desiree Russell, female    DOB: 05/04/1971  Age: 50 y.o. MRN: [id number]  CC:  Chief Complaint  Patient presents with   Hospitalization Follow-up    HPI Desiree Russell presents for hospital follow-up and to establish care with a primary care provider.This is her initial visit to the office.  Follow up Hospitalization  Patient was admitted to [facility] on 04/04/21 and discharged on 09.07/22. She was treated for dyspnea and tachycardia. Treatment for this included cardiac monitoring. She reports excellent compliance with treatment. She reports this condition is stayed the same.     Past Medical History:  Diagnosis Date   Anxiety    Dyslipidemia    Fibromyalgia    Headache    Pericarditis    Seizures (HCC)    Tachycardia    TIA (transient ischemic attack)    Recurrent    Past Surgical History:  Procedure Laterality Date   ABDOMINAL HYSTERECTOMY     CHOLECYSTECTOMY, LAPAROSCOPIC      Family History  Problem Relation Age of Onset   Hypertension Mother    Hypertension Father    Heart attack Father 36   Healthy Sister    Healthy Sister    Healthy Sister    Diabetes type II Brother    COPD Brother    Healthy Brother    Healthy Brother    Hypertension Maternal Grandmother    Diabetes Paternal Grandmother    Throat cancer Maternal Uncle    Colon cancer Neg Hx    Pancreatic cancer Neg Hx    Stomach cancer Neg Hx    Liver disease Neg Hx     Social History   Socioeconomic History   Marital status: Married    Spouse name: Not on file   Number of children: Not on file   Years of education: Not on file   Highest education level: Not on file  Occupational History   Not on file  Tobacco Use   Smoking status: Never   Smokeless tobacco: Never  Substance and Sexual Activity   Alcohol use: Yes    Comment: occ    Drug use: No   Sexual activity: Not on file  Other Topics Concern   Not on file  Social  History Narrative   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     Social Determinants of Health   Financial Resource Strain: Not on file  Food Insecurity: Not on file  Transportation Needs: Not on file  Physical Activity: Not on file  Stress: Not on file  Social Connections: Not on file  Intimate Partner Violence: Not on file    ROS Review of Systems  Constitutional:  Negative for appetite change, fatigue and fever.  HENT:  Negative for congestion, ear pain, sinus pressure and sore throat.   Eyes:  Negative for pain.  Respiratory:  Positive for chest tightness and shortness of breath (When getting up and moving around.). Negative for cough and wheezing.   Cardiovascular:  Negative for chest pain and palpitations.  Gastrointestinal:  Negative for abdominal pain, constipation, diarrhea, nausea and vomiting.  Genitourinary:  Negative for dysuria and hematuria.  Musculoskeletal:  Negative for arthralgias, back pain, joint swelling and myalgias.  Skin:  Negative for rash.  Neurological:  Negative for dizziness, weakness and headaches.  Psychiatric/Behavioral:  Negative for dysphoric mood. The patient is not nervous/anxious.    Objective:   Today's Vitals: BP Marland Kitchen)  140/92 (BP Location: Left Arm, Patient Position: Sitting)   Pulse 91   Temp (!) 97.4 F (36.3 C) (Temporal)   Ht _0  (1.626 m)   Wt 220 lb (99.8 kg)   SpO2 98%   BMI 37.76 kg/m   Physical Exam Vitals reviewed.  Constitutional:      Appearance: Normal appearance.  HENT:     Right Ear: Tympanic membrane, ear canal and external ear normal.     Left Ear: Tympanic membrane, ear canal and external ear normal.     Nose: Nose normal.     Mouth/Throat:     Mouth: Mucous membranes are moist.  Cardiovascular:     Rate and Rhythm: Normal rate and regular rhythm.     Pulses: Normal pulses.     Heart sounds: Normal heart sounds.  Pulmonary:     Effort: Pulmonary effort is normal.     Breath sounds:  Normal breath sounds.  Abdominal:     Palpations: Abdomen is soft.  Musculoskeletal:        General: Normal range of motion.     Cervical back: Normal range of motion.  Skin:    General: Skin is warm and dry.  Neurological:     Mental Status: She is alert and oriented to person, place, and time.  Psychiatric:        Mood and Affect: Mood normal.        Behavior: Behavior normal.        Thought Content: Thought content normal.        Judgment: Judgment normal.    Assessment & Plan:   1. Cerebrovascular accident (CVA), unspecified mechanism ([place])  2. Small vessel disease ([place])  3. History of pericarditis  4. Pericardial effusion   At 2:23 during office visit pt stated "I don't feel right" BP 1332/100, pulse 105, O2 sat 99%. Pt transferred to exam table with assistance. Pt c/o headache, right side weakness. Fasciculation noted to lower facial muscles. Pt became aphasic. Able to follow commands initially. 911 called. Pt transported via EMS to [facility].   Telephone report called to Macie Burows, Therapist, sports at [facility] at 3:00. Imaging records from [facility] faxed to [facility] ED  Outpatient Encounter Medications as of 04/11/2021  Medication Sig   acetaminophen (TYLENOL) 325 MG tablet Take 650 mg by mouth every 6 (six) hours as needed for mild pain, fever or headache.   albuterol (VENTOLIN HFA) 108 (90 Base) MCG/ACT inhaler INHALE 2 PUFFS INTO THE LUNGS EVERY 4 HOURS AS NEEDED FOR WHEEZING. (Patient taking differently: Inhale 2 puffs into the lungs every 4 (four) hours as needed for wheezing.)   ALPRAZolam (XANAX) 0.5 MG tablet Take 1 tablet (0.5 mg total) by mouth 2 (two) times daily as needed for anxiety. (Patient taking differently: Take 0.5 mg by mouth See admin instructions. 0.34m oral at bedtime And 0.561mdaily as needed for anxiety)   aspirin 81 MG EC tablet Take 1 tablet (81 mg total) by mouth daily.   atorvastatin (LIPITOR) 40 MG tablet Take 1 tablet (40 mg total) by mouth  daily.   Calcium-Vitamin D-Vitamin K 650-12.5-40 MG-MCG-MCG CHEW Chew 1 tablet by mouth daily.   clopidogrel (PLAVIX) 75 MG tablet Take 1 tablet by mouth daily.   cyclobenzaprine (FLEXERIL) 10 MG tablet Take 5-10 mg by mouth at bedtime as needed for muscle spasms.   DULoxetine (CYMBALTA) 60 MG capsule Take 1 capsule (60 mg total) by mouth daily.   furosemide (LASIX) 20 MG tablet  Take 1 tablet by mouth daily as needed.   gabapentin (NEURONTIN) 300 MG capsule Take 1 capsule (300 mg total) by mouth at bedtime. (Patient taking differently: Take 300 mg by mouth 2 (two) times daily.)   Galcanezumab-gnlm (EMGALITY) 120 MG/ML SOSY Inject 120 mg under the skin every 28 days   HYDROcodone-acetaminophen (NORCO/VICODIN) 5-325 MG tablet Take 1 tablet by mouth 2 (two) times daily as needed   loratadine (CLARITIN) 10 MG tablet Take 10 mg by mouth daily.   metoprolol succinate (TOPROL-XL) 25 MG 24 hr tablet Take 1 tablet by mouth daily.   Multiple Vitamin (MULTIVITAMIN WITH MINERALS) TABS tablet Take 1 tablet by mouth daily.   Na Sulfate-K Sulfate-Mg Sulf (SUPREP BOWEL PREP KIT) 17.5-3.13-1.6 GM/177ML SOLN Take 1 kit by mouth as directed. For colonoscopy prep   OLANZapine-FLUoxetine (SYMBYAX) 6-25 MG capsule Take 1 capsule by mouth at bedtime.   pantoprazole (PROTONIX) 40 MG tablet Take 1 tablet by mouth daily.   senna-docusate (SENNA-TIME S) 8.6-50 MG tablet Take 2 tablets by mouth daily.   Ubrogepant (UBRELVY) 100 MG TABS TAKE  1/2 TABLET AT HEADACHE ONSET. MAY REPEAT DOSE IN TWO HOURS IF NO IMPROVEMENT. DO NOT EXCEED MORE THAN TWO TABLETS IN 24 HOURS. (Patient taking differently: Take 50-100 mg by mouth See admin instructions. Take 3m at headache onset, may repeat dose in two hours if no improvement. Do not excess more than 2075min 24 hours)   valACYclovir (VALTREX) 500 MG tablet Take 1 tablet (500 mg total) by mouth daily.   [DISCONTINUED] methocarbamol (ROBAXIN) 500 MG tablet Take 1/2 to 1 tablet by mouth  nightly at bedtime as needed   No facility-administered encounter medications on file as of 04/11/2021.    Follow-up: pending hospital course

## 2021-04-11 NOTE — Addendum Note (Signed)
Addended by: Oleta Mouse on: 04/11/2021 04:40 PM   Modules accepted: Orders

## 2021-04-12 DIAGNOSIS — Z8673 Personal history of transient ischemic attack (TIA), and cerebral infarction without residual deficits: Secondary | ICD-10-CM | POA: Diagnosis not present

## 2021-04-12 DIAGNOSIS — Z79899 Other long term (current) drug therapy: Secondary | ICD-10-CM | POA: Diagnosis not present

## 2021-04-12 DIAGNOSIS — F32A Depression, unspecified: Secondary | ICD-10-CM | POA: Diagnosis not present

## 2021-04-12 DIAGNOSIS — E785 Hyperlipidemia, unspecified: Secondary | ICD-10-CM | POA: Diagnosis not present

## 2021-04-12 DIAGNOSIS — R0789 Other chest pain: Secondary | ICD-10-CM | POA: Diagnosis not present

## 2021-04-12 DIAGNOSIS — R4701 Aphasia: Secondary | ICD-10-CM | POA: Diagnosis not present

## 2021-04-12 DIAGNOSIS — Z7902 Long term (current) use of antithrombotics/antiplatelets: Secondary | ICD-10-CM | POA: Diagnosis not present

## 2021-04-12 DIAGNOSIS — R000 Tachycardia, unspecified: Secondary | ICD-10-CM | POA: Diagnosis not present

## 2021-04-12 DIAGNOSIS — R29898 Other symptoms and signs involving the musculoskeletal system: Secondary | ICD-10-CM | POA: Diagnosis not present

## 2021-04-12 DIAGNOSIS — R531 Weakness: Secondary | ICD-10-CM | POA: Diagnosis not present

## 2021-04-12 DIAGNOSIS — I639 Cerebral infarction, unspecified: Secondary | ICD-10-CM | POA: Diagnosis not present

## 2021-04-12 DIAGNOSIS — F419 Anxiety disorder, unspecified: Secondary | ICD-10-CM | POA: Diagnosis not present

## 2021-04-13 ENCOUNTER — Encounter: Payer: Self-pay | Admitting: Nurse Practitioner

## 2021-04-13 ENCOUNTER — Other Ambulatory Visit: Payer: Self-pay

## 2021-04-13 ENCOUNTER — Ambulatory Visit (INDEPENDENT_AMBULATORY_CARE_PROVIDER_SITE_OTHER): Payer: 59 | Admitting: Nurse Practitioner

## 2021-04-13 ENCOUNTER — Other Ambulatory Visit: Payer: Self-pay | Admitting: Nurse Practitioner

## 2021-04-13 ENCOUNTER — Other Ambulatory Visit (HOSPITAL_COMMUNITY): Payer: Self-pay

## 2021-04-13 ENCOUNTER — Ambulatory Visit: Payer: 59 | Admitting: Physical Therapy

## 2021-04-13 VITALS — BP 122/82 | HR 88 | Temp 97.4°F | Ht 64.0 in | Wt 225.0 lb

## 2021-04-13 DIAGNOSIS — I639 Cerebral infarction, unspecified: Secondary | ICD-10-CM

## 2021-04-13 DIAGNOSIS — U099 Post covid-19 condition, unspecified: Secondary | ICD-10-CM

## 2021-04-13 DIAGNOSIS — G619 Inflammatory polyneuropathy, unspecified: Secondary | ICD-10-CM

## 2021-04-13 DIAGNOSIS — R0609 Other forms of dyspnea: Secondary | ICD-10-CM

## 2021-04-13 DIAGNOSIS — R0600 Dyspnea, unspecified: Secondary | ICD-10-CM

## 2021-04-13 DIAGNOSIS — R7989 Other specified abnormal findings of blood chemistry: Secondary | ICD-10-CM

## 2021-04-13 MED ORDER — GABAPENTIN 600 MG PO TABS
600.0000 mg | ORAL_TABLET | Freq: Every day | ORAL | 1 refills | Status: DC
Start: 2021-04-13 — End: 2021-10-10
  Filled 2021-04-13: qty 90, 90d supply, fill #0
  Filled 2021-07-10: qty 90, 90d supply, fill #1

## 2021-04-13 NOTE — Progress Notes (Signed)
Subjective:  Patient ID: Desiree Russell, female    DOB: Jun 05, 1971  Age: 50 y.o. MRN: [id number]  Chief Complaint  Patient presents with   Hospitalization Follow-up   HPI Desiree Russell is a 50 year old African-American female that presents for follow-up after hospitalization. She was sent to [facility] via EMS on 04/11/21 from the office during her initial visit. While discussing her health history, Desiree Russell began experiencing stroke-like symptoms. She became aphasic and was unable to follow commands. Today in-office she states she is experiencing increased numbness and burning to right leg. Desiree Russell tells me she had COVID-17 Aug 2020. States she is experiencing dyspnea with exertion since that time. She denies previous referral to pulmonology or recent sleep study. She has a past medical history of CVA x 2, TIAs, small vessel disease, and has recently experienced dyspnea with exertion. She is followed by Dr Lyndel Pleasure, neurology. States she is scheduled to see him the end of next month.   Follow up Hospitalization  Patient was admitted to [facility] on 04/11/2021 and discharged on 04/12/2021. She was treated for Stroke symptoms which had started at our office here at [facility]. Patient was having her initial visit to the office. At 2:23 during office visit pt stated "I don't feel right" BP 132/100, pulse 105, O2 sat 99%. Pt transferred to exam table with assistance. Pt c/o headache, right side weakness. Fasciculation noted to lower facial muscles. Pt became aphasic. Able to follow commands initially. 911 called. Pt transported via EMS to [facility]. Treatment for this included monitoring. She reports excellent compliance with treatment. She reports this condition is improved.    Current Outpatient Medications on File Prior to Visit  Medication Sig Dispense Refill   acetaminophen (TYLENOL) 325 MG tablet Take 650 mg by mouth every 6 (six) hours as needed for mild pain, fever  or headache.     albuterol (VENTOLIN HFA) 108 (90 Base) MCG/ACT inhaler INHALE 2 PUFFS INTO THE LUNGS EVERY 4 HOURS AS NEEDED FOR WHEEZING. (Patient taking differently: Inhale 2 puffs into the lungs every 4 (four) hours as needed for wheezing.) 8.5 g 0   ALPRAZolam (XANAX) 0.5 MG tablet Take 1 tablet (0.5 mg total) by mouth 2 (two) times daily as needed for anxiety. (Patient taking differently: Take 0.5 mg by mouth See admin instructions. 0.87m oral at bedtime And 0.525mdaily as needed for anxiety) 45 tablet 1   aspirin 81 MG EC tablet Take 1 tablet (81 mg total) by mouth daily. 21 tablet 0   atorvastatin (LIPITOR) 40 MG tablet Take 1 tablet (40 mg total) by mouth daily. 90 tablet 3   Calcium-Vitamin D-Vitamin K 650-12.5-40 MG-MCG-MCG CHEW Chew 1 tablet by mouth daily.     clopidogrel (PLAVIX) 75 MG tablet Take 1 tablet by mouth daily. 90 tablet 1   cyclobenzaprine (FLEXERIL) 10 MG tablet Take 5-10 mg by mouth at bedtime as needed for muscle spasms.     DULoxetine (CYMBALTA) 60 MG capsule Take 1 capsule (60 mg total) by mouth daily. 30 capsule 1   furosemide (LASIX) 20 MG tablet Take 1 tablet by mouth daily as needed. 90 tablet 3   gabapentin (NEURONTIN) 300 MG capsule Take 1 capsule (300 mg total) by mouth at bedtime. (Patient taking differently: Take 300 mg by mouth 2 (two) times daily.) 90 capsule 2   Galcanezumab-gnlm (EMGALITY) 120 MG/ML SOSY Inject 120 mg under the skin every 28 days 1 mL 6   HYDROcodone-acetaminophen (NORCO/VICODIN) 5-325 MG tablet  Take 1 tablet by mouth 2 (two) times daily as needed 14 tablet 0   loratadine (CLARITIN) 10 MG tablet Take 10 mg by mouth daily.     metoprolol succinate (TOPROL-XL) 25 MG 24 hr tablet Take 1 tablet by mouth daily. 90 tablet 3   Multiple Vitamin (MULTIVITAMIN WITH MINERALS) TABS tablet Take 1 tablet by mouth daily.     Na Sulfate-K Sulfate-Mg Sulf (SUPREP BOWEL PREP KIT) 17.5-3.13-1.6 GM/177ML SOLN Take 1 kit by mouth as directed. For colonoscopy  prep 354 mL 0   OLANZapine-FLUoxetine (SYMBYAX) 6-25 MG capsule Take 1 capsule by mouth at bedtime. 30 capsule 1   pantoprazole (PROTONIX) 40 MG tablet Take 1 tablet by mouth daily. 30 tablet 11   senna-docusate (SENOKOT-S) 8.6-50 MG tablet Take 2 tablets by mouth daily.     Ubrogepant (UBRELVY) 100 MG TABS TAKE  1/2 TABLET AT HEADACHE ONSET. MAY REPEAT DOSE IN TWO HOURS IF NO IMPROVEMENT. DO NOT EXCEED MORE THAN TWO TABLETS IN 24 HOURS. (Patient taking differently: Take 50-100 mg by mouth See admin instructions. Take 67m at headache onset, may repeat dose in two hours if no improvement. Do not excess more than 2085min 24 hours) 18 tablet 1   valACYclovir (VALTREX) 500 MG tablet Take 1 tablet (500 mg total) by mouth daily. 90 tablet 1   No current facility-administered medications on file prior to visit.   Past Medical History:  Diagnosis Date   Anxiety    Dyslipidemia    Fibromyalgia    Headache    Pericarditis    Seizures (HCC)    Tachycardia    TIA (transient ischemic attack)    Recurrent   Past Surgical History:  Procedure Laterality Date   ABDOMINAL HYSTERECTOMY     CHOLECYSTECTOMY, LAPAROSCOPIC      Family History  Problem Relation Age of Onset   Hypertension Mother    Hypertension Father    Heart attack Father [id number] Healthy Sister    Healthy Sister    Healthy Sister    Hypertension Brother    Diabetes type II Brother    COPD Brother    Healthy Brother    Healthy Brother    Breast cancer Maternal Aunt    Throat cancer Maternal Uncle    Hypertension Maternal Grandmother    Diabetes Paternal Grandmother    Colon cancer Neg Hx    Pancreatic cancer Neg Hx    Stomach cancer Neg Hx    Liver disease Neg Hx    Social History   Socioeconomic History   Marital status: Married    Spouse name: Not on file   Number of children: 4   Years of education: Not on file   Highest education level: Not on file  Occupational History   Not on file  Tobacco Use   Smoking  status: Never   Smokeless tobacco: Never  Vaping Use   Vaping Use: Never used  Substance and Sexual Activity   Alcohol use: Yes    Comment: occ    Drug use: No   Sexual activity: Not on file  Other Topics Concern   Not on file  Social History Narrative   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     Social Determinants of Health   Financial Resource Strain: Not on file  Food Insecurity: Not on file  Transportation Needs: Not on file  Physical Activity: Not on file  Stress: Not on file  Social  Connections: Not on file    Review of Systems  Constitutional:  Negative for appetite change, fatigue and fever.  HENT:  Negative for congestion, ear pain, sinus pressure and sore throat.   Eyes:  Negative for pain.  Respiratory:  Positive for shortness of breath. Negative for cough, chest tightness and wheezing.   Cardiovascular:  Negative for chest pain and palpitations.  Gastrointestinal:  Negative for abdominal pain, constipation, diarrhea, nausea and vomiting.  Endocrine: Negative.   Genitourinary:  Negative for dysuria and hematuria.  Musculoskeletal:  Positive for myalgias (Burning pain in the right leg). Negative for arthralgias, back pain and joint swelling.  Skin:  Negative for rash.  Allergic/Immunologic: Negative.   Neurological:  Positive for numbness (right leg) and headaches. Negative for dizziness and weakness.  Hematological: Negative.   Psychiatric/Behavioral:  Negative for dysphoric mood. The patient is not nervous/anxious.     Objective:  BP 122/82 (BP Location: Left Arm, Patient Position: Sitting)   Pulse 88   Temp (!) 97.4 F (36.3 C) (Temporal)   Ht 5' 4"  (1.626 m)   Wt 225 lb (102.1 kg)   SpO2 98%   BMI 38.62 kg/m   BP/Weight 04/13/2021 04/11/2021 0/16/5537  Systolic BP 482 707 867  Diastolic BP 82 92 91  Wt. (Lbs) 225 220 225.97  BMI 38.62 37.76 38.79  Some encounter information is confidential and restricted. Go to Review  Flowsheets activity to see all data.    Physical Exam Vitals reviewed.  Constitutional:      Appearance: Normal appearance.  HENT:     Right Ear: There is impacted cerumen.     Left Ear: Tympanic membrane, ear canal and external ear normal.     Nose: Nose normal.     Mouth/Throat:     Mouth: Mucous membranes are moist.  Cardiovascular:     Rate and Rhythm: Normal rate and regular rhythm.     Pulses: Normal pulses.     Heart sounds: Normal heart sounds.  Pulmonary:     Effort: Pulmonary effort is normal.     Breath sounds: Normal breath sounds.  Abdominal:     General: Bowel sounds are normal.     Palpations: Abdomen is soft.  Musculoskeletal:        General: Normal range of motion.     Cervical back: Normal range of motion and neck supple.  Skin:    General: Skin is warm and dry.     Capillary Refill: Capillary refill takes less than 2 seconds.  Neurological:     General: No focal deficit present.     Mental Status: She is alert and oriented to person, place, and time.  Psychiatric:        Mood and Affect: Mood normal.        Behavior: Behavior normal.     Lab Results  Component Value Date   WBC 7.7 03/29/2021   HGB 12.6 03/29/2021   HCT 39.1 03/29/2021   PLT 200 03/29/2021   GLUCOSE 104 (H) 03/29/2021   CHOL 175 01/21/2021   TRIG 127 01/21/2021   HDL 40 (L) 01/21/2021   LDLCALC 110 (H) 01/21/2021   ALT 17 03/29/2021   AST 25 03/29/2021   NA 136 03/29/2021   K 3.9 03/29/2021   CL 102 03/29/2021   CREATININE 0.91 03/29/2021   BUN 15 03/29/2021   CO2 26 03/29/2021   TSH 0.520 01/21/2021   INR 1.1 03/10/2021   HGBA1C 4.8 01/21/2021  Assessment & Plan:    1. Cerebral infarction, unspecified mechanism ([place]) - Sedimentation Rate - C-reactive protein - CYCLIC CITRUL PEPTIDE ANTIBODY, IGG/IGA  2. Dyspnea on exertion - Ambulatory referral to Pulmonology - Sedimentation Rate - C-reactive protein - CYCLIC CITRUL PEPTIDE ANTIBODY, IGG/IGA  3.  Elevated d-dimer - Sedimentation Rate - C-reactive protein - CYCLIC CITRUL PEPTIDE ANTIBODY, IGG/IGA  4. Inflammatory neuropathy (HCC) - gabapentin (NEURONTIN) 600 MG tablet; Take 1 tablet  by mouth at bedtime.  Dispense: 90 tablet; Refill: 1 - Ambulatory referral to Rheumatology  5. COVID-19 long hauler manifesting chronic dyspnea - Ambulatory referral to Pulmonology       Continue medication Increase Gabapentin to 600 mg at night We will call you with lab results We will call you with referral for rheumatologist, pulmonologist, and endocrinologist We will send records to neurologist and try to get an sooner appointment Recommend routine eye exam Follow-up in 42-month fasting    Follow-up: 319-month or sooner if needed  An After Visit Summary was printed and given to the patient.  I,Lauren M Auman,acting as a scEducation administratoror [employer] GroupNP.,have documented all relevant documentation on the behalf of ShRip HarbourNP,as directed by  ShRip HarbourNP while in the presence of ShRip HarbourNP.   I, ShRip HarbourNP, have reviewed all documentation for this visit. The documentation on 04/13/21 for the exam, diagnosis, procedures, and orders are all accurate and complete.   ShRip HarbourNP [phone]

## 2021-04-13 NOTE — Patient Instructions (Addendum)
Continue medication Increase Gabapentin to 600 mg at night We will call you with lab results We will call you with referral for rheumatologist, pulmonologist, and endocrinologist We will send records to neurologist and try to get an sooner appointment Recommend routine eye exam Follow-up in 56-month fasting  Stroke Prevention Some medical conditions and behaviors are associated with a higher chance of having a stroke. You can help prevent a stroke by making nutrition, lifestyle, and other changes, including managing any medical conditions you may have. What nutrition changes can be made?  Eat healthy foods. You can do this by: Choosing foods high in fiber, such as fresh fruits and vegetables and whole grains. Eating at least 5 or more servings of fruits and vegetables a day. Try to fill half of your plate at each meal with fruits and vegetables. Choosing lean protein foods, such as lean cuts of meat, poultry without skin, fish, tofu, beans, and nuts. Eating low-fat dairy products. Avoiding foods that are high in salt (sodium). This can help lower blood pressure. Avoiding foods that have saturated fat, trans fat, and cholesterol. This can help prevent high cholesterol. Avoiding processed and premade foods. Follow your health care provider's specific guidelines for losing weight, controlling high blood pressure (hypertension), lowering high cholesterol, and managing diabetes. These may include: Reducing your daily calorie intake. Limiting your daily sodium intake to 1,500 milligrams (mg). Using only healthy fats for cooking, such as olive oil, canola oil, or sunflower oil. Counting your daily carbohydrate intake. What lifestyle changes can be made? Maintain a healthy weight. Talk to your health care provider about your ideal weight. Get at least 30 minutes of moderate physical activity at least 5 days a week. Moderate activity includes brisk walking, biking, and swimming. Do not use any  products that contain nicotine or tobacco, such as cigarettes and e-cigarettes. If you need help quitting, ask your health care provider. It may also be helpful to avoid exposure to secondhand smoke. Limit alcohol intake to no more than 1 drink a day for nonpregnant women and 2 drinks a day for men. One drink equals 12 oz of beer, 5 oz of wine, or 1 oz of hard liquor. Stop any illegal drug use. Avoid taking birth control pills. Talk to your health care provider about the risks of taking birth control pills if: You are over 360years old. You smoke. You get migraines. You have ever had a blood clot. What other changes can be made? Manage your cholesterol levels. Eating a healthy diet is important for preventing high cholesterol. If cholesterol cannot be managed through diet alone, you may also need to take medicines. Take any prescribed medicines to control your cholesterol as told by your health care provider. Manage your diabetes. Eating a healthy diet and exercising regularly are important parts of managing your blood sugar. If your blood sugar cannot be managed through diet and exercise, you may need to take medicines. Take any prescribed medicines to control your diabetes as told by your health care provider. Control your hypertension. To reduce your risk of stroke, try to keep your blood pressure below 130/80. Eating a healthy diet and exercising regularly are an important part of controlling your blood pressure. If your blood pressure cannot be managed through diet and exercise, you may need to take medicines. Take any prescribed medicines to control hypertension as told by your health care provider. Ask your health care provider if you should monitor your blood pressure at home. Have your blood  pressure checked every year, even if your blood pressure is normal. Blood pressure increases with age and some medical conditions. Get evaluated for sleep disorders (sleep apnea). Talk to your  health care provider about getting a sleep evaluation if you snore a lot or have excessive sleepiness. Take over-the-counter and prescription medicines only as told by your health care provider. Aspirin or blood thinners (antiplatelets or anticoagulants) may be recommended to reduce your risk of forming blood clots that can lead to stroke. Make sure that any other medical conditions you have, such as atrial fibrillation or atherosclerosis, are managed. What are the warning signs of a stroke? The warning signs of a stroke can be easily remembered as BEFAST. B is for balance. Signs include: Dizziness. Loss of balance or coordination. Sudden trouble walking. E is for eyes. Signs include: A sudden change in vision. Trouble seeing. F is for face. Signs include: Sudden weakness or numbness of the face. The face or eyelid drooping to one side. A is for arms. Signs include: Sudden weakness or numbness of the arm, usually on one side of the body. S is for speech. Signs include: Trouble speaking (aphasia). Trouble understanding. T is for time. These symptoms may represent a serious problem that is an emergency. Do not wait to see if the symptoms will go away. Get medical help right away. Call your local emergency services (911 in the [place] Do not drive yourself to the hospital. Other signs of stroke may include: A sudden, severe headache with no known cause. Nausea or vomiting. Seizure. Where to find more information For more information, visit: American Stroke Association: www.strokeassociation.org National Stroke Association: www.stroke.org Summary You can prevent a stroke by eating healthy, exercising, not smoking, limiting alcohol intake, and managing any medical conditions you may have. Do not use any products that contain nicotine or tobacco, such as cigarettes and e-cigarettes. If you need help quitting, ask your health care provider. It may also be helpful to avoid exposure to  secondhand smoke. Remember BEFAST for warning signs of stroke. Get help right away if you or a loved one has any of these signs. This information is not intended to replace advice given to you by your health care provider. Make sure you discuss any questions you have with your health care provider. Document Revised: 06/28/2017 Document Reviewed: 08/21/2016 Elsevier Patient Education  2021 [employer]. Neuropathic Pain Neuropathic pain is pain caused by damage to the nerves that are responsible for certain sensations in your body (sensory nerves). The pain can be caused by: Damage to the sensory nerves that send signals to your spinal cord and brain (peripheral nervous system). Damage to the sensory nerves in your brain or spinal cord (central nervous system). Neuropathic pain can make you more sensitive to pain. Even a minor sensation can feel very painful. This is usually a long-term condition that can be difficult to treat. The type of pain differs from person to person. It may: Start suddenly (acute), or it may develop slowly and last for a long time (chronic). Come and go as damaged nerves heal, or it may stay at the same level for years. Cause emotional distress, loss of sleep, and a lower quality of life. What are the causes? The most common cause of this condition is diabetes. Many other diseases and conditions can also cause neuropathic pain. Causes of neuropathic pain can be classified as: Toxic. This is caused by medicines and chemicals. The most common cause of toxic neuropathic pain is damage  from cancer treatments (chemotherapy). Metabolic. This can be caused by: Diabetes. This is the most common disease that damages the nerves. Lack of vitamin B from long-term alcohol abuse. Traumatic. Any injury that cuts, crushes, or stretches a nerve can cause damage and pain. A common example is feeling pain after losing an arm or leg (phantom limb pain). Compression-related. If a sensory  nerve gets trapped or compressed for a long period of time, the blood supply to the nerve can be cut off. Vascular. Many blood vessel diseases can cause neuropathic pain by decreasing blood supply and oxygen to nerves. Autoimmune. This type of pain results from diseases in which the body's defense system (immune system) mistakenly attacks sensory nerves. Examples of autoimmune diseases that can cause neuropathic pain include lupus and multiple sclerosis. Infectious. Many types of viral infections can damage sensory nerves and cause pain. Shingles infection is a common cause of this type of pain. Inherited. Neuropathic pain can be a symptom of many diseases that are passed down through families (genetic). What increases the risk? You are more likely to develop this condition if: You have diabetes. You smoke. You drink too much alcohol. You are taking certain medicines, including medicines that kill cancer cells (chemotherapy) or that treat immune system disorders. What are the signs or symptoms? The main symptom is pain. Neuropathic pain is often described as: Burning. Shock-like. Stinging. Hot or cold. Itching. How is this diagnosed? No single test can diagnose neuropathic pain. It is diagnosed based on: Physical exam and your symptoms. Your health care provider will ask you about your pain. You may be asked to use a pain scale to describe how bad your pain is. Tests. These may be done to see if you have a high sensitivity to pain and to help find the cause and location of any sensory nerve damage. They include: Nerve conduction studies to test how well nerve signals travel through your sensory nerves (electrodiagnostic testing). Stimulating your sensory nerves through electrodes on your skin and measuring the response in your spinal cord and brain (somatosensory evoked potential). Imaging studies, such as: X-rays. CT scan. MRI. How is this treated? Treatment for neuropathic pain may  change over time. You may need to try different treatment options or a combination of treatments. Some options include: Treating the underlying cause of the neuropathy, such as diabetes, kidney disease, or vitamin deficiencies. Stopping medicines that can cause neuropathy, such as chemotherapy. Medicine to relieve pain. Medicines may include: Prescription or over-the-counter pain medicine. Anti-seizure medicine. Antidepressant medicines. Pain-relieving patches that are applied to painful areas of skin. A medicine to numb the area (local anesthetic), which can be injected as a nerve block. Transcutaneous nerve stimulation. This uses electrical currents to block painful nerve signals. The treatment is painless. Alternative treatments, such as: Acupuncture. Meditation. Massage. Physical therapy. Pain management programs. Counseling. Follow these instructions at home: Medicines  Take over-the-counter and prescription medicines only as told by your health care provider. Do not drive or use heavy machinery while taking prescription pain medicine. If you are taking prescription pain medicine, take actions to prevent or treat constipation. Your health care provider may recommend that you: Drink enough fluid to keep your urine pale yellow. Eat foods that are high in fiber, such as fresh fruits and vegetables, whole grains, and beans. Limit foods that are high in fat and processed sugars, such as fried or sweet foods. Take an over-the-counter or prescription medicine for constipation. Lifestyle  Have a good support system  at home. Consider joining a chronic pain support group. Do not use any products that contain nicotine or tobacco, such as cigarettes and e-cigarettes. If you need help quitting, ask your health care provider. Do not drink alcohol. General instructions Learn as much as you can about your condition. Work closely with all your health care providers to find the treatment plan  that works best for you. Ask your health care provider what activities are safe for you. Keep all follow-up visits as told by your health care provider. This is important. Contact a health care provider if: Your pain treatments are not working. You are having side effects from your medicines. You are struggling with tiredness (fatigue), mood changes, depression, or anxiety. Summary Neuropathic pain is pain caused by damage to the nerves that are responsible for certain sensations in your body (sensory nerves). Neuropathic pain may come and go as damaged nerves heal, or it may stay at the same level for years. Neuropathic pain is usually a long-term condition that can be difficult to treat. Consider joining a chronic pain support group. This information is not intended to replace advice given to you by your health care provider. Make sure you discuss any questions you have with your health care provider. Document Revised: 11/06/2018 Document Reviewed: 08/02/2017 Elsevier Patient Education  2022 [employer].

## 2021-04-14 ENCOUNTER — Other Ambulatory Visit (HOSPITAL_COMMUNITY): Payer: Self-pay

## 2021-04-14 ENCOUNTER — Ambulatory Visit (INDEPENDENT_AMBULATORY_CARE_PROVIDER_SITE_OTHER): Payer: 59 | Admitting: Clinical

## 2021-04-14 DIAGNOSIS — F4310 Post-traumatic stress disorder, unspecified: Secondary | ICD-10-CM

## 2021-04-14 DIAGNOSIS — F331 Major depressive disorder, recurrent, moderate: Secondary | ICD-10-CM

## 2021-04-14 DIAGNOSIS — R440 Auditory hallucinations: Secondary | ICD-10-CM

## 2021-04-14 DIAGNOSIS — R441 Visual hallucinations: Secondary | ICD-10-CM

## 2021-04-14 DIAGNOSIS — F419 Anxiety disorder, unspecified: Secondary | ICD-10-CM

## 2021-04-14 LAB — C-REACTIVE PROTEIN: CRP: <1 mg/L (ref 0–10)

## 2021-04-14 LAB — SEDIMENTATION RATE: Sed Rate: 2 mm/hr (ref 0–32)

## 2021-04-14 LAB — CYCLIC CITRUL PEPTIDE ANTIBODY, IGG/IGA: Cyclic Citrullin Peptide Ab: 1 units (ref 0–19)

## 2021-04-14 NOTE — Progress Notes (Signed)
   THERAPIST PROGRESS NOTE  Session Time: 8am  Participation Level: Active  Behavioral Response: NAAlertAnxious  Type of Therapy: Individual Therapy  Treatment Goals addressed: Anxiety and Coping  Interventions: Supportive Virtual Visit via Telephone Note  I connected with [name] on 04/14/21 at  8:00 AM EDT by telephone and verified that I am speaking with the correct person using two identifiers.  Location: Patient: home Provider: office   I discussed the limitations, risks, security and privacy concerns of performing an evaluation and management service by telephone and the availability of in person appointments. I also discussed with the patient that there may be a patient responsible charge related to this service. The patient expressed understanding and agreed to proceed.   I discussed the assessment and treatment plan with the patient. The patient was provided an opportunity to ask questions and all were answered. The patient agreed with the plan and demonstrated an understanding of the instructions.   The patient was advised to call back or seek an in-person evaluation if the symptoms worsen or if the condition fails to improve as anticipated.  I provided 40 minutes of non-face-to-face time during this encounter.  Summary: Pt describes her mood as "anxious" Pt states she has had two hospital visits due to physical health concerns since last session. Pt says she is wearing a heart monitor for two weeks and has several referrals to specialist to address physical health concerns. Pt states she has not been able to go for long walks due to getting out of breath easily but says she has been able to light activity on the treadmill. Pt states she is a part of a bookclub and says she finds reading enjoyable. Per pt report, she experienced crying spells, AH/VH for several week but attributes this to getting her prescription medications confused. Pt says she confused her Symbiax with  Gabbapentin medication. According to pt since taking the medication as prescribed she reports fewer AH/VH.  Suicidal/Homicidal: Pt denies SI/HI no plan, intent or attempt to harm self or others reported.  Therapist Response: CSW reviewed grounding techniques to manage anxiety. CSW verbally praised pt for progress made managing her anxiety in a healthy manner. CSW listened as pt discussed applying grounding techniques to manage anxiety inducing situations. CSW discussed with pt allowing flexibility in self care routine while physical health concerns are being addressed.  Plan: Return again in 2 weeks.  Diagnosis: Axis I:  PTSD                                     Moderate episode major depressive disorder                                     Anxiety                                     Visual and auditory hallucination   Axis II: No diagnosis    Yvette Rack, LCSW 04/14/2021

## 2021-04-18 ENCOUNTER — Telehealth (INDEPENDENT_AMBULATORY_CARE_PROVIDER_SITE_OTHER): Payer: 59 | Admitting: Psychiatry

## 2021-04-18 ENCOUNTER — Other Ambulatory Visit (HOSPITAL_COMMUNITY): Payer: Self-pay

## 2021-04-18 ENCOUNTER — Ambulatory Visit: Payer: 59 | Admitting: Physical Therapy

## 2021-04-18 ENCOUNTER — Other Ambulatory Visit: Payer: Self-pay

## 2021-04-18 ENCOUNTER — Encounter (HOSPITAL_COMMUNITY): Payer: Self-pay | Admitting: Psychiatry

## 2021-04-18 DIAGNOSIS — F4310 Post-traumatic stress disorder, unspecified: Secondary | ICD-10-CM

## 2021-04-18 DIAGNOSIS — F419 Anxiety disorder, unspecified: Secondary | ICD-10-CM | POA: Diagnosis not present

## 2021-04-18 DIAGNOSIS — F331 Major depressive disorder, recurrent, moderate: Secondary | ICD-10-CM | POA: Diagnosis not present

## 2021-04-18 DIAGNOSIS — R441 Visual hallucinations: Secondary | ICD-10-CM

## 2021-04-18 MED ORDER — DULOXETINE HCL 60 MG PO CPEP
60.0000 mg | ORAL_CAPSULE | Freq: Every day | ORAL | 2 refills | Status: DC
Start: 2021-04-18 — End: 2021-06-02
  Filled 2021-04-18 – 2021-05-10 (×2): qty 30, 30d supply, fill #0

## 2021-04-18 MED ORDER — OLANZAPINE-FLUOXETINE HCL 6-25 MG PO CAPS
1.0000 | ORAL_CAPSULE | Freq: Every day | ORAL | 2 refills | Status: DC
  Filled 2021-04-18 – 2021-05-10 (×2): qty 30, 30d supply, fill #0

## 2021-04-18 MED ORDER — ALPRAZOLAM 0.5 MG PO TABS
0.5000 mg | ORAL_TABLET | Freq: Two times a day (BID) | ORAL | 2 refills | Status: DC | PRN
  Filled 2021-04-18 – 2021-05-10 (×2): qty 45, 23d supply, fill #0
  Filled 2021-06-11: qty 45, 23d supply, fill #1
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
## 2021-04-19 ENCOUNTER — Other Ambulatory Visit (HOSPITAL_COMMUNITY): Payer: Self-pay

## 2021-04-19 ENCOUNTER — Encounter: Payer: Self-pay | Admitting: Nurse Practitioner

## 2021-04-19 DIAGNOSIS — N3289 Other specified disorders of bladder: Secondary | ICD-10-CM | POA: Diagnosis not present

## 2021-04-19 DIAGNOSIS — R310 Gross hematuria: Secondary | ICD-10-CM | POA: Diagnosis not present

## 2021-04-19 DIAGNOSIS — N390 Urinary tract infection, site not specified: Secondary | ICD-10-CM | POA: Diagnosis not present

## 2021-04-19 DIAGNOSIS — R350 Frequency of micturition: Secondary | ICD-10-CM | POA: Diagnosis not present

## 2021-04-19 MED ORDER — TOLTERODINE TARTRATE ER 2 MG PO CP24
2.0000 mg | ORAL_CAPSULE | Freq: Every day | ORAL | 2 refills | Status: DC
  Filled 2021-04-19: qty 30, 30d supply, fill #0

## 2021-04-20 ENCOUNTER — Ambulatory Visit: Payer: 59 | Admitting: Physical Therapy

## 2021-04-20 ENCOUNTER — Other Ambulatory Visit (HOSPITAL_COMMUNITY): Payer: Self-pay

## 2021-04-21 ENCOUNTER — Other Ambulatory Visit (HOSPITAL_COMMUNITY): Payer: Self-pay

## 2021-04-21 ENCOUNTER — Ambulatory Visit: Payer: Self-pay | Admitting: Nurse Practitioner

## 2021-04-21 DIAGNOSIS — M5416 Radiculopathy, lumbar region: Secondary | ICD-10-CM | POA: Diagnosis not present

## 2021-04-21 DIAGNOSIS — M5126 Other intervertebral disc displacement, lumbar region: Secondary | ICD-10-CM | POA: Diagnosis not present

## 2021-04-21 MED ORDER — HYDROCODONE-ACETAMINOPHEN 5-325 MG PO TABS
1.0000 | ORAL_TABLET | Freq: Every day | ORAL | 0 refills | Status: DC | PRN
  Filled 2021-04-21: qty 15, 15d supply, fill #0

## 2021-04-24 DIAGNOSIS — R55 Syncope and collapse: Secondary | ICD-10-CM | POA: Diagnosis not present

## 2021-04-24 DIAGNOSIS — R410 Disorientation, unspecified: Secondary | ICD-10-CM | POA: Diagnosis not present

## 2021-04-24 DIAGNOSIS — R064 Hyperventilation: Secondary | ICD-10-CM | POA: Diagnosis not present

## 2021-04-24 DIAGNOSIS — R404 Transient alteration of awareness: Secondary | ICD-10-CM | POA: Diagnosis not present

## 2021-04-24 DIAGNOSIS — G4489 Other headache syndrome: Secondary | ICD-10-CM | POA: Diagnosis not present

## 2021-04-24 DIAGNOSIS — R531 Weakness: Secondary | ICD-10-CM | POA: Diagnosis not present

## 2021-04-25 ENCOUNTER — Ambulatory Visit: Payer: 59 | Admitting: Physical Therapy

## 2021-04-25 ENCOUNTER — Ambulatory Visit (INDEPENDENT_AMBULATORY_CARE_PROVIDER_SITE_OTHER): Payer: 59 | Admitting: Clinical

## 2021-04-25 ENCOUNTER — Encounter: Payer: 59 | Admitting: Gastroenterology

## 2021-04-25 ENCOUNTER — Other Ambulatory Visit: Payer: Self-pay

## 2021-04-25 DIAGNOSIS — F4310 Post-traumatic stress disorder, unspecified: Secondary | ICD-10-CM | POA: Diagnosis not present

## 2021-04-25 DIAGNOSIS — F419 Anxiety disorder, unspecified: Secondary | ICD-10-CM | POA: Diagnosis not present

## 2021-04-25 DIAGNOSIS — F331 Major depressive disorder, recurrent, moderate: Secondary | ICD-10-CM

## 2021-04-25 NOTE — Progress Notes (Deleted)
Pt was seen on 04/11/21 with symptoms of CVA. Her PCP noted that she will follow up with neurology, but has not since the event. Her EGD, and colonoscopy were cancelled for her safety.   K.Airen Dales, CRNA

## 2021-04-25 NOTE — Progress Notes (Signed)
   THERAPIST PROGRESS NOTE  Session Time: 8am  Participation Level: Active  Behavioral Response: CasualAlert"okay"  Type of Therapy: Individual Therapy  Treatment Goals addressed: Coping  Interventions: CBT Virtual Visit via Video Note  I connected with Desiree Russell on 04/25/21 at  8:00 AM EDT by a video enabled telemedicine application and verified that I am speaking with the correct person using two identifiers.  Location: Patient: home Provider: office   I discussed the limitations of evaluation and management by telemedicine and the availability of in person appointments. The patient expressed understanding and agreed to proceed.   I discussed the assessment and treatment plan with the patient. The patient was provided an opportunity to ask questions and all were answered. The patient agreed with the plan and demonstrated an understanding of the instructions.   The patient was advised to call back or seek an in-person evaluation if the symptoms worsen or if the condition fails to improve as anticipated.  I provided 43 minutes of non-face-to-face time during this encounter.   Summary: Pt presents in a pleasant mood today, displaying bright affect. Pt reports she continues to have physical health challenges that make it difficult engage in physical activity. Pt states when she walks she experiences shortness of breath and states today having fibromyalgia flare ups. Nevertheless, pt reports her husband and daughter have been very supportive. Pt says when she becomes anxious they remind her to practice grounding techniques and deep breathing exercises. Pt states she has found those interventions helpful in managing her anxiety. Additionally, pt reports she is enjoying being a part of a book club and identifies her church family as a good support.  Suicidal/Homicidal: Pt denies any SI/HI. Pt denies any AH/VH.  Therapist Response: CSW assessed for changes in mood, behavior and daily  functioning. CSW utilized cognitive restructuring to assist pt in challenging and changing irrational thoughts. CSW modeled using socratic questions to challenge pt thinking when she stated having difficulty accepting compliments as being genuine from others. CSW discussed with pt about the importance of self compassion when mistakes are made or challenging situations present.  Plan: Return again in 2 weeks.  Diagnosis: Axis I: PTSD                                     Moderate episode major depressive disorder                                     Anxiety    Axis II: No diagnosis    Yvette Rack, LCSW 04/25/2021

## 2021-04-26 ENCOUNTER — Telehealth: Payer: Self-pay

## 2021-04-26 ENCOUNTER — Other Ambulatory Visit (HOSPITAL_COMMUNITY): Payer: Self-pay

## 2021-04-26 DIAGNOSIS — K625 Hemorrhage of anus and rectum: Secondary | ICD-10-CM

## 2021-04-26 DIAGNOSIS — K2100 Gastro-esophageal reflux disease with esophagitis, without bleeding: Secondary | ICD-10-CM

## 2021-04-26 DIAGNOSIS — K581 Irritable bowel syndrome with constipation: Secondary | ICD-10-CM

## 2021-04-26 MED ORDER — NA SULFATE-K SULFATE-MG SULF 17.5-3.13-1.6 GM/177ML PO SOLN
1.0000 | ORAL | 0 refills | Status: DC
  Filled 2021-04-26: qty 354, 1d supply, fill #0

## 2021-04-26 NOTE — Telephone Encounter (Signed)
LVM for patient to call back.   She sees her neurologist on 10-24 we need to get clearance she needs to ask about this when she is there and I wanted to see about getting her scheduled for EGD/Colon at [facility] to hold spot in November as well if possible. Will need 1 hour slot

## 2021-04-26 NOTE — Telephone Encounter (Signed)
Patient scheduled for 11-10 for EGD/Colon and we have sent in the prep. Patient says he had her instructions and ca do the new times. She will call with any questions or concerns. Amb referral placed too

## 2021-04-27 ENCOUNTER — Ambulatory Visit (INDEPENDENT_AMBULATORY_CARE_PROVIDER_SITE_OTHER): Payer: 59 | Admitting: Nurse Practitioner

## 2021-04-27 ENCOUNTER — Ambulatory Visit: Payer: 59 | Admitting: Physical Therapy

## 2021-04-27 ENCOUNTER — Other Ambulatory Visit: Payer: Self-pay

## 2021-04-27 ENCOUNTER — Encounter: Payer: Self-pay | Admitting: Nurse Practitioner

## 2021-04-27 VITALS — BP 132/82 | HR 99 | Temp 98.0°F | Ht 64.0 in | Wt 227.0 lb

## 2021-04-27 DIAGNOSIS — J018 Other acute sinusitis: Secondary | ICD-10-CM

## 2021-04-27 DIAGNOSIS — Z8619 Personal history of other infectious and parasitic diseases: Secondary | ICD-10-CM

## 2021-04-27 DIAGNOSIS — J3089 Other allergic rhinitis: Secondary | ICD-10-CM

## 2021-04-27 DIAGNOSIS — J029 Acute pharyngitis, unspecified: Secondary | ICD-10-CM

## 2021-04-27 DIAGNOSIS — R7989 Other specified abnormal findings of blood chemistry: Secondary | ICD-10-CM | POA: Diagnosis not present

## 2021-04-27 DIAGNOSIS — R0600 Dyspnea, unspecified: Secondary | ICD-10-CM | POA: Diagnosis not present

## 2021-04-27 LAB — POC COVID19 BINAXNOW: SARS Coronavirus 2 Ag: NEGATIVE

## 2021-04-27 LAB — POCT RAPID STREP A (OFFICE): Rapid Strep A Screen: NEGATIVE

## 2021-04-27 MED ORDER — AMOXICILLIN 875 MG PO TABS: 875.0000 mg | ORAL_TABLET | Freq: Two times a day (BID) | ORAL | 0 refills | Status: AC

## 2021-04-27 MED ORDER — AZELASTINE HCL 0.1 % NA SOLN: 1.0000 | Freq: Two times a day (BID) | NASAL | 12 refills | Status: DC

## 2021-04-27 MED ORDER — FLUCONAZOLE 150 MG PO TABS
150.0000 mg | ORAL_TABLET | Freq: Once | ORAL | 0 refills | Status: AC
Start: 2021-04-27 — End: 2021-04-27

## 2021-04-27 NOTE — Patient Instructions (Signed)
Use Astelin nasal spray twice daily Take Amoxicillin twice daily for 10 days Keep appts with specialists as scheduled  Follow-up in 87-months  Sinusitis, Adult Sinusitis is soreness and swelling (inflammation) of your sinuses. Sinuses are hollow spaces in the bones around your face. They are located: Around your eyes. In the middle of your forehead. Behind your nose. In your cheekbones. Your sinuses and nasal passages are lined with a fluid called mucus. Mucus drains out of your sinuses. Swelling can trap mucus in your sinuses. This lets germs (bacteria, virus, or fungus) grow, which leads to infection. Most of the time, this condition is caused by a virus. What are the causes? This condition is caused by: Allergies. Asthma. Germs. Things that block your nose or sinuses. Growths in the nose (nasal polyps). Chemicals or irritants in the air. Fungus (rare). What increases the risk? You are more likely to develop this condition if: You have a weak body defense system (immune system). You do a lot of swimming or diving. You use nasal sprays too much. You smoke. What are the signs or symptoms? The main symptoms of this condition are pain and a feeling of pressure around the sinuses. Other symptoms include: Stuffy nose (congestion). Runny nose (drainage). Swelling and warmth in the sinuses. Headache. Toothache. A cough that may get worse at night. Mucus that collects in the throat or the back of the nose (postnasal drip). Being unable to smell and taste. Being very tired (fatigue). A fever. Sore throat. Bad breath. How is this diagnosed? This condition is diagnosed based on: Your symptoms. Your medical history. A physical exam. Tests to find out if your condition is short-term (acute) or long-term (chronic). Your doctor may: Check your nose for growths (polyps). Check your sinuses using a tool that has a light (endoscope). Check for allergies or germs. Do imaging tests,  such as an MRI or CT scan. How is this treated? Treatment for this condition depends on the cause and whether it is short-term or long-term. If caused by a virus, your symptoms should go away on their own within 10 days. You may be given medicines to relieve symptoms. They include: Medicines that shrink swollen tissue in the nose. Medicines that treat allergies (antihistamines). A spray that treats swelling of the nostrils.  Rinses that help get rid of thick mucus in your nose (nasal saline washes). If caused by bacteria, your doctor may wait to see if you will get better without treatment. You may be given antibiotic medicine if you have: A very bad infection. A weak body defense system. If caused by growths in the nose, you may need to have surgery. Follow these instructions at home: Medicines Take, use, or apply over-the-counter and prescription medicines only as told by your doctor. These may include nasal sprays. If you were prescribed an antibiotic medicine, take it as told by your doctor. Do not stop taking the antibiotic even if you start to feel better. Hydrate and humidify  Drink enough water to keep your pee (urine) pale yellow. Use a cool mist humidifier to keep the humidity level in your home above 50%. Breathe in steam for 10-15 minutes, 3-4 times a day, or as told by your doctor. You can do this in the bathroom while a hot shower is running. Try not to spend time in cool or dry air. Rest Rest as much as you can. Sleep with your head raised (elevated). Make sure you get enough sleep each night. General instructions  Put  a warm, moist washcloth on your face 3-4 times a day, or as often as told by your doctor. This will help with discomfort. Wash your hands often with soap and water. If there is no soap and water, use hand sanitizer. Do not smoke. Avoid being around people who are smoking (secondhand smoke). Keep all follow-up visits as told by your doctor. This is  important. Contact a doctor if: You have a fever. Your symptoms get worse. Your symptoms do not get better within 10 days. Get help right away if: You have a very bad headache. You cannot stop throwing up (vomiting). You have very bad pain or swelling around your face or eyes. You have trouble seeing. You feel confused. Your neck is stiff. You have trouble breathing. Summary Sinusitis is swelling of your sinuses. Sinuses are hollow spaces in the bones around your face. This condition is caused by tissues in your nose that become inflamed or swollen. This traps germs. These can lead to infection. If you were prescribed an antibiotic medicine, take it as told by your doctor. Do not stop taking it even if you start to feel better. Keep all follow-up visits as told by your doctor. This is important. This information is not intended to replace advice given to you by your health care provider. Make sure you discuss any questions you have with your health care provider. Document Revised: 12/16/2017 Document Reviewed: 12/16/2017 Elsevier Patient Education  2022 [employer].

## 2021-04-27 NOTE — Progress Notes (Signed)
Established Patient Office Visit  Subjective:  Patient ID: Desiree Russell, female    DOB: [birth year]  Age: 50 y.o. MRN: [id number]  CC:  Sinus congestion  HPI Desiree Russell presents for evaluation of sinus congestion, post-nasal-drip, sore throat, and generalized malaise. Onset of symptoms was two days ago.Denies treatment. She has a past medical history of seasonal allergic rhinitis. States Flonase nasal spray caused nosebleeds.   [name] has a complicated past medical history. She has small vessel disease, CVA x 2, TIAs, pericardial effusion, and history of pericarditis. Elevated D-dimer on 03/29/21 while in ED at [facility]. She has experienced dyspnea with exertion after COVID-19 diagnosis. She has upcoming appointments with several specialists to investigate causes of multiple health problems.  She tells me that she had a panic attack two days ago after a tragic murder-suicide of two close family members. She is followed by psych. Currently prescribed Cymbalta 60 mg and Xanax 0.5 mg BID PRN.   Past Medical History:  Diagnosis Date   Anxiety    Dyslipidemia    Fibromyalgia    Headache    Pericarditis    Seizures (HCC)    Tachycardia    TIA (transient ischemic attack)    Recurrent    Past Surgical History:  Procedure Laterality Date   ABDOMINAL HYSTERECTOMY     CHOLECYSTECTOMY, LAPAROSCOPIC      Family History  Problem Relation Age of Onset   Hypertension Mother    Hypertension Father    Heart attack Father 62   Healthy Sister    Healthy Sister    Healthy Sister    Hypertension Brother    Diabetes type II Brother    COPD Brother    Healthy Brother    Healthy Brother    Breast cancer Maternal Aunt    Throat cancer Maternal Uncle    Hypertension Maternal Grandmother    Diabetes Paternal Grandmother    Colon cancer Neg Hx    Pancreatic cancer Neg Hx    Stomach cancer Neg Hx    Liver disease Neg Hx     Social History   Socioeconomic History    Marital status: Married    Spouse name: Not on file   Number of children: 4   Years of education: Not on file   Highest education level: Not on file  Occupational History   Not on file  Tobacco Use   Smoking status: Never   Smokeless tobacco: Never  Vaping Use   Vaping Use: Never used  Substance and Sexual Activity   Alcohol use: Yes    Comment: occ    Drug use: No   Sexual activity: Not on file  Other Topics Concern   Not on file  Social History Narrative   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     Social Determinants of Health   Financial Resource Strain: Not on file  Food Insecurity: Not on file  Transportation Needs: Not on file  Physical Activity: Not on file  Stress: Not on file  Social Connections: Not on file  Intimate Partner Violence: Not on file    Outpatient Medications Prior to Visit  Medication Sig Dispense Refill   acetaminophen (TYLENOL) 325 MG tablet Take 650 mg by mouth every 6 (six) hours as needed for mild pain, fever or headache.     albuterol (VENTOLIN HFA) 108 (90 Base) MCG/ACT inhaler INHALE 2 PUFFS INTO THE LUNGS EVERY 4 HOURS AS NEEDED FOR WHEEZING. (  Patient taking differently: Inhale 2 puffs into the lungs every 4 (four) hours as needed for wheezing.) 8.5 g 0   ALPRAZolam (XANAX) 0.5 MG tablet Take 1 tablet by mouth 2 times daily as needed for anxiety. 45 tablet 2   aspirin 81 MG EC tablet Take 1 tablet (81 mg total) by mouth daily. 21 tablet 0   atorvastatin (LIPITOR) 40 MG tablet Take 1 tablet (40 mg total) by mouth daily. 90 tablet 3   Calcium-Vitamin D-Vitamin K 650-12.5-40 MG-MCG-MCG CHEW Chew 1 tablet by mouth daily.     clopidogrel (PLAVIX) 75 MG tablet Take 1 tablet by mouth daily. 90 tablet 1   cyclobenzaprine (FLEXERIL) 10 MG tablet Take 5-10 mg by mouth at bedtime as needed for muscle spasms.     DULoxetine (CYMBALTA) 60 MG capsule Take 1 capsule by mouth daily. 30 capsule 2   furosemide (LASIX) 20 MG tablet Take 1  tablet by mouth daily as needed. 90 tablet 3   gabapentin (NEURONTIN) 600 MG tablet Take 1 tablet  by mouth at bedtime. 90 tablet 1   Galcanezumab-gnlm (EMGALITY) 120 MG/ML SOSY Inject 120 mg under the skin every 28 days 1 mL 6   HYDROcodone-acetaminophen (NORCO/VICODIN) 5-325 MG tablet Take 1 tablet by mouth 2 (two) times daily as needed 14 tablet 0   HYDROcodone-acetaminophen (NORCO/VICODIN) 5-325 MG tablet Take 1 tablet by mouth once daily as needed 15 tablet 0   loratadine (CLARITIN) 10 MG tablet Take 10 mg by mouth daily.     metoprolol succinate (TOPROL-XL) 25 MG 24 hr tablet Take 1 tablet by mouth daily. 90 tablet 3   Multiple Vitamin (MULTIVITAMIN WITH MINERALS) TABS tablet Take 1 tablet by mouth daily.     Na Sulfate-K Sulfate-Mg Sulf (SUPREP BOWEL PREP KIT) 17.5-3.13-1.6 GM/177ML SOLN Take 1 kit by mouth as directed. For colonoscopy prep 354 mL 0   OLANZapine-FLUoxetine (SYMBYAX) 6-25 MG capsule Take 1 capsule by mouth at bedtime. 30 capsule 2   pantoprazole (PROTONIX) 40 MG tablet Take 1 tablet by mouth daily. 30 tablet 11   senna-docusate (SENOKOT-S) 8.6-50 MG tablet Take 2 tablets by mouth daily.     tolterodine (DETROL LA) 2 MG 24 hr capsule Take 1 capsule (2 mg total) by mouth daily after meals for urinary frequency 30 capsule 2   Ubrogepant (UBRELVY) 100 MG TABS TAKE  1/2 TABLET AT HEADACHE ONSET. MAY REPEAT DOSE IN TWO HOURS IF NO IMPROVEMENT. DO NOT EXCEED MORE THAN TWO TABLETS IN 24 HOURS. (Patient taking differently: Take 50-100 mg by mouth See admin instructions. Take 28m at headache onset, may repeat dose in two hours if no improvement. Do not excess more than 2052min 24 hours) 18 tablet 1   valACYclovir (VALTREX) 500 MG tablet Take 1 tablet (500 mg total) by mouth daily. 90 tablet 1   No facility-administered medications prior to visit.    Allergies  Allergen Reactions   Codeine Hives and Rash   Morphine Hives and Other (See Comments)    Headaches, also   Morphine And  Related Hives   Oxycodone Hives   Chlorhexidine Hives and Rash         ROS Review of Systems  Constitutional:  Positive for fatigue and unexpected weight change (weight gain). Negative for appetite change, chills and fever.  HENT:  Positive for congestion, ear pain (left), postnasal drip, rhinorrhea, sinus pain and sore throat. Negative for sinus pressure and tinnitus.   Eyes:  Positive for pain (behind bilateral  eyes).  Respiratory:  Positive for shortness of breath. Negative for cough.   Cardiovascular:  Negative for chest pain, palpitations and leg swelling.  Gastrointestinal:  Negative for abdominal pain, constipation, diarrhea, nausea and vomiting.  Endocrine: Negative for cold intolerance, heat intolerance, polydipsia, polyphagia and polyuria.  Genitourinary:  Negative for dysuria, frequency and hematuria.  Musculoskeletal:  Positive for arthralgias and myalgias. Negative for back pain and joint swelling.  Skin:  Negative for rash.  Allergic/Immunologic: Positive for environmental allergies.  Neurological:  Positive for headaches. Negative for dizziness.  Hematological:  Negative for adenopathy.  Psychiatric/Behavioral:  Negative for decreased concentration and sleep disturbance. The patient is not nervous/anxious.      Objective:    Physical Exam Constitutional:      Appearance: Normal appearance.  HENT:     Head: Normocephalic.     Right Ear: Tympanic membrane normal.     Left Ear: Tenderness present. Tympanic membrane is erythematous.     Nose: Nose normal.     Mouth/Throat:     Mouth: Mucous membranes are moist.     Tongue: No lesions. Tongue does not deviate from midline.     Pharynx: Oropharyngeal exudate and posterior oropharyngeal erythema present.  Neck:     Vascular: No carotid bruit.  Cardiovascular:     Rate and Rhythm: Normal rate and regular rhythm.     Pulses: Normal pulses.     Heart sounds: Normal heart sounds.  Pulmonary:     Effort: Pulmonary  effort is normal.     Breath sounds: Normal breath sounds.  Abdominal:     General: Bowel sounds are normal.     Palpations: Abdomen is soft.     Tenderness: There is no abdominal tenderness. There is no guarding.  Musculoskeletal:        General: No swelling.  Skin:    General: Skin is warm and dry.     Capillary Refill: Capillary refill takes less than 2 seconds.  Neurological:     Mental Status: She is alert and oriented to person, place, and time.  Psychiatric:        Mood and Affect: Mood normal.        Behavior: Behavior normal.   BP 132/82 (BP Location: Left Arm, Patient Position: Sitting)   Pulse 99   Temp 98 F (36.7 C) (Temporal)   Ht 5' 4"  (1.626 m)   Wt 227 lb (103 kg)   SpO2 99%   BMI 38.96 kg/m  Wt Readings from Last 3 Encounters:  04/27/21 227 lb (103 kg)  04/13/21 225 lb (102.1 kg)  04/11/21 220 lb (99.8 kg)     Health Maintenance Due  Topic Date Due   PAP SMEAR-Modifier  Never done   COVID-19 Vaccine (3 - Pfizer risk series) 12/22/2019   INFLUENZA VACCINE  02/27/2021      Lab Results  Component Value Date   TSH 0.520 01/21/2021   Lab Results  Component Value Date   WBC 7.7 03/29/2021   HGB 12.6 03/29/2021   HCT 39.1 03/29/2021   MCV 92.0 03/29/2021   PLT 200 03/29/2021   Lab Results  Component Value Date   NA 136 03/29/2021   K 3.9 03/29/2021   CO2 26 03/29/2021   GLUCOSE 104 (H) 03/29/2021   BUN 15 03/29/2021   CREATININE 0.91 03/29/2021   BILITOT 0.5 03/29/2021   ALKPHOS 51 03/29/2021   AST 25 03/29/2021   ALT 17 03/29/2021   PROT 7.3 03/29/2021  ALBUMIN 4.2 03/29/2021   CALCIUM 9.6 03/29/2021   ANIONGAP 8 03/29/2021   Lab Results  Component Value Date   CHOL 175 01/21/2021   Lab Results  Component Value Date   HDL 40 (L) 01/21/2021   Lab Results  Component Value Date   LDLCALC 110 (H) 01/21/2021   Lab Results  Component Value Date   TRIG 127 01/21/2021   Lab Results  Component Value Date   CHOLHDL 4.4  01/21/2021   Lab Results  Component Value Date   HGBA1C 4.8 01/21/2021      Assessment & Plan:     1. Acute non-recurrent sinusitis of other sinus - amoxicillin (AMOXIL) 875 MG tablet; Take 1 tablet (875 mg total) by mouth 2 (two) times daily for 10 days.  Dispense: 20 tablet; Refill: 0 - azelastine (ASTELIN) 0.1 % nasal spray; Place 1 spray into both nostrils 2 (two) times daily. Use in each nostril as directed  Dispense: 30 mL; Refill: 12 - POC COVID-19 BinaxNow-negative  2. Seasonal allergic rhinitis due to other allergic trigger - azelastine (ASTELIN) 0.1 % nasal spray; Place 1 spray into both nostrils 2 (two) times daily. Use in each nostril as directed  Dispense: 30 mL; Refill: 12  3. History of candidiasis of vagina - fluconazole (DIFLUCAN) 150 MG tablet; Take 1 tablet (150 mg total) by mouth once for 1 dose.  Dispense: 1 tablet; Refill: 0  4. Sore throat - POCT rapid strep A-negative  5. Elevated d-dimer - D-dimer, quantitative    Use Astelin nasal spray twice daily Take Amoxicillin twice daily for 10 days Keep appts with specialists as scheduled  Follow-up in 57-month  Follow-up: 374-month   I, ShRip HarbourNP, have reviewed all documentation for this visit. The documentation on 04/27/21 for the exam, diagnosis, procedures, and orders are all accurate and complete.    Signed, Jerrell BelfastDNP 04/27/21 at 10:58 PM

## 2021-04-28 ENCOUNTER — Other Ambulatory Visit (HOSPITAL_COMMUNITY): Payer: Self-pay

## 2021-04-28 LAB — D-DIMER, QUANTITATIVE: D-DIMER: 0.36 mg/L FEU (ref 0.00–0.49)

## 2021-04-28 MED ORDER — OXYBUTYNIN CHLORIDE ER 5 MG PO TB24
ORAL_TABLET | ORAL | 2 refills | Status: DC
  Filled 2021-04-28: qty 30, 30d supply, fill #0

## 2021-05-03 ENCOUNTER — Telehealth: Payer: Self-pay | Admitting: Nurse Practitioner

## 2021-05-03 NOTE — Telephone Encounter (Signed)
Patient called about a referral to Endocrinology and I don't see one. Was she supposed to be referred to Endocrinology

## 2021-05-03 NOTE — Progress Notes (Signed)
Synopsis: Referred for long covid by Rip Harbour, NP  Subjective:   PATIENT ID: Desiree Russell GENDER: female DOB: Apr 26, 1971, MRN: [id number]  Chief Complaint  Patient presents with   Consult    Patient reports that she feels short of breath on exertion and walking up steps.    58yF with  history GERD, pericarditis, recurrent TIA, seizures, severe obesity, covid-19 infection 07/2020 who is referred with concern for long covid with DOE.  She was never hospitalized for covid-19. Oxygen saturation was in 80s during her recovery in February and was sent to ED from clinic. She was discharged from ED and encouraged to use inhaler. No steroids or specific treatment for covid. She was vaccinated for covid x2 but hadn't had booster before infection. Since then she has had DOE to 50 yards. It has worsened since infection. Feels like she's wheezing at top of stairs. Does have cough when she lies down flat. It is dry. She has chest tightness but not pain. No new leg pain other than what she attributes to fibromyalgia. Never has had a blood clot.  She has gained some weight this year which she attributes to antidepressant. Some orthopnea but mild and stable.  She does have heartburn and takes protonix 40 mg first thing in the morning and it has been helpful but still has some symptoms. Has follow up with GI.   She had nuke stress test which was normal at [place] in September.   No significant sinonasal congestion.  Had stroke/TIA in June, was hospitalized.    She had CXR done at [facility] 04/10/21.  Otherwise pertinent review of systems is negative.  She never had asthma as a kid. Mother had emphysema. Son has asthma.   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 She has [place] in [place], [place] and [place]. She has a dog at home. She has no hot tub.   Past Medical History:  Diagnosis Date   Anxiety    Dyslipidemia    Fibromyalgia    Headache     Pericarditis    Seizures (HCC)    Tachycardia    TIA (transient ischemic attack)    Recurrent     Family History  Problem Relation Age of Onset   Hypertension Mother    Hypertension Father    Heart attack Father 56   Healthy Sister    Healthy Sister    Healthy Sister    Hypertension Brother    Diabetes type II Brother    COPD Brother    Healthy Brother    Healthy Brother    Breast cancer Maternal Aunt    Throat cancer Maternal Uncle    Hypertension Maternal Grandmother    Diabetes Paternal Grandmother    Colon cancer Neg Hx    Pancreatic cancer Neg Hx    Stomach cancer Neg Hx    Liver disease Neg Hx      Past Surgical History:  Procedure Laterality Date   ABDOMINAL HYSTERECTOMY     CHOLECYSTECTOMY, LAPAROSCOPIC      Social History   Socioeconomic History   Marital status: Married    Spouse name: Not on file   Number of children: 4   Years of education: Not on file   Highest education level: Not on file  Occupational History   Not on file  Tobacco Use   Smoking status: Never   Smokeless tobacco: Never  Vaping Use   Vaping Use:  Never used  Substance and Sexual Activity   Alcohol use: Yes    Comment: occ    Drug use: No   Sexual activity: Not on file  Other Topics Concern   Not on file  Social History Narrative   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     Social Determinants of Health   Financial Resource Strain: Not on file  Food Insecurity: Not on file  Transportation Needs: Not on file  Physical Activity: Not on file  Stress: Not on file  Social Connections: Not on file  Intimate Partner Violence: Not on file     Allergies  Allergen Reactions   Codeine Hives and Rash   Morphine Hives and Other (See Comments)    Headaches, also   Morphine And Related Hives   Chlorhexidine Hives and Rash          Outpatient Medications Prior to Visit  Medication Sig Dispense Refill   acetaminophen (TYLENOL) 325 MG tablet Take 650  mg by mouth every 6 (six) hours as needed for mild pain, fever or headache.     albuterol (VENTOLIN HFA) 108 (90 Base) MCG/ACT inhaler INHALE 2 PUFFS INTO THE LUNGS EVERY 4 HOURS AS NEEDED FOR WHEEZING. (Patient taking differently: Inhale 2 puffs into the lungs every 4 (four) hours as needed for wheezing.) 8.5 g 0   ALPRAZolam (XANAX) 0.5 MG tablet Take 1 tablet by mouth 2 times daily as needed for anxiety. 45 tablet 2   amoxicillin (AMOXIL) 875 MG tablet Take 1 tablet (875 mg total) by mouth 2 (two) times daily for 10 days. 20 tablet 0   aspirin 81 MG EC tablet Take 1 tablet (81 mg total) by mouth daily. 21 tablet 0   atorvastatin (LIPITOR) 40 MG tablet Take 1 tablet (40 mg total) by mouth daily. 90 tablet 3   azelastine (ASTELIN) 0.1 % nasal spray Place 1 spray into both nostrils 2 (two) times daily. Use in each nostril as directed 30 mL 12   Calcium-Vitamin D-Vitamin K 650-12.5-40 MG-MCG-MCG CHEW Chew 1 tablet by mouth daily.     clopidogrel (PLAVIX) 75 MG tablet Take 1 tablet by mouth daily. 90 tablet 1   cyclobenzaprine (FLEXERIL) 10 MG tablet Take 5-10 mg by mouth at bedtime as needed for muscle spasms.     DULoxetine (CYMBALTA) 60 MG capsule Take 1 capsule by mouth daily. 30 capsule 2   furosemide (LASIX) 20 MG tablet Take 1 tablet by mouth daily as needed. 90 tablet 3   gabapentin (NEURONTIN) 600 MG tablet Take 1 tablet  by mouth at bedtime. 90 tablet 1   Galcanezumab-gnlm (EMGALITY) 120 MG/ML SOSY Inject 120 mg under the skin every 28 days 1 mL 6   HYDROcodone-acetaminophen (NORCO/VICODIN) 5-325 MG tablet Take 1 tablet by mouth 2 (two) times daily as needed 14 tablet 0   HYDROcodone-acetaminophen (NORCO/VICODIN) 5-325 MG tablet Take 1 tablet by mouth once daily as needed 15 tablet 0   loratadine (CLARITIN) 10 MG tablet Take 10 mg by mouth daily.     metoprolol succinate (TOPROL-XL) 25 MG 24 hr tablet Take 1 tablet by mouth daily. 90 tablet 3   Multiple Vitamin (MULTIVITAMIN WITH MINERALS)  TABS tablet Take 1 tablet by mouth daily.     Na Sulfate-K Sulfate-Mg Sulf (SUPREP BOWEL PREP KIT) 17.5-3.13-1.6 GM/177ML SOLN Take 1 kit by mouth as directed. For colonoscopy prep 354 mL 0   OLANZapine-FLUoxetine (SYMBYAX) 6-25 MG capsule Take 1 capsule  by mouth at bedtime. 30 capsule 2   oxybutynin (DITROPAN-XL) 5 MG 24 hr tablet Take 1 (one) tablet by mouth at night for overactive bladder/urinary frequency.  Replaces tolterodine. 30 tablet 2   pantoprazole (PROTONIX) 40 MG tablet Take 1 tablet by mouth daily. 30 tablet 11   senna-docusate (SENOKOT-S) 8.6-50 MG tablet Take 2 tablets by mouth daily.     Ubrogepant (UBRELVY) 100 MG TABS TAKE  1/2 TABLET AT HEADACHE ONSET. MAY REPEAT DOSE IN TWO HOURS IF NO IMPROVEMENT. DO NOT EXCEED MORE THAN TWO TABLETS IN 24 HOURS. (Patient taking differently: Take 50-100 mg by mouth See admin instructions. Take 41m at headache onset, may repeat dose in two hours if no improvement. Do not excess more than 2067min 24 hours) 18 tablet 1   valACYclovir (VALTREX) 500 MG tablet Take 1 tablet (500 mg total) by mouth daily. 90 tablet 1   No facility-administered medications prior to visit.       Objective:   Physical Exam:  General appearance: [age].o., female, NAD, conversant  Eyes: anicteric sclerae, moist conjunctivae; no lid-lag; PERRL, tracking appropriately HENT: NCAT; oropharynx, MMM, no mucosal ulcerations; normal hard and soft palate Neck: Trachea midline; no lymphadenopathy, no JVD Lungs: CTAB, no crackles, no wheeze, with normal respiratory effort CV: RRR, no MRGs  Abdomen: Soft, non-tender; non-distended, BS present  Extremities: No peripheral edema, radial and DP pulses present bilaterally  Skin: Normal temperature, turgor and texture; no rash Psych: Appropriate affect Neuro: Alert and oriented to person and place, no focal deficit    Vitals:   05/04/21 0944  BP: 124/76  Pulse: (!) 123  Temp: 98.6 F (37 C)  TempSrc: Oral  SpO2: 98%   Weight: 225 lb (102.1 kg)  Height: _0  (1.626 m)   98% on RA BMI Readings from Last 3 Encounters:  05/04/21 38.62 kg/m  04/27/21 38.96 kg/m  04/13/21 38.62 kg/m   Wt Readings from Last 3 Encounters:  05/04/21 225 lb (102.1 kg)  04/27/21 227 lb (103 kg)  04/13/21 225 lb (102.1 kg)     CBC    Component Value Date/Time   WBC 7.7 03/29/2021 1747   RBC 4.25 03/29/2021 1747   HGB 12.6 03/29/2021 1747   HGB 12.7 10/27/2020 0934   HCT 39.1 03/29/2021 1747   HCT 39.0 10/27/2020 0934   PLT 200 03/29/2021 1747   PLT 279 10/27/2020 0934   MCV 92.0 03/29/2021 1747   MCV 91 10/27/2020 0934   MCH 29.6 03/29/2021 1747   MCHC 32.2 03/29/2021 1747   RDW 13.3 03/29/2021 1747   RDW 13.4 10/27/2020 0934   LYMPHSABS 2.5 03/29/2021 1747   LYMPHSABS 1.6 10/27/2020 0934   MONOABS 0.6 03/29/2021 1747   EOSABS 0.1 03/29/2021 1747   EOSABS 0.0 10/27/2020 0934   BASOSABS 0.0 03/29/2021 1747   BASOSABS 0.0 10/27/2020 0934     Chest Imaging:  CTA Chest 03/29/21 with PCE, airway thickening, no LAD  Pulmonary Functions Testing Results: No flowsheet data found.    Echocardiogram:   TTE 01/21/21 with LVH, trivial PCE but otherwise normal  STress echo 2019: Slightly submaximal stress not achieving target HR but otherwise signficant only for mild-moderate eccentric MR    Assessment & Plan:   # DOE: Could reflect slow recovery from covid-19, deconditioning following hospitalizations, asthma a possibility as well. She does seem to think she overall feels similar to how she felt when she had pericarditis which was treated in past with colchicine, steroids  and there was trivial PCE on TTE 12/2020 and CTA Chest 03/29/21. Had negative nuke stress in September,.  Plan: - request CXR, nuke stress, discharge summary from [place] in September - PFTs - if PFTs not revealing for asthma discuss with cardiology whether worth treating pericarditis empirically vs obtaining limited echo or other  imaging. PCP has started autoimmune workup.     Maryjane Hurter, MD [facility] 05/04/2021 10:19 AM

## 2021-05-04 ENCOUNTER — Ambulatory Visit (INDEPENDENT_AMBULATORY_CARE_PROVIDER_SITE_OTHER): Payer: 59 | Admitting: Student

## 2021-05-04 ENCOUNTER — Encounter: Payer: Self-pay | Admitting: Student

## 2021-05-04 ENCOUNTER — Other Ambulatory Visit: Payer: Self-pay

## 2021-05-04 ENCOUNTER — Other Ambulatory Visit (HOSPITAL_COMMUNITY): Payer: Self-pay

## 2021-05-04 VITALS — BP 124/76 | HR 123 | Temp 98.6°F | Ht 64.0 in | Wt 225.0 lb

## 2021-05-04 DIAGNOSIS — U099 Post covid-19 condition, unspecified: Secondary | ICD-10-CM | POA: Diagnosis not present

## 2021-05-04 DIAGNOSIS — R0609 Other forms of dyspnea: Secondary | ICD-10-CM | POA: Diagnosis not present

## 2021-05-04 DIAGNOSIS — I3139 Other pericardial effusion (noninflammatory): Secondary | ICD-10-CM | POA: Diagnosis not present

## 2021-05-04 NOTE — Patient Instructions (Signed)
-   Hold off on albuterol inhaler for 2 days before your breathing tests (PFTs) - I'll be in touch afterward to talk about whether we need to do more sensitive testing for asthma vs consider working up/treating pericarditis

## 2021-05-06 ENCOUNTER — Other Ambulatory Visit (HOSPITAL_COMMUNITY): Payer: Self-pay

## 2021-05-08 ENCOUNTER — Other Ambulatory Visit (HOSPITAL_COMMUNITY): Payer: Self-pay

## 2021-05-08 DIAGNOSIS — Z8673 Personal history of transient ischemic attack (TIA), and cerebral infarction without residual deficits: Secondary | ICD-10-CM | POA: Diagnosis not present

## 2021-05-09 ENCOUNTER — Other Ambulatory Visit (HOSPITAL_COMMUNITY): Payer: Self-pay

## 2021-05-10 ENCOUNTER — Ambulatory Visit (INDEPENDENT_AMBULATORY_CARE_PROVIDER_SITE_OTHER): Payer: 59 | Admitting: Clinical

## 2021-05-10 ENCOUNTER — Other Ambulatory Visit: Payer: Self-pay

## 2021-05-10 ENCOUNTER — Other Ambulatory Visit: Payer: Self-pay | Admitting: Nurse Practitioner

## 2021-05-10 DIAGNOSIS — F331 Major depressive disorder, recurrent, moderate: Secondary | ICD-10-CM | POA: Diagnosis not present

## 2021-05-10 DIAGNOSIS — F419 Anxiety disorder, unspecified: Secondary | ICD-10-CM | POA: Diagnosis not present

## 2021-05-10 DIAGNOSIS — F4310 Post-traumatic stress disorder, unspecified: Secondary | ICD-10-CM | POA: Diagnosis not present

## 2021-05-10 DIAGNOSIS — R440 Auditory hallucinations: Secondary | ICD-10-CM | POA: Diagnosis not present

## 2021-05-10 DIAGNOSIS — R441 Visual hallucinations: Secondary | ICD-10-CM

## 2021-05-10 DIAGNOSIS — R7989 Other specified abnormal findings of blood chemistry: Secondary | ICD-10-CM

## 2021-05-10 NOTE — Progress Notes (Signed)
   THERAPIST PROGRESS NOTE  Session Time: 9am  Participation Level: Active  Behavioral Response: Casual and NeatAlertpleasant  Type of Therapy: Individual Therapy  Treatment Goals addressed: Anxiety and Coping  Interventions: CBT  Summary: pt reports she has been experiencing AH/VH at night. Pt says her and husband practice deep breathing exercises to help her when she is feeling overwhelmed. Pt also reports she has had thoughts of wanting to cut herself but denies acting on these thoughts. Pt reports her husband has hidden sharp objects in the home. Pt states speaking with her husband calms her down and reassures her that she is safe. Additionally pt says she has created a prayer room in her home, where she prays/meditates, exercises, etc. Pt states she finds these things beneficial to manage anxiety inducing situations.   Suicidal/Homicidal: Pt reports she has been experiencing AH/VH and thoughts of wanting to cut herself. Pt denies plan, intent or attempts to harm self. Pt states speaking with her husband, praying/meditating, lighting candles, and organizing her home helps calm her and is a healthy distraction. Pt provided with suicide prevention hotline number and encouraged to call 911 or go to closest ED in the event of an emergency.  Therapist Response: CSW processed with pt using cognitive restructuring to challenge irrational beliefs. CSW discussed with pt putting thoughts on trial and prompted her to identify differences between facts vs feelings/opinions. CSW assisted pt in creating a safety plan; identifying protective factors that can assist in keeping her safe during anxiety inducing situations. CSW continues to encourage pt to utilize grounding techniques to manage her anxiety.  Plan: Return again in 2 weeks.  Diagnosis: Axis I:  PTSD                                     Moderate episode major depressive disorder                                     Anxiety    Axis II: No  diagnosis    Yvette Rack, LCSW 05/10/2021

## 2021-05-11 ENCOUNTER — Other Ambulatory Visit (HOSPITAL_COMMUNITY): Payer: Self-pay

## 2021-05-12 ENCOUNTER — Other Ambulatory Visit (HOSPITAL_COMMUNITY): Payer: Self-pay

## 2021-05-18 NOTE — Progress Notes (Signed)
Office Visit Note  Patient: Desiree Russell             Date of Birth: 1971/05/24           MRN: [id number]             PCP: Rip Harbour, NP Referring: Rip Harbour, NP Visit Date: 05/30/2021 Occupation: @GUAROCC @  Subjective:  Pain in multiple joints.   History of Present Illness: Desiree Russell is a 50 y.o. female seen in consultation per request of her PCP for the evaluation of joint pain.  According the patient in 2010 she started experiencing weakness in her legs and frequent falls.  At the time she was seen by neurologist at [facility] who did thorough evaluation including MRI of the brain, spinal tap and EMG nerve conduction velocities.  She was diagnosed with fibromyalgia syndrome.  She was placed on Cymbalta and later gabapentin was added.  She initially noted improvement in her symptoms and then the symptoms recurred.  She states the pain continues to get worse.  She had been diagnosed with degenerative disease of the lumbar spine.  She has chronic discomfort in her entire spine including her cervical thoracic and lumbar spine.  She also complains of discomfort in her shoulders, elbows, wrist and her hands.  She complains of pain in her bilateral hips, knees, ankles and her feet.  She has noticed swelling in her bilateral hands, her legs, ankles and her knees.  She states her joint symptoms have been getting worse in the last 4 to 6 months.  There is family history of rheumatoid arthritis in maternal grandmother.  She is gravida 6, para 4 miscarriage 1, abortion 1.  She gives history of stroke in 2016 which caused right-sided weakness.  It required physical therapy.  She states she had another stroke in June 2022 which also caused right-sided weakness.  She did physical therapy for some time.  She has been followed by neurology.  According the patient and 2019 she developed shortness of breath and had cardiology evaluation.  She also had an echocardiogram.  She was diagnosed  with pericarditis related to flu.  Since then she has had recurrent pericarditis.  She also had a stress test which was normal.  She states she has supraventricular tachycardia and she had an episode of ventricular tachycardia for which she required cardioversion.  She has been followed by Dr. Percival Spanish.  Activities of Daily Living:  Patient reports morning stiffness for 2-3 hours.   Patient Reports nocturnal pain.  Difficulty dressing/grooming: Reports Difficulty climbing stairs: Reports Difficulty getting out of chair: Reports Difficulty using hands for taps, buttons, cutlery, and/or writing: Reports  Review of Systems  Constitutional:  Positive for fatigue.  HENT:  Negative for mouth sores, mouth dryness and nose dryness.   Eyes:  Negative for pain, itching and dryness.  Respiratory:  Positive for shortness of breath. Negative for difficulty breathing.   Cardiovascular:  Negative for chest pain and palpitations.  Gastrointestinal:  Positive for blood in stool and constipation. Negative for diarrhea.  Endocrine: Negative for increased urination.  Genitourinary:  Negative for difficulty urinating.  Musculoskeletal:  Positive for joint pain, joint pain, joint swelling, myalgias, morning stiffness, muscle tenderness and myalgias.  Skin:  Negative for color change, rash and redness.  Allergic/Immunologic: Positive for susceptible to infections.  Neurological:  Positive for numbness, headaches, memory loss and weakness. Negative for dizziness.  Hematological:  Positive for bruising/bleeding tendency.  Psychiatric/Behavioral:  Positive for confusion.  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/22/2021   CVA (cerebral vascular accident) ([place]) 01/20/2021   Compulsive skin picking 10/24/2020   Brain fog 10/24/2020   History of COVID-19 10/24/2020   Cough 10/24/2020    Hemorrhagic cyst of left ovary 11/11/2019   Degeneration of lumbar intervertebral disc 10/26/2019   Intractable migraine with aura without status migrainosus 05/09/2018   Right sided weakness 03/14/2018   Chronic migraine 03/14/2018   Loss of consciousness ([place]) 03/14/2018   Chronic adhesive idiopathic pericarditis 10/15/2017   Panic attacks 10/05/2017   Paroxysmal SVT (supraventricular tachycardia) ([place]) 10/05/2017   Other forms of dyspnea 10/03/2017   Sinus tachycardia 10/03/2017   Episode of recurrent major depressive disorder ([place]) 09/27/2017   Thrombocytopenia ([place]) 09/13/2017   Normocytic normochromic anemia 09/13/2017   Pericardial effusion 09/12/2017   Atypical chest pain 06/18/2016   Palpitations 06/18/2016   Depression 06/13/2016   Pseudoseizures ([place]) 06/08/2016   Seizure ([place]) 06/08/2016   Somatization disorder 06/08/2016   Anxiety 12/28/2015   TIA (transient ischemic attack) 12/28/2015   Tremor 12/28/2015   GAD (generalized anxiety disorder) 12/28/2015   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   Cerebral infarction due to thrombosis of cerebral artery ([place]) 12/26/2012    Past Medical History:  Diagnosis Date   Anxiety    Dyslipidemia    Fibromyalgia    Headache    Pericarditis    Seizures (HCC)    Tachycardia    TIA (transient ischemic attack)    Recurrent    Family History  Problem Relation Age of Onset   Hypertension Mother    Hypertension Father    Heart attack Father 6   Healthy Sister    Healthy Sister    Healthy Sister    Hypertension Brother    Diabetes type II Brother    COPD Brother    Healthy Brother    Healthy Brother    Breast cancer Maternal Aunt    Throat cancer Maternal Uncle    Hypertension Maternal Grandmother    Diabetes Paternal Grandmother    Healthy Son    Healthy Son    Healthy Son    Healthy Daughter    Colon cancer Neg Hx    Pancreatic cancer Neg Hx    Stomach cancer Neg Hx    Liver disease Neg Hx    Past  Surgical History:  Procedure Laterality Date   ABDOMINAL HYSTERECTOMY     CHOLECYSTECTOMY, LAPAROSCOPIC     Social History   Social History Narrative   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     Immunization History  Administered Date(s) Administered   Influenza-Unspecified 05/08/2017   PFIZER(Purple Top)SARS-COV-2 Vaccination 10/31/2019, 11/24/2019     Objective: Vital Signs: BP 128/89 (BP Location: Right Arm, Patient Position: Sitting, Cuff Size: Large)   Pulse (!) 106   Ht 5' 5"  (1.651 m)   Wt 232 lb 6.4 oz (105.4 kg)   BMI 38.67 kg/m    Physical Exam Vitals and nursing note reviewed.  Constitutional:      Appearance: She is well-developed.  HENT:     Head: Normocephalic and atraumatic.  Eyes:     Conjunctiva/sclera: Conjunctivae normal.  Cardiovascular:     Rate and Rhythm: Normal rate and regular rhythm.     Heart sounds: Normal heart sounds.  Pulmonary:     Effort:  Pulmonary effort is normal.     Breath sounds: Normal breath sounds.  Abdominal:     General: Bowel sounds are normal.     Palpations: Abdomen is soft.  Musculoskeletal:     Cervical back: Normal range of motion.  Lymphadenopathy:     Cervical: No cervical adenopathy.  Skin:    General: Skin is warm and dry.     Capillary Refill: Capillary refill takes less than 2 seconds.  Neurological:     Mental Status: She is alert and oriented to person, place, and time.  Psychiatric:        Behavior: Behavior normal.     Musculoskeletal Exam: C-spine was in good range of motion with some discomfort.  Thoracic and lumbar spine was in good range of motion with some discomfort.  She was able to reach her toes without any difficulty.  She complains of discomfort in the lower lumbar region.  Shoulder joints, elbow joints, wrist joints with good range of motion.  She complains of discomfort in her right shoulder joint which was in full range of motion.  She had tenderness over bilateral  lateral epicondyle region.  Wrist joints, MCPs and PIPs and DIPs with good range of motion with no synovitis.  She had tenderness across the PIP joints.  She had discomfort range of motion of her right hip joint over the trochanteric area.  Knee joints in good range of motion without any warmth swelling or effusion.  There is tenderness over ankles and MTPs but no synovitis was noted.  CDAI Exam: CDAI Score: -- Patient Global: --; Provider Global: -- Swollen: --; Tender: -- Joint Exam 05/30/2021   No joint exam has been documented for this visit   There is currently no information documented on the homunculus. Go to the Rheumatology activity and complete the homunculus joint exam.  Investigation: No additional findings.  Imaging: LONG TERM MONITOR (3-14 DAYS)  Result Date: 05/06/2021 Normal sinus rhythm Sinus tachycardia Rare ventricular and supraventricular ectopy No sustained arrhythmias Symptoms of chest pain occurred with normal sinus rhythm   Recent Labs: Lab Results  Component Value Date   WBC 7.7 03/29/2021   HGB 12.6 03/29/2021   PLT 200 03/29/2021   NA 136 03/29/2021   K 3.9 03/29/2021   CL 102 03/29/2021   CO2 26 03/29/2021   GLUCOSE 104 (H) 03/29/2021   BUN 15 03/29/2021   CREATININE 0.91 03/29/2021   BILITOT 0.5 03/29/2021   ALKPHOS 51 03/29/2021   AST 25 03/29/2021   ALT 17 03/29/2021   PROT 7.3 03/29/2021   ALBUMIN 4.2 03/29/2021   CALCIUM 9.6 03/29/2021   GFRAA >60 12/12/2014    January 21, 2021 lupus anticoagulant negative, beta-2 GP 1 negative, anticardiolipin negative, ANA negative  Speciality Comments: No specialty comments available.  Procedures:  No procedures performed Allergies: Codeine, Morphine, Morphine and related, and Chlorhexidine   Assessment / Plan:     Visit Diagnoses: Polyarthralgia -patient gives history of pain in multiple joints since 2010.  She complains of discomfort in her entire spine, shoulders, elbows, wrists, hands, hips,  knees and ankles and feet.  04/13/21: ESR 2, CRP<1, CCP ab 1  Pain in both hands -she complains of pain and discomfort in her bilateral hands.  No synovitis was noted.  She had tenderness across PIP joints.  Plan: XR Hand 2 View Right, XR Hand 2 View Left.  X-rays of bilateral hands consistent with osteoarthritis.  Pain in right hip - History of chronic  right hip pain.  MRI of the right hip joint October 11, 2019 was unremarkable.  She had tenderness on palpation of the right trochanteric area.  Chronic pain of both knees -she complains of pain and discomfort in her bilateral knee joints.  No warmth swelling or effusion was noted.  Plan: XR KNEE 3 VIEW RIGHT, XR KNEE 3 VIEW LEFT, bilateral mild chondromalacia patella was noted.  Sedimentation rate, Rheumatoid factor, Angiotensin converting enzyme  Pain in both feet -she complains of discomfort in her ankles and MTPs which were tender to touch.  No synovitis was noted.  Plan: XR Foot 2 Views Right, XR Foot 2 Views Left.  X-rays were consistent with osteoarthritis.  DDD (degenerative disc disease), cervical -she has chronic neck pain.  MRI was reviewed from November 12, 2019 showed multilevel spondylosis and facet joint arthropathy.  DDD (degenerative disc disease), thoracic -she complains of chronic thoracic pain.  November 12, 2019 MRI of the thoracic spine showed mild degenerative changes.  The MRI findings were reviewed with the patient.  DDD (degenerative disc disease), lumbar -she is ongoing pain and discomfort in her lower back.  MRI March 10, 2021 showed mild degenerative disc disease and facet joint arthropathy.  No spinal stenosis was noted.  The MRI findings were reviewed with the patient.  Thrombocytopenia (HCC)-resolved.  Normocytic normochromic anemia-resolved.  Chronic adhesive idiopathic pericarditis-patient is followed by cardiology.  Pericardial effusion  Paroxysmal SVT (supraventricular tachycardia) (HCC)-patient states she had an  episode of ventricular tachycardia requiring cardioversion.  Dyslipidemia  Cerebral infarction due to thrombosis of cerebral artery (HCC)  TIA (transient ischemic attack)-last episode was in June 2022.  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  Orders: Orders Placed This Encounter  Procedures   XR Hand 2 View Right   XR Hand 2 View Left   XR KNEE 3 VIEW RIGHT   XR KNEE 3 VIEW LEFT   XR Foot 2 Views Right   XR Foot 2 Views Left   Sedimentation rate   Rheumatoid factor   Angiotensin converting enzyme    No orders of the defined types were placed in this encounter.   Follow-Up Instructions: Return for polyarthralgia.   Bo Merino, MD  Note - This record has been created using Editor, commissioning.  Chart creation errors have been sought, but may not always  have been located. Such creation errors do not reflect on  the standard of medical care.

## 2021-05-19 ENCOUNTER — Other Ambulatory Visit (HOSPITAL_COMMUNITY): Payer: Self-pay

## 2021-05-19 DIAGNOSIS — M48062 Spinal stenosis, lumbar region with neurogenic claudication: Secondary | ICD-10-CM | POA: Diagnosis not present

## 2021-05-19 DIAGNOSIS — M545 Low back pain, unspecified: Secondary | ICD-10-CM | POA: Diagnosis not present

## 2021-05-19 DIAGNOSIS — M5431 Sciatica, right side: Secondary | ICD-10-CM | POA: Diagnosis not present

## 2021-05-19 DIAGNOSIS — Z79891 Long term (current) use of opiate analgesic: Secondary | ICD-10-CM | POA: Diagnosis not present

## 2021-05-19 DIAGNOSIS — M5136 Other intervertebral disc degeneration, lumbar region: Secondary | ICD-10-CM | POA: Diagnosis not present

## 2021-05-19 DIAGNOSIS — M542 Cervicalgia: Secondary | ICD-10-CM | POA: Diagnosis not present

## 2021-05-19 DIAGNOSIS — G8929 Other chronic pain: Secondary | ICD-10-CM | POA: Diagnosis not present

## 2021-05-19 DIAGNOSIS — M5432 Sciatica, left side: Secondary | ICD-10-CM | POA: Diagnosis not present

## 2021-05-19 DIAGNOSIS — G894 Chronic pain syndrome: Secondary | ICD-10-CM | POA: Diagnosis not present

## 2021-05-19 MED ORDER — HYDROCODONE-ACETAMINOPHEN 7.5-325 MG PO TABS
ORAL_TABLET | ORAL | 0 refills | Status: DC
  Filled 2021-05-19: qty 75, 30d supply, fill #0

## 2021-05-22 ENCOUNTER — Institutional Professional Consult (permissible substitution): Payer: 59 | Admitting: Pulmonary Disease

## 2021-05-22 ENCOUNTER — Other Ambulatory Visit (HOSPITAL_COMMUNITY): Payer: Self-pay

## 2021-05-22 ENCOUNTER — Encounter: Payer: Self-pay | Admitting: Nurse Practitioner

## 2021-05-22 DIAGNOSIS — R310 Gross hematuria: Secondary | ICD-10-CM | POA: Diagnosis not present

## 2021-05-22 DIAGNOSIS — N3289 Other specified disorders of bladder: Secondary | ICD-10-CM | POA: Diagnosis not present

## 2021-05-22 DIAGNOSIS — R350 Frequency of micturition: Secondary | ICD-10-CM | POA: Diagnosis not present

## 2021-05-22 DIAGNOSIS — N390 Urinary tract infection, site not specified: Secondary | ICD-10-CM | POA: Diagnosis not present

## 2021-05-22 MED ORDER — OXYBUTYNIN CHLORIDE ER 5 MG PO TB24
ORAL_TABLET | ORAL | 1 refills | Status: DC
  Filled 2021-05-22 – 2021-06-11 (×2): qty 90, 90d supply, fill #0

## 2021-05-22 MED ORDER — CEPHALEXIN 250 MG PO CAPS
ORAL_CAPSULE | ORAL | 3 refills | Status: DC
  Filled 2021-05-22 – 2021-12-06 (×2): qty 30, 30d supply, fill #0

## 2021-05-22 NOTE — Telephone Encounter (Signed)
Cardiac clearance sent to [place] clinic number [phone]

## 2021-05-24 ENCOUNTER — Encounter: Payer: Self-pay | Admitting: Nurse Practitioner

## 2021-05-25 ENCOUNTER — Ambulatory Visit (INDEPENDENT_AMBULATORY_CARE_PROVIDER_SITE_OTHER): Payer: 59 | Admitting: Clinical

## 2021-05-25 ENCOUNTER — Other Ambulatory Visit: Payer: Self-pay

## 2021-05-25 ENCOUNTER — Encounter: Payer: Self-pay | Admitting: Nurse Practitioner

## 2021-05-25 DIAGNOSIS — F331 Major depressive disorder, recurrent, moderate: Secondary | ICD-10-CM | POA: Diagnosis not present

## 2021-05-25 DIAGNOSIS — F4310 Post-traumatic stress disorder, unspecified: Secondary | ICD-10-CM

## 2021-05-25 DIAGNOSIS — F419 Anxiety disorder, unspecified: Secondary | ICD-10-CM | POA: Diagnosis not present

## 2021-05-25 NOTE — Progress Notes (Signed)
   THERAPIST PROGRESS NOTE  Session Time: 11am  Participation Level: Active  Behavioral Response: Casual and NeatAlertpleasant  Type of Therapy: Individual Therapy  Treatment Goals addressed: Anxiety and Coping  Interventions: DBT Virtual Visit via Video Note  I connected with Desiree Russell on 05/25/21 at 11:00 AM EDT by a video enabled telemedicine application and verified that I am speaking with the correct person using two identifiers.  Location: Patient: home Provider: office   I discussed the limitations of evaluation and management by telemedicine and the availability of in person appointments. The patient expressed understanding and agreed to proceed.   I discussed the assessment and treatment plan with the patient. The patient was provided an opportunity to ask questions and all were answered. The patient agreed with the plan and demonstrated an understanding of the instructions.   The patient was advised to call back or seek an in-person evaluation if the symptoms worsen or if the condition fails to improve as anticipated.  I provided 50 minutes of non-face-to-face time during this encounter.  Summary: Pt presents in a pleasant manner today. Pt says her husband gifted her with a peloton bike for her birthday. Pt says she uses it daily and reports physical exercise helps relieve her stress. Pt reports she is staying connected to family member, reporting going shopping with her daughter. Pt states experiencing minimal anxiety when she is with others. Pt discussed physical health concerns(fibromyalgia) and states going to pain clinic for pain management. Pt reports implementing a self care routine that consist of reciting daily affirmations, prayer/meditation, exercise. Pt identifies her daughter and husband as protective factors.  Suicidal/Homicidal: Pt reports she has been experiencing self harming thoughts(thoughts of cutting self). Pt describes the thoughts of cutting herself  as fleeting and denies any attempt to harm herself or others. Pt denies plan, intent or attempts to harm self. Pt states speaking with her husband, praying/meditating, lighting candles, and organizing her home helps calm her and is a healthy distraction. Pt provided with suicide prevention hotline number and encouraged to call 911 or go to closest ED in the event of an emergency.  Therapist Response: CSW reviewed and provided pt with information on leaves on a stream to introduce separating herself from unwanted thoughts/emotions. Processed with pt managing unwanted thoughts and discussed opposite action to change emotion. Reviewed with pt self care routine and coping strategies to manage anxiety.  Plan: Return again in 2 weeks.  Diagnosis: Axis I:  PTSD                                     Moderate episode major depressive disorder                                     Anxiety    Axis II: No diagnosis    Yvette Rack, LCSW 05/25/2021

## 2021-05-25 NOTE — Progress Notes (Deleted)
   THERAPIST PROGRESS NOTE  Session Time: ***  Participation Level: {BHH PARTICIPATION LEVEL:22264}  Behavioral Response: {Appearance:22683}{BHH LEVEL OF CONSCIOUSNESS:22305}{BHH MOOD:22306}  Type of Therapy: {CHL AMB BH Type of Therapy:21022741}  Treatment Goals addressed: {CHL AMB BH Treatment Goals Addressed:21022754}  Interventions: {CHL AMB BH Type of Intervention:21022753}  Summary: Desiree Russell is a 50 y.o. female who presents with ***.   Suicidal/Homicidal: {BHH YES OR NO:22294}{yes/no/with/without intent/plan:22693}  Therapist Response: ***  Plan: Return again in *** weeks.  Diagnosis: Axis I: {psych axis 1:31909}    Axis II: {psych axis 2:31910}    Yvette Rack, LCSW 05/25/2021

## 2021-05-30 ENCOUNTER — Encounter: Payer: Self-pay | Admitting: Rheumatology

## 2021-05-30 ENCOUNTER — Ambulatory Visit: Payer: Self-pay

## 2021-05-30 ENCOUNTER — Other Ambulatory Visit (HOSPITAL_COMMUNITY): Payer: Self-pay

## 2021-05-30 ENCOUNTER — Encounter: Payer: Self-pay | Admitting: Nurse Practitioner

## 2021-05-30 ENCOUNTER — Other Ambulatory Visit: Payer: Self-pay

## 2021-05-30 ENCOUNTER — Ambulatory Visit (INDEPENDENT_AMBULATORY_CARE_PROVIDER_SITE_OTHER): Payer: 59 | Admitting: Rheumatology

## 2021-05-30 VITALS — BP 128/89 | HR 106 | Ht 65.0 in | Wt 232.4 lb

## 2021-05-30 DIAGNOSIS — R569 Unspecified convulsions: Secondary | ICD-10-CM

## 2021-05-30 DIAGNOSIS — I310 Chronic adhesive pericarditis: Secondary | ICD-10-CM

## 2021-05-30 DIAGNOSIS — M25551 Pain in right hip: Secondary | ICD-10-CM | POA: Diagnosis not present

## 2021-05-30 DIAGNOSIS — M5030 Other cervical disc degeneration, unspecified cervical region: Secondary | ICD-10-CM

## 2021-05-30 DIAGNOSIS — M79641 Pain in right hand: Secondary | ICD-10-CM

## 2021-05-30 DIAGNOSIS — M25561 Pain in right knee: Secondary | ICD-10-CM | POA: Diagnosis not present

## 2021-05-30 DIAGNOSIS — M5136 Other intervertebral disc degeneration, lumbar region: Secondary | ICD-10-CM | POA: Diagnosis not present

## 2021-05-30 DIAGNOSIS — G619 Inflammatory polyneuropathy, unspecified: Secondary | ICD-10-CM

## 2021-05-30 DIAGNOSIS — F060 Psychotic disorder with hallucinations due to known physiological condition: Secondary | ICD-10-CM

## 2021-05-30 DIAGNOSIS — G43119 Migraine with aura, intractable, without status migrainosus: Secondary | ICD-10-CM

## 2021-05-30 DIAGNOSIS — F410 Panic disorder [episodic paroxysmal anxiety] without agoraphobia: Secondary | ICD-10-CM

## 2021-05-30 DIAGNOSIS — M5134 Other intervertebral disc degeneration, thoracic region: Secondary | ICD-10-CM

## 2021-05-30 DIAGNOSIS — M79642 Pain in left hand: Secondary | ICD-10-CM | POA: Diagnosis not present

## 2021-05-30 DIAGNOSIS — I6330 Cerebral infarction due to thrombosis of unspecified cerebral artery: Secondary | ICD-10-CM

## 2021-05-30 DIAGNOSIS — M25562 Pain in left knee: Secondary | ICD-10-CM | POA: Diagnosis not present

## 2021-05-30 DIAGNOSIS — G459 Transient cerebral ischemic attack, unspecified: Secondary | ICD-10-CM

## 2021-05-30 DIAGNOSIS — Z820 Family history of epilepsy and other diseases of the nervous system: Secondary | ICD-10-CM

## 2021-05-30 DIAGNOSIS — M79671 Pain in right foot: Secondary | ICD-10-CM

## 2021-05-30 DIAGNOSIS — Z8261 Family history of arthritis: Secondary | ICD-10-CM

## 2021-05-30 DIAGNOSIS — M79672 Pain in left foot: Secondary | ICD-10-CM | POA: Diagnosis not present

## 2021-05-30 DIAGNOSIS — D696 Thrombocytopenia, unspecified: Secondary | ICD-10-CM

## 2021-05-30 DIAGNOSIS — G8929 Other chronic pain: Secondary | ICD-10-CM | POA: Diagnosis not present

## 2021-05-30 DIAGNOSIS — Z8616 Personal history of COVID-19: Secondary | ICD-10-CM

## 2021-05-30 DIAGNOSIS — F450 Somatization disorder: Secondary | ICD-10-CM

## 2021-05-30 DIAGNOSIS — G451 Carotid artery syndrome (hemispheric): Secondary | ICD-10-CM

## 2021-05-30 DIAGNOSIS — E785 Hyperlipidemia, unspecified: Secondary | ICD-10-CM

## 2021-05-30 DIAGNOSIS — M2550 Pain in unspecified joint: Secondary | ICD-10-CM

## 2021-05-30 DIAGNOSIS — R251 Tremor, unspecified: Secondary | ICD-10-CM

## 2021-05-30 DIAGNOSIS — N83202 Unspecified ovarian cyst, left side: Secondary | ICD-10-CM

## 2021-05-30 DIAGNOSIS — I471 Supraventricular tachycardia: Secondary | ICD-10-CM

## 2021-05-30 DIAGNOSIS — I3139 Other pericardial effusion (noninflammatory): Secondary | ICD-10-CM

## 2021-05-30 DIAGNOSIS — D649 Anemia, unspecified: Secondary | ICD-10-CM

## 2021-05-30 NOTE — Patient Instructions (Signed)
Knee Exercises Ask your health care provider which exercises are safe for you. Do exercises exactly as told by your health care provider and adjust them as directed. It is normal to feel mild stretching, pulling, tightness, or discomfort as you do these exercises. Stop right away if you feel sudden pain or your pain gets worse. Do not begin these exercises until told by your health care provider. Stretching and range-of-motion exercises These exercises warm up your muscles and joints and improve the movement and flexibility of your knee. These exercises also help to relieve pain and swelling. Knee extension, prone Lie on your abdomen (prone position) on a bed. Place your left / right knee just beyond the edge of the surface so your knee is not on the bed. You can put a towel under your left / right thigh just above your kneecap for comfort. Relax your leg muscles and allow gravity to straighten your knee (extension). You should feel a stretch behind your left / right knee. Hold this position for __________ seconds. Scoot up so your knee is supported between repetitions. Repeat __________ times. Complete this exercise __________ times a day. Knee flexion, active  Lie on your back with both legs straight. If this causes back discomfort, bend your left / right knee so your foot is flat on the floor. Slowly slide your left / right heel back toward your buttocks. Stop when you feel a gentle stretch in the front of your knee or thigh (flexion). Hold this position for __________ seconds. Slowly slide your left / right heel back to the starting position. Repeat __________ times. Complete this exercise __________ times a day. Quadriceps stretch, prone  Lie on your abdomen on a firm surface, such as a bed or padded floor. Bend your left / right knee and hold your ankle. If you cannot reach your ankle or pant leg, loop a belt around your foot and grab the belt instead. Gently pull your heel toward your  buttocks. Your knee should not slide out to the side. You should feel a stretch in the front of your thigh and knee (quadriceps). Hold this position for __________ seconds. Repeat __________ times. Complete this exercise __________ times a day. Hamstring, supine Lie on your back (supine position). Loop a belt or towel over the ball of your left / right foot. The ball of your foot is on the walking surface, right under your toes. Straighten your left / right knee and slowly pull on the belt to raise your leg until you feel a gentle stretch behind your knee (hamstring). Do not let your knee bend while you do this. Keep your other leg flat on the floor. Hold this position for __________ seconds. Repeat __________ times. Complete this exercise __________ times a day. Strengthening exercises These exercises build strength and endurance in your knee. Endurance is the ability to use your muscles for a long time, even after they get tired. Quadriceps, isometric This exercise stretches the muscles in front of your thigh (quadriceps) without moving your knee joint (isometric). Lie on your back with your left / right leg extended and your other knee bent. Put a rolled towel or small pillow under your knee if told by your health care provider. Slowly tense the muscles in the front of your left / right thigh. You should see your kneecap slide up toward your hip or see increased dimpling just above the knee. This motion will push the back of the knee toward the floor. For __________   seconds, hold the muscle as tight as you can without increasing your pain. Relax the muscles slowly and completely. Repeat __________ times. Complete this exercise __________ times a day. Straight leg raises This exercise stretches the muscles in front of your thigh (quadriceps) and the muscles that move your hips (hip flexors). Lie on your back with your left / right leg extended and your other knee bent. Tense the muscles in  the front of your left / right thigh. You should see your kneecap slide up or see increased dimpling just above the knee. Your thigh may even shake a bit. Keep these muscles tight as you raise your leg 4-6 inches (10-15 cm) off the floor. Do not let your knee bend. Hold this position for __________ seconds. Keep these muscles tense as you lower your leg. Relax your muscles slowly and completely after each repetition. Repeat __________ times. Complete this exercise __________ times a day. Hamstring, isometric Lie on your back on a firm surface. Bend your left / right knee about __________ degrees. Dig your left / right heel into the surface as if you are trying to pull it toward your buttocks. Tighten the muscles in the back of your thighs (hamstring) to "dig" as hard as you can without increasing any pain. Hold this position for __________ seconds. Release the tension gradually and allow your muscles to relax completely for __________ seconds after each repetition. Repeat __________ times. Complete this exercise __________ times a day. Hamstring curls If told by your health care provider, do this exercise while wearing ankle weights. Begin with __________ lb weights. Then increase the weight by 1 lb (0.5 kg) increments. Do not wear ankle weights that are more than __________ lb. Lie on your abdomen with your legs straight. Bend your left / right knee as far as you can without feeling pain. Keep your hips flat against the floor. Hold this position for __________ seconds. Slowly lower your leg to the starting position. Repeat __________ times. Complete this exercise __________ times a day. Squats This exercise strengthens the muscles in front of your thigh and knee (quadriceps). Stand in front of a table, with your feet and knees pointing straight ahead. You may rest your hands on the table for balance but not for support. Slowly bend your knees and lower your hips like you are going to sit in a  chair. Keep your weight over your heels, not over your toes. Keep your lower legs upright so they are parallel with the table legs. Do not let your hips go lower than your knees. Do not bend lower than told by your health care provider. If your knee pain increases, do not bend as low. Hold the squat position for __________ seconds. Slowly push with your legs to return to standing. Do not use your hands to pull yourself to standing. Repeat __________ times. Complete this exercise __________ times a day. Wall slides This exercise strengthens the muscles in front of your thigh and knee (quadriceps). Lean your back against a smooth wall or door, and walk your feet out 18-24 inches (46-61 cm) from it. Place your feet hip-width apart. Slowly slide down the wall or door until your knees bend __________ degrees. Keep your knees over your heels, not over your toes. Keep your knees in line with your hips. Hold this position for __________ seconds. Repeat __________ times. Complete this exercise __________ times a day. Straight leg raises This exercise strengthens the muscles that rotate the leg at the hip and   move it away from your body (hip abductors). Lie on your side with your left / right leg in the top position. Lie so your head, shoulder, knee, and hip line up. You may bend your bottom knee to help you keep your balance. Roll your hips slightly forward so your hips are stacked directly over each other and your left / right knee is facing forward. Leading with your heel, lift your top leg 4-6 inches (10-15 cm). You should feel the muscles in your outer hip lifting. Do not let your foot drift forward. Do not let your knee roll toward the ceiling. Hold this position for __________ seconds. Slowly return your leg to the starting position. Let your muscles relax completely after each repetition. Repeat __________ times. Complete this exercise __________ times a day. Straight leg raises This  exercise stretches the muscles that move your hips away from the front of the pelvis (hip extensors). Lie on your abdomen on a firm surface. You can put a pillow under your hips if that is more comfortable. Tense the muscles in your buttocks and lift your left / right leg about 4-6 inches (10-15 cm). Keep your knee straight as you lift your leg. Hold this position for __________ seconds. Slowly lower your leg to the starting position. Let your leg relax completely after each repetition. Repeat __________ times. Complete this exercise __________ times a day. This information is not intended to replace advice given to you by your health care provider. Make sure you discuss any questions you have with your health care provider. Document Revised: 05/06/2018 Document Reviewed: 05/06/2018 Elsevier Patient Education  [place] Exercises Hand exercises can be helpful for almost anyone. These exercises can strengthen the hands, improve flexibility and movement, and increase blood flow to the hands. These results can make work and daily tasks easier. Hand exercises can be especially helpful for people who have joint pain from arthritis or have nerve damage from overuse (carpal tunnel syndrome). These exercises can also help people who have injured a hand. Exercises Most of these hand exercises are gentle stretching and motion exercises. It is usually safe to do them often throughout the day. Warming up your hands before exercise may help to reduce stiffness. You can do this with gentle massage or by placing your hands in warm water for 10-15 minutes. It is normal to feel some stretching, pulling, tightness, or mild discomfort as you begin new exercises. This will gradually improve. Stop an exercise right away if you feel sudden, severe pain or your pain gets worse. Ask your health care provider which exercises are best for you. Knuckle bend or "claw" fist  Stand or sit with your arm, hand,  and all five fingers pointed straight up. Make sure to keep your wrist straight during the exercise. Gently bend your fingers down toward your palm until the tips of your fingers are touching the top of your palm. Keep your big knuckle straight and just bend the small knuckles in your fingers. Hold this position for __________ seconds. Straighten (extend) your fingers back to the starting position. Repeat this exercise 5-10 times with each hand. Full finger fist  Stand or sit with your arm, hand, and all five fingers pointed straight up. Make sure to keep your wrist straight during the exercise. Gently bend your fingers into your palm until the tips of your fingers are touching the middle of your palm. Hold this position for __________ seconds. Extend your fingers back to the  starting position, stretching every joint fully. Repeat this exercise 5-10 times with each hand. Straight fist Stand or sit with your arm, hand, and all five fingers pointed straight up. Make sure to keep your wrist straight during the exercise. Gently bend your fingers at the big knuckle, where your fingers meet your hand, and the middle knuckle. Keep the knuckle at the tips of your fingers straight and try to touch the bottom of your palm. Hold this position for __________ seconds. Extend your fingers back to the starting position, stretching every joint fully. Repeat this exercise 5-10 times with each hand. Tabletop  Stand or sit with your arm, hand, and all five fingers pointed straight up. Make sure to keep your wrist straight during the exercise. Gently bend your fingers at the big knuckle, where your fingers meet your hand, as far down as you can while keeping the small knuckles in your fingers straight. Think of forming a tabletop with your fingers. Hold this position for __________ seconds. Extend your fingers back to the starting position, stretching every joint fully. Repeat this exercise 5-10 times with each  hand. Finger spread  Place your hand flat on a table with your palm facing down. Make sure your wrist stays straight as you do this exercise. Spread your fingers and thumb apart from each other as far as you can until you feel a gentle stretch. Hold this position for __________ seconds. Bring your fingers and thumb tight together again. Hold this position for __________ seconds. Repeat this exercise 5-10 times with each hand. Making circles  Stand or sit with your arm, hand, and all five fingers pointed straight up. Make sure to keep your wrist straight during the exercise. Make a circle by touching the tip of your thumb to the tip of your index finger. Hold for __________ seconds. Then open your hand wide. Repeat this motion with your thumb and each finger on your hand. Repeat this exercise 5-10 times with each hand. Thumb motion  Sit with your forearm resting on a table and your wrist straight. Your thumb should be facing up toward the ceiling. Keep your fingers relaxed as you move your thumb. Lift your thumb up as high as you can toward the ceiling. Hold for __________ seconds. Bend your thumb across your palm as far as you can, reaching the tip of your thumb for the small finger (pinkie) side of your palm. Hold for __________ seconds. Repeat this exercise 5-10 times with each hand. Grip strengthening  Hold a stress ball or other soft ball in the middle of your hand. Slowly increase the pressure, squeezing the ball as much as you can without causing pain. Think of bringing the tips of your fingers into the middle of your palm. All of your finger joints should bend when doing this exercise. Hold your squeeze for __________ seconds, then relax. Repeat this exercise 5-10 times with each hand. Contact a health care provider if: Your hand pain or discomfort gets much worse when you do an exercise. Your hand pain or discomfort does not improve within 2 hours after you exercise. If you have  any of these problems, stop doing these exercises right away. Do not do them again unless your health care provider says that you can. Get help right away if: You develop sudden, severe hand pain or swelling. If this happens, stop doing these exercises right away. Do not do them again unless your health care provider says that you can. This information is  not intended to replace advice given to you by your health care provider. Make sure you discuss any questions you have with your health care provider. Document Revised: 11/03/2020 Document Reviewed: 11/03/2020 Elsevier Patient Education  [place].

## 2021-05-31 ENCOUNTER — Ambulatory Visit (INDEPENDENT_AMBULATORY_CARE_PROVIDER_SITE_OTHER): Payer: 59 | Admitting: Student

## 2021-05-31 ENCOUNTER — Other Ambulatory Visit (HOSPITAL_COMMUNITY): Payer: Self-pay

## 2021-05-31 ENCOUNTER — Telehealth (HOSPITAL_COMMUNITY): Payer: Self-pay

## 2021-05-31 DIAGNOSIS — R0609 Other forms of dyspnea: Secondary | ICD-10-CM

## 2021-05-31 DIAGNOSIS — R441 Visual hallucinations: Secondary | ICD-10-CM

## 2021-05-31 LAB — PULMONARY FUNCTION TEST
DL/VA % pred: 120 %
DL/VA: 5.2 ml/min/mmHg/L
DLCO cor % pred: 88 %
DLCO cor: 18.83 ml/min/mmHg
DLCO unc % pred: 88 %
DLCO unc: 18.83 ml/min/mmHg
FEF 25-75 Post: 3.51 L/sec
FEF 25-75 Pre: 3.01 L/sec
FEF2575-%Change-Post: 16 %
FEF2575-%Pred-Post: 138 %
FEF2575-%Pred-Pre: 118 %
FEV1-%Change-Post: 3 %
FEV1-%Pred-Post: 99 %
FEV1-%Pred-Pre: 96 %
FEV1-Post: 2.36 L
FEV1-Pre: 2.28 L
FEV1FVC-%Change-Post: 2 %
FEV1FVC-%Pred-Pre: 104 %
FEV6-%Change-Post: 1 %
FEV6-%Pred-Post: 93 %
FEV6-%Pred-Pre: 92 %
FEV6-Post: 2.68 L
FEV6-Pre: 2.65 L
FEV6FVC-%Pred-Post: 102 %
FEV6FVC-%Pred-Pre: 102 %
FVC-%Change-Post: 1 %
FVC-%Pred-Post: 91 %
FVC-%Pred-Pre: 90 %
FVC-Post: 2.68 L
FVC-Pre: 2.65 L
Post FEV1/FVC ratio: 88 %
Post FEV6/FVC ratio: 100 %
Pre FEV1/FVC ratio: 86 %
Pre FEV6/FVC Ratio: 100 %
RV % pred: 67 %
RV: 1.18 L
TLC % pred: 73 %
TLC: 3.7 L

## 2021-05-31 LAB — RHEUMATOID FACTOR: Rheumatoid fact SerPl-aCnc: <14 IU/mL (ref <14)

## 2021-05-31 LAB — ANGIOTENSIN CONVERTING ENZYME: Angiotensin-Converting Enzyme: 60 U/L (ref 9–67)

## 2021-05-31 LAB — SEDIMENTATION RATE: Sed Rate: 2 mm/h (ref 0–20)

## 2021-05-31 MED ORDER — OLANZAPINE-FLUOXETINE HCL 3-25 MG PO CAPS
1.0000 | ORAL_CAPSULE | Freq: Every evening | ORAL | 0 refills | Status: DC
  Filled 2021-05-31: qty 30, 30d supply, fill #0

## 2021-05-31 NOTE — Telephone Encounter (Addendum)
Dr Lyndel Safe per [facility] "We recommend waiting 6 months from stroke to come off Plavix.At the 6 month point she is clear 5 days is fine."  Per this recommendation does this means that she needs to cancel her procedure? I spoke to patient and she said her last stroke was in June. Please advise on this  LVM for patient.

## 2021-05-31 NOTE — Telephone Encounter (Signed)
She is doing very well on combination of Symbyax and Cymbalta.  I am reluctant to cut down both medication however we can try reducing the dose of Symbyax from 6-25 mg a day to 3-25 milligrams.  I called the new prescription to her pharmacy.  She need to monitor closely her symptoms and if remains stable then we will consider slowly cutting down other medication.

## 2021-05-31 NOTE — Telephone Encounter (Addendum)
Patient called requesting to have the dosage decreased on her Duloxetine (Cymbalta) 60mg  and wants to ween off it. Patient also requested to have the dosage decreased on her Symbyax 6-25mg  because she stated that she's gained excessive weight over the past month. Please review and advise. Thank you  Notified Patient - LVM

## 2021-05-31 NOTE — Progress Notes (Signed)
Full PFT performed today. °

## 2021-05-31 NOTE — Patient Instructions (Signed)
Full PFT performed today. °

## 2021-05-31 NOTE — Progress Notes (Signed)
All the labs are within normal limits.  I will discuss results at the follow-up visit.

## 2021-06-01 ENCOUNTER — Other Ambulatory Visit (HOSPITAL_COMMUNITY): Payer: Self-pay

## 2021-06-02 ENCOUNTER — Other Ambulatory Visit: Payer: Self-pay

## 2021-06-02 ENCOUNTER — Encounter (HOSPITAL_COMMUNITY): Payer: Self-pay | Admitting: Psychiatry

## 2021-06-02 ENCOUNTER — Other Ambulatory Visit (HOSPITAL_COMMUNITY): Payer: Self-pay

## 2021-06-02 ENCOUNTER — Telehealth (HOSPITAL_BASED_OUTPATIENT_CLINIC_OR_DEPARTMENT_OTHER): Payer: 59 | Admitting: Psychiatry

## 2021-06-02 VITALS — Wt 232.0 lb

## 2021-06-02 DIAGNOSIS — F331 Major depressive disorder, recurrent, moderate: Secondary | ICD-10-CM

## 2021-06-02 DIAGNOSIS — K2100 Gastro-esophageal reflux disease with esophagitis, without bleeding: Secondary | ICD-10-CM

## 2021-06-02 DIAGNOSIS — F419 Anxiety disorder, unspecified: Secondary | ICD-10-CM

## 2021-06-02 DIAGNOSIS — F4310 Post-traumatic stress disorder, unspecified: Secondary | ICD-10-CM | POA: Diagnosis not present

## 2021-06-02 DIAGNOSIS — R441 Visual hallucinations: Secondary | ICD-10-CM

## 2021-06-02 DIAGNOSIS — K581 Irritable bowel syndrome with constipation: Secondary | ICD-10-CM

## 2021-06-02 MED ORDER — VORTIOXETINE HBR 10 MG PO TABS: ORAL_TABLET | ORAL | 0 refills | Status: DC

## 2021-06-02 MED ORDER — DULOXETINE HCL 30 MG PO CPEP
ORAL_CAPSULE | ORAL | 0 refills | Status: DC
  Filled 2021-06-02: qty 30, 30d supply, fill #0

## 2021-06-02 NOTE — Telephone Encounter (Signed)
Pt scheduled for an EGD/COLON at the [facility] with Dr. Lyndel Safe on 08/10/2021 @ 9:30 AM. Pt to arrive by 8:30 AM. Pt to hold Plavix 5 days Before procedure. Pt MADE AWARE  Ambulatory Referral GI sent:  Letter Created with updated Instructions for prep sent to pt via My Chart and Mail. Pt made aware.  Pt verbalized understanding with all questions answered.

## 2021-06-02 NOTE — Telephone Encounter (Signed)
Agreed Wait until 6 months after stroke I think January 2023 would be okay Hold Plavix 5 days before Please schedule accordingly RG
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
## 2021-06-05 ENCOUNTER — Encounter: Payer: Self-pay | Admitting: Nurse Practitioner

## 2021-06-06 ENCOUNTER — Telehealth: Payer: Self-pay | Admitting: Student

## 2021-06-06 ENCOUNTER — Encounter: Payer: Self-pay | Admitting: Nurse Practitioner

## 2021-06-06 NOTE — Telephone Encounter (Signed)
NM please advise.  Pt is wanting to get the results of her PFT    thanks

## 2021-06-07 ENCOUNTER — Telehealth (HOSPITAL_COMMUNITY): Payer: 59 | Admitting: Psychiatry

## 2021-06-07 ENCOUNTER — Ambulatory Visit (INDEPENDENT_AMBULATORY_CARE_PROVIDER_SITE_OTHER): Payer: 59 | Admitting: Clinical

## 2021-06-07 ENCOUNTER — Other Ambulatory Visit: Payer: Self-pay

## 2021-06-07 DIAGNOSIS — F419 Anxiety disorder, unspecified: Secondary | ICD-10-CM

## 2021-06-07 DIAGNOSIS — F331 Major depressive disorder, recurrent, moderate: Secondary | ICD-10-CM

## 2021-06-07 DIAGNOSIS — F4310 Post-traumatic stress disorder, unspecified: Secondary | ICD-10-CM

## 2021-06-07 NOTE — Progress Notes (Signed)
   THERAPIST PROGRESS NOTE  Session Time: 9am  Participation Level: Active  Behavioral Response: Casual and NeatAlertEuthymic  Type of Therapy: Individual Therapy  Treatment Goals addressed: Coping  Interventions: CBT Virtual Visit via Video Note  I connected with [name] on 06/07/21 at  9:00 AM EST by a video enabled telemedicine application and verified that I am speaking with the correct person using two identifiers.  Location: Patient: home Provider: office   I discussed the limitations of evaluation and management by telemedicine and the availability of in person appointments. The patient expressed understanding and agreed to proceed.   I discussed the assessment and treatment plan with the patient. The patient was provided an opportunity to ask questions and all were answered. The patient agreed with the plan and demonstrated an understanding of the instructions.   The patient was advised to call back or seek an in-person evaluation if the symptoms worsen or if the condition fails to improve as anticipated.  I provided 50 minutes of non-face-to-face time during this encounter.  Summary: Pt presents in a euthymic mood and reports back and joint pain has subsided since she stopped taking Lipitor medication. Pt reports psychiatrist is weaning her off Cymbalta medication. Pt discussed weight gain from prescribed medications. Pt states she has become more active, using her peloton bike and treadmill more frequently. Additionally pt reports she does routine meditation/stretch exercises and pilates. Pt discussed improvement in overall health and says she is scheduled to return to work on 11/14. Pt demonstrates  good insight and making progress toward achieving treatment goals.  Suicidal/Homicidal: Pt denies SI/HI. Pt denies any psychosis and no thoughts of self harm reported. Pt encouraged to call 911 or go to closest ED in the event of an emergency.  Therapist Response: CSW  provided pt with information on the cognitive triangle. Discussed with pt how thoughts, emotions and behaviors affect one another. Role played with pt challenging irrational beliefs by putting thoughts on trial. Processed with pt mental health benefits of physical exercise and encourages her to continue practicing self care routine to manage life stressors.  Plan: Return again in 2 weeks.  Diagnosis: Axis I:  PTSD                                     Moderate episode major depressive disorder                                     Anxiety    Axis II: No diagnosis    Yvette Rack, LCSW 06/07/2021

## 2021-06-08 ENCOUNTER — Encounter: Payer: 59 | Admitting: Gastroenterology

## 2021-06-11 ENCOUNTER — Other Ambulatory Visit (HOSPITAL_COMMUNITY): Payer: Self-pay

## 2021-06-12 ENCOUNTER — Other Ambulatory Visit (HOSPITAL_COMMUNITY): Payer: Self-pay

## 2021-06-12 ENCOUNTER — Ambulatory Visit: Payer: 59 | Admitting: Nurse Practitioner

## 2021-06-14 NOTE — Progress Notes (Deleted)
Office Visit Note  Patient: Desiree Russell             Date of Birth: 11/05/1970           MRN: [id number]             PCP: Rip Harbour, NP Referring: Rip Harbour, NP Visit Date: 06/27/2021 Occupation: @GUAROCC @  Subjective:  No chief complaint on file.   History of Present Illness: Desiree Russell is a 50 y.o. female ***   Activities of Daily Living:  Patient reports morning stiffness for *** {minute/hour:19697}.   Patient {ACTIONS;DENIES/REPORTS:21021675::"Denies"} nocturnal pain.  Difficulty dressing/grooming: {ACTIONS;DENIES/REPORTS:21021675::"Denies"} Difficulty climbing stairs: {ACTIONS;DENIES/REPORTS:21021675::"Denies"} Difficulty getting out of chair: {ACTIONS;DENIES/REPORTS:21021675::"Denies"} Difficulty using hands for taps, buttons, cutlery, and/or writing: {ACTIONS;DENIES/REPORTS:21021675::"Denies"}  No Rheumatology ROS completed.   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/22/2021   CVA (cerebral vascular accident) ([place]) 01/20/2021   Compulsive skin picking 10/24/2020   Brain fog 10/24/2020   History of COVID-19 10/24/2020   Cough 10/24/2020   Hemorrhagic cyst of left ovary 11/11/2019   Degeneration of lumbar intervertebral disc 10/26/2019   Intractable migraine with aura without status migrainosus 05/09/2018   Right sided weakness 03/14/2018   Chronic migraine 03/14/2018   Loss of consciousness ([place]) 03/14/2018   Chronic adhesive idiopathic pericarditis 10/15/2017   Panic attacks 10/05/2017   Paroxysmal SVT (supraventricular tachycardia) ([place]) 10/05/2017   Other forms of dyspnea 10/03/2017   Sinus tachycardia 10/03/2017   Episode of recurrent major depressive disorder ([place]) 09/27/2017   Thrombocytopenia ([place]) 09/13/2017   Normocytic normochromic anemia 09/13/2017   Pericardial effusion 09/12/2017   Atypical chest pain  06/18/2016   Palpitations 06/18/2016   Depression 06/13/2016   Pseudoseizures ([place]) 06/08/2016   Seizure ([place]) 06/08/2016   Somatization disorder 06/08/2016   Anxiety 12/28/2015   TIA (transient ischemic attack) 12/28/2015   Tremor 12/28/2015   GAD (generalized anxiety disorder) 12/28/2015   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   Cerebral infarction due to thrombosis of cerebral artery ([place]) 12/26/2012    Past Medical History:  Diagnosis Date   Anxiety    Dyslipidemia    Fibromyalgia    Headache    Pericarditis    Seizures (HCC)    Tachycardia    TIA (transient ischemic attack)    Recurrent    Family History  Problem Relation Age of Onset   Hypertension Mother    Hypertension Father    Heart attack Father 93   Healthy Sister    Healthy Sister    Healthy Sister    Hypertension Brother    Diabetes type II Brother    COPD Brother    Healthy Brother    Healthy Brother    Breast cancer Maternal Aunt    Throat cancer Maternal Uncle    Hypertension Maternal Grandmother    Diabetes Paternal Grandmother    Healthy Son    Healthy Son    Healthy Son    Healthy Daughter    Colon cancer Neg Hx    Pancreatic cancer Neg Hx    Stomach cancer Neg Hx    Liver disease Neg Hx    Past Surgical History:  Procedure Laterality Date   ABDOMINAL HYSTERECTOMY     CHOLECYSTECTOMY, LAPAROSCOPIC     Social History   Social History Narrative   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  Immunization History  Administered Date(s) Administered   Influenza-Unspecified 05/08/2017   PFIZER(Purple Top)SARS-COV-2 Vaccination 10/31/2019, 11/24/2019     Objective: Vital Signs: There were no vitals taken for this visit.   Physical Exam   Musculoskeletal Exam: ***  CDAI Exam: CDAI Score: -- Patient Global: --; Provider Global: -- Swollen: --; Tender: -- Joint Exam 06/27/2021   No joint exam has been documented for this visit   There is currently no  information documented on the homunculus. Go to the Rheumatology activity and complete the homunculus joint exam.  Investigation: No additional findings.  Imaging: XR Foot 2 Views Left  Result Date: 05/30/2021 First MTP, PIP and DIP narrowing was noted.  Dorsal spurring was noted.  No intertarsal, tibiotalar or subtalar joint space narrowing was noted.  Small posterior calcaneal spur was noted. Impression: These findings are consistent with osteoarthritis of the foot.  XR Foot 2 Views Right  Result Date: 05/30/2021 PIP and DIP narrowing was noted.  No MTP, intertarsal, tibiotalar or subtalar joint space narrowing was noted.  Dorsal spurring was noted.  Small posterior calcaneal spur was noted.  No erosive changes were noted. Impression: These findings are consistent with osteoarthritis of the foot.  XR Hand 2 View Left  Result Date: 05/30/2021 CMC, PIP and DIP narrowing was noted.  No MCP, intercarpal or radiocarpal joint space narrowing was noted.  No erosive changes were noted. Impression: These findings are consistent with osteoarthritis of the hand.  XR Hand 2 View Right  Result Date: 05/30/2021 CMC, PIP and DIP narrowing was noted.  No MCP, intercarpal or radiocarpal joint space narrowing was noted.  No erosive changes were noted. Impression: These findings are consistent with osteoarthritis of the hand.  XR KNEE 3 VIEW LEFT  Result Date: 05/30/2021 No medial or lateral compartment narrowing was noted.  Mild patellofemoral narrowing was noted.  No chondrocalcinosis was noted. Impression: These findings are consistent with mild chondromalacia patella.  XR KNEE 3 VIEW RIGHT  Result Date: 05/30/2021 No medial or lateral compartment narrowing was noted.  Mild patellofemoral narrowing was noted.  No chondrocalcinosis was noted. Impression: These findings are consistent with mild chondromalacia patella.   Recent Labs: Lab Results  Component Value Date   WBC 7.7 03/29/2021   HGB 12.6  03/29/2021   PLT 200 03/29/2021   NA 136 03/29/2021   K 3.9 03/29/2021   CL 102 03/29/2021   CO2 26 03/29/2021   GLUCOSE 104 (H) 03/29/2021   BUN 15 03/29/2021   CREATININE 0.91 03/29/2021   BILITOT 0.5 03/29/2021   ALKPHOS 51 03/29/2021   AST 25 03/29/2021   ALT 17 03/29/2021   PROT 7.3 03/29/2021   ALBUMIN 4.2 03/29/2021   CALCIUM 9.6 03/29/2021   GFRAA >60 12/12/2014   January 21, 2021 lupus anticoagulant negative, beta-2 GP 1 negative, anticardiolipin negative, ANA negative   04/13/21: ESR 2, CRP<1, CCP ab 1   Speciality Comments: No specialty comments available.  Procedures:  No procedures performed Allergies: Codeine, Morphine, Morphine and related, and Chlorhexidine   Assessment / Plan:     Visit Diagnoses: No diagnosis found.  Orders: No orders of the defined types were placed in this encounter.  No orders of the defined types were placed in this encounter.   Face-to-face time spent with patient was *** minutes. Greater than 50% of time was spent in counseling and coordination of care.  Follow-Up Instructions: No follow-ups on file.   Bo Merino, MD  Note - This record has  been created using [employer].  Chart creation errors have been sought, but may not always  have been located. Such creation errors do not reflect on  the standard of medical care.

## 2021-06-16 ENCOUNTER — Other Ambulatory Visit (HOSPITAL_COMMUNITY): Payer: Self-pay

## 2021-06-16 ENCOUNTER — Ambulatory Visit: Payer: 59 | Admitting: Nurse Practitioner

## 2021-06-16 DIAGNOSIS — M542 Cervicalgia: Secondary | ICD-10-CM | POA: Diagnosis not present

## 2021-06-16 DIAGNOSIS — M48062 Spinal stenosis, lumbar region with neurogenic claudication: Secondary | ICD-10-CM | POA: Diagnosis not present

## 2021-06-16 DIAGNOSIS — G894 Chronic pain syndrome: Secondary | ICD-10-CM | POA: Diagnosis not present

## 2021-06-16 DIAGNOSIS — M5431 Sciatica, right side: Secondary | ICD-10-CM | POA: Diagnosis not present

## 2021-06-16 DIAGNOSIS — M545 Low back pain, unspecified: Secondary | ICD-10-CM | POA: Diagnosis not present

## 2021-06-16 DIAGNOSIS — Z79891 Long term (current) use of opiate analgesic: Secondary | ICD-10-CM | POA: Diagnosis not present

## 2021-06-16 DIAGNOSIS — M5136 Other intervertebral disc degeneration, lumbar region: Secondary | ICD-10-CM | POA: Diagnosis not present

## 2021-06-16 DIAGNOSIS — M5432 Sciatica, left side: Secondary | ICD-10-CM | POA: Diagnosis not present

## 2021-06-16 DIAGNOSIS — G8929 Other chronic pain: Secondary | ICD-10-CM | POA: Diagnosis not present

## 2021-06-16 MED ORDER — HYDROCODONE-ACETAMINOPHEN 7.5-325 MG PO TABS
ORAL_TABLET | ORAL | 0 refills | Status: DC
  Filled 2021-06-16: qty 90, 30d supply, fill #0

## 2021-06-21 ENCOUNTER — Ambulatory Visit (INDEPENDENT_AMBULATORY_CARE_PROVIDER_SITE_OTHER): Payer: 59 | Admitting: Clinical

## 2021-06-21 ENCOUNTER — Telehealth (HOSPITAL_COMMUNITY): Payer: Self-pay

## 2021-06-21 ENCOUNTER — Other Ambulatory Visit: Payer: Self-pay

## 2021-06-21 ENCOUNTER — Other Ambulatory Visit (HOSPITAL_COMMUNITY): Payer: Self-pay

## 2021-06-21 DIAGNOSIS — F331 Major depressive disorder, recurrent, moderate: Secondary | ICD-10-CM

## 2021-06-21 DIAGNOSIS — F4310 Post-traumatic stress disorder, unspecified: Secondary | ICD-10-CM

## 2021-06-21 DIAGNOSIS — F419 Anxiety disorder, unspecified: Secondary | ICD-10-CM | POA: Diagnosis not present

## 2021-06-21 MED ORDER — VORTIOXETINE HBR 10 MG PO TABS
10.0000 mg | ORAL_TABLET | Freq: Every day | ORAL | 1 refills | Status: DC
  Filled 2021-06-21: qty 30, 30d supply, fill #0
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
## 2021-06-21 NOTE — Telephone Encounter (Signed)
Patient called and stated that she received samples of Trintellix 10mg  to see how she did on the medication. She stated that it works well for her so could you send in a script or would you like me to send it in for Trintellix 10mg  to be sent to [facility]? Please review and advise. Thank you

## 2021-06-21 NOTE — Telephone Encounter (Signed)
Done

## 2021-06-21 NOTE — Telephone Encounter (Signed)
NOTIFIED PATIENT. PATIENT STATED THAT SHE PICKED IT UP

## 2021-06-26 ENCOUNTER — Other Ambulatory Visit: Payer: Self-pay

## 2021-06-26 ENCOUNTER — Telehealth (HOSPITAL_BASED_OUTPATIENT_CLINIC_OR_DEPARTMENT_OTHER): Payer: 59 | Admitting: Psychiatry

## 2021-06-26 ENCOUNTER — Other Ambulatory Visit (HOSPITAL_COMMUNITY): Payer: Self-pay

## 2021-06-26 ENCOUNTER — Encounter (HOSPITAL_COMMUNITY): Payer: Self-pay | Admitting: Psychiatry

## 2021-06-26 VITALS — Wt 235.0 lb

## 2021-06-26 DIAGNOSIS — F4310 Post-traumatic stress disorder, unspecified: Secondary | ICD-10-CM

## 2021-06-26 DIAGNOSIS — F331 Major depressive disorder, recurrent, moderate: Secondary | ICD-10-CM | POA: Diagnosis not present

## 2021-06-26 DIAGNOSIS — F419 Anxiety disorder, unspecified: Secondary | ICD-10-CM | POA: Diagnosis not present

## 2021-06-26 MED ORDER — OLANZAPINE-FLUOXETINE HCL 3-25 MG PO CAPS
1.0000 | ORAL_CAPSULE | Freq: Every evening | ORAL | 2 refills | Status: DC
  Filled 2021-06-26: qty 30, 30d supply, fill #0
  Filled 2021-07-29: qty 30, 30d supply, fill #1
  Filled 2021-09-03: qty 30, 30d supply, fill #2

## 2021-06-26 MED ORDER — VORTIOXETINE HBR 10 MG PO TABS
10.0000 mg | ORAL_TABLET | Freq: Every day | ORAL | 2 refills | Status: DC
  Filled 2021-06-26 – 2021-07-16 (×2): qty 30, 30d supply, fill #0
  Filled 2021-08-15: qty 30, 30d supply, fill #1
  Filled 2021-09-25: qty 30, 30d supply, fill #2

## 2021-06-26 MED ORDER — ALPRAZOLAM 0.5 MG PO TABS
0.5000 mg | ORAL_TABLET | Freq: Two times a day (BID) | ORAL | 2 refills | Status: DC | PRN
  Filled 2021-06-26 – 2021-07-10 (×2): qty 45, 23d supply, fill #0
  Filled 2021-07-29: qty 45, 23d supply, fill #1
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
## 2021-06-27 ENCOUNTER — Ambulatory Visit: Payer: 59 | Admitting: Rheumatology

## 2021-06-27 ENCOUNTER — Other Ambulatory Visit (HOSPITAL_COMMUNITY): Payer: Self-pay

## 2021-06-27 ENCOUNTER — Telehealth: Payer: Self-pay

## 2021-06-27 DIAGNOSIS — I3139 Other pericardial effusion (noninflammatory): Secondary | ICD-10-CM

## 2021-06-27 DIAGNOSIS — G43119 Migraine with aura, intractable, without status migrainosus: Secondary | ICD-10-CM

## 2021-06-27 DIAGNOSIS — Z820 Family history of epilepsy and other diseases of the nervous system: Secondary | ICD-10-CM

## 2021-06-27 DIAGNOSIS — G451 Carotid artery syndrome (hemispheric): Secondary | ICD-10-CM

## 2021-06-27 DIAGNOSIS — I471 Supraventricular tachycardia: Secondary | ICD-10-CM

## 2021-06-27 DIAGNOSIS — I310 Chronic adhesive pericarditis: Secondary | ICD-10-CM

## 2021-06-27 DIAGNOSIS — Z8261 Family history of arthritis: Secondary | ICD-10-CM

## 2021-06-27 DIAGNOSIS — F410 Panic disorder [episodic paroxysmal anxiety] without agoraphobia: Secondary | ICD-10-CM

## 2021-06-27 DIAGNOSIS — M5134 Other intervertebral disc degeneration, thoracic region: Secondary | ICD-10-CM

## 2021-06-27 DIAGNOSIS — M5030 Other cervical disc degeneration, unspecified cervical region: Secondary | ICD-10-CM

## 2021-06-27 DIAGNOSIS — M5136 Other intervertebral disc degeneration, lumbar region: Secondary | ICD-10-CM

## 2021-06-27 DIAGNOSIS — G459 Transient cerebral ischemic attack, unspecified: Secondary | ICD-10-CM

## 2021-06-27 DIAGNOSIS — M19041 Primary osteoarthritis, right hand: Secondary | ICD-10-CM

## 2021-06-27 DIAGNOSIS — F060 Psychotic disorder with hallucinations due to known physiological condition: Secondary | ICD-10-CM

## 2021-06-27 DIAGNOSIS — M19072 Primary osteoarthritis, left ankle and foot: Secondary | ICD-10-CM

## 2021-06-27 DIAGNOSIS — R569 Unspecified convulsions: Secondary | ICD-10-CM

## 2021-06-27 DIAGNOSIS — M25551 Pain in right hip: Secondary | ICD-10-CM

## 2021-06-27 DIAGNOSIS — M2241 Chondromalacia patellae, right knee: Secondary | ICD-10-CM

## 2021-06-27 DIAGNOSIS — E785 Hyperlipidemia, unspecified: Secondary | ICD-10-CM

## 2021-06-27 NOTE — Telephone Encounter (Signed)
Patient called to cancel her NPT FU appointment scheduled for tomorrow.  Patient states she is unable to find coverage for work.  Patient is asking if this appointment could be virtual on Thursday, 06/22/21.  Patient states if this appointment can not be virtual.  She could possibly schedule Tuesday, 07/04/21 at 3:30.

## 2021-06-27 NOTE — Progress Notes (Deleted)
Office Visit Note  Patient: Desiree Russell             Date of Birth: [birth year]           MRN: [id number]             PCP: Rip Harbour, NP Referring: Rip Harbour, NP Visit Date: 06/28/2021 Occupation: @GUAROCC @  Subjective:  No chief complaint on file.   History of Present Illness: Desiree Russell is a 50 y.o. female ***   Activities of Daily Living:  Patient reports morning stiffness for *** {minute/hour:19697}.   Patient {ACTIONS;DENIES/REPORTS:21021675::"Denies"} nocturnal pain.  Difficulty dressing/grooming: {ACTIONS;DENIES/REPORTS:21021675::"Denies"} Difficulty climbing stairs: {ACTIONS;DENIES/REPORTS:21021675::"Denies"} Difficulty getting out of chair: {ACTIONS;DENIES/REPORTS:21021675::"Denies"} Difficulty using hands for taps, buttons, cutlery, and/or writing: {ACTIONS;DENIES/REPORTS:21021675::"Denies"}  No Rheumatology ROS completed.   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/22/2021   CVA (cerebral vascular accident) ([place]) 01/20/2021   Compulsive skin picking 10/24/2020   Brain fog 10/24/2020   History of COVID-19 10/24/2020   Cough 10/24/2020   Hemorrhagic cyst of left ovary 11/11/2019   Degeneration of lumbar intervertebral disc 10/26/2019   Intractable migraine with aura without status migrainosus 05/09/2018   Right sided weakness 03/14/2018   Chronic migraine 03/14/2018   Loss of consciousness ([place]) 03/14/2018   Chronic adhesive idiopathic pericarditis 10/15/2017   Panic attacks 10/05/2017   Paroxysmal SVT (supraventricular tachycardia) ([place]) 10/05/2017   Other forms of dyspnea 10/03/2017   Sinus tachycardia 10/03/2017   Episode of recurrent major depressive disorder ([place]) 09/27/2017   Thrombocytopenia ([place]) 09/13/2017   Normocytic normochromic anemia 09/13/2017   Pericardial effusion 09/12/2017   Atypical chest pain  06/18/2016   Palpitations 06/18/2016   Depression 06/13/2016   Pseudoseizures ([place]) 06/08/2016   Seizure ([place]) 06/08/2016   Somatization disorder 06/08/2016   Anxiety 12/28/2015   TIA (transient ischemic attack) 12/28/2015   Tremor 12/28/2015   GAD (generalized anxiety disorder) 12/28/2015   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   Cerebral infarction due to thrombosis of cerebral artery ([place]) 12/26/2012    Past Medical History:  Diagnosis Date   Anxiety    Dyslipidemia    Fibromyalgia    Headache    Pericarditis    Seizures (HCC)    Tachycardia    TIA (transient ischemic attack)    Recurrent    Family History  Problem Relation Age of Onset   Hypertension Mother    Hypertension Father    Heart attack Father 70   Healthy Sister    Healthy Sister    Healthy Sister    Hypertension Brother    Diabetes type II Brother    COPD Brother    Healthy Brother    Healthy Brother    Breast cancer Maternal Aunt    Throat cancer Maternal Uncle    Hypertension Maternal Grandmother    Diabetes Paternal Grandmother    Healthy Son    Healthy Son    Healthy Son    Healthy Daughter    Colon cancer Neg Hx    Pancreatic cancer Neg Hx    Stomach cancer Neg Hx    Liver disease Neg Hx    Past Surgical History:  Procedure Laterality Date   ABDOMINAL HYSTERECTOMY     CHOLECYSTECTOMY, LAPAROSCOPIC     Social History   Social History Narrative   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  Immunization History  Administered Date(s) Administered   Influenza-Unspecified 05/08/2017   PFIZER(Purple Top)SARS-COV-2 Vaccination 10/31/2019, 11/24/2019     Objective: Vital Signs: There were no vitals taken for this visit.   Physical Exam   Musculoskeletal Exam: ***  CDAI Exam: CDAI Score: -- Patient Global: --; Provider Global: -- Swollen: --; Tender: -- Joint Exam 06/28/2021   No joint exam has been documented for this visit   There is currently no  information documented on the homunculus. Go to the Rheumatology activity and complete the homunculus joint exam.  Investigation: No additional findings.  Imaging: XR Foot 2 Views Left  Result Date: 05/30/2021 First MTP, PIP and DIP narrowing was noted.  Dorsal spurring was noted.  No intertarsal, tibiotalar or subtalar joint space narrowing was noted.  Small posterior calcaneal spur was noted. Impression: These findings are consistent with osteoarthritis of the foot.  XR Foot 2 Views Right  Result Date: 05/30/2021 PIP and DIP narrowing was noted.  No MTP, intertarsal, tibiotalar or subtalar joint space narrowing was noted.  Dorsal spurring was noted.  Small posterior calcaneal spur was noted.  No erosive changes were noted. Impression: These findings are consistent with osteoarthritis of the foot.  XR Hand 2 View Left  Result Date: 05/30/2021 CMC, PIP and DIP narrowing was noted.  No MCP, intercarpal or radiocarpal joint space narrowing was noted.  No erosive changes were noted. Impression: These findings are consistent with osteoarthritis of the hand.  XR Hand 2 View Right  Result Date: 05/30/2021 CMC, PIP and DIP narrowing was noted.  No MCP, intercarpal or radiocarpal joint space narrowing was noted.  No erosive changes were noted. Impression: These findings are consistent with osteoarthritis of the hand.  XR KNEE 3 VIEW LEFT  Result Date: 05/30/2021 No medial or lateral compartment narrowing was noted.  Mild patellofemoral narrowing was noted.  No chondrocalcinosis was noted. Impression: These findings are consistent with mild chondromalacia patella.  XR KNEE 3 VIEW RIGHT  Result Date: 05/30/2021 No medial or lateral compartment narrowing was noted.  Mild patellofemoral narrowing was noted.  No chondrocalcinosis was noted. Impression: These findings are consistent with mild chondromalacia patella.   Recent Labs: Lab Results  Component Value Date   WBC 7.7 03/29/2021   HGB 12.6  03/29/2021   PLT 200 03/29/2021   NA 136 03/29/2021   K 3.9 03/29/2021   CL 102 03/29/2021   CO2 26 03/29/2021   GLUCOSE 104 (H) 03/29/2021   BUN 15 03/29/2021   CREATININE 0.91 03/29/2021   BILITOT 0.5 03/29/2021   ALKPHOS 51 03/29/2021   AST 25 03/29/2021   ALT 17 03/29/2021   PROT 7.3 03/29/2021   ALBUMIN 4.2 03/29/2021   CALCIUM 9.6 03/29/2021   GFRAA >60 12/12/2014   May 30, 2021 ESR 2, RF negative, ACE 60  04/13/21: ESR 2, CRP<1, CCP ab 1  January 21, 2021 lupus anticoagulant negative, beta-2 GP 1 negative, anticardiolipin negative, ANA negative   Speciality Comments: No specialty comments available.  Procedures:  No procedures performed Allergies: Codeine, Morphine, Morphine and related, and Chlorhexidine   Assessment / Plan:     Visit Diagnoses: No diagnosis found.  Orders: No orders of the defined types were placed in this encounter.  No orders of the defined types were placed in this encounter.   Face-to-face time spent with patient was *** minutes. Greater than 50% of time was spent in counseling and coordination of care.  Follow-Up Instructions: No follow-ups on file.  Bo Merino, MD  Note - This record has been created using Editor, commissioning.  Chart creation errors have been sought, but may not always  have been located. Such creation errors do not reflect on  the standard of medical care.

## 2021-06-27 NOTE — Telephone Encounter (Signed)
Patient has canceled her appointment twice.  We can give her next available (nonurgent) appointment for virtual visit or an office visit whenever she finds coverage.

## 2021-06-28 ENCOUNTER — Ambulatory Visit: Payer: 59 | Admitting: Rheumatology

## 2021-06-28 NOTE — Telephone Encounter (Signed)
LMOM for patient to call and schedule virtual or office visit

## 2021-06-29 ENCOUNTER — Other Ambulatory Visit: Payer: Self-pay

## 2021-06-29 ENCOUNTER — Encounter: Payer: Self-pay | Admitting: Rheumatology

## 2021-06-29 ENCOUNTER — Ambulatory Visit (INDEPENDENT_AMBULATORY_CARE_PROVIDER_SITE_OTHER): Payer: 59 | Admitting: Rheumatology

## 2021-06-29 VITALS — Ht 64.0 in

## 2021-06-29 DIAGNOSIS — M19041 Primary osteoarthritis, right hand: Secondary | ICD-10-CM

## 2021-06-29 DIAGNOSIS — M5136 Other intervertebral disc degeneration, lumbar region: Secondary | ICD-10-CM

## 2021-06-29 DIAGNOSIS — Z8616 Personal history of COVID-19: Secondary | ICD-10-CM

## 2021-06-29 DIAGNOSIS — M19042 Primary osteoarthritis, left hand: Secondary | ICD-10-CM

## 2021-06-29 DIAGNOSIS — E785 Hyperlipidemia, unspecified: Secondary | ICD-10-CM

## 2021-06-29 DIAGNOSIS — G451 Carotid artery syndrome (hemispheric): Secondary | ICD-10-CM

## 2021-06-29 DIAGNOSIS — M19071 Primary osteoarthritis, right ankle and foot: Secondary | ICD-10-CM

## 2021-06-29 DIAGNOSIS — R569 Unspecified convulsions: Secondary | ICD-10-CM

## 2021-06-29 DIAGNOSIS — M797 Fibromyalgia: Secondary | ICD-10-CM

## 2021-06-29 DIAGNOSIS — D696 Thrombocytopenia, unspecified: Secondary | ICD-10-CM

## 2021-06-29 DIAGNOSIS — M5134 Other intervertebral disc degeneration, thoracic region: Secondary | ICD-10-CM | POA: Diagnosis not present

## 2021-06-29 DIAGNOSIS — D649 Anemia, unspecified: Secondary | ICD-10-CM

## 2021-06-29 DIAGNOSIS — M170 Bilateral primary osteoarthritis of knee: Secondary | ICD-10-CM

## 2021-06-29 DIAGNOSIS — I471 Supraventricular tachycardia: Secondary | ICD-10-CM

## 2021-06-29 DIAGNOSIS — I310 Chronic adhesive pericarditis: Secondary | ICD-10-CM

## 2021-06-29 DIAGNOSIS — M19072 Primary osteoarthritis, left ankle and foot: Secondary | ICD-10-CM

## 2021-06-29 DIAGNOSIS — F410 Panic disorder [episodic paroxysmal anxiety] without agoraphobia: Secondary | ICD-10-CM

## 2021-06-29 DIAGNOSIS — I3139 Other pericardial effusion (noninflammatory): Secondary | ICD-10-CM

## 2021-06-29 DIAGNOSIS — Z8261 Family history of arthritis: Secondary | ICD-10-CM

## 2021-06-29 DIAGNOSIS — M7061 Trochanteric bursitis, right hip: Secondary | ICD-10-CM

## 2021-06-29 DIAGNOSIS — I6330 Cerebral infarction due to thrombosis of unspecified cerebral artery: Secondary | ICD-10-CM

## 2021-06-29 DIAGNOSIS — F450 Somatization disorder: Secondary | ICD-10-CM

## 2021-06-29 DIAGNOSIS — G459 Transient cerebral ischemic attack, unspecified: Secondary | ICD-10-CM

## 2021-06-29 DIAGNOSIS — G43119 Migraine with aura, intractable, without status migrainosus: Secondary | ICD-10-CM

## 2021-06-29 DIAGNOSIS — M5030 Other cervical disc degeneration, unspecified cervical region: Secondary | ICD-10-CM | POA: Diagnosis not present

## 2021-06-29 DIAGNOSIS — Z820 Family history of epilepsy and other diseases of the nervous system: Secondary | ICD-10-CM

## 2021-06-29 DIAGNOSIS — F060 Psychotic disorder with hallucinations due to known physiological condition: Secondary | ICD-10-CM

## 2021-06-29 NOTE — Progress Notes (Signed)
Virtual Visit via Telephone Note  I connected with Desiree Russell on 06/29/21 at  4:00 PM EST by telephone and verified that I am speaking with the correct person using two identifiers. Location: Patient: Home  Provider: Clinic  This service was conducted via virtual visit.   The patient was located at home. I was located in my office.  Consent was obtained prior to the virtual visit and is aware of possible charges through their insurance for this visit.  The patient is an established patient.  Dr. Estanislado Pandy, MD conducted the virtual visit and Hazel Sams, PA-C acted as scribe during the service.  Office staff helped with scheduling follow up visits after the service was conducted.    I discussed the limitations, risks, security and privacy concerns of performing an evaluation and management service by telephone and the availability of in person appointments. I also discussed with the patient that there may be a patient responsible charge related to this service. The patient expressed understanding and agreed to proceed.  CC: Discuss lab work and X-ray results  History of Present Illness: Patient is a 50 year old female with a past medical history of polyarthralgias, fibromyalgia, and DDD.  The patient scheduled the virtual visit today to discuss lab work and x-ray results from her initial office visit on 05/30/2021 with Dr. Estanislado Pandy.  Since then she continues to have chronic pain involving multiple joints including both hands, both knees, both feet.  She has generalized myalgias and muscle tenderness due to fibromyalgia.  She states that her husband recently purchased her a stationary bike and she also has resistive bands at home which she plans on starting to use for strengthening exercises.  She continues to have chronic pain in her neck and lower back and has had MRIs in the past for further evaluation.   Patient reports morning stiffness for 3 hours.   Patient reports nocturnal pain.  Difficulty  dressing/grooming: Reports Difficulty climbing stairs: Reports Difficulty getting out of chair: Reports Difficulty using hands for taps, buttons, cutlery, and/or writing: Reports  Review of Systems  Constitutional:  Positive for malaise/fatigue. Negative for fever.  Eyes:  Negative for photophobia, pain, discharge and redness.  Respiratory:  Negative for cough, shortness of breath and wheezing.   Cardiovascular:  Positive for leg swelling. Negative for chest pain and palpitations.  Gastrointestinal:  Negative for blood in stool, constipation and diarrhea.  Genitourinary:  Negative for dysuria.  Musculoskeletal:  Negative for back pain, joint pain, myalgias and neck pain.  Skin:  Negative for rash.  Neurological:  Negative for dizziness and headaches.  Psychiatric/Behavioral:  Negative for depression. The patient is not nervous/anxious and does not have insomnia.       Observations/Objective: Physical Exam Neurological:     Mental Status: She is alert and oriented to person, place, and time.  Psychiatric:        Mood and Affect: Mood and affect normal.        Cognition and Memory: Memory normal.        Judgment: Judgment normal.     Assessment and Plan: Diagnoses and all orders for this visit:  Fibromyalgia: She has persistent myalgias and muscle tenderness due to fibromyalgia.  Discussed the importance of regular exercise and good sleep hygiene.  Primary osteoarthritis of both hands: X-rays of both hands were consistent with osteoarthritis of both hands on 05/30/2021.  Lab work from 04/13/2021 was reviewed today in the office: ESR within normal limits, CRP within normal limits,  anti-CCP negative.  Lab work from 05/30/2021 was reviewed today in the office: ESR within normal limits, RF negative, and ACE WNL.  Discussed lab results in detail with the patient today and all questions were addressed.  She does not have any clinical features of rheumatoid arthritis at this time.  Her  symptoms are consistent with osteoarthritis.  Discussed the importance of joint protection and muscle strengthening.  She was advised to notify us if she develops increased joint pain or inflammation in the future.  Primary osteoarthritis of both knees: She has bilateral mild chondromalacia patella noted on x-rays from 05/30/2021.  ESR within normal limits at that time.  She continues to use to have persistent pain and stiffness in both knee joints.  She has not noticed any joint swelling.  Discussed the importance of lower extremity muscle strengthening.  She recently purchased a stationary bike and has resistive bands, which she plans on using for strengthening exercises.  She declined physical therapy at this time.   Primary osteoarthritis of both feet: X-rays of both feet were obtained at her initial office visit on 05/30/2021 which were consistent with osteoarthritic changes.  Discussed the importance of wearing proper fitting shoes.  Trochanteric bursitis of right hip: Discussed the diagnosis of trochanteric bursitis.  Offered a referral to physical therapy but she declined at this time.  She was strongly encouraged perform daily stretching exercises for symptomatic relief.  DDD (degenerative disc disease), cervical: She continues to have chronic neck pain and stiffness.  She had an MRI on 11/12/2019 which revealed multilevel spondylosis and facet joint arthropathy.  DDD (degenerative disc disease), thoracic: Chronic pain.  MRI on 11/12/2019 was consistent with mild degenerative change.  DDD (degenerative disc disease), lumbar: Chronic pain.  She had an MRI on 03/10/2021 which revealed mild degenerative disc disease and facet joint arthropathy.  No spinal stenosis was noted at that time.  Discussed the importance of core strengthening and lower back exercises.  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 The patient was provided an opportunity to ask questions and all were answered. The patient agreed with the plan and demonstrated an understanding of the instructions.   The patient was advised to call back or seek an in-person evaluation if the symptoms worsen or if the condition fails to improve as anticipated.  I provided 30 minutes of non-face-to-face time during this encounter.  Scribed byHazel Sams, PA-C  I reviewed the above note  and I attest to the accuracy of the document.  Bo Merino, MD

## 2021-07-05 ENCOUNTER — Other Ambulatory Visit: Payer: Self-pay

## 2021-07-05 ENCOUNTER — Ambulatory Visit (INDEPENDENT_AMBULATORY_CARE_PROVIDER_SITE_OTHER): Payer: 59 | Admitting: Clinical

## 2021-07-05 DIAGNOSIS — F4310 Post-traumatic stress disorder, unspecified: Secondary | ICD-10-CM | POA: Diagnosis not present

## 2021-07-05 DIAGNOSIS — F331 Major depressive disorder, recurrent, moderate: Secondary | ICD-10-CM | POA: Diagnosis not present

## 2021-07-05 DIAGNOSIS — F419 Anxiety disorder, unspecified: Secondary | ICD-10-CM
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
## 2021-07-10 ENCOUNTER — Telehealth (HOSPITAL_COMMUNITY): Payer: 59 | Admitting: Psychiatry

## 2021-07-11 ENCOUNTER — Other Ambulatory Visit (HOSPITAL_COMMUNITY): Payer: Self-pay

## 2021-07-13 ENCOUNTER — Ambulatory Visit: Payer: Self-pay | Admitting: Nurse Practitioner

## 2021-07-13 NOTE — Progress Notes (Signed)
Subjective:  Patient ID: Desiree Russell, female    DOB: 11-07-1970  Age: 50 y.o. MRN: [id number]  Chief Complaint  Patient presents with   Weight management    HPI   Patient presents today weight loss management to improve her overall health. She tells me she has been exercising regularly and feels well. She has been encouraged by rheumatologist to lose weight to improve joint pain. She has multiple co-morbidities which include small vessel disease and previous CVA. Current BMI 40, she has a previous family history of type 2 diabetes mellitus, she has a history of impaired fasting glucose.    Current Outpatient Medications on File Prior to Visit  Medication Sig Dispense Refill   acetaminophen (TYLENOL) 325 MG tablet Take 650 mg by mouth every 6 (six) hours as needed for mild pain, fever or headache.     albuterol (VENTOLIN HFA) 108 (90 Base) MCG/ACT inhaler INHALE 2 PUFFS INTO THE LUNGS EVERY 4 HOURS AS NEEDED FOR WHEEZING. (Patient taking differently: Inhale 2 puffs into the lungs every 4 (four) hours as needed for wheezing.) 8.5 g 0   ALPRAZolam (XANAX) 0.5 MG tablet Take 1 tablet by mouth 2 times daily as needed for anxiety. 45 tablet 2   aspirin 81 MG EC tablet Take 1 tablet (81 mg total) by mouth daily. 21 tablet 0   atorvastatin (LIPITOR) 40 MG tablet Take 1 tablet (40 mg total) by mouth daily. (Patient not taking: Reported on 06/29/2021) 90 tablet 3   azelastine (ASTELIN) 0.1 % nasal spray Place 1 spray into both nostrils 2 (two) times daily. Use in each nostril as directed 30 mL 12   Calcium-Vitamin D-Vitamin K 650-12.5-40 MG-MCG-MCG CHEW Chew 1 tablet by mouth daily.     cephALEXin (KEFLEX) 250 MG capsule Take 1 capsule by mouth once daily after relations as directed. 30 capsule 3   clopidogrel (PLAVIX) 75 MG tablet Take 1 tablet by mouth daily. 90 tablet 1   cyclobenzaprine (FLEXERIL) 10 MG tablet Take 5-10 mg by mouth at bedtime as needed for muscle spasms.     furosemide (LASIX)  20 MG tablet Take 1 tablet by mouth daily as needed. 90 tablet 3   gabapentin (NEURONTIN) 600 MG tablet Take 1 tablet  by mouth at bedtime. 90 tablet 1   Galcanezumab-gnlm (EMGALITY) 120 MG/ML SOSY Inject 120 mg under the skin every 28 days 1 mL 6   HYDROcodone-acetaminophen (NORCO) 7.5-325 MG tablet Take 1 tablet by mouth every 8 hours as needed for pain 90 tablet 0   HYDROcodone-acetaminophen (NORCO/VICODIN) 5-325 MG tablet Take 1 tablet by mouth 2 (two) times daily as needed (Patient not taking: Reported on 06/29/2021) 14 tablet 0   HYDROcodone-acetaminophen (NORCO/VICODIN) 5-325 MG tablet Take 1 tablet by mouth once daily as needed (Patient not taking: Reported on 06/29/2021) 15 tablet 0   loratadine (CLARITIN) 10 MG tablet Take 10 mg by mouth daily.     metoprolol succinate (TOPROL-XL) 25 MG 24 hr tablet Take 1 tablet by mouth daily. 90 tablet 3   Multiple Vitamin (MULTIVITAMIN WITH MINERALS) TABS tablet Take 1 tablet by mouth daily.     Na Sulfate-K Sulfate-Mg Sulf (SUPREP BOWEL PREP KIT) 17.5-3.13-1.6 GM/177ML SOLN Take 1 kit by mouth as directed. For colonoscopy prep (Patient not taking: Reported on 05/30/2021) 354 mL 0   OLANZapine-FLUoxetine (SYMBYAX) 3-25 MG capsule Take 1 capsule by mouth every evening. 30 capsule 2   oxybutynin (DITROPAN-XL) 5 MG 24 hr tablet Take 1 (one) tablet  by mouth at night for overactive bladder/urinary frequency.  Replaces tolterodine. 30 tablet 2   oxybutynin (DITROPAN-XL) 5 MG 24 hr tablet Take 1 tablet by mouth at night for overactive bladder/urinary frequency (Patient not taking: Reported on 05/30/2021) 90 tablet 1   pantoprazole (PROTONIX) 40 MG tablet Take 1 tablet by mouth daily. 30 tablet 11   senna-docusate (SENOKOT-S) 8.6-50 MG tablet Take 2 tablets by mouth daily.     Ubrogepant (UBRELVY) 100 MG TABS TAKE  1/2 TABLET AT HEADACHE ONSET. MAY REPEAT DOSE IN TWO HOURS IF NO IMPROVEMENT. DO NOT EXCEED MORE THAN TWO TABLETS IN 24 HOURS. (Patient taking  differently: Take 50-100 mg by mouth See admin instructions. Take 15m at headache onset, may repeat dose in two hours if no improvement. Do not excess more than 2051min 24 hours) 18 tablet 1   valACYclovir (VALTREX) 500 MG tablet Take 1 tablet (500 mg total) by mouth daily. (Patient not taking: Reported on 06/29/2021) 90 tablet 1   vortioxetine HBr (TRINTELLIX) 10 MG TABS tablet Take 1 tablet (10 mg total) by mouth daily. 30 tablet 2   No current facility-administered medications on file prior to visit.   Past Medical History:  Diagnosis Date   Anxiety    Dyslipidemia    Fibromyalgia    Headache    Pericarditis    Seizures (HCC)    Tachycardia    TIA (transient ischemic attack)    Recurrent   Past Surgical History:  Procedure Laterality Date   ABDOMINAL HYSTERECTOMY     CHOLECYSTECTOMY     CHOLECYSTECTOMY, LAPAROSCOPIC      Family History  Problem Relation Age of Onset   Hypertension Mother    Hypertension Father    Heart attack Father [id number] Healthy Sister    Healthy Sister    Healthy Sister    Hypertension Brother    Diabetes type II Brother    COPD Brother    Healthy Brother    Healthy Brother    Breast cancer Maternal Aunt    Throat cancer Maternal Uncle    Hypertension Maternal Grandmother    Diabetes Paternal Grandmother    Healthy Son    Healthy Son    Healthy Son    Healthy Daughter    Colon cancer Neg Hx    Pancreatic cancer Neg Hx    Stomach cancer Neg Hx    Liver disease Neg Hx    Social History   Socioeconomic History   Marital status: Married    Spouse name: Not on file   Number of children: 4   Years of education: Not on file   Highest education level: Not on file  Occupational History   Not on file  Tobacco Use   Smoking status: Never   Smokeless tobacco: Never  Vaping Use   Vaping Use: Never used  Substance and Sexual Activity   Alcohol use: Yes    Comment: occ    Drug use: No   Sexual activity: Not on file  Other Topics Concern    Not on file  Social History Narrative   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     Social Determinants of Health   Financial Resource Strain: Not on file  Food Insecurity: Not on file  Transportation Needs: Not on file  Physical Activity: Not on file  Stress: Not on file  Social Connections: Not on file    Review of Systems  Constitutional:  Negative for chills, fatigue and fever.  HENT:  Negative for congestion, ear pain, rhinorrhea and sore throat.   Respiratory:  Negative for cough and shortness of breath.   Cardiovascular:  Negative for chest pain.  Gastrointestinal:  Negative for abdominal pain, constipation, diarrhea, nausea and vomiting.  Genitourinary:  Negative for dysuria and urgency.  Musculoskeletal:  Negative for back pain and myalgias.  Neurological:  Negative for dizziness, weakness, light-headedness and headaches.  Psychiatric/Behavioral:  Negative for dysphoric mood. The patient is not nervous/anxious.     Objective:  BP 134/78    Pulse (!) 102    Temp (!) 96.3 F (35.7 C)    Ht 5' 4"  (1.626 m)    Wt 233 lb 9.6 oz (106 kg)    SpO2 99%    BMI 40.10 kg/m    BP/Weight 06/29/2021 05/30/2021 24/10/6284  Systolic BP - 381 771  Diastolic BP - 89 76  Wt. (Lbs) - 232.4 225  BMI 40.34 38.67 38.62  Some encounter information is confidential and restricted. Go to Review Flowsheets activity to see all data.    Physical Exam Vitals reviewed.  Constitutional:      Appearance: She is obese.  HENT:     Head: Normocephalic.     Right Ear: Tympanic membrane normal.     Left Ear: Tympanic membrane normal.     Nose: Nose normal.     Mouth/Throat:     Mouth: Mucous membranes are moist.  Eyes:     Pupils: Pupils are equal, round, and reactive to light.  Cardiovascular:     Rate and Rhythm: Normal rate and regular rhythm.     Pulses: Normal pulses.     Heart sounds: Normal heart sounds.  Pulmonary:     Effort: Pulmonary effort is normal.     Breath  sounds: Normal breath sounds.  Abdominal:     General: Bowel sounds are normal.     Palpations: Abdomen is soft.  Skin:    General: Skin is warm and dry.     Capillary Refill: Capillary refill takes less than 2 seconds.  Neurological:     General: No focal deficit present.     Mental Status: She is alert and oriented to person, place, and time.  Psychiatric:        Mood and Affect: Mood normal.        Behavior: Behavior normal.        Lab Results  Component Value Date   WBC 7.7 03/29/2021   HGB 12.6 03/29/2021   HCT 39.1 03/29/2021   PLT 200 03/29/2021   GLUCOSE 104 (H) 03/29/2021   CHOL 175 01/21/2021   TRIG 127 01/21/2021   HDL 40 (L) 01/21/2021   LDLCALC 110 (H) 01/21/2021   ALT 17 03/29/2021   AST 25 03/29/2021   NA 136 03/29/2021   K 3.9 03/29/2021   CL 102 03/29/2021   CREATININE 0.91 03/29/2021   BUN 15 03/29/2021   CO2 26 03/29/2021   TSH 0.520 01/21/2021   INR 1.1 03/10/2021   HGBA1C 4.8 01/21/2021      Assessment & Plan:   1. Mixed hyperlipidemia - CBC with Differential/Platelet - Comprehensive metabolic panel - Hemoglobin A1c - Lipid panel - TSH - Semaglutide,0.25 or 0.5MG/DOS, (OZEMPIC, 0.25 OR 0.5 MG/DOSE,) 2 MG/1.5ML SOPN; Inject 0.5 mg into the skin once a week.  Dispense: 1.5 mL; Refill: 0 - Semaglutide,0.25 or 0.5MG/DOS, (OZEMPIC, 0.25 OR 0.5 MG/DOSE,) 2 MG/1.5ML SOPN; Inject 0.5 mg into  the skin once a week for four weeks as directed  Dispense: 3 mL; Refill: 0  2. Impaired fasting glucose - Semaglutide,0.25 or 0.5MG/DOS, (OZEMPIC, 0.25 OR 0.5 MG/DOSE,) 2 MG/1.5ML SOPN; Inject 0.5 mg into the skin once a week.  Dispense: 1.5 mL; Refill: 0  3. Encounter for weight management  4. Family history of type 2 diabetes mellitus in brother - Semaglutide,0.25 or 0.5MG/DOS, (OZEMPIC, 0.25 OR 0.5 MG/DOSE,) 2 MG/1.5ML SOPN; Inject 0.5 mg into the skin once a week.  Dispense: 1.5 mL; Refill: 0 - Semaglutide,0.25 or 0.5MG/DOS, (OZEMPIC, 0.25 OR 0.5  MG/DOSE,) 2 MG/1.5ML SOPN; Inject 0.5 mg into the skin once a week for four weeks as directed  Dispense: 3 mL; Refill: 0  5. Family history of diabetes mellitus in paternal grandmother - Semaglutide,0.25 or 0.5MG/DOS, (OZEMPIC, 0.25 OR 0.5 MG/DOSE,) 2 MG/1.5ML SOPN; Inject 0.5 mg into the skin once a week.  Dispense: 1.5 mL; Refill: 0 - Semaglutide,0.25 or 0.5MG/DOS, (OZEMPIC, 0.25 OR 0.5 MG/DOSE,) 2 MG/1.5ML SOPN; Inject 0.5 mg into the skin once a week for four weeks as directed  Dispense: 3 mL; Refill: 0  6. BMI 40.0-44.9, adult (HCC) - CBC with Differential/Platelet - Comprehensive metabolic panel - Hemoglobin A1c - Lipid panel - TSH - Semaglutide,0.25 or 0.5MG/DOS, (OZEMPIC, 0.25 OR 0.5 MG/DOSE,) 2 MG/1.5ML SOPN; Inject 0.5 mg into the skin once a week.  Dispense: 1.5 mL; Refill: 0 - Semaglutide,0.25 or 0.5MG/DOS, (OZEMPIC, 0.25 OR 0.5 MG/DOSE,) 2 MG/1.5ML SOPN; Inject 0.5 mg into the skin once a week for four weeks as directed  Dispense: 3 mL; Refill: 0  7. Class 3 drug-induced obesity with serious comorbidity and body mass index (BMI) of 40.0 to 44.9 in adult (HCC) - Semaglutide,0.25 or 0.5MG/DOS, (OZEMPIC, 0.25 OR 0.5 MG/DOSE,) 2 MG/1.5ML SOPN; Inject 0.5 mg into the skin once a week.  Dispense: 1.5 mL; Refill: 0 - Semaglutide,0.25 or 0.5MG/DOS, (OZEMPIC, 0.25 OR 0.5 MG/DOSE,) 2 MG/1.5ML SOPN; Inject 0.5 mg into the skin once a week for four weeks as directed  Dispense: 3 mL; Refill: 0  8. Small vessel disease ([place]) - CBC with Differential/Platelet - Comprehensive metabolic panel - Hemoglobin A1c - Lipid panel - Semaglutide,0.25 or 0.5MG/DOS, (OZEMPIC, 0.25 OR 0.5 MG/DOSE,) 2 MG/1.5ML SOPN; Inject 0.5 mg into the skin once a week.  Dispense: 1.5 mL; Refill: 0 - Semaglutide,0.25 or 0.5MG/DOS, (OZEMPIC, 0.25 OR 0.5 MG/DOSE,) 2 MG/1.5ML SOPN; Inject 0.5 mg into the skin once a week for four weeks as directed  Dispense: 3 mL; Refill: 0      Begin Ozempic 0.25 mg injection into skin  once weekly for four weeks Then increase Ozempic 0.5 mg injection into skin once weekly for four weeks Keep medication in refrigerator Notify office of any side effects Continue medications We will call you with lab results Follow-up in 63-month, fasting      Follow-up: 374-month An After Visit Summary was printed and given to the patient.  I, ShRip HarbourNP, have reviewed all documentation for this visit. The documentation on 07/16/21 for the exam, diagnosis, procedures, and orders are all accurate and complete.    Signed, ShRip HarbourNP [phone]

## 2021-07-14 ENCOUNTER — Ambulatory Visit (INDEPENDENT_AMBULATORY_CARE_PROVIDER_SITE_OTHER): Payer: 59 | Admitting: Nurse Practitioner

## 2021-07-14 ENCOUNTER — Other Ambulatory Visit (HOSPITAL_COMMUNITY): Payer: Self-pay

## 2021-07-14 ENCOUNTER — Other Ambulatory Visit: Payer: Self-pay

## 2021-07-14 VITALS — BP 134/78 | HR 102 | Temp 96.3°F | Ht 64.0 in | Wt 233.6 lb

## 2021-07-14 DIAGNOSIS — E782 Mixed hyperlipidemia: Secondary | ICD-10-CM | POA: Diagnosis not present

## 2021-07-14 DIAGNOSIS — Z79891 Long term (current) use of opiate analgesic: Secondary | ICD-10-CM | POA: Diagnosis not present

## 2021-07-14 DIAGNOSIS — M5136 Other intervertebral disc degeneration, lumbar region: Secondary | ICD-10-CM | POA: Diagnosis not present

## 2021-07-14 DIAGNOSIS — R7301 Impaired fasting glucose: Secondary | ICD-10-CM

## 2021-07-14 DIAGNOSIS — M545 Low back pain, unspecified: Secondary | ICD-10-CM | POA: Diagnosis not present

## 2021-07-14 DIAGNOSIS — Z6841 Body Mass Index (BMI) 40.0 and over, adult: Secondary | ICD-10-CM | POA: Diagnosis not present

## 2021-07-14 DIAGNOSIS — Z7689 Persons encountering health services in other specified circumstances: Secondary | ICD-10-CM | POA: Diagnosis not present

## 2021-07-14 DIAGNOSIS — E661 Drug-induced obesity: Secondary | ICD-10-CM

## 2021-07-14 DIAGNOSIS — Z833 Family history of diabetes mellitus: Secondary | ICD-10-CM

## 2021-07-14 DIAGNOSIS — G8929 Other chronic pain: Secondary | ICD-10-CM | POA: Diagnosis not present

## 2021-07-14 DIAGNOSIS — I739 Peripheral vascular disease, unspecified: Secondary | ICD-10-CM

## 2021-07-14 DIAGNOSIS — E66813 Obesity, class 3: Secondary | ICD-10-CM

## 2021-07-14 DIAGNOSIS — M5432 Sciatica, left side: Secondary | ICD-10-CM | POA: Diagnosis not present

## 2021-07-14 DIAGNOSIS — M542 Cervicalgia: Secondary | ICD-10-CM | POA: Diagnosis not present

## 2021-07-14 DIAGNOSIS — M5431 Sciatica, right side: Secondary | ICD-10-CM | POA: Diagnosis not present

## 2021-07-14 DIAGNOSIS — G894 Chronic pain syndrome: Secondary | ICD-10-CM | POA: Diagnosis not present

## 2021-07-14 DIAGNOSIS — M48062 Spinal stenosis, lumbar region with neurogenic claudication: Secondary | ICD-10-CM | POA: Diagnosis not present

## 2021-07-14 MED ORDER — OZEMPIC (0.25 OR 0.5 MG/DOSE) 2 MG/1.5ML ~~LOC~~ SOPN: 0.5000 mg | PEN_INJECTOR | SUBCUTANEOUS | 0 refills | Status: DC

## 2021-07-14 MED ORDER — OZEMPIC (0.25 OR 0.5 MG/DOSE) 2 MG/1.5ML ~~LOC~~ SOPN
0.5000 mg | PEN_INJECTOR | SUBCUTANEOUS | 0 refills | Status: DC
  Filled 2021-07-14: qty 1.5, 28d supply, fill #0

## 2021-07-14 NOTE — Patient Instructions (Addendum)
Begin Ozempic 0.25 mg injection into skin once weekly for four weeks Then increase Ozempic 0.5 mg injection into skin once weekly for four weeks Keep medication in refrigerator Notify office of any side effects Continue medications We will call you with lab results Follow-up in 38-months, fasting    Semaglutide Injection (Weight Management) What is this medication? SEMAGLUTIDE (SEM a GLOO tide) promotes weight loss. It may also be used to maintain weight loss. It works by decreasing appetite. Changes to diet and exercise are often combined with this medication. This medicine may be used for other purposes; ask your health care provider or pharmacist if you have questions. COMMON BRAND NAME(S): [id number] What should I tell my care team before I take this medication? They need to know if you have any of these conditions: Endocrine tumors (MEN 2) or if someone in your family had these tumors Eye disease, vision problems Gallbladder disease History of depression or mental health disease History of pancreatitis Kidney disease Stomach or intestine problems Suicidal thoughts, plans, or attempt; a previous suicide attempt by you or a family member Thyroid cancer or if someone in your family had thyroid cancer An unusual or allergic reaction to semaglutide, other medications, foods, dyes, or preservatives Pregnant or trying to get pregnant Breast-feeding How should I use this medication? This medication is injected under the skin. You will be taught how to prepare and give it. Take it as directed on the prescription label. It is given once every week (every 7 days). Keep taking it unless your care team tells you to stop. It is important that you put your used needles and pens in a special sharps container. Do not put them in a trash can. If you do not have a sharps container, call your pharmacist or care team to get one. A special MedGuide will be given to you by the pharmacist with each  prescription and refill. Be sure to read this information carefully each time. This medication comes with INSTRUCTIONS FOR USE. Ask your pharmacist for directions on how to use this medication. Read the information carefully. Talk to your pharmacist or care team if you have questions. Talk to your care team about the use of this medication in children. Special care may be needed. Overdosage: If you think you have taken too much of this medicine contact a poison control center or emergency room at once. NOTE: This medicine is only for you. Do not share this medicine with others. What if I miss a dose? If you miss a dose and the next scheduled dose is more than 2 days away, take the missed dose as soon as possible. If you miss a dose and the next scheduled dose is less than 2 days away, do not take the missed dose. Take the next dose at your regular time. Do not take double or extra doses. If you miss your dose for 2 weeks or more, take the next dose at your regular time or call your care team to talk about how to restart this medication. What may interact with this medication? Insulin and other medications for diabetes This list may not describe all possible interactions. Give your health care provider a list of all the medicines, herbs, non-prescription drugs, or dietary supplements you use. Also tell them if you smoke, drink alcohol, or use illegal drugs. Some items may interact with your medicine. What should I watch for while using this medication? Visit your care team for regular checks on your progress. It  may be some time before you see the benefit from this medication. Drink plenty of fluids while taking this medication. Check with your care team if you have severe diarrhea, nausea, and vomiting, or if you sweat a lot. The loss of too much body fluid may make it dangerous for you to take this medication. This medication may affect blood sugar levels. Ask your care team if changes in diet or  medications are needed if you have diabetes. If you or your family notice any changes in your behavior, such as new or worsening depression, thoughts of harming yourself, anxiety, other unusual or disturbing thoughts, or memory loss, call your care team right away. Women should inform their care team if they wish to become pregnant or think they might be pregnant. Losing weight while pregnant is not advised and may cause harm to the unborn child. Talk to your care team for more information. What side effects may I notice from receiving this medication? Side effects that you should report to your care team as soon as possible: Allergic reactions--skin rash, itching, hives, swelling of the face, lips, tongue, or throat Change in vision Dehydration--increased thirst, dry mouth, feeling faint or lightheaded, headache, dark yellow or brown urine Gallbladder problems--severe stomach pain, nausea, vomiting, fever Heart palpitations--rapid, pounding, or irregular heartbeat Kidney injury--decrease in the amount of urine, swelling of the ankles, hands, or feet Pancreatitis--severe stomach pain that spreads to your back or gets worse after eating or when touched, fever, nausea, vomiting Thoughts of suicide or self-harm, worsening mood, feelings of depression Thyroid cancer--new mass or lump in the neck, pain or trouble swallowing, trouble breathing, hoarseness Side effects that usually do not require medical attention (report to your care team if they continue or are bothersome): Diarrhea Loss of appetite Nausea Stomach pain Vomiting This list may not describe all possible side effects. Call your doctor for medical advice about side effects. You may report side effects to FDA at 1-800-FDA-1088. Where should I keep my medication? Keep out of the reach of children and pets. Refrigeration (preferred): Store in the refrigerator. Do not freeze. Keep this medication in the original container until you are  ready to take it. Get rid of any unused medication after the expiration date. Room temperature: If needed, prior to cap removal, the pen can be stored at room temperature for up to 28 days. Protect from light. If it is stored at room temperature, get rid of any unused medication after 28 days or after it expires, whichever is first. It is important to get rid of the medication as soon as you no longer need it or it is expired. You can do this in two ways: Take the medication to a medication take-back program. Check with your pharmacy or law enforcement to find a location. If you cannot return the medication, follow the directions in the [facility]. NOTE: This sheet is a summary. It may not cover all possible information. If you have questions about this medicine, talk to your doctor, pharmacist, or health care provider.  2022 Elsevier/Gold Standard (2020-10-21 00:00:00)

## 2021-07-15 LAB — COMPREHENSIVE METABOLIC PANEL
ALT: 16 IU/L (ref 0–32)
AST: 36 IU/L (ref 0–40)
Albumin/Globulin Ratio: 1.6 (ref 1.2–2.2)
Albumin: 4.1 g/dL (ref 3.8–4.8)
Alkaline Phosphatase: 46 IU/L (ref 44–121)
BUN/Creatinine Ratio: 12 (ref 9–23)
BUN: 13 mg/dL (ref 6–24)
Bilirubin Total: <0.2 mg/dL (ref 0.0–1.2)
CO2: 23 mmol/L (ref 20–29)
Calcium: 9.2 mg/dL (ref 8.7–10.2)
Chloride: 103 mmol/L (ref 96–106)
Creatinine, Ser: 1.05 mg/dL — ABNORMAL HIGH (ref 0.57–1.00)
Globulin, Total: 2.5 g/dL (ref 1.5–4.5)
Glucose: 71 mg/dL (ref 70–99)
Sodium: 140 mmol/L (ref 134–144)
Total Protein: 6.6 g/dL (ref 6.0–8.5)
eGFR: 66 mL/min/{1.73_m2} (ref >59)

## 2021-07-15 LAB — LIPID PANEL
Chol/HDL Ratio: 3.8 ratio (ref 0.0–4.4)
Cholesterol, Total: 195 mg/dL (ref 100–199)
HDL: 52 mg/dL (ref >39)
LDL Chol Calc (NIH): 119 mg/dL — ABNORMAL HIGH (ref 0–99)
Triglycerides: 137 mg/dL (ref 0–149)
VLDL Cholesterol Cal: 24 mg/dL (ref 5–40)

## 2021-07-15 LAB — CBC WITH DIFFERENTIAL/PLATELET
Basophils Absolute: 0 10*3/uL (ref 0.0–0.2)
Basos: 1 %
EOS (ABSOLUTE): 0 10*3/uL (ref 0.0–0.4)
Eos: 1 %
Hematocrit: 39 % (ref 34.0–46.6)
Hemoglobin: 12.4 g/dL (ref 11.1–15.9)
Immature Grans (Abs): 0 10*3/uL (ref 0.0–0.1)
Immature Granulocytes: 0 %
Lymphocytes Absolute: 2 10*3/uL (ref 0.7–3.1)
Lymphs: 40 %
MCH: 28.9 pg (ref 26.6–33.0)
MCHC: 31.8 g/dL (ref 31.5–35.7)
MCV: 91 fL (ref 79–97)
Monocytes Absolute: 0.5 10*3/uL (ref 0.1–0.9)
Monocytes: 10 %
Neutrophils Absolute: 2.4 10*3/uL (ref 1.4–7.0)
Neutrophils: 48 %
Platelets: 281 10*3/uL (ref 150–450)
RBC: 4.29 x10E6/uL (ref 3.77–5.28)
RDW: 12.9 % (ref 11.7–15.4)
WBC: 5.1 10*3/uL (ref 3.4–10.8)

## 2021-07-15 LAB — TSH: TSH: 0.512 u[IU]/mL (ref 0.450–4.500)

## 2021-07-15 LAB — HEMOGLOBIN A1C
Est. average glucose Bld gHb Est-mCnc: 111 mg/dL
Hgb A1c MFr Bld: 5.5 % (ref 4.8–5.6)

## 2021-07-15 LAB — CARDIOVASCULAR RISK ASSESSMENT

## 2021-07-16 ENCOUNTER — Encounter: Payer: Self-pay | Admitting: Nurse Practitioner

## 2021-07-17 ENCOUNTER — Encounter: Payer: Self-pay | Admitting: Student

## 2021-07-17 ENCOUNTER — Other Ambulatory Visit (HOSPITAL_COMMUNITY): Payer: Self-pay

## 2021-07-17 ENCOUNTER — Other Ambulatory Visit: Payer: Self-pay

## 2021-07-17 DIAGNOSIS — R7301 Impaired fasting glucose: Secondary | ICD-10-CM

## 2021-07-18 ENCOUNTER — Other Ambulatory Visit: Payer: Self-pay

## 2021-07-18 ENCOUNTER — Other Ambulatory Visit: Payer: 59

## 2021-07-18 DIAGNOSIS — R7301 Impaired fasting glucose: Secondary | ICD-10-CM | POA: Diagnosis not present

## 2021-07-18 LAB — COMPREHENSIVE METABOLIC PANEL
ALT: 12 IU/L (ref 0–32)
AST: 20 IU/L (ref 0–40)
Albumin/Globulin Ratio: 1.8 (ref 1.2–2.2)
Albumin: 4.6 g/dL (ref 3.8–4.8)
Alkaline Phosphatase: 50 IU/L (ref 44–121)
BUN/Creatinine Ratio: 12 (ref 9–23)
BUN: 13 mg/dL (ref 6–24)
Bilirubin Total: 0.3 mg/dL (ref 0.0–1.2)
CO2: 23 mmol/L (ref 20–29)
Calcium: 9.7 mg/dL (ref 8.7–10.2)
Chloride: 104 mmol/L (ref 96–106)
Creatinine, Ser: 1.06 mg/dL — ABNORMAL HIGH (ref 0.57–1.00)
Globulin, Total: 2.5 g/dL (ref 1.5–4.5)
Glucose: 87 mg/dL (ref 70–99)
Potassium: 4.4 mmol/L (ref 3.5–5.2)
Sodium: 140 mmol/L (ref 134–144)
Total Protein: 7.1 g/dL (ref 6.0–8.5)
eGFR: 65 mL/min/{1.73_m2} (ref >59)

## 2021-07-19 ENCOUNTER — Ambulatory Visit (INDEPENDENT_AMBULATORY_CARE_PROVIDER_SITE_OTHER): Payer: 59 | Admitting: Clinical

## 2021-07-19 ENCOUNTER — Telehealth (HOSPITAL_COMMUNITY): Payer: 59 | Admitting: Psychiatry

## 2021-07-19 DIAGNOSIS — F331 Major depressive disorder, recurrent, moderate: Secondary | ICD-10-CM

## 2021-07-19 DIAGNOSIS — F4310 Post-traumatic stress disorder, unspecified: Secondary | ICD-10-CM

## 2021-07-19 DIAGNOSIS — F419 Anxiety disorder, unspecified: Secondary | ICD-10-CM

## 2021-07-19 NOTE — Progress Notes (Signed)
° °  THERAPIST PROGRESS NOTE  Session Time: 1pm  Participation Level: Active  Behavioral Response: Casual and NeatAlertEuthymic  Type of Therapy: Individual Therapy  Treatment Goals addressed: Coping  Interventions: Supportive  Summary:  Pt presents today in a euthymic mood. Pt reports improved mood, decreased anxiety and increased energy level. Pt denies any psychosis, no SI/HI reported. Pt states she is looking forward to spending time with family for the holidays and states this is the first year she has felt comfortable engaging with others. Per pt report, she has been able to attend social events or go into public places alone with little to no anxiety. Pt reports since October she has been attending [facility], where she is a patient of Dr. Angie Fava. According to pt, on 11/18 physician reports she tested positive for benzoylecgonine. However, pt says prior to that visit she had never received a positive test. Additionally, pt states her last test on 12/16 was negative. Pt denies any history of drug or alcohol use. During todays session, pt did not appear to be under the influence of any substance.  Suicidal/Homicidal: Pt denies SI/HI. Pt denies any psychosis and no safety concerns reported. Pt encouraged to call 911 or go to closest ED in the event of an emergency.  Therapist Response: Assessed for changes in mood, behavior and daily functioning. According to pt, she is taking psychotropic medications as prescribed and noticing improvement in mood, energy level and denies any hallucinations. Assessed for any safety concerns. Pt denies any concerns. Verbally praised pt for continued progress toward achieving treatment goals. Assisted pt in identifying strengths and areas of improvement with managing anxiety. Per pt report, riding her peloton bike each day before work is assisting with improving overall mood.  Plan: Return again in 2 weeks.  Diagnosis: Axis I: PTSD                                      Moderate episode major depressive disorder                                     Anxiety    Axis II: No diagnosis    Yvette Rack, LCSW 07/19/2021

## 2021-07-20 ENCOUNTER — Encounter: Payer: Self-pay | Admitting: Nurse Practitioner

## 2021-07-20 ENCOUNTER — Other Ambulatory Visit: Payer: 59

## 2021-07-24 DIAGNOSIS — M5431 Sciatica, right side: Secondary | ICD-10-CM | POA: Diagnosis not present

## 2021-07-24 DIAGNOSIS — M5136 Other intervertebral disc degeneration, lumbar region: Secondary | ICD-10-CM | POA: Diagnosis not present

## 2021-07-24 DIAGNOSIS — M5432 Sciatica, left side: Secondary | ICD-10-CM | POA: Diagnosis not present

## 2021-07-24 DIAGNOSIS — M545 Low back pain, unspecified: Secondary | ICD-10-CM | POA: Diagnosis not present

## 2021-07-24 DIAGNOSIS — G8929 Other chronic pain: Secondary | ICD-10-CM | POA: Diagnosis not present

## 2021-07-24 DIAGNOSIS — G894 Chronic pain syndrome: Secondary | ICD-10-CM | POA: Diagnosis not present

## 2021-07-24 DIAGNOSIS — Z79891 Long term (current) use of opiate analgesic: Secondary | ICD-10-CM | POA: Diagnosis not present

## 2021-07-24 DIAGNOSIS — M48062 Spinal stenosis, lumbar region with neurogenic claudication: Secondary | ICD-10-CM | POA: Diagnosis not present

## 2021-07-24 DIAGNOSIS — M542 Cervicalgia: Secondary | ICD-10-CM | POA: Diagnosis not present

## 2021-07-25 ENCOUNTER — Other Ambulatory Visit (HOSPITAL_COMMUNITY): Payer: Self-pay

## 2021-07-25 ENCOUNTER — Encounter: Payer: Self-pay | Admitting: Nurse Practitioner

## 2021-07-25 ENCOUNTER — Other Ambulatory Visit: Payer: Self-pay | Admitting: Nurse Practitioner

## 2021-07-25 DIAGNOSIS — M549 Dorsalgia, unspecified: Secondary | ICD-10-CM

## 2021-07-25 MED ORDER — HYDROCODONE-ACETAMINOPHEN 7.5-325 MG PO TABS
ORAL_TABLET | ORAL | 0 refills | Status: DC
  Filled 2021-07-25: qty 90, 30d supply, fill #0

## 2021-07-30 ENCOUNTER — Other Ambulatory Visit (HOSPITAL_COMMUNITY): Payer: Self-pay

## 2021-08-01 ENCOUNTER — Other Ambulatory Visit (HOSPITAL_COMMUNITY): Payer: Self-pay

## 2021-08-02 ENCOUNTER — Other Ambulatory Visit (HOSPITAL_COMMUNITY): Payer: Self-pay

## 2021-08-02 ENCOUNTER — Telehealth (HOSPITAL_COMMUNITY): Payer: Self-pay

## 2021-08-02 DIAGNOSIS — F419 Anxiety disorder, unspecified: Secondary | ICD-10-CM

## 2021-08-02 DIAGNOSIS — F4310 Post-traumatic stress disorder, unspecified: Secondary | ICD-10-CM

## 2021-08-02 MED ORDER — ALPRAZOLAM 0.5 MG PO TABS
0.5000 mg | ORAL_TABLET | Freq: Two times a day (BID) | ORAL | 1 refills | Status: DC | PRN
  Filled 2021-08-04: qty 60, 30d supply, fill #0
  Filled 2021-09-06: qty 60, 30d supply, fill #1

## 2021-08-02 NOTE — Telephone Encounter (Signed)
Patient called regarding her Alprazolam (Xanax) 0.5mg . #45 was sent in on 06/26/21. Patient stated that she takes it BID and requested that more than 45 tablets be sent in. She stated that she has to get it refilled every 23 days at the pharmacy. Patient has a followup appointment scheduled for 09/25/21. Please review and advise. Thank you

## 2021-08-02 NOTE — Telephone Encounter (Signed)
I will send a new prescription to [facility] with 60 pills a month.

## 2021-08-04 ENCOUNTER — Encounter: Payer: Self-pay | Admitting: Gastroenterology

## 2021-08-04 ENCOUNTER — Other Ambulatory Visit (HOSPITAL_COMMUNITY): Payer: Self-pay

## 2021-08-04 NOTE — Telephone Encounter (Signed)
NOTIFIED PATIENT - PATIENT DIDN'T PICK UP PHONE SO WRITER LVM

## 2021-08-07 ENCOUNTER — Ambulatory Visit: Payer: 59 | Admitting: Student

## 2021-08-07 ENCOUNTER — Encounter: Payer: Self-pay | Admitting: Physical Medicine & Rehabilitation

## 2021-08-09 ENCOUNTER — Ambulatory Visit (INDEPENDENT_AMBULATORY_CARE_PROVIDER_SITE_OTHER): Payer: 59 | Admitting: Clinical

## 2021-08-09 ENCOUNTER — Other Ambulatory Visit (HOSPITAL_COMMUNITY): Payer: Self-pay

## 2021-08-09 ENCOUNTER — Other Ambulatory Visit: Payer: Self-pay

## 2021-08-09 DIAGNOSIS — F4310 Post-traumatic stress disorder, unspecified: Secondary | ICD-10-CM | POA: Diagnosis not present

## 2021-08-09 DIAGNOSIS — F419 Anxiety disorder, unspecified: Secondary | ICD-10-CM

## 2021-08-09 DIAGNOSIS — F331 Major depressive disorder, recurrent, moderate: Secondary | ICD-10-CM | POA: Diagnosis not present

## 2021-08-09 MED ORDER — EMGALITY 120 MG/ML ~~LOC~~ SOAJ
SUBCUTANEOUS | 1 refills | Status: DC
  Filled 2021-08-09: qty 3, 30d supply, fill #0
  Filled 2021-08-15: qty 1, 30d supply, fill #0
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
## 2021-08-10 ENCOUNTER — Other Ambulatory Visit (HOSPITAL_COMMUNITY): Payer: Self-pay

## 2021-08-10 ENCOUNTER — Ambulatory Visit (AMBULATORY_SURGERY_CENTER): Payer: 59 | Admitting: Gastroenterology

## 2021-08-10 ENCOUNTER — Encounter: Payer: Self-pay | Admitting: Gastroenterology

## 2021-08-10 VITALS — BP 98/62 | HR 72 | Temp 97.9°F | Resp 18 | Ht 64.0 in | Wt 225.0 lb

## 2021-08-10 DIAGNOSIS — K2100 Gastro-esophageal reflux disease with esophagitis, without bleeding: Secondary | ICD-10-CM

## 2021-08-10 DIAGNOSIS — K640 First degree hemorrhoids: Secondary | ICD-10-CM

## 2021-08-10 DIAGNOSIS — D123 Benign neoplasm of transverse colon: Secondary | ICD-10-CM | POA: Diagnosis not present

## 2021-08-10 DIAGNOSIS — K2970 Gastritis, unspecified, without bleeding: Secondary | ICD-10-CM | POA: Diagnosis not present

## 2021-08-10 DIAGNOSIS — K6389 Other specified diseases of intestine: Secondary | ICD-10-CM

## 2021-08-10 DIAGNOSIS — K209 Esophagitis, unspecified without bleeding: Secondary | ICD-10-CM | POA: Diagnosis not present

## 2021-08-10 DIAGNOSIS — K625 Hemorrhage of anus and rectum: Secondary | ICD-10-CM

## 2021-08-10 DIAGNOSIS — K581 Irritable bowel syndrome with constipation: Secondary | ICD-10-CM | POA: Diagnosis not present

## 2021-08-10 DIAGNOSIS — R1310 Dysphagia, unspecified: Secondary | ICD-10-CM | POA: Diagnosis not present

## 2021-08-10 DIAGNOSIS — K589 Irritable bowel syndrome without diarrhea: Secondary | ICD-10-CM | POA: Diagnosis not present

## 2021-08-10 DIAGNOSIS — K219 Gastro-esophageal reflux disease without esophagitis: Secondary | ICD-10-CM | POA: Diagnosis not present

## 2021-08-10 DIAGNOSIS — K2950 Unspecified chronic gastritis without bleeding: Secondary | ICD-10-CM | POA: Diagnosis not present

## 2021-08-10 MED ORDER — SODIUM CHLORIDE 0.9 % IV SOLN: 500.0000 mL | Freq: Once | INTRAVENOUS | Status: DC

## 2021-08-10 NOTE — Progress Notes (Signed)
VS-CW 

## 2021-08-10 NOTE — Op Note (Signed)
[facility] Patient Name: Desiree Russell Procedure Date: 08/10/2021 9:15 AM MRN: [id number] Endoscopist: Jackquline Denmark , MD Age: 51 Referring MD:  Date of Birth: 1970/12/26 Gender: Female Account #: [id number] Procedure:                Upper GI endoscopy Indications:              Dysphagia. GERD Medicines:                Monitored Anesthesia Care Procedure:                Pre-Anesthesia Assessment:                           - Prior to the procedure, a History and Physical                            was performed, and patient medications and                            allergies were reviewed. The patient's tolerance of                            previous anesthesia was also reviewed. The risks                            and benefits of the procedure and the sedation                            options and risks were discussed with the patient.                            All questions were answered, and informed consent                            was obtained. Prior Anticoagulants: Plavix was held                            5 days prior. ASA Grade Assessment: II - A patient                            with mild systemic disease. After reviewing the                            risks and benefits, the patient was deemed in                            satisfactory condition to undergo the procedure.                           After obtaining informed consent, the endoscope was                            passed under direct vision. Throughout the  procedure, the patient's blood pressure, pulse, and                            oxygen saturations were monitored continuously. The                            GIF [id number] was introduced through the                            mouth, and advanced to the second part of duodenum.                            The upper GI endoscopy was accomplished without                            difficulty. The patient tolerated the  procedure                            well. Scope In: Scope Out: Findings:                 No endoscopic abnormality was evident in the                            esophagus to explain the patient's complaint of                            dysphagia. It was decided, however, to proceed with                            dilation of the entire esophagus. The scope was                            withdrawn. Dilation was performed with a Maloney                            dilator with mild resistance at 50 Fr. Biopsies                            were obtained from the proximal and distal                            esophagus with cold forceps for histology of                            suspected eosinophilic esophagitis.                           Localized mild inflammation characterized by                            erythema and granularity was found in the gastric                            antrum. Biopsies  were taken with a cold forceps for                            histology. No ulcers.                           The examined duodenum was normal. Complications:            No immediate complications. Estimated Blood Loss:     Estimated blood loss: none. Impression:               - No endoscopic esophageal abnormality to explain                            patient's dysphagia. Esophagus dilated. Dilated.                            Biopsied.                           - Gastritis. Biopsied. Recommendation:           - Patient has a contact number available for                            emergencies. The signs and symptoms of potential                            delayed complications were discussed with the                            patient. Return to normal activities tomorrow.                            Written discharge instructions were provided to the                            patient.                           - Resume previous diet.                           - Continue present  medications.                           - Await pathology results.                           - Resume Plavix (clopidogrel) at prior dose in 3                            days.                           - The findings and recommendations were discussed  with the patient's family. Jackquline Denmark, MD 08/10/2021 10:00:42 AM This report has been signed electronically.

## 2021-08-10 NOTE — Progress Notes (Signed)
Sedate, gd SR, tolerated procedure well, VSS, report to RN 

## 2021-08-10 NOTE — Progress Notes (Signed)
Chief Complaint: For GI work-up.  Referring Provider:  Rip Harbour, NP      ASSESSMENT AND PLAN;   #1. GERD with eso dysphagia  #2. IBS-C with rectal bleeding.   Plan: -Switch nexium to protonix 40mg  po QD #30, 11 refills -EGD with dil/colon (2 day prep)after holding plavix 5 days and neurology or cardio clearence -Can continue ASA throughout. -Increase water intake. -For now, continue senna 3/day   I discussed EGD/Colonoscopy- the indications, risks, alternatives and potential complications including, but not limited to, bleeding, infection, reaction to medication, damage to internal organs, cardiac and/or pulmonary problems, and perforation requiring surgery (1 to 2 in 1000). The possibility that significant findings could be missed was explained. All ? were answered. The patient gives consent to proceed. HPI:    Desiree Russell is a 51 y.o. female  With H/O CVA (on ASA/plavix), fibromyalgia, HLD, anxiety, migraines, hypothyroidism, H/O ectopic pregnancy s/p R salpingo-oophorectomy with LOA, hysterectomy, cholecystectomy  C/O intermittent dysphagia to both solids and liquids x 1 year, per patient not related to stroke, mid chest, with occasional heartburn despite Nexium.  No odynophagia.  No nausea or vomiting.  Has longstanding history of constipation "all my life" and has been on senna for over 4-5 years.  She takes 3/day.  Rare rectal bleeding.  She does pass pellet-like stools.  Also associated abdominal bloating, lower abdominal crampy pain which gets better with defecation.  No weight loss or loss of appetite.  No fever chills or night sweats.  No jaundice dark urine or pale stools.  She does take calcium supplements.  No significant nonsteroidals.  She previously has been on narcotics which has been stopped over the last 1 year.  No sodas, chocolates, chewing gums, artificial sweeteners and candy.     Past GI work-up: EGD 09/2012 -Gastric ulcer. Bx- neg for  HP or malignancy -Mild gastritis  Colonoscopy12/2013 -Fair to poor prep -Mild melanosis coli -Highly redundant.  SH- Therapist, sports from [employer]. Mom, older sister with significant constipation  Past Medical History:  Diagnosis Date   Allergy    Anemia    Anxiety    Arthritis    Dyslipidemia    Fibromyalgia    GERD (gastroesophageal reflux disease)    Headache    Migraines    Pericarditis    Stroke ([place]) 12/2020   Tachycardia    TIA (transient ischemic attack)    Recurrent    Past Surgical History:  Procedure Laterality Date   ABDOMINAL HYSTERECTOMY     CHOLECYSTECTOMY     CHOLECYSTECTOMY, LAPAROSCOPIC     COLONOSCOPY     UPPER GASTROINTESTINAL ENDOSCOPY      Family History  Problem Relation Age of Onset   Hypertension Mother    Hypertension Father    Heart attack Father 52   Healthy Sister    Healthy Sister    Healthy Sister    Hypertension Brother    Diabetes type II Brother    COPD Brother    Healthy Brother    Healthy Brother    Breast cancer Maternal Aunt    Throat cancer Maternal Uncle    Hypertension Maternal Grandmother    Diabetes Paternal Geophysicist/field seismologist    Healthy Daughter    Healthy Son    Healthy Son    Healthy Son    Colon cancer Neg Hx    Pancreatic cancer Neg Hx    Stomach cancer Neg Hx    Liver disease Neg Hx  Esophageal cancer Neg Hx    Rectal cancer Neg Hx     Social History   Tobacco Use   Smoking status: Never   Smokeless tobacco: Never  Vaping Use   Vaping Use: Never used  Substance Use Topics   Alcohol use: Yes    Comment: occ    Drug use: No    Current Outpatient Medications  Medication Sig Dispense Refill   ALPRAZolam (XANAX) 0.5 MG tablet Take 1 tablet by mouth 2 times daily as needed for anxiety. 60 tablet 1   aspirin 81 MG EC tablet Take 1 tablet (81 mg total) by mouth daily. 21 tablet 0   clopidogrel (PLAVIX) 75 MG tablet Take 1 tablet by mouth daily. 90 tablet 1   furosemide (LASIX) 20 MG tablet Take 1 tablet by  mouth daily as needed. 90 tablet 3   gabapentin (NEURONTIN) 600 MG tablet Take 1 tablet  by mouth at bedtime. 90 tablet 1   HYDROcodone-acetaminophen (NORCO) 7.5-325 MG tablet Take 1 tablet by mouth every 8 hours 90 tablet 0   loratadine (CLARITIN) 10 MG tablet Take 10 mg by mouth daily.     metoprolol succinate (TOPROL-XL) 25 MG 24 hr tablet Take 1 tablet by mouth daily. 90 tablet 3   Multiple Vitamin (MULTIVITAMIN WITH MINERALS) TABS tablet Take 1 tablet by mouth daily.     OLANZapine-FLUoxetine (SYMBYAX) 3-25 MG capsule Take 1 capsule by mouth every evening. 30 capsule 2   pantoprazole (PROTONIX) 40 MG tablet Take 1 tablet by mouth daily. 30 tablet 11   Semaglutide,0.25 or 0.5MG /DOS, (OZEMPIC, 0.25 OR 0.5 MG/DOSE,) 2 MG/1.5ML SOPN Inject 0.5 mg into the skin once a week for four weeks as directed 3 mL 0   senna-docusate (SENOKOT-S) 8.6-50 MG tablet Take 2 tablets by mouth daily.     vortioxetine HBr (TRINTELLIX) 10 MG TABS tablet Take 1 tablet by mouth daily. 30 tablet 2   acetaminophen (TYLENOL) 325 MG tablet Take 650 mg by mouth every 6 (six) hours as needed for mild pain, fever or headache.     albuterol (VENTOLIN HFA) 108 (90 Base) MCG/ACT inhaler INHALE 2 PUFFS INTO THE LUNGS EVERY 4 HOURS AS NEEDED FOR WHEEZING. (Patient taking differently: Inhale 2 puffs into the lungs every 4 (four) hours as needed for wheezing.) 8.5 g 0   azelastine (ASTELIN) 0.1 % nasal spray Place 1 spray into both nostrils 2 (two) times daily. Use in each nostril as directed 30 mL 12   Calcium-Vitamin D-Vitamin K 650-12.5-40 MG-MCG-MCG CHEW Chew 1 tablet by mouth daily.     cephALEXin (KEFLEX) 250 MG capsule Take 1 capsule by mouth once daily after relations as directed. 30 capsule 3   cyclobenzaprine (FLEXERIL) 10 MG tablet Take 5-10 mg by mouth at bedtime as needed for muscle spasms.     Galcanezumab-gnlm (EMGALITY) 120 MG/ML SOSY Inject 120 mg subcutaneously every 28 (twenty-eight) days 3 mL 1   oxybutynin  (DITROPAN-XL) 5 MG 24 hr tablet Take 1 (one) tablet by mouth at night for overactive bladder/urinary frequency.  Replaces tolterodine. 30 tablet 2   Ubrogepant (UBRELVY) 100 MG TABS TAKE  1/2 TABLET AT HEADACHE ONSET. MAY REPEAT DOSE IN TWO HOURS IF NO IMPROVEMENT. DO NOT EXCEED MORE THAN TWO TABLETS IN 24 HOURS. (Patient taking differently: Take 50-100 mg by mouth See admin instructions. Take 50mg  at headache onset, may repeat dose in two hours if no improvement. Do not excess more than 200mg  in 24 hours) 18 tablet 1  Current Facility-Administered Medications  Medication Dose Route Frequency Provider Last Rate Last Admin   0.9 %  sodium chloride infusion  500 mL Intravenous Once Jackquline Denmark, MD        Allergies  Allergen Reactions   Codeine Hives and Rash   Morphine Hives and Other (See Comments)    Headaches, also   Morphine And Related Hives   Chlorhexidine Hives and Rash         Review of Systems:  Constitutional: Denies fever, chills, diaphoresis, appetite change and fatigue.  HEENT: Denies photophobia, eye pain, redness, hearing loss, ear pain, congestion, sore throat, rhinorrhea, sneezing, mouth sores, neck pain, neck stiffness and tinnitus.   Respiratory: Denies SOB, DOE, cough, chest tightness,  and wheezing.   Cardiovascular: Denies chest pain, palpitations and leg swelling.  Genitourinary: Denies dysuria, urgency, frequency, hematuria, flank pain and difficulty urinating.  Musculoskeletal: Denies myalgias, back pain, joint swelling, arthralgias and gait problem.  Skin: No rash.  Neurological: Denies dizziness, seizures, syncope, weakness, light-headedness, numbness and headaches.  Hematological: Denies adenopathy. Easy bruising, personal or family bleeding history  Psychiatric/Behavioral: No anxiety or depression     Physical Exam:    BP 131/64    Pulse 82    Temp 97.9 F (36.6 C) (Temporal)    Ht 5\' 4"  (1.626 m)    Wt 225 lb (102.1 kg)    SpO2 98%    BMI 38.62 kg/m   Wt Readings from Last 3 Encounters:  08/10/21 225 lb (102.1 kg)  07/14/21 233 lb 9.6 oz (106 kg)  05/30/21 232 lb 6.4 oz (105.4 kg)   Constitutional:  Well-developed, in no acute distress. Psychiatric: Normal mood and affect. Behavior is normal. HEENT: Pupils normal.  Conjunctivae are normal. No scleral icterus. Neck supple.  Cardiovascular: Normal rate, regular rhythm. No edema Pulmonary/chest: Effort normal and breath sounds normal. No wheezing, rales or rhonchi. Abdominal: Soft, nondistended. Nontender. Bowel sounds active throughout. There are no masses palpable. No hepatomegaly. Rectal: Deferred Neurological: Alert and oriented to person place and time. Skin: Skin is warm and dry. No rashes noted.  Data Reviewed: I have personally reviewed following labs and imaging studies  CBC: CBC Latest Ref Rng & Units 07/14/2021 03/29/2021 03/10/2021  WBC 3.4 - 10.8 x10E3/uL 5.1 7.7 -  Hemoglobin 11.1 - 15.9 g/dL 12.4 12.6 13.6  Hematocrit 34.0 - 46.6 % 39.0 39.1 40.0  Platelets 150 - 450 x10E3/uL 281 200 -    CMP: CMP Latest Ref Rng & Units 07/18/2021 07/14/2021 03/29/2021  Glucose 70 - 99 mg/dL 87 71 104(H)  BUN 6 - 24 mg/dL 13 13 15   Creatinine 0.57 - 1.00 mg/dL 1.06(H) 1.05(H) 0.91  Sodium 134 - 144 mmol/L 140 140 136  Potassium 3.5 - 5.2 mmol/L 4.4 CANCELED 3.9  Chloride 96 - 106 mmol/L 104 103 102  CO2 20 - 29 mmol/L 23 23 26   Calcium 8.7 - 10.2 mg/dL 9.7 9.2 9.6  Total Protein 6.0 - 8.5 g/dL 7.1 6.6 7.3  Total Bilirubin 0.0 - 1.2 mg/dL 0.3 <0.2 0.5  Alkaline Phos 44 - 121 IU/L 50 46 51  AST 0 - 40 IU/L 20 36 25  ALT 0 - 32 IU/L 12 16 17     GFR: CrCl cannot be calculated (Patient's most recent lab result is older than the maximum 21 days allowed.). Liver Function Tests: No results for input(s): AST, ALT, ALKPHOS, BILITOT, PROT, ALBUMIN in the last 168 hours. No results for input(s): LIPASE, AMYLASE in the last  168 hours. No results for input(s): AMMONIA in the last 168  hours. Coagulation Profile: No results for input(s): INR, PROTIME in the last 168 hours. HbA1C: No results for input(s): HGBA1C in the last 72 hours. Lipid Profile: No results for input(s): CHOL, HDL, LDLCALC, TRIG, CHOLHDL, LDLDIRECT in the last 72 hours. Thyroid Function Tests: No results for input(s): TSH, T4TOTAL, FREET4, T3FREE, THYROIDAB in the last 72 hours. Anemia Panel: No results for input(s): VITAMINB12, FOLATE, FERRITIN, TIBC, IRON, RETICCTPCT in the last 72 hours.  No results found for this or any previous visit (from the past 240 hour(s)).    Radiology Studies: No results found.    Carmell Austria, MD 08/10/2021, 9:14 AM  Cc: Rip Harbour, NP

## 2021-08-10 NOTE — Op Note (Signed)
Desiree Russell Patient Name: Desiree Russell Procedure Date: 08/10/2021 9:15 AM MRN: [id number] Endoscopist: Jackquline Denmark , MD Age: 51 Referring MD:  Date of Birth: April 14, 1971 Gender: Female Account #: [id number] Procedure:                Colonoscopy Indications:              Rectal bleeding. IBS-C Medicines:                Monitored Anesthesia Care Procedure:                Pre-Anesthesia Assessment:                           - Prior to the procedure, a History and Physical                            was performed, and patient medications and                            allergies were reviewed. The patient's tolerance of                            previous anesthesia was also reviewed. The risks                            and benefits of the procedure and the sedation                            options and risks were discussed with the patient.                            All questions were answered, and informed consent                            was obtained. Prior Anticoagulants: The patient has                            taken Plavix (clopidogrel), last dose was 5 days                            prior to procedure. ASA Grade Assessment: II - A                            patient with mild systemic disease. After reviewing                            the risks and benefits, the patient was deemed in                            satisfactory condition to undergo the procedure.                           After obtaining informed consent, the colonoscope  was passed under direct vision. Throughout the                            procedure, the patient's blood pressure, pulse, and                            oxygen saturations were monitored continuously. The                            Olympus [id number]-[id number] (#[id number]) Colonoscope was                            introduced through the anus and advanced to the 2                            cm into the ileum. The  colonoscopy was performed                            without difficulty. The patient tolerated the                            procedure well. The quality of the bowel                            preparation was good. The terminal ileum, ileocecal                            valve, appendiceal orifice, and rectum were                            photographed. Scope In: 9:38:32 AM Scope Out: 9:55:45 AM Scope Withdrawal Time: 0 hours 10 minutes 53 seconds  Total Procedure Duration: 0 hours 17 minutes 13 seconds  Findings:                 A 6 mm polyp was found in the distal transverse                            colon. The polyp was sessile. The polyp was removed                            with a cold snare. Resection and retrieval were                            complete.                           A diffuse area of mild melanosis was found in the                            entire colon.                           A few rare (2-3) small-mouthed diverticula were  found in the sigmoid colon.                           Non-bleeding internal hemorrhoids were found during                            retroflexion. The hemorrhoids were small and Grade                            I (internal hemorrhoids that do not prolapse).                           The terminal ileum appeared normal.                           The exam was otherwise without abnormality on                            direct and retroflexion views. Complications:            No immediate complications. Estimated Blood Loss:     Estimated blood loss: none. Impression:               - One 6 mm polyp in the distal transverse colon,                            removed with a cold snare. Resected and retrieved.                           - Mild melanosis coli.                           - Minimal sigmoid diverticulosis.                           - Non-bleeding internal hemorrhoids.                           - The examined  portion of the ileum was normal.                           - The examination was otherwise normal on direct                            and retroflexion views. Recommendation:           - Patient has a contact number available for                            emergencies. The signs and symptoms of potential                            delayed complications were discussed with the                            patient. Return to normal activities tomorrow.  Written discharge instructions were provided to the                            patient.                           - Resume previous diet.                           - Continue present medications.                           - Resume Plavix (clopidogrel) at prior dose in 3                            days.                           - Await pathology results.                           - Miralax 1 capful (17 grams) in 8 ounces of water                            PO daily.                           - The findings and recommendations were discussed                            with the patient's family. Jackquline Denmark, MD 08/10/2021 10:05:29 AM This report has been signed electronically.

## 2021-08-10 NOTE — Patient Instructions (Signed)
HANDOUTS PROVIDED ON: GASTRITIS, POLYPS, DIVERTICULOSIS, & HEMORRHOIDS  The polyp removed/biopsies taken today have been sent for pathology.  The results can take 1-3 weeks to receive.  When your next colonoscopy should occur will be based on the pathology results.    You may resume your previous diet.  You may resume all regular medications today EXCEPT Plavix which you may resume Sunday 08-13-21.  Begin using 1 capful (17g) of Miralax in 8 ounces of water each day.  Thank you for allowing [name] to care for you today!!!   YOU HAD AN ENDOSCOPIC PROCEDURE TODAY AT [facility]:   Refer to the procedure report that was given to you for any specific questions about what was found during the examination.  If the procedure report does not answer your questions, please call your gastroenterologist to clarify.  If you requested that your care partner not be given the details of your procedure findings, then the procedure report has been included in a sealed envelope for you to review at your convenience later.  YOU SHOULD EXPECT: Some feelings of bloating in the abdomen. Passage of more gas than usual.  Walking can help get rid of the air that was put into your GI tract during the procedure and reduce the bloating. If you had a lower endoscopy (such as a colonoscopy or flexible sigmoidoscopy) you may notice spotting of blood in your stool or on the toilet paper. If you underwent a bowel prep for your procedure, you may not have a normal bowel movement for a few days.  Please Note:  You might notice some irritation and congestion in your nose or some drainage.  This is from the oxygen used during your procedure.  There is no need for concern and it should clear up in a day or so.  SYMPTOMS TO REPORT IMMEDIATELY:  Following lower endoscopy (colonoscopy or flexible sigmoidoscopy):  Excessive amounts of blood in the stool  Significant tenderness or worsening of abdominal pains  Swelling of the  abdomen that is new, acute  Fever of 100F or higher  Following upper endoscopy (EGD)  Vomiting of blood or coffee ground material  New chest pain or pain under the shoulder blades  Painful or persistently difficult swallowing  New shortness of breath  Fever of 100F or higher  Black, tarry-looking stools  For urgent or emergent issues, a gastroenterologist can be reached at any hour by calling [phone]. Do not use MyChart messaging for urgent concerns.    DIET:  We do recommend a small meal at first, but then you may proceed to your regular diet.  Drink plenty of fluids but you should avoid alcoholic beverages for 24 hours.  ACTIVITY:  You should plan to take it easy for the rest of today and you should NOT DRIVE or use heavy machinery until tomorrow (because of the sedation medicines used during the test).    FOLLOW UP: Our staff will call the number listed on your records Monday between 7:15 am and 8:15 am following your procedure to check on you and address any questions or concerns that you may have regarding the information given to you following your procedure. If we do not reach you, we will leave a message.  We will attempt to reach you two times.  During this call, we will ask if you have developed any symptoms of COVID 19. If you develop any symptoms (ie: fever, flu-like symptoms, shortness of breath, cough etc.) before then, please  call [phone].  If you test positive for Covid 19 in the 2 weeks post procedure, please call and report this information to [name].    If any biopsies were taken you will be contacted by phone or by letter within the next 1-3 weeks.  Please call us at [phone] if you have not heard about the biopsies in 3 weeks.    SIGNATURES/CONFIDENTIALITY: You and/or your care partner have signed paperwork which will be entered into your electronic medical record.  These signatures attest to the fact that that the information above on your After Visit  Summary has been reviewed and is understood.  Full responsibility of the confidentiality of this discharge information lies with you and/or your care-partner.

## 2021-08-12 ENCOUNTER — Other Ambulatory Visit (HOSPITAL_COMMUNITY): Payer: Self-pay

## 2021-08-14 ENCOUNTER — Telehealth: Payer: Self-pay | Admitting: *Deleted

## 2021-08-14 NOTE — Telephone Encounter (Signed)
°  Follow up Call-  Call back number 08/10/2021  Post procedure Call Back phone  # [phone]  Permission to leave phone message Yes  Some recent data might be hidden     Patient questions:  Do you have a fever, pain , or abdominal swelling? No. Pain Score  0 *  Have you tolerated food without any problems? Yes.    Have you been able to return to your normal activities? Yes.    Do you have any questions about your discharge instructions: Diet   No. Medications  No. Follow up visit  No.  Do you have questions or concerns about your Care? No.  Actions: * If pain score is 4 or above: No action needed, pain <4.

## 2021-08-15 ENCOUNTER — Other Ambulatory Visit: Payer: Self-pay | Admitting: Nurse Practitioner

## 2021-08-15 ENCOUNTER — Other Ambulatory Visit (HOSPITAL_COMMUNITY): Payer: Self-pay

## 2021-08-15 ENCOUNTER — Encounter: Payer: Self-pay | Admitting: Nurse Practitioner

## 2021-08-15 DIAGNOSIS — R110 Nausea: Secondary | ICD-10-CM

## 2021-08-15 DIAGNOSIS — G43009 Migraine without aura, not intractable, without status migrainosus: Secondary | ICD-10-CM

## 2021-08-15 MED ORDER — EMGALITY 120 MG/ML ~~LOC~~ SOAJ
SUBCUTANEOUS | 1 refills | Status: DC
  Filled 2021-08-15: qty 3, fill #0
  Filled 2021-09-06: qty 1, 30d supply, fill #0
  Filled 2021-09-06: qty 3, 90d supply, fill #0
  Filled 2021-10-09: qty 1, 30d supply, fill #1
  Filled 2021-11-06: qty 1, 30d supply, fill #2
  Filled 2021-12-03: qty 1, 30d supply, fill #3
  Filled 2022-01-01: qty 1, 30d supply, fill #4
  Filled 2022-02-05: qty 1, 30d supply, fill #5

## 2021-08-15 MED ORDER — ONDANSETRON HCL 4 MG PO TABS
4.0000 mg | ORAL_TABLET | Freq: Three times a day (TID) | ORAL | 3 refills | Status: DC | PRN
  Filled 2021-08-15: qty 30, 10d supply, fill #0
  Filled 2022-02-05: qty 30, 10d supply, fill #1
  Filled 2022-04-08: qty 30, 10d supply, fill #2

## 2021-08-16 ENCOUNTER — Other Ambulatory Visit (HOSPITAL_COMMUNITY): Payer: Self-pay

## 2021-08-16 DIAGNOSIS — G894 Chronic pain syndrome: Secondary | ICD-10-CM | POA: Diagnosis not present

## 2021-08-16 DIAGNOSIS — M542 Cervicalgia: Secondary | ICD-10-CM | POA: Diagnosis not present

## 2021-08-16 DIAGNOSIS — M5431 Sciatica, right side: Secondary | ICD-10-CM | POA: Diagnosis not present

## 2021-08-16 DIAGNOSIS — Z79891 Long term (current) use of opiate analgesic: Secondary | ICD-10-CM | POA: Diagnosis not present

## 2021-08-16 DIAGNOSIS — G8929 Other chronic pain: Secondary | ICD-10-CM | POA: Diagnosis not present

## 2021-08-16 DIAGNOSIS — M5432 Sciatica, left side: Secondary | ICD-10-CM | POA: Diagnosis not present

## 2021-08-16 DIAGNOSIS — M545 Low back pain, unspecified: Secondary | ICD-10-CM | POA: Diagnosis not present

## 2021-08-16 MED ORDER — HYDROCODONE-ACETAMINOPHEN 10-325 MG PO TABS
ORAL_TABLET | ORAL | 0 refills | Status: DC
  Filled 2021-08-16: qty 90, 30d supply, fill #0

## 2021-08-20 ENCOUNTER — Encounter: Payer: Self-pay | Admitting: Gastroenterology

## 2021-08-21 ENCOUNTER — Encounter: Payer: Self-pay | Admitting: Nurse Practitioner

## 2021-08-23 ENCOUNTER — Encounter: Payer: Self-pay | Admitting: Gastroenterology

## 2021-08-23 ENCOUNTER — Ambulatory Visit (HOSPITAL_COMMUNITY): Payer: 59 | Admitting: Clinical

## 2021-08-23 ENCOUNTER — Encounter: Payer: Self-pay | Admitting: Cardiology

## 2021-08-23 NOTE — Telephone Encounter (Signed)
Pt states that she has been having lots of acid reflux and heartburn the last 3 or 4 days; No change in diet; Taking tums; Pt questioning if she could increase her Protonix: Please advise

## 2021-08-28 NOTE — Telephone Encounter (Signed)
Increase Protonix 40 mg p.o. twice daily x 2 weeks and then reduce it to once a day RG

## 2021-09-01 DIAGNOSIS — J3089 Other allergic rhinitis: Secondary | ICD-10-CM | POA: Diagnosis not present

## 2021-09-01 DIAGNOSIS — F419 Anxiety disorder, unspecified: Secondary | ICD-10-CM | POA: Diagnosis not present

## 2021-09-01 DIAGNOSIS — G4700 Insomnia, unspecified: Secondary | ICD-10-CM | POA: Diagnosis not present

## 2021-09-01 DIAGNOSIS — F32A Depression, unspecified: Secondary | ICD-10-CM | POA: Diagnosis not present

## 2021-09-01 DIAGNOSIS — G4730 Sleep apnea, unspecified: Secondary | ICD-10-CM | POA: Diagnosis not present

## 2021-09-01 DIAGNOSIS — G4710 Hypersomnia, unspecified: Secondary | ICD-10-CM | POA: Diagnosis not present

## 2021-09-01 DIAGNOSIS — E669 Obesity, unspecified: Secondary | ICD-10-CM | POA: Diagnosis not present

## 2021-09-01 DIAGNOSIS — G2581 Restless legs syndrome: Secondary | ICD-10-CM | POA: Diagnosis not present

## 2021-09-03 ENCOUNTER — Encounter: Payer: Self-pay | Admitting: Nurse Practitioner

## 2021-09-04 ENCOUNTER — Other Ambulatory Visit: Payer: Self-pay | Admitting: Nurse Practitioner

## 2021-09-04 ENCOUNTER — Other Ambulatory Visit (HOSPITAL_COMMUNITY): Payer: Self-pay

## 2021-09-04 ENCOUNTER — Other Ambulatory Visit: Payer: Self-pay

## 2021-09-04 DIAGNOSIS — K2100 Gastro-esophageal reflux disease with esophagitis, without bleeding: Secondary | ICD-10-CM

## 2021-09-04 DIAGNOSIS — R7301 Impaired fasting glucose: Secondary | ICD-10-CM

## 2021-09-04 MED ORDER — OZEMPIC (1 MG/DOSE) 4 MG/3ML ~~LOC~~ SOPN
1.0000 mg | PEN_INJECTOR | SUBCUTANEOUS | 1 refills | Status: DC
  Filled 2021-09-04: qty 3, 28d supply, fill #0

## 2021-09-04 MED ORDER — PANTOPRAZOLE SODIUM 40 MG PO TBEC
40.0000 mg | DELAYED_RELEASE_TABLET | Freq: Every day | ORAL | 1 refills | Status: DC
  Filled 2021-09-04 – 2021-09-06 (×2): qty 90, 90d supply, fill #0
  Filled 2021-12-06: qty 90, 90d supply, fill #1

## 2021-09-05 ENCOUNTER — Other Ambulatory Visit (HOSPITAL_COMMUNITY): Payer: Self-pay

## 2021-09-06 ENCOUNTER — Other Ambulatory Visit (HOSPITAL_COMMUNITY): Payer: Self-pay

## 2021-09-07 ENCOUNTER — Other Ambulatory Visit (HOSPITAL_COMMUNITY): Payer: Self-pay

## 2021-09-07 ENCOUNTER — Other Ambulatory Visit: Payer: Self-pay

## 2021-09-07 ENCOUNTER — Ambulatory Visit (INDEPENDENT_AMBULATORY_CARE_PROVIDER_SITE_OTHER): Payer: 59 | Admitting: Clinical

## 2021-09-07 DIAGNOSIS — F4310 Post-traumatic stress disorder, unspecified: Secondary | ICD-10-CM

## 2021-09-07 DIAGNOSIS — F419 Anxiety disorder, unspecified: Secondary | ICD-10-CM | POA: Diagnosis not present

## 2021-09-07 DIAGNOSIS — F331 Major depressive disorder, recurrent, moderate: Secondary | ICD-10-CM | POA: Diagnosis not present

## 2021-09-07 MED ORDER — CLOPIDOGREL BISULFATE 75 MG PO TABS
ORAL_TABLET | ORAL | 1 refills | Status: DC
  Filled 2021-09-07 – 2021-09-15 (×2): qty 90, 90d supply, fill #0
  Filled 2021-12-20: qty 90, 90d supply, fill #1

## 2021-09-07 NOTE — Progress Notes (Signed)
° °  THERAPIST PROGRESS NOTE  Session Time: 9am  Participation Level: Active  Behavioral Response: Casual and NeatAlertEuthymic  Type of Therapy: Individual Therapy  Treatment Goals addressed: Coping  Interventions: CBT Virtual Visit via Video Note  I connected with [name] on 09/07/21 at  9:00 AM EST by a video enabled telemedicine application and verified that I am speaking with the correct person using two identifiers.  Location: Patient: home Provider: office   I discussed the limitations of evaluation and management by telemedicine and the availability of in person appointments. The patient expressed understanding and agreed to proceed.   I discussed the assessment and treatment plan with the patient. The patient was provided an opportunity to ask questions and all were answered. The patient agreed with the plan and demonstrated an understanding of the instructions.   The patient was advised to call back or seek an in-person evaluation if the symptoms worsen or if the condition fails to improve as anticipated.  I provided 45 minutes of non-face-to-face time during this encounter.  Summary: Pt present in euthymic mood. Pt says she has lost 10lbs and is exercising 5x per week. Pt says she has changed her diet and is noticing improvement in her mood. Pt reports engaging in leisurely reading and is socializing with friends and family more. Pt discussed being triggered by images or noises she hears. For instance, pt says she heard an ambulance siren and envisioned her husband in a car accident. Also, pt reports seeing a lake on TV and envisioning her grandson drowning. Pt states at times having difficulty distinguishing between what is reality vs illogical.  Suicidal/Homicidal: Pt denies SI/HI. Pt denies any psychosis and no safety concerns reported. Pt encouraged to call 911 or go to closest ED in the event of an emergency.  Therapist Response: Assessed for changes in mood,  behavior and daily functioning. Processed and modeled for pt how to redirect automatic negative thoughts. Discussed with pt putting thoughts on trial(fact vs feeling) to challenge irrational thoughts. Probed for coping strategies to help manage distressing events. Pt says the serenity prayer(accepting things I cannot change) applied to manage events out of her control.  Plan: Return again in 2 weeks.  Diagnosis: Axis I: PTSD                                     Moderate episode major depressive disorder                                     Anxiety    Axis II: No diagnosis    Yvette Rack, LCSW 09/07/2021

## 2021-09-08 ENCOUNTER — Other Ambulatory Visit (HOSPITAL_COMMUNITY): Payer: Self-pay

## 2021-09-08 DIAGNOSIS — G4733 Obstructive sleep apnea (adult) (pediatric): Secondary | ICD-10-CM | POA: Diagnosis not present

## 2021-09-09 DIAGNOSIS — G4733 Obstructive sleep apnea (adult) (pediatric): Secondary | ICD-10-CM | POA: Diagnosis not present

## 2021-09-12 ENCOUNTER — Other Ambulatory Visit (HOSPITAL_COMMUNITY): Payer: Self-pay

## 2021-09-12 DIAGNOSIS — M79671 Pain in right foot: Secondary | ICD-10-CM | POA: Diagnosis not present

## 2021-09-12 DIAGNOSIS — M25371 Other instability, right ankle: Secondary | ICD-10-CM | POA: Diagnosis not present

## 2021-09-12 DIAGNOSIS — M25571 Pain in right ankle and joints of right foot: Secondary | ICD-10-CM | POA: Diagnosis not present

## 2021-09-12 MED ORDER — PREDNISONE 10 MG PO TABS
ORAL_TABLET | ORAL | 0 refills | Status: DC
  Filled 2021-09-12: qty 14, 6d supply, fill #0

## 2021-09-15 ENCOUNTER — Other Ambulatory Visit (HOSPITAL_COMMUNITY): Payer: Self-pay

## 2021-09-20 DIAGNOSIS — E669 Obesity, unspecified: Secondary | ICD-10-CM | POA: Diagnosis not present

## 2021-09-20 DIAGNOSIS — G478 Other sleep disorders: Secondary | ICD-10-CM | POA: Diagnosis not present

## 2021-09-21 ENCOUNTER — Other Ambulatory Visit (HOSPITAL_COMMUNITY): Payer: Self-pay | Admitting: Student

## 2021-09-21 ENCOUNTER — Encounter: Payer: Self-pay | Admitting: Nurse Practitioner

## 2021-09-21 ENCOUNTER — Other Ambulatory Visit: Payer: Self-pay | Admitting: Nurse Practitioner

## 2021-09-21 ENCOUNTER — Other Ambulatory Visit: Payer: Self-pay | Admitting: Student

## 2021-09-21 DIAGNOSIS — G894 Chronic pain syndrome: Secondary | ICD-10-CM | POA: Diagnosis not present

## 2021-09-21 DIAGNOSIS — M48062 Spinal stenosis, lumbar region with neurogenic claudication: Secondary | ICD-10-CM | POA: Diagnosis not present

## 2021-09-21 DIAGNOSIS — M25571 Pain in right ankle and joints of right foot: Secondary | ICD-10-CM

## 2021-09-21 DIAGNOSIS — M5431 Sciatica, right side: Secondary | ICD-10-CM | POA: Diagnosis not present

## 2021-09-21 DIAGNOSIS — M545 Low back pain, unspecified: Secondary | ICD-10-CM | POA: Diagnosis not present

## 2021-09-21 DIAGNOSIS — Z79891 Long term (current) use of opiate analgesic: Secondary | ICD-10-CM | POA: Diagnosis not present

## 2021-09-21 DIAGNOSIS — M5136 Other intervertebral disc degeneration, lumbar region: Secondary | ICD-10-CM | POA: Diagnosis not present

## 2021-09-21 DIAGNOSIS — G8929 Other chronic pain: Secondary | ICD-10-CM

## 2021-09-21 DIAGNOSIS — M542 Cervicalgia: Secondary | ICD-10-CM | POA: Diagnosis not present

## 2021-09-21 DIAGNOSIS — M5432 Sciatica, left side: Secondary | ICD-10-CM | POA: Diagnosis not present

## 2021-09-25 ENCOUNTER — Other Ambulatory Visit: Payer: Self-pay | Admitting: Nurse Practitioner

## 2021-09-25 ENCOUNTER — Other Ambulatory Visit: Payer: Self-pay

## 2021-09-25 ENCOUNTER — Telehealth (HOSPITAL_COMMUNITY): Payer: 59 | Admitting: Psychiatry

## 2021-09-25 DIAGNOSIS — I739 Peripheral vascular disease, unspecified: Secondary | ICD-10-CM

## 2021-09-25 DIAGNOSIS — R7301 Impaired fasting glucose: Secondary | ICD-10-CM

## 2021-09-25 DIAGNOSIS — Z833 Family history of diabetes mellitus: Secondary | ICD-10-CM

## 2021-09-25 DIAGNOSIS — E782 Mixed hyperlipidemia: Secondary | ICD-10-CM

## 2021-09-25 DIAGNOSIS — Z6841 Body Mass Index (BMI) 40.0 and over, adult: Secondary | ICD-10-CM

## 2021-09-25 MED ORDER — SEMAGLUTIDE (2 MG/DOSE) 8 MG/3ML ~~LOC~~ SOPN
2.0000 mg | PEN_INJECTOR | SUBCUTANEOUS | 1 refills | Status: DC
  Filled 2021-09-25: qty 3, 28d supply, fill #0

## 2021-09-26 ENCOUNTER — Other Ambulatory Visit (HOSPITAL_COMMUNITY): Payer: Self-pay

## 2021-09-26 ENCOUNTER — Encounter: Payer: 59 | Admitting: Physical Medicine & Rehabilitation

## 2021-09-27 ENCOUNTER — Other Ambulatory Visit: Payer: Self-pay

## 2021-09-27 ENCOUNTER — Other Ambulatory Visit (HOSPITAL_COMMUNITY): Payer: Self-pay

## 2021-09-27 ENCOUNTER — Encounter (HOSPITAL_COMMUNITY): Payer: Self-pay | Admitting: Psychiatry

## 2021-09-27 ENCOUNTER — Telehealth (HOSPITAL_BASED_OUTPATIENT_CLINIC_OR_DEPARTMENT_OTHER): Payer: 59 | Admitting: Psychiatry

## 2021-09-27 DIAGNOSIS — F4310 Post-traumatic stress disorder, unspecified: Secondary | ICD-10-CM | POA: Diagnosis not present

## 2021-09-27 DIAGNOSIS — F331 Major depressive disorder, recurrent, moderate: Secondary | ICD-10-CM

## 2021-09-27 DIAGNOSIS — F419 Anxiety disorder, unspecified: Secondary | ICD-10-CM | POA: Diagnosis not present

## 2021-09-27 MED ORDER — OLANZAPINE-FLUOXETINE HCL 3-25 MG PO CAPS
1.0000 | ORAL_CAPSULE | Freq: Every evening | ORAL | 2 refills | Status: DC
  Filled 2021-09-27 – 2021-10-10 (×2): qty 30, 30d supply, fill #0
  Filled 2021-11-13: qty 30, 30d supply, fill #1

## 2021-09-27 MED ORDER — VORTIOXETINE HBR 10 MG PO TABS
10.0000 mg | ORAL_TABLET | Freq: Every day | ORAL | 2 refills | Status: DC
  Filled 2021-09-29: qty 30, 30d supply, fill #0
  Filled 2021-10-10 – 2021-11-04 (×2): qty 30, 30d supply, fill #1
  Filled 2021-12-03: qty 30, 30d supply, fill #2

## 2021-09-27 MED ORDER — ALPRAZOLAM 0.5 MG PO TABS
0.5000 mg | ORAL_TABLET | Freq: Two times a day (BID) | ORAL | 2 refills | Status: DC | PRN
  Filled 2021-09-27 – 2021-10-10 (×2): qty 60, 30d supply, fill #0
  Filled 2021-12-03: qty 60, 30d supply, fill #1
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
## 2021-09-28 ENCOUNTER — Ambulatory Visit (HOSPITAL_COMMUNITY): Payer: 59 | Admitting: Clinical

## 2021-09-28 NOTE — Progress Notes (Signed)
Cardiology Office Note   Date:  09/29/2021   ID:  Desiree Russell, DOB October 08, 1970, MRN [id number]  PCP:  Rip Harbour, NP  Cardiologist:   Minus Breeding, MD Referring:  Rip Harbour, NP  Chief Complaint  Patient presents with   Palpitations       History of Present Illness: Desiree Russell is a 51 y.o. female who is referred by Rip Harbour, NP for evaluation of pericardial effusion.  This was noted on an echocardiogram at [facility]  The patient has a complicated history and has been at [facility]  and [facility].   She was at [facility] .   She was hospitalized and treated at [facility] for pericarditis.  This was in 2019.  Most recently she was hospitalized in June 2022 at [facility] .   There was a questionable CVA.  MRI showed an acute early or subacute infarct in the right parietal lobe although it was not entirely clear that all her symptoms are related to this.  As part of this work-up I do see that she had an echocardiogram.  I was interested to see this as there was no evidence of the previously mentioned pericardial effusion.  There was an otherwise unremarkable echocardiogram.  She was in the emergency room in August with lightheadedness, difficulty with word finding.  However, there were no acute findings on MRI.     Since I last saw her in August she was in the hospital about a week later with chest pain.  I reviewed these records for this visit.     She had a CT negative for PE.  She was given NTG which caused headache and vomiting.  There was no mention of coronary calcium.     Since the August ER visit she is doing better.  She has anxiety and depression treated.  She is exercising routinely.  She has not had any symptoms consistent with her previous TIAs.  She is not having any new palpitations, presyncope or syncope.  She denies any chest pressure, neck or arm discomfort.  She had no weight gain or edema.   Past Medical History:  Diagnosis Date    Allergy    Anemia    Anxiety    Arthritis    Dyslipidemia    Fibromyalgia    GERD (gastroesophageal reflux disease)    Headache    Migraines    Pericarditis    Stroke ([place]) 12/2020   Tachycardia    TIA (transient ischemic attack)    Recurrent    Past Surgical History:  Procedure Laterality Date   ABDOMINAL HYSTERECTOMY     CHOLECYSTECTOMY     CHOLECYSTECTOMY, LAPAROSCOPIC     COLONOSCOPY     UPPER GASTROINTESTINAL ENDOSCOPY       Current Outpatient Medications  Medication Sig Dispense Refill   acetaminophen (TYLENOL) 325 MG tablet Take 650 mg by mouth every 6 (six) hours as needed for mild pain, fever or headache.     albuterol (VENTOLIN HFA) 108 (90 Base) MCG/ACT inhaler INHALE 2 PUFFS INTO THE LUNGS EVERY 4 HOURS AS NEEDED FOR WHEEZING. (Patient taking differently: Inhale 2 puffs into the lungs every 4 (four) hours as needed for wheezing.) 8.5 g 0   ALPRAZolam (XANAX) 0.5 MG tablet Take 1 tablet by mouth 2 times daily as needed for anxiety. 60 tablet 2   aspirin 81 MG EC tablet Take 1 tablet (81 mg total) by mouth daily. 21 tablet 0  Calcium-Vitamin D-Vitamin K 650-12.5-40 MG-MCG-MCG CHEW Chew 1 tablet by mouth daily.     cephALEXin (KEFLEX) 250 MG capsule Take 1 capsule by mouth once daily after relations as directed. 30 capsule 3   clopidogrel (PLAVIX) 75 MG tablet Take 1 tablet by mouth once daily 90 tablet 1   cyclobenzaprine (FLEXERIL) 10 MG tablet Take 5-10 mg by mouth at bedtime as needed for muscle spasms.     furosemide (LASIX) 20 MG tablet Take 1 tablet by mouth daily as needed. 90 tablet 3   gabapentin (NEURONTIN) 600 MG tablet Take 1 tablet  by mouth at bedtime. 90 tablet 1   Galcanezumab-gnlm (EMGALITY) 120 MG/ML SOAJ Inject 120mg  once monthly 3 mL 1   HYDROcodone-acetaminophen (NORCO) 10-325 MG tablet Take 1 tablet by mouth 3 times daily. 90 tablet 0   loratadine (CLARITIN) 10 MG tablet Take 10 mg by mouth daily.     metoprolol succinate (TOPROL-XL) 25 MG  24 hr tablet Take 1 tablet by mouth daily. 90 tablet 3   Multiple Vitamin (MULTIVITAMIN WITH MINERALS) TABS tablet Take 1 tablet by mouth daily.     OLANZapine-FLUoxetine (SYMBYAX) 3-25 MG capsule Take 1 capsule by mouth every evening. 30 capsule 2   ondansetron (ZOFRAN) 4 MG tablet Take 1 tablet by mouth every 8 hours as needed for nausea or vomiting. 30 tablet 3   pantoprazole (PROTONIX) 40 MG tablet Take 1 tablet by mouth daily. 90 tablet 1   Semaglutide, 2 MG/DOSE, 8 MG/3ML SOPN Inject 2 mg as directed once a week. 3 mL 1   senna-docusate (SENOKOT-S) 8.6-50 MG tablet Take 2 tablets by mouth daily.     Ubrogepant (UBRELVY) 100 MG TABS TAKE  1/2 TABLET AT HEADACHE ONSET. MAY REPEAT DOSE IN TWO HOURS IF NO IMPROVEMENT. DO NOT EXCEED MORE THAN TWO TABLETS IN 24 HOURS. (Patient taking differently: Take 50-100 mg by mouth See admin instructions. Take 50mg  at headache onset, may repeat dose in two hours if no improvement. Do not excess more than 200mg  in 24 hours) 18 tablet 1   vortioxetine HBr (TRINTELLIX) 10 MG TABS tablet Take 1 tablet by mouth daily. 30 tablet 2   azelastine (ASTELIN) 0.1 % nasal spray Place 1 spray into both nostrils 2 (two) times daily. Use in each nostril as directed (Patient not taking: Reported on 09/29/2021) 30 mL 12   HYDROcodone-acetaminophen (NORCO) 7.5-325 MG tablet Take 1 tablet by mouth every 8 hours (Patient not taking: Reported on 09/29/2021) 90 tablet 0   oxybutynin (DITROPAN-XL) 5 MG 24 hr tablet Take 1 (one) tablet by mouth at night for overactive bladder/urinary frequency.  Replaces tolterodine. (Patient not taking: Reported on 09/29/2021) 30 tablet 2   predniSONE (DELTASONE) 10 MG tablet Take 3 tablets by mouth for 3 days, Then take  2 tablets by mouth for 2 days, Then take 1 tablet by mouth for 1 day. (Patient not taking: Reported on 09/29/2021) 14 tablet 0   Semaglutide, 1 MG/DOSE, (OZEMPIC, 1 MG/DOSE,) 4 MG/3ML SOPN Inject 1 mg into the skin once a week. (Patient not  taking: Reported on 09/29/2021) 3 mL 1   No current facility-administered medications for this visit.    Allergies:   Codeine, Morphine, Morphine and related, and Chlorhexidine   ROS:  Please see the history of present illness.   Otherwise, review of systems are positive for none.   All other systems are reviewed and negative.    PHYSICAL EXAM: VS:  BP 116/84    Pulse  100    Ht 5\' 4"  (1.626 m)    Wt 221 lb 12.8 oz (100.6 kg)    SpO2 96%    BMI 38.07 kg/m  , BMI Body mass index is 38.07 kg/m. GENERAL:  Well appearing HEENT:  Pupils equal round and reactive, fundi not visualized, oral mucosa unremarkable NECK:  No jugular venous distention, waveform within normal limits, carotid upstroke brisk and symmetric, no bruits, no thyromegaly LYMPHATICS:  No cervical, inguinal adenopathy LUNGS:  Clear to auscultation bilaterally BACK:  No CVA tenderness CHEST:  Unremarkable HEART:  PMI not displaced or sustained,S1 and S2 within normal limits, no S3, no S4, no clicks, no rubs, no murmurs ABD:  Flat, positive bowel sounds normal in frequency in pitch, no bruits, no rebound, no guarding, no midline pulsatile mass, no hepatomegaly, no splenomegaly EXT:  2 plus pulses throughout, no edema, no cyanosis no clubbing SKIN:  No rashes no nodules NEURO:  Cranial nerves II through XII grossly intact, motor grossly intact throughout PSYCH:  Cognitively intact, oriented to person place and time   EKG:  EKG is not ordered today.    Recent Labs: 01/21/2021: Magnesium 1.8 03/29/2021: B Natriuretic Peptide 8.4 07/14/2021: Hemoglobin 12.4; Platelets 281; TSH 0.512 07/18/2021: ALT 12; BUN 13; Creatinine, Ser 1.06; Potassium 4.4; Sodium 140    Lipid Panel    Component Value Date/Time   CHOL 195 07/14/2021 0900   TRIG 137 07/14/2021 0900   HDL 52 07/14/2021 0900   CHOLHDL 3.8 07/14/2021 0900   CHOLHDL 4.4 01/21/2021 0156   VLDL 25 01/21/2021 0156   LDLCALC 119 (H) 07/14/2021 0900      Wt Readings  from Last 3 Encounters:  09/29/21 221 lb 12.8 oz (100.6 kg)  08/10/21 225 lb (102.1 kg)  07/14/21 233 lb 9.6 oz (106 kg)      Other studies Reviewed: Additional studies/ records that were reviewed today include: None Review of the above records demonstrates:  Please see elsewhere in the note.     ASSESSMENT AND PLAN:  PERICARDIAL EFFUSION:    This was not evident on the [facility] echo.  No change in therapy.  No further imaging.   PALPITATIONS:    She wore a two week monitor in October and had no arrhythmias although she felt chest pain.   She is no longer having chest pain or palpitations.  No further work-up.  DYSLIPIDEMIA: LDL was mildly elevated.  She has changed her diet.  She is exercising routinely.  No change in therapy.   Current medicines are reviewed at length with the patient today.  The patient does not have concerns regarding medicines.  The following changes have been made: None  Labs/ tests ordered today include: None  No orders of the defined types were placed in this encounter.    Disposition:   FU with me as needed   Signed, Minus Breeding, MD  09/29/2021 5:34 PM    [facility]

## 2021-09-29 ENCOUNTER — Encounter: Payer: Self-pay | Admitting: Cardiology

## 2021-09-29 ENCOUNTER — Ambulatory Visit (INDEPENDENT_AMBULATORY_CARE_PROVIDER_SITE_OTHER): Payer: 59 | Admitting: Cardiology

## 2021-09-29 ENCOUNTER — Other Ambulatory Visit (HOSPITAL_COMMUNITY): Payer: Self-pay

## 2021-09-29 ENCOUNTER — Other Ambulatory Visit: Payer: Self-pay

## 2021-09-29 VITALS — BP 116/84 | HR 100 | Ht 64.0 in | Wt 221.8 lb

## 2021-09-29 DIAGNOSIS — E785 Hyperlipidemia, unspecified: Secondary | ICD-10-CM | POA: Diagnosis not present

## 2021-09-29 DIAGNOSIS — I3139 Other pericardial effusion (noninflammatory): Secondary | ICD-10-CM

## 2021-09-29 DIAGNOSIS — R002 Palpitations: Secondary | ICD-10-CM

## 2021-09-29 NOTE — Patient Instructions (Signed)
Medication Instructions:  °Your physician recommends that you continue on your current medications as directed. Please refer to the Current Medication list given to you today.  °*If you need a refill on your cardiac medications before your next appointment, please call your pharmacy* ° ° °Lab Work: °None °If you have labs (blood work) drawn today and your tests are completely normal, you will receive your results only by: °MyChart Message (if you have MyChart) OR °A paper copy in the mail °If you have any lab test that is abnormal or we need to change your treatment, we will call you to review the results. ° ° °Testing/Procedures: °None ° ° °Follow-Up: °At [facility], you and your health needs are our priority.  As part of our continuing mission to provide you with exceptional heart care, we have created designated Provider Care Teams.  These Care Teams include your primary Cardiologist (physician) and Advanced Practice Providers (APPs -  Physician Assistants and Nurse Practitioners) who all work together to provide you with the care you need, when you need it. ° °We recommend signing up for the patient portal called "MyChart".  Sign up information is provided on this After Visit Summary.  MyChart is used to connect with patients for Virtual Visits (Telemedicine).  Patients are able to view lab/test results, encounter notes, upcoming appointments, etc.  Non-urgent messages can be sent to your provider as well.   °To learn more about what you can do with MyChart, go to [email or web address].   ° °Your next appointment:   °As needed ° °The format for your next appointment:   °In Person ° °Provider:   °James Hochrein, MD   ° ° °Other Instructions °  °

## 2021-10-03 ENCOUNTER — Other Ambulatory Visit: Payer: Self-pay

## 2021-10-03 ENCOUNTER — Ambulatory Visit (HOSPITAL_COMMUNITY)
Admission: RE | Admit: 2021-10-03 | Discharge: 2021-10-03 | Disposition: A | Discharge status: Home / Self Care | Payer: 59 | Source: Ambulatory Visit | Attending: Student | Admitting: Student

## 2021-10-03 DIAGNOSIS — S99911A Unspecified injury of right ankle, initial encounter: Secondary | ICD-10-CM | POA: Diagnosis not present

## 2021-10-03 DIAGNOSIS — M25571 Pain in right ankle and joints of right foot: Secondary | ICD-10-CM | POA: Diagnosis not present

## 2021-10-03 IMAGING — MR MR ANKLE*R* W/O CM
5 of 6 series · 32 of 40 positions shown · non-contrast
Comparison: X-ray ankle [DATE]; X-ray foot [DATE].

CLINICAL DATA: Right ankle pain related to an injury falling
downstairs last year, and notes re-injured while jogging 3 weeks
ago.

EXAM:
MRI OF THE RIGHT ANKLE WITHOUT CONTRAST
TECHNIQUE: Multiplanar, multisequence MR imaging of the ankle was performed. No
intravenous contrast was administered.

[Series 2: T2 fat-sat · axial · right · 4.0mm · 0.42mm/px · z∈[-156,+8]mm · 8 of 34 slices shown (1 of 2)]
[im 1/34]
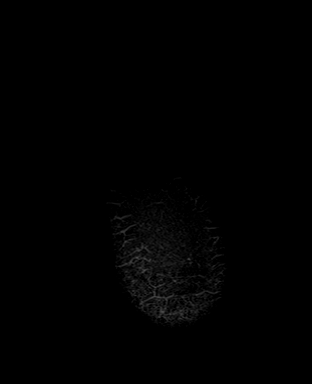
[im 5/34]
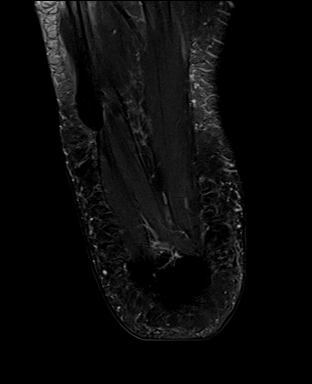
[im 10/34]
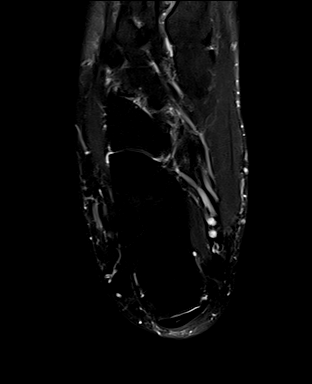
[im 15/34]
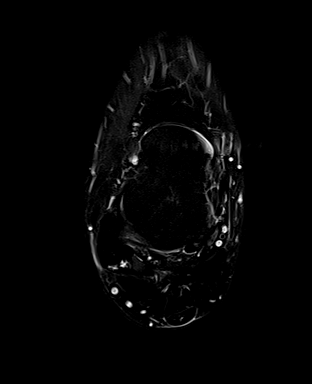
[im 19/34]
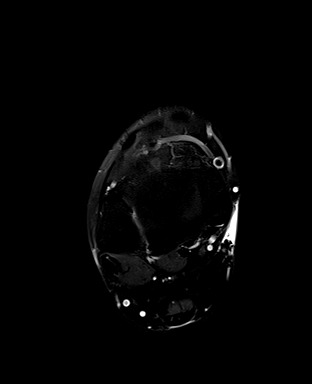
[im 24/34]
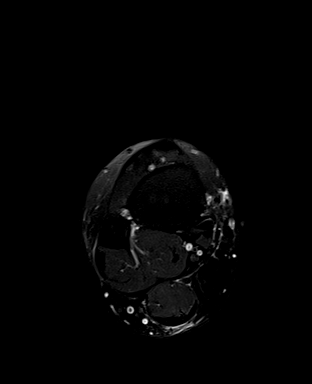
[im 29/34]
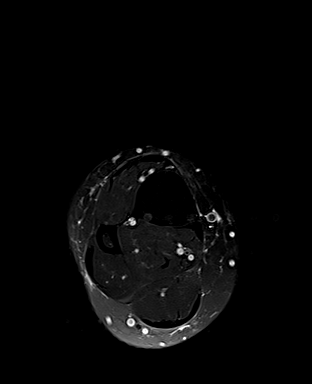
[im 34/34]
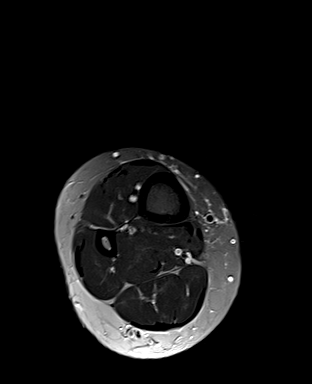

[Series 3: PD fat-sat · axial · right · 4.0mm · 0.50mm/px · z∈[-156,+8]mm · 8 of 34 slices shown (1 of 2)]
[im 1/34]
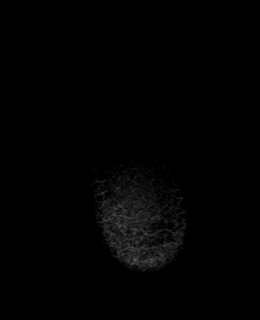
[im 5/34]
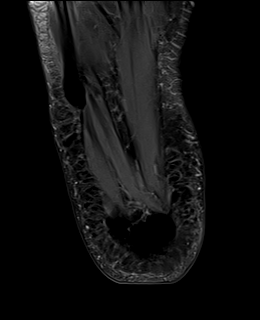
[im 10/34]
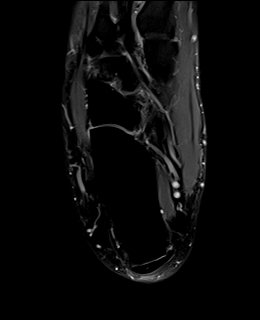
[im 15/34]
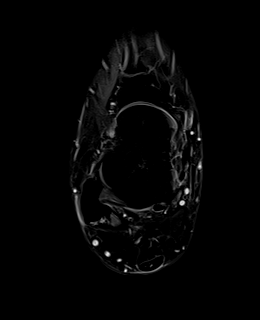
[im 19/34]
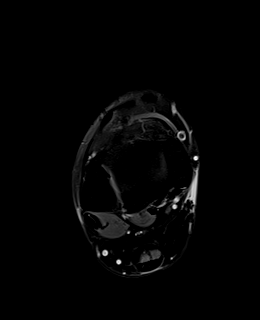
[im 24/34]
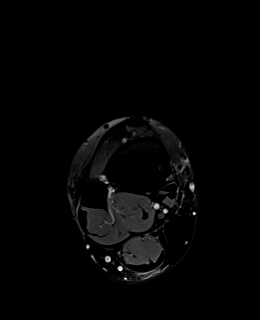
[im 29/34]
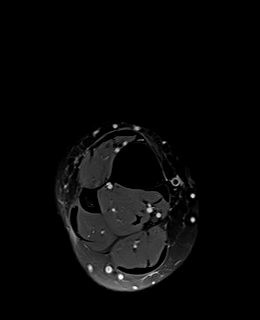
[im 34/34]
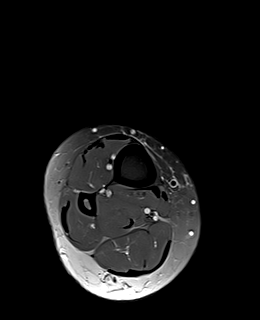

[Series 4: T2 fat-sat · coronal · right · 3.0mm · 0.50mm/px · 7 of 34 slices shown (2 of 2)]
[im 1/34]
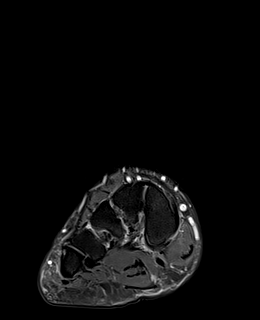
[im 6/34]
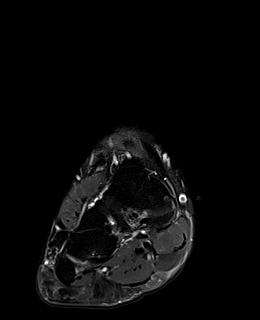
[im 12/34]
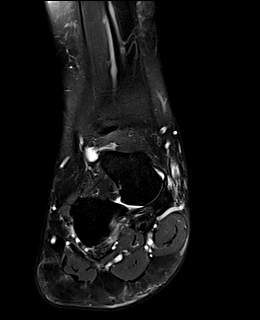
[im 17/34]
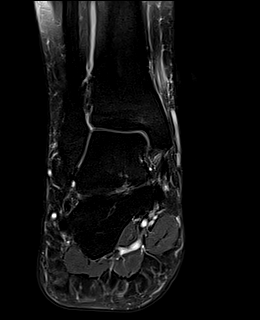
[im 23/34]
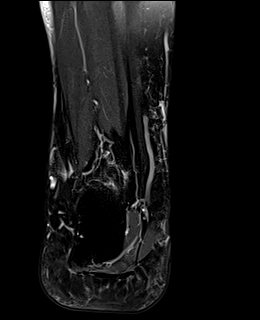
[im 28/34]
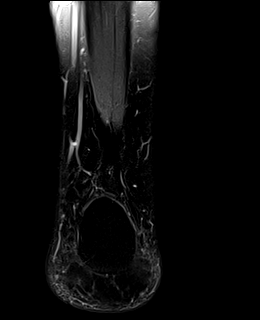
[im 34/34]
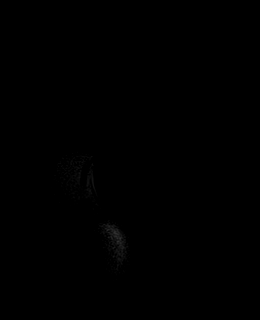

[Series 5: PD fat-sat · axial · right · 4.0mm · 0.31mm/px · z∈[-154,+11]mm · 7 of 34 slices shown (2 of 2)]
[im 1/34]
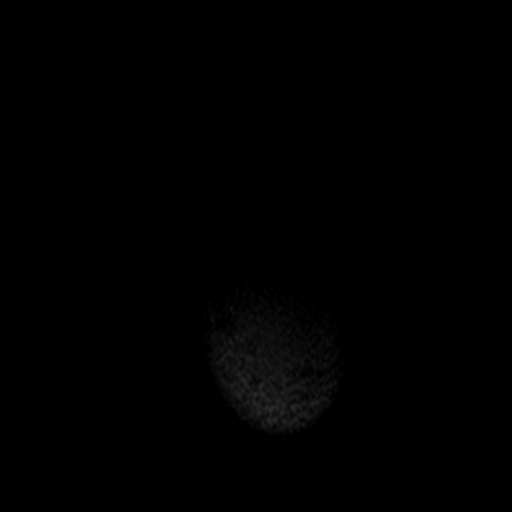
[im 6/34]
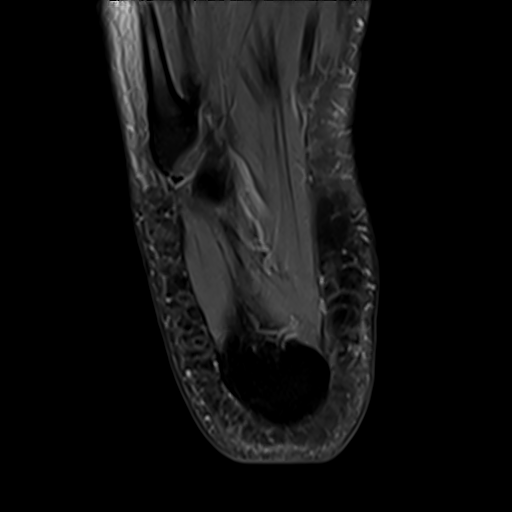
[im 12/34]
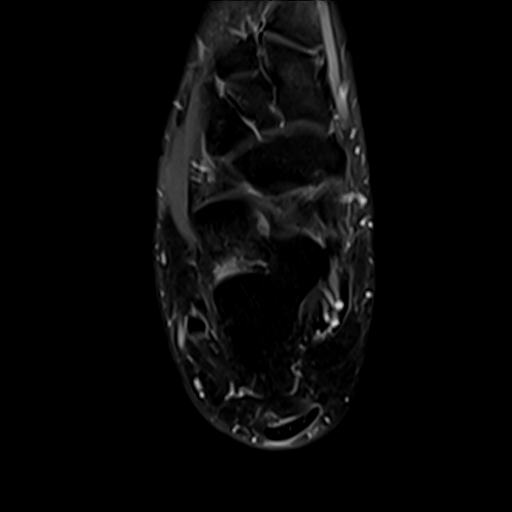
[im 17/34]
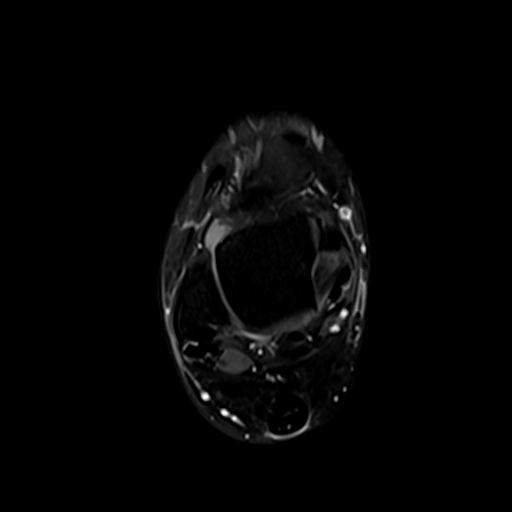
[im 23/34]
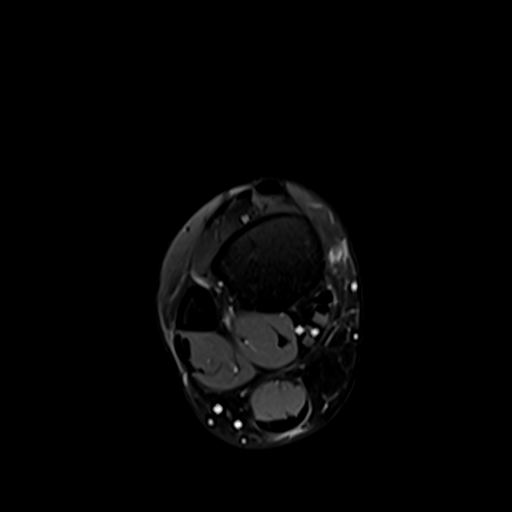
[im 28/34]
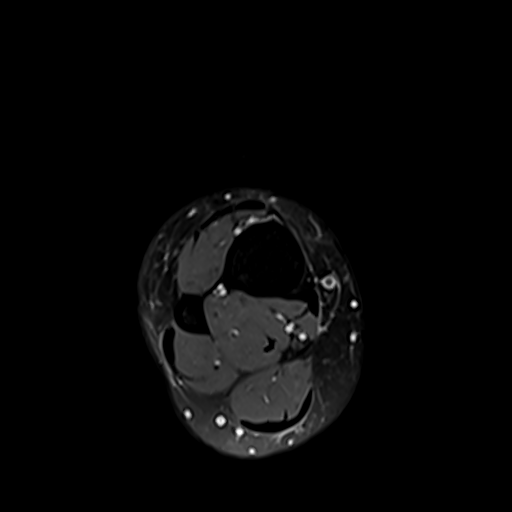
[im 34/34]
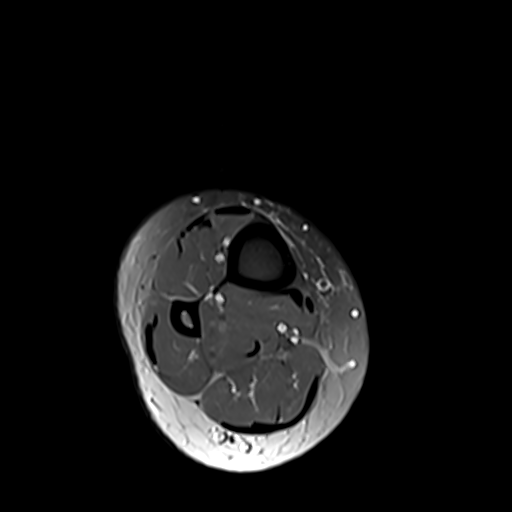

[Series 6: T1 · sagittal · right · 3.0mm · 0.31mm/px · 2 of 25 slices shown]
[im 1/25]
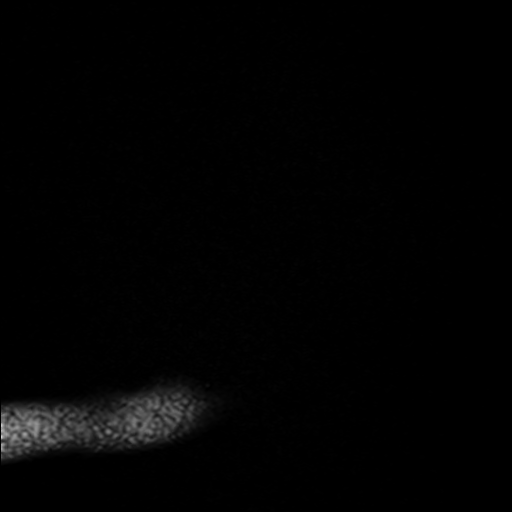
[im 7/25]
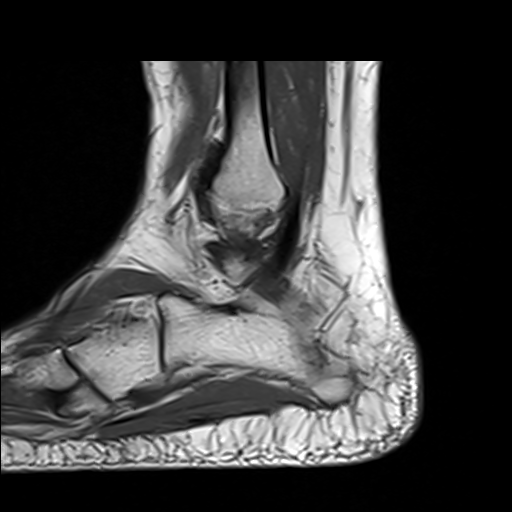

[32 of 40 positions shown; findings below may reference images not displayed]

FINDINGS: TENDONS

Peroneal: Peroneal longus tendon intact. Peroneal brevis intact.

Posteromedial: Posterior tibial tendon intact. Flexor hallucis
longus tendon intact. Flexor digitorum longus tendon intact.

Anterior: Tibialis anterior tendon intact. Extensor hallucis longus
tendon intact Extensor digitorum longus tendon intact.

Achilles:  Intact.

Plantar Fascia: Intact.

LIGAMENTS

Lateral: Anterior talofibular ligament intact. Calcaneofibular
ligament intact. Posterior talofibular ligament intact. Anterior and
posterior tibiofibular ligaments intact.

Medial: Deltoid ligament intact. Spring ligament intact.

CARTILAGE

Ankle Joint: No joint effusion. Normal ankle mortise. No chondral
defect.

Subtalar Joints/Sinus Tarsi: Normal subtalar joints. No subtalar
joint effusion. Normal sinus tarsi.

Bones: No marrow signal abnormality.  No fracture or dislocation.

Soft Tissue: No fluid collection or hematoma. Muscles are normal
without edema or atrophy. Tarsal tunnel is normal.
IMPRESSION: 1.  No evidence of ligamentous or tendon injury.

2. Normal marrow signal without evidence of fracture or acute
osseous abnormality. No appreciable joint effusion.

## 2021-10-04 ENCOUNTER — Encounter: Payer: Self-pay | Admitting: Physical Therapy

## 2021-10-04 NOTE — Therapy (Signed)
[facility] ?[place], [place], [address] ?Phone: [phone]   Fax:  [phone] ? ?Patient Details  ?Name: Desiree Russell ?MRN: [id number] ?Date of Birth: 04/10/1971 ?Referring Provider:  No ref. provider found ? ?Encounter Date: 10/04/2021 ? ?PHYSICAL THERAPY DISCHARGE SUMMARY ? ?Visits from Start of Care: 3 ? ?Current functional level related to goals / functional outcomes: ?Did not return since last scheduled session, DC per clinic policy  ?  ?Remaining deficits: ?Unable to assess  ?  ?Education / Equipment: ?Unable to assess   ? ?Patient agrees to discharge. Patient goals were not met. Patient is being discharged due to not returning since the last visit. ? ? ?Reyansh Kushnir U PT, DPT, PN2  ? ?Supplemental Physical Therapist ?[facility]  ? ? ? ? ? ?[facility] ?[place], [place], [address] ?Phone: [phone]   Fax:  [phone] ?

## 2021-10-09 DIAGNOSIS — M25371 Other instability, right ankle: Secondary | ICD-10-CM | POA: Diagnosis not present

## 2021-10-10 ENCOUNTER — Other Ambulatory Visit (HOSPITAL_COMMUNITY): Payer: Self-pay

## 2021-10-10 ENCOUNTER — Other Ambulatory Visit: Payer: Self-pay | Admitting: Nurse Practitioner

## 2021-10-10 DIAGNOSIS — G619 Inflammatory polyneuropathy, unspecified: Secondary | ICD-10-CM

## 2021-10-10 MED ORDER — GABAPENTIN 600 MG PO TABS
600.0000 mg | ORAL_TABLET | Freq: Every day | ORAL | 1 refills | Status: DC
  Filled 2021-10-10: qty 90, 90d supply, fill #0
  Filled 2022-01-09: qty 90, 90d supply, fill #1

## 2021-10-11 ENCOUNTER — Other Ambulatory Visit (HOSPITAL_COMMUNITY): Payer: Self-pay

## 2021-10-12 ENCOUNTER — Other Ambulatory Visit (HOSPITAL_COMMUNITY): Payer: Self-pay

## 2021-10-12 NOTE — Progress Notes (Signed)
? ?Subjective:  ?Patient ID: Desiree Russell, female    DOB: Dec 05, 1970  Age: 51 y.o. MRN: [id number] ? ?CC ?Hyperlipidemia ?GERD ? ? ?HPI ? Desiree Russell is a 51 year old African-American female that presents for follow-up of hyperlipidemia, GERD, and weight management. She recently had a right ankle fracture after exercising. She was placed in a walking boot which has been discontinued. She is being followed by Mechele Claude, PA. Denies current pain or discomfort. Desiree Russell reports recent  sleep study test was negative for sleep apnea.  ? ?Lipid/Cholesterol, Follow-up ? ?Last lipid panel Other pertinent labs  ?Lab Results  ?Component Value Date  ? CHOL 195 07/14/2021  ? HDL 52 07/14/2021  ? LDLCALC 119 (H) 07/14/2021  ? TRIG 137 07/14/2021  ? CHOLHDL 3.8 07/14/2021  ? Lab Results  ?Component Value Date  ? ALT 12 07/18/2021  ? AST 20 07/18/2021  ? PLT 281 07/14/2021  ? TSH 0.512 07/14/2021  ?  ? ?She was last seen for this 3 months ago.  ?Management includes Lipitor 40 mg daily. ? ?She reports excellent compliance with treatment. ?She is not having side effects.  ? ?Symptoms: ?No chest pain No chest pressure/discomfort  ?No dyspnea No lower extremity edema  ?No numbness or tingling of extremity No orthopnea  ?No palpitations No paroxysmal nocturnal dyspnea  ?No speech difficulty No syncope  ? ?Current diet: in general, a "healthy" diet   ?Current exercise: aerobics, running/ jogging, and walking ? ?GERD, Follow up: ? ?The patient was last seen for GERD 3 months ago. ?Changes management includes Protonix 40 mg daily. ?She reports excellent compliance with treatment. ?She is not having side effects. ?She is NOT experiencing belching and eructation, difficulty swallowing, or heartburn ?  ?Weight management, follow-up: ?Desiree Russell reports she wants to lose weight to improve overall health. She has modified diet and is exercising regularly. She has lost 10 pounds since last visit 63-month ago. Current weight 223 lbs, BMI 38.28. States she  is recovering well from right ankle fracture she sustained while exercising on treadmill. Current medical management includes Semaglutide 2 mg injection weekly. Denies nausea, GI upset, or constipation.  ? ? ?Current Outpatient Medications on File Prior to Visit  ?Medication Sig Dispense Refill  ? acetaminophen (TYLENOL) 325 MG tablet Take 650 mg by mouth every 6 (six) hours as needed for mild pain, fever or headache.    ? albuterol (VENTOLIN HFA) 108 (90 Base) MCG/ACT inhaler INHALE 2 PUFFS INTO THE LUNGS EVERY 4 HOURS AS NEEDED FOR WHEEZING. (Patient taking differently: Inhale 2 puffs into the lungs every 4 (four) hours as needed for wheezing.) 8.5 g 0  ? ALPRAZolam (XANAX) 0.5 MG tablet Take 1 tablet by mouth 2 times daily as needed for anxiety. 60 tablet 2  ? aspirin 81 MG EC tablet Take 1 tablet (81 mg total) by mouth daily. 21 tablet 0  ? azelastine (ASTELIN) 0.1 % nasal spray Place 1 spray into both nostrils 2 (two) times daily. Use in each nostril as directed (Patient not taking: Reported on 09/29/2021) 30 mL 12  ? Calcium-Vitamin D-Vitamin K 650-12.5-40 MG-MCG-MCG CHEW Chew 1 tablet by mouth daily.    ? cephALEXin (KEFLEX) 250 MG capsule Take 1 capsule by mouth once daily after relations as directed. 30 capsule 3  ? clopidogrel (PLAVIX) 75 MG tablet Take 1 tablet by mouth once daily 90 tablet 1  ? cyclobenzaprine (FLEXERIL) 10 MG tablet Take 5-10 mg by mouth at bedtime as needed for muscle spasms.    ?  furosemide (LASIX) 20 MG tablet Take 1 tablet by mouth daily as needed. 90 tablet 3  ? gabapentin (NEURONTIN) 600 MG tablet Take 1 tablet  by mouth at bedtime. 90 tablet 1  ? Galcanezumab-gnlm (EMGALITY) 120 MG/ML SOAJ Inject '120mg'$  once monthly 3 mL 1  ? HYDROcodone-acetaminophen (NORCO) 10-325 MG tablet Take 1 tablet by mouth 3 times daily. 90 tablet 0  ? HYDROcodone-acetaminophen (NORCO) 7.5-325 MG tablet Take 1 tablet by mouth every 8 hours (Patient not taking: Reported on 09/29/2021) 90 tablet 0  ? loratadine  (CLARITIN) 10 MG tablet Take 10 mg by mouth daily.    ? metoprolol succinate (TOPROL-XL) 25 MG 24 hr tablet Take 1 tablet by mouth daily. 90 tablet 3  ? Multiple Vitamin (MULTIVITAMIN WITH MINERALS) TABS tablet Take 1 tablet by mouth daily.    ? OLANZapine-FLUoxetine (SYMBYAX) 3-25 MG capsule Take 1 capsule by mouth every evening. 30 capsule 2  ? ondansetron (ZOFRAN) 4 MG tablet Take 1 tablet by mouth every 8 hours as needed for nausea or vomiting. 30 tablet 3  ? oxybutynin (DITROPAN-XL) 5 MG 24 hr tablet Take 1 (one) tablet by mouth at night for overactive bladder/urinary frequency.  Replaces tolterodine. (Patient not taking: Reported on 09/29/2021) 30 tablet 2  ? pantoprazole (PROTONIX) 40 MG tablet Take 1 tablet by mouth daily. 90 tablet 1  ? predniSONE (DELTASONE) 10 MG tablet Take 3 tablets by mouth for 3 days, Then take  2 tablets by mouth for 2 days, Then take 1 tablet by mouth for 1 day. (Patient not taking: Reported on 09/29/2021) 14 tablet 0  ? Semaglutide, 1 MG/DOSE, (OZEMPIC, 1 MG/DOSE,) 4 MG/3ML SOPN Inject 1 mg into the skin once a week. (Patient not taking: Reported on 09/29/2021) 3 mL 1  ? Semaglutide, 2 MG/DOSE, 8 MG/3ML SOPN Inject 2 mg as directed once a week. 3 mL 1  ? senna-docusate (SENOKOT-S) 8.6-50 MG tablet Take 2 tablets by mouth daily.    ? Ubrogepant (UBRELVY) 100 MG TABS TAKE  1/2 TABLET AT HEADACHE ONSET. MAY REPEAT DOSE IN TWO HOURS IF NO IMPROVEMENT. DO NOT EXCEED MORE THAN TWO TABLETS IN 24 HOURS. (Patient taking differently: Take 50-100 mg by mouth See admin instructions. Take '50mg'$  at headache onset, may repeat dose in two hours if no improvement. Do not excess more than '200mg'$  in 24 hours) 18 tablet 1  ? vortioxetine HBr (TRINTELLIX) 10 MG TABS tablet Take 1 tablet by mouth daily. 30 tablet 2  ? ?No current facility-administered medications on file prior to visit.  ? ?Past Medical History:  ?Diagnosis Date  ? Allergy   ? Anemia   ? Anxiety   ? Arthritis   ? Dyslipidemia   ? Fibromyalgia    ? GERD (gastroesophageal reflux disease)   ? Headache   ? Migraines   ? Pericarditis   ? Stroke [facility]) 12/2020  ? Tachycardia   ? TIA (transient ischemic attack)   ? Recurrent  ? ?Past Surgical History:  ?Procedure Laterality Date  ? ABDOMINAL HYSTERECTOMY    ? CHOLECYSTECTOMY    ? CHOLECYSTECTOMY, LAPAROSCOPIC    ? COLONOSCOPY    ? UPPER GASTROINTESTINAL ENDOSCOPY    ?  ?Family History  ?Problem Relation Age of Onset  ? Hypertension Mother   ? Hypertension Father   ? Heart attack Father 66  ? Healthy Sister   ? Healthy Sister   ? Healthy Sister   ? Hypertension Brother   ? Diabetes type II Brother   ?  COPD Brother   ? Healthy Brother   ? Healthy Brother   ? Breast cancer Maternal Aunt   ? Throat cancer Maternal Uncle   ? Hypertension Maternal Grandmother   ? Diabetes Paternal Grandmother   ? Healthy Daughter   ? Healthy Son   ? Healthy Son   ? Healthy Son   ? Colon cancer Neg Hx   ? Pancreatic cancer Neg Hx   ? Stomach cancer Neg Hx   ? Liver disease Neg Hx   ? Esophageal cancer Neg Hx   ? Rectal cancer Neg Hx   ? ?Social History  ? ?Socioeconomic History  ? Marital status: Married  ?  Spouse name: Not on file  ? Number of children: 4  ? Years of education: Not on file  ? Highest education level: Not on file  ?Occupational History  ? Not on file  ?Tobacco Use  ? Smoking status: Never  ? Smokeless tobacco: Never  ?Vaping Use  ? Vaping Use: Never used  ?Substance and Sexual Activity  ? Alcohol use: Yes  ?  Comment: occ   ? Drug use: No  ? Sexual activity: Not on file  ?Other Topics Concern  ? Not on file  ?Social History Narrative  ? 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    ? ?Social Determinants of Health  ? ?Financial Resource Strain: Not on file  ?Food Insecurity: Not on file  ?Transportation Needs: Not on file  ?Physical Activity: Not on file  ?Stress: Not on file  ?Social Connections: Not on file  ? ? ?Review of Systems  ?Constitutional:  Negative for appetite change, chills, fatigue and  fever.  ?HENT:  Negative for congestion, ear discharge, ear pain, rhinorrhea, sinus pressure, sneezing and sore throat.   ?Eyes:  Negative for visual disturbance.  ?Respiratory:  Negative for cough, ches

## 2021-10-13 ENCOUNTER — Other Ambulatory Visit (HOSPITAL_COMMUNITY): Payer: Self-pay

## 2021-10-13 ENCOUNTER — Encounter: Payer: Self-pay | Admitting: Nurse Practitioner

## 2021-10-13 ENCOUNTER — Ambulatory Visit: Payer: 59 | Admitting: Nurse Practitioner

## 2021-10-13 ENCOUNTER — Other Ambulatory Visit: Payer: Self-pay

## 2021-10-13 VITALS — BP 130/88 | HR 88 | Ht 64.0 in | Wt 223.0 lb

## 2021-10-13 DIAGNOSIS — E782 Mixed hyperlipidemia: Secondary | ICD-10-CM | POA: Diagnosis not present

## 2021-10-13 DIAGNOSIS — Z6838 Body mass index (BMI) 38.0-38.9, adult: Secondary | ICD-10-CM | POA: Diagnosis not present

## 2021-10-13 DIAGNOSIS — K219 Gastro-esophageal reflux disease without esophagitis: Secondary | ICD-10-CM

## 2021-10-13 MED ORDER — WEGOVY 2.4 MG/0.75ML ~~LOC~~ SOAJ
2.4000 mg | SUBCUTANEOUS | 3 refills | Status: DC
  Filled 2021-10-13: qty 3, 28d supply, fill #0
  Filled 2021-10-30: qty 3, 30d supply, fill #0
  Filled 2021-11-07: qty 3, 28d supply, fill #0
  Filled 2021-12-03: qty 3, 28d supply, fill #1
  Filled 2022-01-01: qty 3, 28d supply, fill #2
  Filled 2022-01-26: qty 3, 28d supply, fill #3

## 2021-10-13 NOTE — Patient Instructions (Signed)
Continue medications ?Begin Wegovy 2.4 mg weekly ?Follow-up in 25-monthweight management  ? ? ?Health Maintenance, Female ?Adopting a healthy lifestyle and getting preventive care are important in promoting health and wellness. Ask your health care provider about: ?The right schedule for you to have regular tests and exams. ?Things you can do on your own to prevent diseases and keep yourself healthy. ?What should I know about diet, weight, and exercise? ?Eat a healthy diet ? ?Eat a diet that includes plenty of vegetables, fruits, low-fat dairy products, and lean protein. ?Do not eat a lot of foods that are high in solid fats, added sugars, or sodium. ?Maintain a healthy weight ?Body mass index (BMI) is used to identify weight problems. It estimates body fat based on height and weight. Your health care provider can help determine your BMI and help you achieve or maintain a healthy weight. ?Get regular exercise ?Get regular exercise. This is one of the most important things you can do for your health. Most adults should: ?Exercise for at least 150 minutes each week. The exercise should increase your heart rate and make you sweat (moderate-intensity exercise). ?Do strengthening exercises at least twice a week. This is in addition to the moderate-intensity exercise. ?Spend less time sitting. Even light physical activity can be beneficial. ?Watch cholesterol and blood lipids ?Have your blood tested for lipids and cholesterol at 49years of age, then have this test every 5 years. ?Have your cholesterol levels checked more often if: ?Your lipid or cholesterol levels are high. ?You are older than 49years of age. ?You are at high risk for heart disease. ?What should I know about cancer screening? ?Depending on your health history and family history, you may need to have cancer screening at various ages. This may include screening for: ?Breast cancer. ?Cervical cancer. ?Colorectal cancer. ?Skin cancer. ?Lung cancer. ?What  should I know about heart disease, diabetes, and high blood pressure? ?Blood pressure and heart disease ?High blood pressure causes heart disease and increases the risk of stroke. This is more likely to develop in people who have high blood pressure readings or are overweight. ?Have your blood pressure checked: ?Every 3-5 years if you are 19341years of age. ?Every year if you are 42years old or older. ?Diabetes ?Have regular diabetes screenings. This checks your fasting blood sugar level. Have the screening done: ?Once every three years after age 5362if you are at a normal weight and have a low risk for diabetes. ?More often and at a younger age if you are overweight or have a high risk for diabetes. ?What should I know about preventing infection? ?Hepatitis B ?If you have a higher risk for hepatitis B, you should be screened for this virus. Talk with your health care provider to find out if you are at risk for hepatitis B infection. ?Hepatitis C ?Testing is recommended for: ?Everyone born from [birth year]through 1963. ?Anyone with known risk factors for hepatitis C. ?Sexually transmitted infections (STIs) ?Get screened for STIs, including gonorrhea and chlamydia, if: ?You are sexually active and are younger than 49years of age. ?You are older than 49years of age and your health care provider tells you that you are at risk for this type of infection. ?Your sexual activity has changed since you were last screened, and you are at increased risk for chlamydia or gonorrhea. Ask your health care provider if you are at risk. ?Ask your health care provider about whether you are at high risk  for HIV. Your health care provider may recommend a prescription medicine to help prevent HIV infection. If you choose to take medicine to prevent HIV, you should first get tested for HIV. You should then be tested every 3 months for as long as you are taking the medicine. ?Pregnancy ?If you are about to stop having your period  (premenopausal) and you may become pregnant, seek counseling before you get pregnant. ?Take 400 to 800 micrograms (mcg) of folic acid every day if you become pregnant. ?Ask for birth control (contraception) if you want to prevent pregnancy. ?Osteoporosis and menopause ?Osteoporosis is a disease in which the bones lose minerals and strength with aging. This can result in bone fractures. If you are 91 years old or older, or if you are at risk for osteoporosis and fractures, ask your health care provider if you should: ?Be screened for bone loss. ?Take a calcium or vitamin D supplement to lower your risk of fractures. ?Be given hormone replacement therapy (HRT) to treat symptoms of menopause. ?Follow these instructions at home: ?Alcohol use ?Do not drink alcohol if: ?Your health care provider tells you not to drink. ?You are pregnant, may be pregnant, or are planning to become pregnant. ?If you drink alcohol: ?Limit how much you have to: ?0-1 drink a day. ?Know how much alcohol is in your drink. In the [place] one drink equals one 12 oz bottle of beer (355 mL), one 5 oz glass of wine (148 mL), or one 1? oz glass of hard liquor (44 mL). ?Lifestyle ?Do not use any products that contain nicotine or tobacco. These products include cigarettes, chewing tobacco, and vaping devices, such as e-cigarettes. If you need help quitting, ask your health care provider. ?Do not use street drugs. ?Do not share needles. ?Ask your health care provider for help if you need support or information about quitting drugs. ?General instructions ?Schedule regular health, dental, and eye exams. ?Stay current with your vaccines. ?Tell your health care provider if: ?You often feel depressed. ?You have ever been abused or do not feel safe at home. ?Summary ?Adopting a healthy lifestyle and getting preventive care are important in promoting health and wellness. ?Follow your health care provider's instructions about healthy diet, exercising, and getting  tested or screened for diseases. ?Follow your health care provider's instructions on monitoring your cholesterol and blood pressure. ?This information is not intended to replace advice given to you by your health care provider. Make sure you discuss any questions you have with your health care provider. ?Document Revised: 12/05/2020 Document Reviewed: 12/05/2020 ?Elsevier Patient Education ? [place]. ? ?

## 2021-10-14 LAB — COMPREHENSIVE METABOLIC PANEL
ALT: 11 IU/L (ref 0–32)
AST: 12 IU/L (ref 0–40)
Albumin/Globulin Ratio: 1.9 (ref 1.2–2.2)
Albumin: 4.6 g/dL (ref 3.8–4.8)
Alkaline Phosphatase: 54 IU/L (ref 44–121)
BUN/Creatinine Ratio: 11 (ref 9–23)
BUN: 11 mg/dL (ref 6–24)
Bilirubin Total: <0.2 mg/dL (ref 0.0–1.2)
CO2: 23 mmol/L (ref 20–29)
Calcium: 9.8 mg/dL (ref 8.7–10.2)
Chloride: 102 mmol/L (ref 96–106)
Creatinine, Ser: 1.01 mg/dL — ABNORMAL HIGH (ref 0.57–1.00)
Globulin, Total: 2.4 g/dL (ref 1.5–4.5)
Glucose: 79 mg/dL (ref 70–99)
Potassium: 4 mmol/L (ref 3.5–5.2)
Sodium: 138 mmol/L (ref 134–144)
Total Protein: 7 g/dL (ref 6.0–8.5)
eGFR: 69 mL/min/{1.73_m2} (ref >59)

## 2021-10-14 LAB — CBC WITH DIFFERENTIAL/PLATELET
Basophils Absolute: 0 10*3/uL (ref 0.0–0.2)
Basos: 0 %
EOS (ABSOLUTE): 0 10*3/uL (ref 0.0–0.4)
Eos: 0 %
Hematocrit: 39 % (ref 34.0–46.6)
Hemoglobin: 12.8 g/dL (ref 11.1–15.9)
Immature Grans (Abs): 0 10*3/uL (ref 0.0–0.1)
Immature Granulocytes: 0 %
Lymphocytes Absolute: 2.6 10*3/uL (ref 0.7–3.1)
Lymphs: 29 %
MCH: 29 pg (ref 26.6–33.0)
MCHC: 32.8 g/dL (ref 31.5–35.7)
MCV: 88 fL (ref 79–97)
Monocytes Absolute: 0.6 10*3/uL (ref 0.1–0.9)
Monocytes: 7 %
Neutrophils Absolute: 5.7 10*3/uL (ref 1.4–7.0)
Neutrophils: 64 %
Platelets: 250 10*3/uL (ref 150–450)
RBC: 4.42 x10E6/uL (ref 3.77–5.28)
RDW: 13.3 % (ref 11.7–15.4)
WBC: 8.9 10*3/uL (ref 3.4–10.8)

## 2021-10-14 LAB — LIPID PANEL
Chol/HDL Ratio: 3.7 ratio (ref 0.0–4.4)
Cholesterol, Total: 211 mg/dL — ABNORMAL HIGH (ref 100–199)
HDL: 57 mg/dL (ref >39)
LDL Chol Calc (NIH): 137 mg/dL — ABNORMAL HIGH (ref 0–99)
Triglycerides: 93 mg/dL (ref 0–149)
VLDL Cholesterol Cal: 17 mg/dL (ref 5–40)

## 2021-10-14 LAB — CARDIOVASCULAR RISK ASSESSMENT

## 2021-10-16 ENCOUNTER — Other Ambulatory Visit (HOSPITAL_COMMUNITY): Payer: Self-pay

## 2021-10-18 DIAGNOSIS — Z760 Encounter for issue of repeat prescription: Secondary | ICD-10-CM | POA: Diagnosis not present

## 2021-10-19 ENCOUNTER — Other Ambulatory Visit: Payer: Self-pay

## 2021-10-19 ENCOUNTER — Ambulatory Visit (INDEPENDENT_AMBULATORY_CARE_PROVIDER_SITE_OTHER): Payer: 59 | Admitting: Clinical

## 2021-10-19 DIAGNOSIS — F4310 Post-traumatic stress disorder, unspecified: Secondary | ICD-10-CM | POA: Diagnosis not present

## 2021-10-19 DIAGNOSIS — F419 Anxiety disorder, unspecified: Secondary | ICD-10-CM
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
## 2021-10-20 ENCOUNTER — Other Ambulatory Visit (HOSPITAL_COMMUNITY): Payer: Self-pay

## 2021-10-20 ENCOUNTER — Encounter: Payer: Self-pay | Admitting: Nurse Practitioner

## 2021-10-28 ENCOUNTER — Other Ambulatory Visit (HOSPITAL_COMMUNITY): Payer: Self-pay

## 2021-10-30 ENCOUNTER — Other Ambulatory Visit (HOSPITAL_COMMUNITY): Payer: Self-pay

## 2021-11-02 ENCOUNTER — Other Ambulatory Visit (HOSPITAL_COMMUNITY): Payer: Self-pay

## 2021-11-03 ENCOUNTER — Ambulatory Visit (HOSPITAL_COMMUNITY): Payer: 59 | Admitting: Clinical

## 2021-11-04 ENCOUNTER — Other Ambulatory Visit (HOSPITAL_COMMUNITY): Payer: Self-pay

## 2021-11-06 ENCOUNTER — Other Ambulatory Visit (HOSPITAL_COMMUNITY): Payer: Self-pay

## 2021-11-07 ENCOUNTER — Telehealth: Payer: Self-pay

## 2021-11-07 ENCOUNTER — Other Ambulatory Visit (HOSPITAL_COMMUNITY): Payer: Self-pay

## 2021-11-07 NOTE — Telephone Encounter (Signed)
PA submitted and approved for [facility] via covermymeds. ?

## 2021-11-08 ENCOUNTER — Other Ambulatory Visit (HOSPITAL_COMMUNITY): Payer: Self-pay

## 2021-11-08 DIAGNOSIS — M659 Synovitis and tenosynovitis, unspecified: Secondary | ICD-10-CM | POA: Diagnosis not present

## 2021-11-08 DIAGNOSIS — M25371 Other instability, right ankle: Secondary | ICD-10-CM | POA: Diagnosis not present

## 2021-11-13 ENCOUNTER — Other Ambulatory Visit (HOSPITAL_COMMUNITY): Payer: Self-pay

## 2021-11-14 ENCOUNTER — Other Ambulatory Visit (HOSPITAL_COMMUNITY): Payer: Self-pay

## 2021-11-15 ENCOUNTER — Ambulatory Visit (INDEPENDENT_AMBULATORY_CARE_PROVIDER_SITE_OTHER): Payer: 59 | Admitting: Clinical

## 2021-11-15 DIAGNOSIS — F4310 Post-traumatic stress disorder, unspecified: Secondary | ICD-10-CM | POA: Diagnosis not present

## 2021-11-15 DIAGNOSIS — F419 Anxiety disorder, unspecified: Secondary | ICD-10-CM

## 2021-11-15 DIAGNOSIS — F331 Major depressive disorder, recurrent, moderate: Secondary | ICD-10-CM | POA: Diagnosis not present

## 2021-11-16 ENCOUNTER — Encounter: Payer: 59 | Attending: Physical Medicine & Rehabilitation | Admitting: Physical Medicine & Rehabilitation

## 2021-11-16 ENCOUNTER — Encounter: Payer: Self-pay | Admitting: Physical Medicine & Rehabilitation

## 2021-11-16 VITALS — BP 114/77 | HR 82 | Temp 98.0°F | Ht 64.0 in | Wt 224.0 lb

## 2021-11-16 DIAGNOSIS — M7918 Myalgia, other site: Secondary | ICD-10-CM | POA: Diagnosis not present

## 2021-11-16 DIAGNOSIS — M47817 Spondylosis without myelopathy or radiculopathy, lumbosacral region: Secondary | ICD-10-CM | POA: Insufficient documentation

## 2021-11-16 DIAGNOSIS — M7061 Trochanteric bursitis, right hip: Secondary | ICD-10-CM | POA: Insufficient documentation

## 2021-11-16 NOTE — Progress Notes (Signed)
Pre procedure reviewed for a Medial Branch Block for 01/05/2022 . Will not have to stop plavix and may drive herself. ?
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
## 2021-11-16 NOTE — Patient Instructions (Signed)
Hip Bursitis Rehab Ask your health care provider which exercises are safe for you. Do exercises exactly as told by your health care provider and adjust them as directed. It is normal to feel mild stretching, pulling, tightness, or discomfort as you do these exercises. Stop right away if you feel sudden pain or your pain gets worse. Do not begin these exercises until told by your health care provider. Stretching exercise This exercise warms up your muscles and joints and improves the movement and flexibility of your hip. This exercise also helps to relieve pain and stiffness. Iliotibial band stretch An iliotibial band is a strong band of muscle tissue that runs from the outer side of your hip to the outer side of your thigh and knee. Lie on your side with your left / right leg in the top position. Bend your left / right knee and grab your ankle. Stretch out your bottom arm to help you balance. Slowly bring your knee back so your thigh is slightly behind your body. Slowly lower your knee toward the floor until you feel a gentle stretch on the outside of your left / right thigh. If you do not feel a stretch and your knee will not lower more toward the floor, place the heel of your other foot on top of your knee and pull your knee down toward the floor with your foot. Hold this position for __________ seconds. Slowly return to the starting position. Repeat __________ times. Complete this exercise __________ times a day. Strengthening exercises These exercises build strength and endurance in your hip and pelvis. Endurance is the ability to use your muscles for a long time, even after they get tired. Bridge This exercise strengthens the muscles that move your thigh backward (hip extensors). Lie on your back on a firm surface with your knees bent and your feet flat on the floor. Tighten your buttocks muscles and lift your buttocks off the floor until your trunk is level with your thighs. Do not arch your  back. You should feel the muscles working in your buttocks and the back of your thighs. If you do not feel these muscles, slide your feet 1-2 inches (2.5-5 cm) farther away from your buttocks. If this exercise is too easy, try doing it with your arms crossed over your chest. Hold this position for __________ seconds. Slowly lower your hips to the starting position. Let your muscles relax completely after each repetition. Repeat __________ times. Complete this exercise __________ times a day. Squats This exercise strengthens the muscles in front of your thigh and knee (quadriceps). Stand in front of a table, with your feet and knees pointing straight ahead. You may rest your hands on the table for balance but not for support. Slowly bend your knees and lower your hips like you are going to sit in a chair. Keep your weight over your heels, not over your toes. Keep your lower legs upright so they are parallel with the table legs. Do not let your hips go lower than your knees. Do not bend lower than told by your health care provider. If your hip pain increases, do not bend as low. Hold the squat position for __________ seconds. Slowly push with your legs to return to standing. Do not use your hands to pull yourself to standing. Repeat __________ times. Complete this exercise __________ times a day. Hip hike  Stand sideways on a bottom step. Stand on your left / right leg with your other foot unsupported next to   the step. You can hold on to the railing or wall for balance if needed. Keep your knees straight and your torso square. Then lift your left / right hip up toward the ceiling. Hold this position for __________ seconds. Slowly let your left / right hip lower toward the floor, past the starting position. Your foot should get closer to the floor. Do not lean or bend your knees. Repeat __________ times. Complete this exercise __________ times a day. Single leg stand This exercise increases  your balance. Without shoes, stand near a railing or in a doorway. You may hold on to the railing or door frame as needed for balance. Squeeze your left / right buttock muscles, then lift up your other foot. Do not let your left / right hip push out to the side. It is helpful to stand in front of a mirror for this exercise so you can watch your hip. Hold this position for __________ seconds. Repeat __________ times. Complete this exercise __________ times a day. This information is not intended to replace advice given to you by your health care provider. Make sure you discuss any questions you have with your health care provider. Document Revised: 06/28/2021 Document Reviewed: 06/28/2021 Elsevier Patient Education  2023 Elsevier Inc.  

## 2021-11-16 NOTE — Progress Notes (Signed)
? ?Subjective:  ? ? Patient ID: Desiree Russell, female    DOB: [birth year], 51 y.o.   MRN: [id number] ? ?HPI ? ?CC:  Right sided mid and low back pain  ? ?51yo female with 2-3 yr RIght sided low and mid back pain , insidious onset, worsening with time.   ?MRI of CTL spine as below  ?Pain is described as constant throbbing moderate in severity.  Worse during the morning and evening hours sleep is fair.  Pain increases with standing relieved by bending forward.  He does help as well. ?Walking tolerance is approximately 15 minutes.  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  She is independent with all her self-care needs help with certain household duties. ?She exercises 4 days a week with biking stretching and walking for 30 to 60 minutes per exercise session ?She uses Biofreeze and Aspercreme for her pain.  She has tried Voltaren gel in the past however this did not help much for her knees.  She only uses it on a as needed basis we discussed that it should be used 3 times daily or 4 times daily for maximum effect. ? ?Saw Dr Sharlet Salina in Sept/Oct 2022, RIght L5 and RIght S1 ESI under fluoro not helpful ? ?Seen by Dr Angie Fava at [facility], Hydrocodone up to '10mg'$  did not help caused itching ?Has Morphine and codeine allergies which also cause itching  ? ?Has tried PT for back did exercise and stretching and TENs , no lasting relief  ? ?Bilataeral ankle sprain several years ago after falling down steps ? ?CLINICAL DATA:  Right hip pain radiating throughout the leg with ?weakness. The patient suffered a ligament tear in the right ankle ?due to an injury 3 months ago. ?  ?EXAM: ?MR OF THE RIGHT HIP WITHOUT CONTRAST ?  ?TECHNIQUE: ?Multiplanar, multisequence MR imaging was performed. No intravenous ?contrast was administered. ?  ?COMPARISON:  None. ?  ?FINDINGS: ?Bones: Marrow signal is normal without fracture, stress change or ?worrisome lesion. No avascular necrosis of the femoral heads. No ?subchondral cyst formation or edema  about the hips. ?  ?Articular cartilage and labrum ?  ?Articular cartilage:  Preserved. ?  ?Labrum:  Intact. ?  ?Joint or bursal effusion ?  ?Joint effusion:  None. ?  ?Bursae: Negative. ?  ?Muscles and tendons ?  ?Muscles and tendons:  Intact and normal in appearance. ?  ?Other findings ?  ?Miscellaneous: There is a septated left ovarian lesion measuring 5.4 ?cm craniocaudal by 3.2 cm transverse. A moderate volume of free ?pelvic fluid is greater than typically seen in physiologic change. ?  ?IMPRESSION: ?5.4 cm left ovarian lesion with a larger than typically seen volume ?of free pelvic fluid. Pelvic ultrasound is recommended for further ?evaluation. ?  ?Normal appearing right hip. ?  ?These results will be called to the ordering clinician or ?representative by the Radiologist Assistant, and communication ?documented in the PACS or [employer]. ?  ?  ?Electronically Signed ?  By: Inge Rise M.D. ?  On: 10/11/2019 16:49 ? ?MRI CERVICAL AND THORACIC SPINE WITHOUT CONTRAST ?  ?TECHNIQUE: ?Multiplanar and multiecho pulse sequences of the cervical spine, to ?include the craniocervical junction and cervicothoracic junction, ?and the thoracic spine, were obtained without intravenous contrast. ?  ?COMPARISON:  12/27/2011 and 02/27/2016 cervical spine radiographs. ?  ?FINDINGS: ?MRI CERVICAL SPINE FINDINGS ?  ?Alignment: Straightening of cervical lordosis with reversal. No ?listhesis. ?  ?Vertebrae: Normal bone marrow signal intensity. No focal osseous ?  lesion. ?  ?Cord: Normal signal and morphology. ?  ?Posterior Fossa, vertebral arteries: Negative. ?  ?Disc levels: Multilevel endplate osteophytosis, desiccation and mild ?disc space loss most prominent at C4-C7 levels. ?  ?C2-3: Small central protrusion with uncovertebral degenerative ?spurring. No significant spinal canal or neural foraminal narrowing. ?  ?C3-4: Small disc osteophyte complex with superimposed central ?protrusion. Mild uncovertebral and  bilateral facet hypertrophy. Mild ?left neural foraminal narrowing. No significant spinal canal ?narrowing. ?  ?C4-5: Central protrusion abutting the ventral cord with ?uncovertebral and bilateral facet hypertrophy. Mild spinal canal ?narrowing. No significant neural foraminal narrowing. ?  ?C5-6: Disc osteophyte complex abutting the ventral cord with mild ?uncovertebral and bilateral facet hypertrophy. Mild spinal canal and ?bilateral neural foraminal narrowing. ?  ?C6-7: Disc osteophyte complex with superimposed central and left ?subarticular protrusions effacing the left lateral recess, bilateral ?uncovertebral and facet hypertrophy. Mild spinal canal and left ?neural foraminal narrowing. ?  ?C7-T1: No significant disc bulge, spinal canal or neural foraminal ?narrowing. ?  ?Paraspinal tissues: Negative. ?  ?MRI THORACIC SPINE FINDINGS ?  ?Alignment:  Normal. ?  ?Vertebrae: Normal bone marrow signal intensity. No focal osseous ?lesions. ?  ?Cord:  Normal signal and morphology. ?  ?Paraspinal and other soft tissues: Negative. ?  ?Disc levels: ?  ?Small posterior disc bulge and right facet hypertrophy resulting in ?mild left neural foraminal narrowing at the T2-3 level. No ?significant spinal canal or neural foraminal narrowing at the ?remaining thoracic levels. ?  ?IMPRESSION: ?Multilevel cervical spondylosis with mild C4-7 spinal canal ?narrowing. ?  ?Mild neural foraminal narrowing at the C3-4, C5-6 and C6-7 levels. ?  ?Mild right T2-3 neural foraminal narrowing. ?  ?  ?Electronically Signed ?  By: Primitivo Gauze M.D. ?  On: 11/12/2019 12:22 ?  ? ?EXAM: ?MRI LUMBAR SPINE WITHOUT CONTRAST ?  ?TECHNIQUE: ?Multiplanar, multisequence MR imaging of the lumbar spine was ?performed. No intravenous contrast was administered. ?  ?COMPARISON:  10/11/2019 ?  ?FINDINGS: ?Segmentation:  Standard. ?  ?Alignment:  Normal. ?  ?Vertebrae: No fracture, suspicious marrow lesion, or significant ?marrow edema. ?  ?Conus  medullaris and cauda equina: Conus extends to the L1-2 level. ?Conus and cauda equina appear normal. ?  ?Paraspinal and other soft tissues: Unremarkable. ?  ?Disc levels: ?  ?The lumbar spinal canal is capacious. Disc height and hydration are ?maintained from T11-12 to L4-5. At L5-S1, there is disc desiccation, ?minimal disc bulging, a shallow right central disc protrusion with ?annular fissure without associated stenosis. ?  ?Lower lumbar facet arthrosis is again noted, moderate to severe on ?the right at L4-5 and moderate bilaterally at L5-S1. Right-sided ?facet arthrosis at L4-5 has likely mildly progressed, and there is a ?joint effusion which is larger than on the prior study. ?  ?IMPRESSION: ?1. Unchanged minimal disc bulging and shallow right central disc ?protrusion at L5-S1 without stenosis. ?2. Lower lumbar facet arthrosis, most severe on the right at L4-5. ?  ?  ?Electronically Signed ?  By: Logan Bores M.D. ?  On: 03/10/2021 13:03 ?  ? ?Pain Inventory ?Average Pain 6 ?Pain Right Now 4 ?My pain is constant, aching, and throbbing ? ?In the last 24 hours, has pain interfered with the following? ?General activity 4 ?Relation with others 0 ?Enjoyment of life 5 ?What TIME of day is your pain at its worst? morning  and evening ?Sleep (in general) Fair ? ?Pain is worse with: bending, standing, and some activites ?Pain improves with: heat/ice and medication ?  Relief from Meds: 5 ? ?walk without assistance ?how many minutes can you walk? 15 ?ability to climb steps?  yes ?do you drive?  yes ?Do you have any goals in this area?  yes ? ?employed # of hrs/week 40 hours per week ?I need assistance with the following:  household duties ? ?spasms ?depression ?anxiety ? ?Any changes since last visit?  no ? ?Any changes since last visit?  no ? ? ? ?Family History  ?Problem Relation Age of Onset  ? Hypertension Mother   ? Hypertension Father   ? Heart attack Father 50  ? Healthy Sister   ? Healthy Sister   ? Healthy Sister    ? Hypertension Brother   ? Diabetes type II Brother   ? COPD Brother   ? Healthy Brother   ? Healthy Brother   ? Breast cancer Maternal Aunt   ? Throat cancer Maternal Uncle   ? Hypertension Maternal Grandmother   ? D

## 2021-11-22 ENCOUNTER — Encounter: Payer: Self-pay | Admitting: Physical Medicine & Rehabilitation

## 2021-11-23 ENCOUNTER — Encounter: Payer: Self-pay | Admitting: Physical Medicine & Rehabilitation

## 2021-11-24 ENCOUNTER — Telehealth: Payer: Self-pay | Admitting: *Deleted

## 2021-11-24 NOTE — Telephone Encounter (Signed)
Fax sent to Dr Gurney Maxin neurologist @ [facility] in [place] requesting permission for Desiree Russell to take Celebrex for pain per Dr Letta Pate.  Await response. ?

## 2021-12-04 ENCOUNTER — Other Ambulatory Visit (HOSPITAL_COMMUNITY): Payer: Self-pay

## 2021-12-05 ENCOUNTER — Encounter: Payer: Self-pay | Admitting: Nurse Practitioner

## 2021-12-06 ENCOUNTER — Other Ambulatory Visit (HOSPITAL_COMMUNITY): Payer: Self-pay

## 2021-12-06 ENCOUNTER — Other Ambulatory Visit: Payer: Self-pay | Admitting: Nurse Practitioner

## 2021-12-06 ENCOUNTER — Encounter: Payer: Self-pay | Admitting: *Deleted

## 2021-12-06 MED ORDER — CELECOXIB 100 MG PO CAPS
100.0000 mg | ORAL_CAPSULE | Freq: Two times a day (BID) | ORAL | 1 refills | Status: DC
  Filled 2021-12-06: qty 60, 30d supply, fill #0

## 2021-12-06 NOTE — Addendum Note (Signed)
Addended by: Charlett Blake on: 12/06/2021 10:56 AM ? ? Modules accepted: Orders ? ?

## 2021-12-07 ENCOUNTER — Encounter (HOSPITAL_COMMUNITY): Payer: Self-pay | Admitting: Psychiatry

## 2021-12-07 ENCOUNTER — Other Ambulatory Visit (HOSPITAL_COMMUNITY): Payer: Self-pay

## 2021-12-07 ENCOUNTER — Telehealth (HOSPITAL_BASED_OUTPATIENT_CLINIC_OR_DEPARTMENT_OTHER): Payer: 59 | Admitting: Psychiatry

## 2021-12-07 DIAGNOSIS — F419 Anxiety disorder, unspecified: Secondary | ICD-10-CM | POA: Diagnosis not present

## 2021-12-07 DIAGNOSIS — F4310 Post-traumatic stress disorder, unspecified: Secondary | ICD-10-CM | POA: Diagnosis not present

## 2021-12-07 DIAGNOSIS — F331 Major depressive disorder, recurrent, moderate: Secondary | ICD-10-CM | POA: Diagnosis not present

## 2021-12-07 MED ORDER — ALPRAZOLAM 0.5 MG PO TABS
0.5000 mg | ORAL_TABLET | Freq: Two times a day (BID) | ORAL | 2 refills | Status: DC | PRN
  Filled 2021-12-07 – 2022-01-09 (×2): qty 60, 30d supply, fill #0
  Filled 2022-02-27: qty 60, 30d supply, fill #1

## 2021-12-07 MED ORDER — OLANZAPINE-FLUOXETINE HCL 3-25 MG PO CAPS
1.0000 | ORAL_CAPSULE | Freq: Every evening | ORAL | 2 refills | Status: DC
  Filled 2021-12-07: qty 30, 30d supply, fill #0
  Filled 2022-01-09: qty 30, 30d supply, fill #1
  Filled 2022-02-27: qty 30, 30d supply, fill #2

## 2021-12-07 MED ORDER — VORTIOXETINE HBR 10 MG PO TABS
10.0000 mg | ORAL_TABLET | Freq: Every day | ORAL | 2 refills | Status: DC
  Filled 2021-12-07 – 2022-01-05 (×2): qty 30, 30d supply, fill #0
  Filled 2022-02-05: qty 30, 30d supply, fill #1
  Filled 2022-03-09: qty 30, 30d supply, fill #2

## 2021-12-07 MED ORDER — LORATADINE 10 MG PO TABS
10.0000 mg | ORAL_TABLET | Freq: Every day | ORAL | 1 refills | Status: DC
  Filled 2021-12-07: qty 90, 90d supply, fill #0
  Filled 2022-03-14 – 2022-05-03 (×2): qty 90, 90d supply, fill #1
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
## 2021-12-11 ENCOUNTER — Other Ambulatory Visit (HOSPITAL_COMMUNITY): Payer: Self-pay

## 2021-12-12 ENCOUNTER — Ambulatory Visit: Payer: 59 | Admitting: Nurse Practitioner

## 2021-12-20 ENCOUNTER — Other Ambulatory Visit (HOSPITAL_COMMUNITY): Payer: Self-pay

## 2021-12-20 ENCOUNTER — Ambulatory Visit: Payer: 59 | Admitting: Nurse Practitioner

## 2021-12-20 ENCOUNTER — Encounter: Payer: Self-pay | Admitting: Nurse Practitioner

## 2021-12-20 VITALS — BP 136/82 | HR 97 | Temp 97.5°F | Ht 64.0 in | Wt 218.0 lb

## 2021-12-20 DIAGNOSIS — G2581 Restless legs syndrome: Secondary | ICD-10-CM

## 2021-12-20 DIAGNOSIS — R350 Frequency of micturition: Secondary | ICD-10-CM

## 2021-12-20 DIAGNOSIS — E559 Vitamin D deficiency, unspecified: Secondary | ICD-10-CM

## 2021-12-20 LAB — POCT URINALYSIS DIP (CLINITEK)
Bilirubin, UA: NEGATIVE
Blood, UA: NEGATIVE
Glucose, UA: NEGATIVE mg/dL
Ketones, POC UA: NEGATIVE mg/dL
Leukocytes, UA: NEGATIVE
Nitrite, UA: NEGATIVE
Spec Grav, UA: >=1.03 — AB (ref 1.010–1.025)
Urobilinogen, UA: 0.2 E.U./dL
pH, UA: 5.5 (ref 5.0–8.0)

## 2021-12-20 MED ORDER — GABAPENTIN 300 MG PO CAPS
900.0000 mg | ORAL_CAPSULE | Freq: Every day | ORAL | 3 refills | Status: DC
  Filled 2021-12-20: qty 270, 90d supply, fill #0
  Filled 2022-05-03: qty 270, 90d supply, fill #1

## 2021-12-20 NOTE — Patient Instructions (Addendum)
Increase Gabapentin to 900 mg at bedtime We will call you with lab results Iron rich diet Follow-up in 65-month   Restless Legs Syndrome Restless legs syndrome is a condition that causes uncomfortable feelings or sensations in the legs, especially while sitting or lying down. The sensations usually cause an overwhelming urge to move the legs. The arms can also sometimes be affected. The condition can range from mild to severe. The symptoms often interfere with a person's ability to sleep. What are the causes? The cause of this condition is not known. What increases the risk? The following factors may make you more likely to develop this condition: Being older than 50. Pregnancy. Being a woman. In general, the condition is more common in women than in men. A family history of the condition. Having iron deficiency. Overuse of caffeine, nicotine, or alcohol. Certain medical conditions, such as kidney disease, Parkinson's disease, or nerve damage. Certain medicines, such as those for high blood pressure, nausea, colds, allergies, depression, and some heart conditions. What are the signs or symptoms? The main symptom of this condition is uncomfortable sensations in the legs, such as: Pulling. Tingling. Prickling. Throbbing. Crawling. Burning. Usually, the sensations: Affect both sides of the body. Are worse when you sit or lie down. Are worse at night. These may make it difficult to fall asleep. Make you have a strong urge to move your legs. Are temporarily relieved by moving your legs or standing. The arms can also be affected, but this is rare. People who have this condition often have tiredness during the day because of their lack of sleep at night. How is this diagnosed? This condition may be diagnosed based on: Your symptoms. Blood tests. In some cases, you may be monitored in a sleep lab by a specialist (a sleep study). This can detect any disruptions in your sleep. How is  this treated? This condition is treated by managing the symptoms. This may include: Lifestyle changes, such as exercising, using relaxation techniques, and avoiding caffeine, alcohol, or tobacco. Iron supplements. Medicines. Parkinson's medications may be tried first. Anti-seizure medications can also be helpful. Follow these instructions at home: General instructions Take over-the-counter and prescription medicines only as told by your health care provider. Use methods to help relieve the uncomfortable sensations, such as: Massaging your legs. Walking or stretching. Taking a cold or hot bath. Keep all follow-up visits. This is important. Lifestyle     Practice good sleep habits. For example, go to bed and get up at the same time every day. Most adults should get 7-9 hours of sleep each night. Exercise regularly. Try to get at least 30 minutes of exercise most days of the week. Practice ways of relaxing, such as yoga or meditation. Avoid caffeine and alcohol. Do not use any products that contain nicotine or tobacco. These products include cigarettes, chewing tobacco, and vaping devices, such as e-cigarettes. If you need help quitting, ask your health care provider. Where to find more information [employer] Neurological Disorders and Stroke: wMasterBoxes.itContact a health care provider if: Your symptoms get worse or they do not improve with treatment. Summary Restless legs syndrome is a condition that causes uncomfortable feelings or sensations in the legs, especially while sitting or lying down. The symptoms often interfere with your ability to sleep. This condition is treated by managing the symptoms. You may need to make lifestyle changes or take medicines. This information is not intended to replace advice given to you by your health care provider.  Make sure you discuss any questions you have with your health care provider. Document Revised: 02/26/2021 Document  Reviewed: 02/26/2021 Elsevier Patient Education  [place]. Iron-Rich Diet  Iron is a mineral that helps your body produce hemoglobin. Hemoglobin is a protein in red blood cells that carries oxygen to your body's tissues. Eating too little iron may cause you to feel weak and tired, and it can increase your risk of infection. Iron is naturally found in many foods, and many foods have iron added to them (are iron-fortified). You may need to follow an iron-rich diet if you do not have enough iron in your body due to certain medical conditions. The amount of iron that you need each day depends on your age, your sex, and any medical conditions you have. Follow instructions from your health care provider or a dietitian about how much iron you should eat each day. What are tips for following this plan? Reading food labels Check food labels to see how many milligrams (mg) of iron are in each serving. Cooking Cook foods in pots and pans that are made from iron. Take these steps to make it easier for your body to absorb iron from certain foods: Soak beans overnight before cooking. Soak whole grains overnight and drain them before using. Ferment flours before baking, such as by using yeast in bread dough. Meal planning When you eat foods that contain iron, you should eat them with foods that are high in vitamin C. These include oranges, peppers, tomatoes, potatoes, and mangoes. Vitamin C helps your body absorb iron. Certain foods and drinks prevent your body from absorbing iron properly. Avoid eating these foods in the same meal as iron-rich foods or with iron supplements. These foods include: Coffee, black tea, and red wine. Milk, dairy products, and foods that are high in calcium. Beans and soybeans. Whole grains. General information Take iron supplements only as told by your health care provider. An overdose of iron can be life-threatening. If you were prescribed iron supplements, take them  with orange juice or a vitamin C supplement. When you eat iron-fortified foods or take an iron supplement, you should also eat foods that naturally contain iron, such as meat, poultry, and fish. Eating naturally iron-rich foods helps your body absorb the iron that is added to other foods or contained in a supplement. Iron from animal sources is better absorbed than iron from plant sources. What foods should I eat? Fruits Prunes. Raisins. Eat fruits high in vitamin C, such as oranges, grapefruits, and strawberries, with iron-rich foods. Vegetables Spinach (cooked). Green peas. Broccoli. Fermented vegetables. Eat vegetables high in vitamin C, such as leafy greens, potatoes, bell peppers, and tomatoes, with iron-rich foods. Grains Iron-fortified breakfast cereal. Iron-fortified whole-wheat bread. Enriched rice. Sprouted grains. Meats and other proteins Beef liver. Beef. [place]. Chicken. Oysters. Shrimp. [employer] [place]. Sardines. Chickpeas. Nuts. Tofu. Pumpkin seeds. Beverages Tomato juice. Fresh orange juice. Prune juice. Hibiscus tea. Iron-fortified instant breakfast shakes. Sweets and desserts Blackstrap molasses. Seasonings and condiments Tahini. Fermented soy sauce. Other foods Wheat germ. The items listed above may not be a complete list of recommended foods and beverages. Contact a dietitian for more information. What foods should I limit? These are foods that should be limited while eating iron-rich foods as they can reduce the absorption of iron in your body. Grains Whole grains. Bran cereal. Bran flour. Meats and other proteins Soybeans. Products made from soy protein. Black beans. Lentils. Mung beans. Split peas. Dairy Milk. Cream. Cheese. Yogurt.  Cottage cheese. Beverages Coffee. Black tea. Red wine. Sweets and desserts Cocoa. Chocolate. Ice cream. Seasonings and condiments Basil. Oregano. Large amounts of parsley. The items listed above may not be a complete list of foods and  beverages you should limit. Contact a dietitian for more information. Summary Iron is a mineral that helps your body produce hemoglobin. Hemoglobin is a protein in red blood cells that carries oxygen to your body's tissues. Iron is naturally found in many foods, and many foods have iron added to them (are iron-fortified). When you eat foods that contain iron, you should eat them with foods that are high in vitamin C. Vitamin C helps your body absorb iron. Certain foods and drinks prevent your body from absorbing iron properly, such as whole grains and dairy products. You should avoid eating these foods in the same meal as iron-rich foods or with iron supplements. This information is not intended to replace advice given to you by your health care provider. Make sure you discuss any questions you have with your health care provider. Document Revised: 06/27/2020 Document Reviewed: 06/27/2020 Elsevier Patient Education  [place].

## 2021-12-20 NOTE — Progress Notes (Signed)
Subjective:  Patient ID: Desiree Russell, female    DOB: 10-20-1970  Age: 51 y.o. MRN: [id number]  Chief Complaint  Patient presents with   RLS   Urinary Tract Infection    HPI   Patient states her legs hurt in the evening, has been sleeping with her legs hanging off the bed. She is prescribed Gabapentin 600 mg QHS. States she has a history of anemia and vit D deficiency ins the past.  C/o UTI symptoms dysuria, urgency and frequency for 10 days. Treatment has included pushing fluids.  Current Outpatient Medications on File Prior to Visit  Medication Sig Dispense Refill   acetaminophen (TYLENOL) 325 MG tablet Take 650 mg by mouth every 6 (six) hours as needed for mild pain, fever or headache.     albuterol (VENTOLIN HFA) 108 (90 Base) MCG/ACT inhaler INHALE 2 PUFFS INTO THE LUNGS EVERY 4 HOURS AS NEEDED FOR WHEEZING. (Patient taking differently: Inhale 2 puffs into the lungs every 4 (four) hours as needed for wheezing.) 8.5 g 0   ALPRAZolam (XANAX) 0.5 MG tablet Take 1 tablet by mouth 2 times daily as needed for anxiety. 60 tablet 2   aspirin 81 MG EC tablet Take 1 tablet (81 mg total) by mouth daily. 21 tablet 0   azelastine (ASTELIN) 0.1 % nasal spray Place 1 spray into both nostrils 2 (two) times daily. Use in each nostril as directed 30 mL 12   Calcium-Vitamin D-Vitamin K 650-12.5-40 MG-MCG-MCG CHEW Chew 1 tablet by mouth daily.     celecoxib (CELEBREX) 100 MG capsule Take 1 capsule (100 mg total) by mouth 2 (two) times daily. 60 capsule 1   cephALEXin (KEFLEX) 250 MG capsule Take 1 capsule by mouth once daily after relations as directed. 30 capsule 3   clopidogrel (PLAVIX) 75 MG tablet Take 1 tablet by mouth once daily 90 tablet 1   cyclobenzaprine (FLEXERIL) 10 MG tablet Take 5-10 mg by mouth at bedtime as needed for muscle spasms.     furosemide (LASIX) 20 MG tablet Take 1 tablet by mouth daily as needed. 90 tablet 3   gabapentin (NEURONTIN) 600 MG tablet Take 1 tablet  by mouth at  bedtime. 90 tablet 1   Galcanezumab-gnlm (EMGALITY) 120 MG/ML SOAJ Inject '120mg'$  once monthly 3 mL 1   HYDROcodone-acetaminophen (NORCO) 10-325 MG tablet Take 1 tablet by mouth 3 times daily. 90 tablet 0   loratadine (CLARITIN) 10 MG tablet Take 1 tablet (10 mg total) by mouth daily. 90 tablet 1   metoprolol succinate (TOPROL-XL) 25 MG 24 hr tablet Take 1 tablet by mouth daily. 90 tablet 3   Multiple Vitamin (MULTIVITAMIN WITH MINERALS) TABS tablet Take 1 tablet by mouth daily.     OLANZapine-FLUoxetine (SYMBYAX) 3-25 MG capsule Take 1 capsule by mouth every evening. 30 capsule 2   ondansetron (ZOFRAN) 4 MG tablet Take 1 tablet by mouth every 8 hours as needed for nausea or vomiting. 30 tablet 3   oxybutynin (DITROPAN-XL) 5 MG 24 hr tablet Take 1 (one) tablet by mouth at night for overactive bladder/urinary frequency.  Replaces tolterodine. 30 tablet 2   pantoprazole (PROTONIX) 40 MG tablet Take 1 tablet by mouth daily. 90 tablet 1   Semaglutide-Weight Management (WEGOVY) 2.4 MG/0.75ML SOAJ Inject 2.4 mg into the skin once a week. 3 mL 3   senna-docusate (SENOKOT-S) 8.6-50 MG tablet Take 2 tablets by mouth daily.     Ubrogepant (UBRELVY) 100 MG TABS TAKE  1/2 TABLET AT HEADACHE ONSET.  MAY REPEAT DOSE IN TWO HOURS IF NO IMPROVEMENT. DO NOT EXCEED MORE THAN TWO TABLETS IN 24 HOURS. (Patient taking differently: Take 50-100 mg by mouth See admin instructions. Take '50mg'$  at headache onset, may repeat dose in two hours if no improvement. Do not excess more than '200mg'$  in 24 hours) 18 tablet 1   vortioxetine HBr (TRINTELLIX) 10 MG TABS tablet Take 1 tablet by mouth daily. 30 tablet 2   No current facility-administered medications on file prior to visit.   Past Medical History:  Diagnosis Date   Allergy    Anemia    Anxiety    Arthritis    Dyslipidemia    Fibromyalgia    GERD (gastroesophageal reflux disease)    Headache    Migraines    Pericarditis    Stroke ([place]) 12/2020   Tachycardia    TIA  (transient ischemic attack)    Recurrent   Past Surgical History:  Procedure Laterality Date   ABDOMINAL HYSTERECTOMY     CHOLECYSTECTOMY     CHOLECYSTECTOMY, LAPAROSCOPIC     COLONOSCOPY     UPPER GASTROINTESTINAL ENDOSCOPY      Family History  Problem Relation Age of Onset   Hypertension Mother    Hypertension Father    Heart attack Father 48   Healthy Sister    Healthy Sister    Healthy Sister    Hypertension Brother    Diabetes type II Brother    COPD Brother    Healthy Brother    Healthy Brother    Breast cancer Maternal Aunt    Throat cancer Maternal Uncle    Hypertension Maternal Grandmother    Diabetes Paternal Geophysicist/field seismologist    Healthy Daughter    Healthy Son    Healthy Son    Healthy Son    Colon cancer Neg Hx    Pancreatic cancer Neg Hx    Stomach cancer Neg Hx    Liver disease Neg Hx    Esophageal cancer Neg Hx    Rectal cancer Neg Hx    Social History   Socioeconomic History   Marital status: Married    Spouse name: Not on file   Number of children: 4   Years of education: Not on file   Highest education level: Not on file  Occupational History   Not on file  Tobacco Use   Smoking status: Never   Smokeless tobacco: Never  Vaping Use   Vaping Use: Never used  Substance and Sexual Activity   Alcohol use: Yes    Comment: occ    Drug use: No   Sexual activity: Not on file  Other Topics Concern   Not on file  Social History Narrative   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     Social Determinants of Health   Financial Resource Strain: Not on file  Food Insecurity: Not on file  Transportation Needs: Not on file  Physical Activity: Not on file  Stress: Not on file  Social Connections: Not on file    Review of Systems  Constitutional:  Negative for chills, fatigue and fever.  HENT:  Negative for congestion, ear pain, rhinorrhea and sore throat.   Respiratory:  Negative for cough and shortness of breath.    Cardiovascular:  Negative for chest pain.  Gastrointestinal:  Negative for abdominal pain, constipation, diarrhea, nausea and vomiting.  Genitourinary:  Positive for dysuria, frequency and urgency.  Musculoskeletal:  Negative for back pain and myalgias.  Neurological:  Negative for dizziness, weakness, light-headedness and headaches.  Psychiatric/Behavioral:  Negative for dysphoric mood. The patient is not nervous/anxious.     Objective:  BP 136/82   Pulse 97   Temp (!) 97.5 F (36.4 C)   Ht '5\' 4"'$  (1.626 m)   Wt 218 lb (98.9 kg)   SpO2 99%   BMI 37.42 kg/m       12/20/2021    7:38 AM 12/07/2021    4:00 PM 11/16/2021    2:49 PM  BP/Weight  Systolic BP   109  Diastolic BP   77  Wt. (Lbs) 218  224  BMI 37.42 kg/m2  38.45 kg/m2     Information is confidential and restricted. Go to Review Flowsheets to unlock data.    Physical Exam Vitals reviewed.  Constitutional:      Appearance: Normal appearance.  HENT:     Right Ear: Tympanic membrane normal.     Left Ear: Tympanic membrane normal.     Mouth/Throat:     Pharynx: Posterior oropharyngeal erythema present.  Cardiovascular:     Rate and Rhythm: Normal rate and regular rhythm.     Pulses: Normal pulses.     Heart sounds: Normal heart sounds.  Pulmonary:     Effort: Pulmonary effort is normal.     Breath sounds: Normal breath sounds.  Abdominal:     General: Bowel sounds are normal.     Palpations: Abdomen is soft.     Tenderness: There is no right CVA tenderness or left CVA tenderness.  Skin:    General: Skin is warm and dry.     Capillary Refill: Capillary refill takes less than 2 seconds.  Neurological:     General: No focal deficit present.     Mental Status: She is alert and oriented to person, place, and time.        Lab Results  Component Value Date   WBC 8.9 10/13/2021   HGB 12.8 10/13/2021   HCT 39.0 10/13/2021   PLT 250 10/13/2021   GLUCOSE 79 10/13/2021   CHOL 211 (H) 10/13/2021   TRIG 93  10/13/2021   HDL 57 10/13/2021   LDLCALC 137 (H) 10/13/2021   ALT 11 10/13/2021   AST 12 10/13/2021   NA 138 10/13/2021   K 4.0 10/13/2021   CL 102 10/13/2021   CREATININE 1.01 (H) 10/13/2021   BUN 11 10/13/2021   CO2 23 10/13/2021   TSH 0.512 07/14/2021   INR 1.1 03/10/2021   HGBA1C 5.5 07/14/2021      Assessment & Plan:   1. RLS (restless legs syndrome) - gabapentin (NEURONTIN) 300 MG capsule; Take 3 capsules by mouth at bedtime.  Dispense: 270 capsule; Refill: 3 - Iron, TIBC and Ferritin Panel - B12 and Folate Panel - Vitamin D, 25-hydroxy -iron rich diet  2. Urinary frequency - POCT URINALYSIS DIP (CLINITEK)-normal, push fluids   3. Vitamin D deficiency - Vitamin D, 25-hydroxy      Increase Gabapentin to 900 mg at bedtime We will call you with lab results Iron rich diet Follow-up in 52-month   Follow-up: 36-month fasting  An After Visit Summary was printed and given to the patient.   I, ShRip HarbourNP, have reviewed all documentation for this visit. The documentation on 12/20/21 for the exam, diagnosis, procedures, and orders are all accurate and complete.    Signed, ShRip HarbourNP [phone]

## 2021-12-21 ENCOUNTER — Other Ambulatory Visit (HOSPITAL_COMMUNITY): Payer: Self-pay

## 2021-12-21 LAB — IRON,TIBC AND FERRITIN PANEL
Ferritin: 67 ng/mL (ref 15–150)
Iron Saturation: 40 % (ref 15–55)
Iron: 107 ug/dL (ref 27–159)
Total Iron Binding Capacity: 270 ug/dL (ref 250–450)
UIBC: 163 ug/dL (ref 131–425)

## 2021-12-21 LAB — B12 AND FOLATE PANEL
Folate: >20 ng/mL (ref >3.0)
Vitamin B-12: 384 pg/mL (ref 232–1245)

## 2021-12-21 LAB — VITAMIN D 25 HYDROXY (VIT D DEFICIENCY, FRACTURES): Vit D, 25-Hydroxy: 39.8 ng/mL (ref 30.0–100.0)

## 2021-12-22 ENCOUNTER — Other Ambulatory Visit (HOSPITAL_COMMUNITY): Payer: Self-pay

## 2021-12-22 MED ORDER — MELOXICAM 7.5 MG PO TABS
ORAL_TABLET | ORAL | 1 refills | Status: DC
  Filled 2021-12-22: qty 60, 30d supply, fill #0
  Filled 2022-01-17: qty 60, 30d supply, fill #1

## 2021-12-28 ENCOUNTER — Telehealth (HOSPITAL_COMMUNITY): Payer: 59 | Admitting: Psychiatry

## 2022-01-01 ENCOUNTER — Other Ambulatory Visit (HOSPITAL_COMMUNITY): Payer: Self-pay

## 2022-01-01 ENCOUNTER — Telehealth: Payer: 59 | Admitting: Physician Assistant

## 2022-01-01 DIAGNOSIS — J069 Acute upper respiratory infection, unspecified: Secondary | ICD-10-CM

## 2022-01-01 MED ORDER — IPRATROPIUM BROMIDE 0.03 % NA SOLN: 2.0000 | Freq: Two times a day (BID) | NASAL | 0 refills | Status: DC

## 2022-01-01 MED ORDER — BENZONATATE 100 MG PO CAPS
100.0000 mg | ORAL_CAPSULE | Freq: Three times a day (TID) | ORAL | 0 refills | Status: DC | PRN
Start: 2022-01-01 — End: 2022-03-13

## 2022-01-01 NOTE — Progress Notes (Signed)
E-Visit for Upper Respiratory Infection  ° °We are sorry you are not feeling well.  Here is how we plan to help! ° °Based on what you have shared with me, it looks like you may have a viral upper respiratory infection.  Upper respiratory infections are caused by a large number of viruses; however, rhinovirus is the most common cause.  ° °Symptoms vary from person to person, with common symptoms including sore throat, cough, fatigue or lack of energy and feeling of general discomfort.  A low-grade fever of up to 100.4 may present, but is often uncommon.  Symptoms vary however, and are closely related to a person's age or underlying illnesses.  The most common symptoms associated with an upper respiratory infection are nasal discharge or congestion, cough, sneezing, headache and pressure in the ears and face.  These symptoms usually persist for about 3 to 10 days, but can last up to 2 weeks.  It is important to know that upper respiratory infections do not cause serious illness or complications in most cases.   ° °Upper respiratory infections can be transmitted from person to person, with the most common method of transmission being a person's hands.  The virus is able to live on the skin and can infect other persons for up to 2 hours after direct contact.  Also, these can be transmitted when someone coughs or sneezes; thus, it is important to cover the mouth to reduce this risk.  To keep the spread of the illness at bay, good hand hygiene is very important. ° °This is an infection that is most likely caused by a virus. There are no specific treatments other than to help you with the symptoms until the infection runs its course.  We are sorry you are not feeling well.  Here is how we plan to help! ° ° °For nasal congestion, you may use an oral decongestants such as Mucinex D or if you have glaucoma or high blood pressure use plain Mucinex.  Saline nasal spray or nasal drops can help and can safely be used as often as  needed for congestion.  For your congestion, I have prescribed Ipratropium Bromide nasal spray 0.03% two sprays in each nostril 2-3 times a day ° °If you do not have a history of heart disease, hypertension, diabetes or thyroid disease, prostate/bladder issues or glaucoma, you may also use Sudafed to treat nasal congestion.  It is highly recommended that you consult with a pharmacist or your primary care physician to ensure this medication is safe for you to take.    ° °If you have a cough, you may use cough suppressants such as Delsym and Robitussin.  If you have glaucoma or high blood pressure, you can also use Coricidin HBP.   °For cough I have prescribed for you A prescription cough medication called Tessalon Perles 100 mg. You may take 1-2 capsules every 8 hours as needed for cough ° °If you have a sore or scratchy throat, use a saltwater gargle- ¼ to ½ teaspoon of salt dissolved in a 4-ounce to 8-ounce glass of warm water.  Gargle the solution for approximately 15-30 seconds and then spit.  It is important not to swallow the solution.  You can also use throat lozenges/cough drops and Chloraseptic spray to help with throat pain or discomfort.  Warm or cold liquids can also be helpful in relieving throat pain. ° °For headache, pain or general discomfort, you can use Ibuprofen or Tylenol as directed.   °  Some authorities believe that zinc sprays or the use of Echinacea may shorten the course of your symptoms. ° ° °HOME CARE °Only take medications as instructed by your medical team. °Be sure to drink plenty of fluids. Water is fine as well as fruit juices, sodas and electrolyte beverages. You may want to stay away from caffeine or alcohol. If you are nauseated, try taking small sips of liquids. How do you know if you are getting enough fluid? Your urine should be a pale yellow or almost colorless. °Get rest. °Taking a steamy shower or using a humidifier may help nasal congestion and ease sore throat pain. You can  place a towel over your head and breathe in the steam from hot water coming from a faucet. °Using a saline nasal spray works much the same way. °Cough drops, hard candies and sore throat lozenges may ease your cough. °Avoid close contacts especially the very young and the elderly °Cover your mouth if you cough or sneeze °Always remember to wash your hands.  ° °GET HELP RIGHT AWAY IF: °You develop worsening fever. °If your symptoms do not improve within 10 days °You develop yellow or green discharge from your nose over 3 days. °You have coughing fits °You develop a severe head ache or visual changes. °You develop shortness of breath, difficulty breathing or start having chest pain °Your symptoms persist after you have completed your treatment plan ° °MAKE SURE YOU  °Understand these instructions. °Will watch your condition. °Will get help right away if you are not doing well or get worse. ° °Thank you for choosing an e-visit. ° °Your e-visit answers were reviewed by a board certified advanced clinical practitioner to complete your personal care plan. Depending upon the condition, your plan could have included both over the counter or prescription medications. ° °Please review your pharmacy choice. Make sure the pharmacy is open so you can pick up prescription now. If there is a problem, you may contact your provider through MyChart messaging and have the prescription routed to another pharmacy.  Your safety is important to us. If you have drug allergies check your prescription carefully.  ° °For the next 24 hours you can use MyChart to ask questions about today's visit, request a non-urgent call back, or ask for a work or school excuse. °You will get an email in the next two days asking about your experience. I hope that your e-visit has been valuable and will speed your recovery. ° ° °I provided 5 minutes of non face-to-face time during this encounter for chart review and documentation.  ° °

## 2022-01-05 ENCOUNTER — Encounter: Payer: Self-pay | Admitting: Physical Medicine & Rehabilitation

## 2022-01-05 ENCOUNTER — Other Ambulatory Visit (HOSPITAL_COMMUNITY): Payer: Self-pay

## 2022-01-05 ENCOUNTER — Encounter: Payer: 59 | Attending: Physical Medicine & Rehabilitation | Admitting: Physical Medicine & Rehabilitation

## 2022-01-05 VITALS — BP 116/81 | HR 87 | Temp 98.2°F | Ht 64.0 in | Wt 216.0 lb

## 2022-01-05 DIAGNOSIS — M47817 Spondylosis without myelopathy or radiculopathy, lumbosacral region: Secondary | ICD-10-CM | POA: Diagnosis not present

## 2022-01-05 NOTE — Patient Instructions (Addendum)
Lumbar medial branch blocks were performed. This is to help diagnose the cause of the low back pain. It is important that you keep track of your pain for the first day or 2 after injection. This injection can give you temporary relief that lasts for hours or up to several months. There is no way to predict duration of pain relief.  Please try to compare your pain after injection to for the injection.  If this injection gives you  temporary relief there may be another longer-lasting procedure that may be beneficial call radiofrequency ablation   Call us Monday to let us know amt of pain relief

## 2022-01-05 NOTE — Progress Notes (Signed)
  PROCEDURE RECORD [facility]   Name: Desiree Russell DOB:1970-12-03 MRN: [id number]  Date:01/05/2022  Physician: Alysia Penna, MD    Nurse/CMA: Ronnald Ramp, RMA  Allergies:  Allergies  Allergen Reactions   Codeine Hives and Rash   Morphine Hives and Other (See Comments)    Headaches, also   Morphine And Related Hives   Chlorhexidine Hives and Rash         Consent Signed: Yes.    Is patient diabetic? No.  CBG today? N/A  Pregnant: No. LMP: No LMP recorded. Patient has had a hysterectomy. (age 42-55)  Anticoagulants: yes (Plavix ) Anti-inflammatory: no Antibiotics: no  Procedure: Right L3-4 Medial Branch Block  Position: Prone Start Time: 3:10 pm  End Time: 3:15 pm  Fluoro Time: 26  RN/CMA Parker MA Ronnald Ramp, RMA    Time 2:51 pm 3:21 pm    BP 113/77 116/81    Pulse 89 87    Respirations 14 16    O2 Sat 99 97    S/S 6 6    Pain Level 4/10 0/10     D/C home with Spouse, patient A & O X 3, D/C instructions reviewed, and sits independently.

## 2022-01-05 NOTE — Progress Notes (Signed)
Right lumbar L3, L4 medial branch blocks  under fluoroscopic guidance  Indication: Right Lumbar pain which is not relieved by medication management or other conservative care and interfering with self-care and mobility.  Informed consent was obtained after describing risks and benefits of the procedure with the patient, this includes bleeding, bruising, infection, paralysis and medication side effects. The patient wishes to proceed and has given written consent. The patient was placed in a prone position. The lumbar area was marked and prepped with Betadine. One ML of 1% lidocaine was injected into each of 2 areas into the skin and subcutaneous tissue. Right L5 superior articular process in transverse process junction was targeted. Bone contact was made.Isovue 200 was injected x0.5 mL demonstrating no intravascular uptake. Then a solution containing 2% MPF lidocaine was injected x0.5 mL. Then the Right L4 superior articular process in transverse process junction was targeted. Bone contact was made. Isovue 200 was injected x0.5 mL demonstrating no intravascular uptake. Then a solution containing2% MPF lidocaine was injected x0.5 mL Patient tolerated procedure well. Post procedure instructions were given. Please refer to post procedure form.

## 2022-01-09 ENCOUNTER — Other Ambulatory Visit (HOSPITAL_COMMUNITY): Payer: Self-pay

## 2022-01-11 ENCOUNTER — Encounter: Payer: Self-pay | Admitting: Physical Medicine & Rehabilitation

## 2022-01-16 ENCOUNTER — Encounter: Payer: Self-pay | Admitting: Nurse Practitioner

## 2022-01-17 ENCOUNTER — Other Ambulatory Visit: Payer: Self-pay | Admitting: Nurse Practitioner

## 2022-01-17 ENCOUNTER — Encounter: Payer: Self-pay | Admitting: Nurse Practitioner

## 2022-01-17 ENCOUNTER — Other Ambulatory Visit (HOSPITAL_COMMUNITY): Payer: Self-pay

## 2022-01-17 DIAGNOSIS — J018 Other acute sinusitis: Secondary | ICD-10-CM

## 2022-01-26 ENCOUNTER — Other Ambulatory Visit (HOSPITAL_COMMUNITY): Payer: Self-pay

## 2022-01-26 ENCOUNTER — Ambulatory Visit: Payer: 59 | Admitting: Nurse Practitioner

## 2022-02-05 ENCOUNTER — Other Ambulatory Visit: Payer: Self-pay | Admitting: Nurse Practitioner

## 2022-02-06 ENCOUNTER — Ambulatory Visit (INDEPENDENT_AMBULATORY_CARE_PROVIDER_SITE_OTHER): Payer: 59 | Admitting: Nurse Practitioner

## 2022-02-06 ENCOUNTER — Other Ambulatory Visit (HOSPITAL_COMMUNITY): Payer: Self-pay

## 2022-02-06 ENCOUNTER — Encounter: Payer: Self-pay | Admitting: Nurse Practitioner

## 2022-02-06 VITALS — BP 124/62 | HR 90 | Temp 96.5°F | Ht 64.0 in | Wt 216.0 lb

## 2022-02-06 DIAGNOSIS — Z6837 Body mass index (BMI) 37.0-37.9, adult: Secondary | ICD-10-CM

## 2022-02-06 DIAGNOSIS — Z0000 Encounter for general adult medical examination without abnormal findings: Secondary | ICD-10-CM | POA: Diagnosis not present

## 2022-02-06 DIAGNOSIS — Z23 Encounter for immunization: Secondary | ICD-10-CM | POA: Diagnosis not present

## 2022-02-06 DIAGNOSIS — Z1231 Encounter for screening mammogram for malignant neoplasm of breast: Secondary | ICD-10-CM | POA: Diagnosis not present

## 2022-02-06 NOTE — Progress Notes (Signed)
Subjective:  Patient ID: Desiree Russell, female    DOB: May 30, 1971  Age: 51 y.o. MRN: [id number]  Chief Complaint  Patient presents with   Annual Exam                                                                                                            HPI Encounter for general adult medical examination without abnormal findings  Physical ("At Risk" items are starred): Patient's last physical exam was 1 year ago .      SDOH Screenings   Alcohol Screen: Low Risk  (02/06/2022)   Alcohol Screen    Last Alcohol Screening Score (AUDIT): 0  Depression (PHQ2-9): Low Risk  (01/05/2022)   Depression (PHQ2-9)    PHQ-2 Score: 0  Recent Concern: Depression (PHQ2-9) - Medium Risk (11/16/2021)   Depression (PHQ2-9)    PHQ-2 Score: 5  Financial Resource Strain: Low Risk  (12/20/2021)   Overall Financial Resource Strain (CARDIA)    Difficulty of Paying Living Expenses: Not hard at all  Food Insecurity: No Food Insecurity (12/20/2021)   Hunger Vital Sign    Worried About Running Out of Food in the Last Year: Never true    [place] in the Last Year: Never true  Housing: Low Risk  (12/20/2021)   Housing    Last Housing Risk Score: 0  Physical Activity: Sufficiently Active (12/20/2021)   Exercise Vital Sign    Days of Exercise per Week: 5 days    Minutes of Exercise per Session: 60 min  Social Connections: Socially Integrated (12/20/2021)   Social Connection and Isolation Panel [NHANES]    Frequency of Communication with Friends and Family: More than three times a week    Frequency of Social Gatherings with Friends and Family: More than three times a week    Attends Religious Services: More than 4 times per year    Active Member of Clubs or Organizations: Yes    Attends Archivist Meetings: More than 4 times per year    Marital Status: Married  Stress: No Stress Concern Present  (12/20/2021)   [place]    Feeling of Stress : Not at all  Tobacco Use: Low Risk  (01/05/2022)   Patient History    Smoking Tobacco Use: Never    Smokeless Tobacco Use: Never    Passive Exposure: Not on file  Transportation Needs: No Transportation Needs (12/20/2021)   PRAPARE - Transportation    Lack of Transportation (Medical): No    Lack of Transportation (Non-Medical): No       02/06/2022    8:08 AM 01/05/2022    2:50 PM 12/20/2021    7:39 AM 11/16/2021    2:54 PM 10/13/2021    7:35 AM  [place] in the past year? 1 0 0 0 1  Number falls in past yr: 1 0 0 0 1  Injury with Fall? 0 0 0 0 0  Risk for fall due to : Impaired  balance/gait  No Fall Risks    Follow up Falls evaluation completed  Falls evaluation completed         02/06/2022    8:09 AM 01/05/2022    2:51 PM 11/16/2021    2:54 PM 10/13/2021    7:35 AM 07/14/2021    8:29 AM  Depression screen PHQ 2/9  Decreased Interest 0 0 0 0 0  Down, Depressed, Hopeless 0 0 1 0 0  PHQ - 2 Score 0 0 1 0 0  Altered sleeping   1 3 0  Tired, decreased energy   2  0  Change in appetite   0 1 0  Feeling bad or failure about yourself    0 0 0  Trouble concentrating   1 0 0  Moving slowly or fidgety/restless   0 0 0  Suicidal thoughts   0 0 0  PHQ-9 Score   5 4 0  Difficult doing work/chores     Not difficult at all    Functional Status Survey: Is the patient deaf or have difficulty hearing?: No Does the patient have difficulty seeing, even when wearing glasses/contacts?: No Does the patient have difficulty concentrating, remembering, or making decisions?: No Does the patient have difficulty walking or climbing stairs?: No Does the patient have difficulty dressing or bathing?: No Does the patient have difficulty doing errands alone such as visiting a doctor's office or shopping?: No   Safety: reviewed ;  Patient wears a seat belt. Patient's home has smoke  detectors  Patient practices appropriate gun safety Patient wears sunscreen with extended sun exposure. Dental Care: biannual cleanings, brushes and flosses daily. Ophthalmology/Optometry: Annual visit.  Hearing loss: none Vision impairments: Glasses Patient is not afflicted from Stress Incontinence and Urge Incontinence   Current Outpatient Medications on File Prior to Visit  Medication Sig Dispense Refill   acetaminophen (TYLENOL) 325 MG tablet Take 650 mg by mouth every 6 (six) hours as needed for mild pain, fever or headache.     albuterol (VENTOLIN HFA) 108 (90 Base) MCG/ACT inhaler INHALE 2 PUFFS INTO THE LUNGS EVERY 4 HOURS AS NEEDED FOR WHEEZING. (Patient taking differently: Inhale 2 puffs into the lungs every 4 (four) hours as needed for wheezing.) 8.5 g 0   ALPRAZolam (XANAX) 0.5 MG tablet Take 1 tablet by mouth 2 times daily as needed for anxiety. 60 tablet 2   aspirin 81 MG EC tablet Take 1 tablet (81 mg total) by mouth daily. 21 tablet 0   azelastine (ASTELIN) 0.1 % nasal spray Place 1 spray into both nostrils 2 (two) times daily. Use in each nostril as directed 30 mL 12   benzonatate (TESSALON) 100 MG capsule Take 1 capsule (100 mg total) by mouth 3 (three) times daily as needed. 30 capsule 0   Calcium-Vitamin D-Vitamin K 650-12.5-40 MG-MCG-MCG CHEW Chew 1 tablet by mouth daily.     celecoxib (CELEBREX) 100 MG capsule Take 1 capsule (100 mg total) by mouth 2 (two) times daily. 60 capsule 1   cephALEXin (KEFLEX) 250 MG capsule Take 1 capsule by mouth once daily after relations as directed. (Patient not taking: Reported on 01/05/2022) 30 capsule 3   clopidogrel (PLAVIX) 75 MG tablet Take 1 tablet by mouth once daily 90 tablet 1   cyclobenzaprine (FLEXERIL) 10 MG tablet Take 5-10 mg by mouth at bedtime as needed for muscle spasms.     furosemide (LASIX) 20 MG tablet Take 1 tablet by mouth daily as needed. 90 tablet 3  gabapentin (NEURONTIN) 300 MG capsule Take 3 capsules by mouth at  bedtime. 270 capsule 3   gabapentin (NEURONTIN) 600 MG tablet Take 1 tablet  by mouth at bedtime. 90 tablet 1   Galcanezumab-gnlm (EMGALITY) 120 MG/ML SOAJ Inject '120mg'$  once monthly 3 mL 1   HYDROcodone-acetaminophen (NORCO) 10-325 MG tablet Take 1 tablet by mouth 3 times daily. 90 tablet 0   ipratropium (ATROVENT) 0.03 % nasal spray Place 2 sprays into both nostrils every 12 (twelve) hours. 30 mL 0   loratadine (CLARITIN) 10 MG tablet Take 1 tablet (10 mg total) by mouth daily. 90 tablet 1   meloxicam (MOBIC) 7.5 MG tablet Take 1 tablet by mouth twice daily as needed for pain 60 tablet 1   metoprolol succinate (TOPROL-XL) 25 MG 24 hr tablet Take 1 tablet by mouth daily. 90 tablet 3   Multiple Vitamin (MULTIVITAMIN WITH MINERALS) TABS tablet Take 1 tablet by mouth daily.     OLANZapine-FLUoxetine (SYMBYAX) 3-25 MG capsule Take 1 capsule by mouth every evening. 30 capsule 2   ondansetron (ZOFRAN) 4 MG tablet Take 1 tablet by mouth every 8 hours as needed for nausea or vomiting. 30 tablet 3   oxybutynin (DITROPAN-XL) 5 MG 24 hr tablet Take 1 (one) tablet by mouth at night for overactive bladder/urinary frequency.  Replaces tolterodine. 30 tablet 2   pantoprazole (PROTONIX) 40 MG tablet Take 1 tablet by mouth daily. 90 tablet 1   Semaglutide-Weight Management (WEGOVY) 2.4 MG/0.75ML SOAJ Inject 2.4 mg into the skin once a week. 3 mL 3   senna-docusate (SENOKOT-S) 8.6-50 MG tablet Take 2 tablets by mouth daily.     Ubrogepant (UBRELVY) 100 MG TABS TAKE  1/2 TABLET AT HEADACHE ONSET. MAY REPEAT DOSE IN TWO HOURS IF NO IMPROVEMENT. DO NOT EXCEED MORE THAN TWO TABLETS IN 24 HOURS. (Patient taking differently: Take 50-100 mg by mouth See admin instructions. Take '50mg'$  at headache onset, may repeat dose in two hours if no improvement. Do not excess more than '200mg'$  in 24 hours) 18 tablet 1   vortioxetine HBr (TRINTELLIX) 10 MG TABS tablet Take 1 tablet by mouth daily. 30 tablet 2   No current  facility-administered medications on file prior to visit.    Social Hx   Social History   Socioeconomic History   Marital status: Married    Spouse name: Not on file   Number of children: 4   Years of education: Not on file   Highest education level: Not on file  Occupational History   Not on file  Tobacco Use   Smoking status: Never   Smokeless tobacco: Never  Vaping Use   Vaping Use: Never used  Substance and Sexual Activity   Alcohol use: Yes    Comment: occ    Drug use: No   Sexual activity: Not on file  Other Topics Concern   Not on file  Social History Narrative   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     Social Determinants of Health   Financial Resource Strain: Low Risk  (12/20/2021)   Overall Financial Resource Strain (CARDIA)    Difficulty of Paying Living Expenses: Not hard at all  Food Insecurity: No Food Insecurity (12/20/2021)   Hunger Vital Sign    Worried About Running Out of Food in the Last Year: Never true    Ran Out of Food in the Last Year: Never true  Transportation Needs: No Transportation Needs (12/20/2021)   PRAPARE -  Hydrologist (Medical): No    Lack of Transportation (Non-Medical): No  Physical Activity: Sufficiently Active (12/20/2021)   Exercise Vital Sign    Days of Exercise per Week: 5 days    Minutes of Exercise per Session: 60 min  Stress: No Stress Concern Present (12/20/2021)   [place]    Feeling of Stress : Not at all  Social Connections: [place] (12/20/2021)   Social Connection and Isolation Panel [NHANES]    Frequency of Communication with Friends and Family: More than three times a week    Frequency of Social Gatherings with Friends and Family: More than three times a week    Attends Religious Services: More than 4 times per year    Active Member of [employer] or Organizations: Yes    Attends Programme researcher, broadcasting/film/video: More than 4 times per year    Marital Status: Married   Past Medical History:  Diagnosis Date   Allergy    Anemia    Anxiety    Arthritis    Dyslipidemia    Fibromyalgia    GERD (gastroesophageal reflux disease)    Headache    Migraines    Pericarditis    Stroke ([place]) 12/2020   Tachycardia    TIA (transient ischemic attack)    Recurrent   Family History  Problem Relation Age of Onset   Hypertension Mother    Hypertension Father    Heart attack Father 41   Healthy Sister    Healthy Sister    Healthy Sister    Hypertension Brother    Diabetes type II Brother    COPD Brother    Healthy Brother    Healthy Brother    Breast cancer Maternal Aunt    Throat cancer Maternal Uncle    Hypertension Maternal Grandmother    Diabetes Paternal Geophysicist/field seismologist    Healthy Daughter    Healthy Son    Healthy Son    Healthy Son    Colon cancer Neg Hx    Pancreatic cancer Neg Hx    Stomach cancer Neg Hx    Liver disease Neg Hx    Esophageal cancer Neg Hx    Rectal cancer Neg Hx     Review of Systems  Constitutional:  Negative for chills, fatigue and fever.  HENT:  Negative for congestion, ear pain, rhinorrhea and sore throat.   Respiratory:  Negative for cough and shortness of breath.   Cardiovascular:  Negative for chest pain.  Gastrointestinal:  Negative for abdominal pain, constipation, diarrhea, nausea and vomiting.  Genitourinary:  Negative for dysuria and urgency.  Musculoskeletal:  Positive for arthralgias, back pain and myalgias.  Neurological:  Negative for dizziness, weakness, light-headedness and headaches.  Psychiatric/Behavioral:  Negative for dysphoric mood. The patient is not nervous/anxious.      Objective:  BP 124/62   Pulse 90   Temp (!) 96.5 F (35.8 C)   Ht '5\' 4"'$  (1.626 m)   Wt 216 lb (98 kg)   SpO2 98%   BMI 37.08 kg/m       01/05/2022    3:22 PM 01/05/2022    2:48 PM 12/20/2021    7:38 AM  BP/Weight  Systolic BP 166 063 016   Diastolic BP 81 77 82  Wt. (Lbs)  216 218  BMI  37.08 kg/m2 37.42 kg/m2    Physical Exam Vitals reviewed.  Constitutional:  Appearance: Normal appearance.  HENT:     Head: Normocephalic.     Right Ear: Tympanic membrane normal.     Left Ear: Tympanic membrane normal.     Nose: Nose normal.     Mouth/Throat:     Mouth: Mucous membranes are moist.  Eyes:     Pupils: Pupils are equal, round, and reactive to light.  Cardiovascular:     Rate and Rhythm: Normal rate and regular rhythm.  Pulmonary:     Effort: Pulmonary effort is normal.     Breath sounds: Normal breath sounds.  Abdominal:     General: Bowel sounds are normal.     Palpations: Abdomen is soft.  Musculoskeletal:        General: Normal range of motion.     Cervical back: Neck supple.  Skin:    General: Skin is warm and dry.     Capillary Refill: Capillary refill takes less than 2 seconds.  Neurological:     General: No focal deficit present.     Mental Status: She is alert and oriented to person, place, and time.  Psychiatric:        Mood and Affect: Mood normal.        Behavior: Behavior normal.     Lab Results  Component Value Date   WBC 8.9 10/13/2021   HGB 12.8 10/13/2021   HCT 39.0 10/13/2021   PLT 250 10/13/2021   GLUCOSE 79 10/13/2021   CHOL 211 (H) 10/13/2021   TRIG 93 10/13/2021   HDL 57 10/13/2021   LDLCALC 137 (H) 10/13/2021   ALT 11 10/13/2021   AST 12 10/13/2021   NA 138 10/13/2021   K 4.0 10/13/2021   CL 102 10/13/2021   CREATININE 1.01 (H) 10/13/2021   BUN 11 10/13/2021   CO2 23 10/13/2021   TSH 0.512 07/14/2021   INR 1.1 03/10/2021   HGBA1C 5.5 07/14/2021      Assessment & Plan:    1. Routine medical exam - Tdap vaccine greater than or equal to 7yo IM - MM DIGITAL SCREENING BILATERAL; Future - CBC with Differential/Platelet - Comprehensive metabolic panel - Lipid panel  2. Need for vaccination - Tdap vaccine greater than or equal to 7yo IM  3. Screening  mammogram for breast cancer - MM DIGITAL SCREENING BILATERAL; Future  4. BMI 37.0-37.9, adult - CBC with Differential/Platelet - Comprehensive metabolic panel - Lipid panel   We will call you with labs, mammogram, and back specialist appts Continue medications Tdap booster given in office today, repeat in 10 years Follow-up in 27-month, fasting  These are the goals we discussed:  Goals   Weight loss      This is a list of the screening recommended for you and due dates:  Health Maintenance  Topic Date Due   Tetanus Vaccine  Never done   Mammogram  10/21/2021   Flu Shot  02/27/2022   Colon Cancer Screening  08/10/2028   Hepatitis C Screening: USPSTF Recommendation to screen - Ages 18-79 yo.  Completed   HIV Screening  Completed   HPV Vaccine  Aged Out   COVID-19 Vaccine  Discontinued      AN INDIVIDUALIZED CARE PLAN: was established or reinforced today.   SELF MANAGEMENT: The patient and I together assessed ways to personally work towards obtaining the recommended goals  Support needs The patient and/or family needs were assessed and services were offered and not necessary at this time.    Follow-up: 346-month fasting  Signed, Jerrell [place], [place] [phone]

## 2022-02-06 NOTE — Patient Instructions (Signed)
We will call you with labs, mammogram, and back specialist appts Continue medications Tdap booster given in office today, repeat in 10 years Follow-up in 49-month, fasting  Tdap (Tetanus, Diphtheria, Pertussis) Vaccine: What You Need to Know 1. Why get vaccinated? Tdap vaccine can prevent tetanus, diphtheria, and pertussis. Diphtheria and pertussis spread from person to person. Tetanus enters the body through cuts or wounds. TETANUS (T) causes painful stiffening of the muscles. Tetanus can lead to serious health problems, including being unable to open the mouth, having trouble swallowing and breathing, or death. DIPHTHERIA (D) can lead to difficulty breathing, heart failure, paralysis, or death. PERTUSSIS (aP), also known as "whooping cough," can cause uncontrollable, violent coughing that makes it hard to breathe, eat, or drink. Pertussis can be extremely serious especially in babies and young children, causing pneumonia, convulsions, brain damage, or death. In teens and adults, it can cause weight loss, loss of bladder control, passing out, and rib fractures from severe coughing. 2. Tdap vaccine Tdap is only for children 7 years and older, adolescents, and adults.  Adolescents should receive a single dose of Tdap, preferably at age 49or 49years. Pregnant people should get a dose of Tdap during every pregnancy, 49 third trimester, to help protect the newborn from pertussis. Infants are most at risk for severe, life-threatening complications from pertussis. Adults who have never received Tdap should get a dose of Tdap. Also, adults should receive a booster dose of either Tdap or Td (a different vaccine that protects against tetanus and diphtheria but not pertussis) every 10 years, or after 5 years in the case of a severe or dirty wound or burn. Tdap may be given at the same time as other vaccines. 3. Talk with your health care provider Tell your vaccine  provider if the person getting the vaccine: Has had an allergic reaction after a previous dose of any vaccine that protects against tetanus, diphtheria, or pertussis, or has any severe, life-threatening allergies Has had a coma, decreased level of consciousness, or prolonged seizures within 7 days after a previous dose of any pertussis vaccine (DTP, DTaP, or Tdap) Has seizures or another nervous system problem Has ever had Guillain-Barr Syndrome (also called "GBS") Has had severe pain or swelling after a previous dose of any vaccine that protects against tetanus or diphtheria In some cases, your health care provider may decide to postpone Tdap vaccination until a future visit. People with minor illnesses, such as a cold, may be vaccinated. People who are moderately or severely ill should usually wait until they recover before getting Tdap vaccine.  Your health care provider can give you more information. 4. Risks of a vaccine reaction Pain, redness, or swelling where the shot was given, mild fever, headache, feeling tired, and nausea, vomiting, diarrhea, or stomachache sometimes happen after Tdap vaccination. People sometimes faint after medical procedures, including vaccination. Tell your provider if you feel dizzy or have vision changes or ringing in the ears.  As with any medicine, there is a very remote chance of a vaccine causing a severe allergic reaction, other serious injury, or death. 5. What if there is a serious problem? An allergic reaction could occur after the vaccinated person leaves the clinic. If you see signs of a severe allergic reaction (hives, swelling of the face and throat, difficulty breathing, a fast heartbeat, dizziness, or weakness), call 9-1-1 and get the person to the nearest hospital. For other signs that concern you, call your  health care provider.  Adverse reactions should be reported to the Vaccine Adverse Event Reporting System (VAERS). Your health care provider  will usually file this report, or you can do it yourself. Visit the VAERS website at www.vaers.SamedayNews.es or call 514 310 3581. VAERS is only for reporting reactions, and VAERS staff members do not give medical advice. 6. The National Vaccine Injury Compensation Program The [employer] Vaccine Injury Compensation Program (VICP) is a federal program that was created to compensate people who may have been injured by certain vaccines. Claims regarding alleged injury or death due to vaccination have a time limit for filing, which may be as short as two years. Visit the VICP website at GoldCloset.com.ee or call 579-611-0546 to learn about the program and about filing a claim. 7. How can I learn more? Ask your health care provider. Call your local or state health department. Visit the website of the Food and Drug Administration (FDA) for vaccine package inserts and additional information at TraderRating.uy. Contact the Centers for Disease Control and Prevention (CDC): Call 774-253-7731 (1-800-CDC-INFO) or Visit CDC's website at http://hunter.com/. Source: CDC Vaccine Information Statement Tdap (Tetanus, Diphtheria, Pertussis) Vaccine (03/04/2020) This same material is available at http://www.wolf.info/ for no charge. This information is not intended to replace advice given to you by your health care provider. Make sure you discuss any questions you have with your health care provider. Document Revised: 06/14/2021 Document Reviewed: 04/17/2021 Elsevier Patient Education  Desiree Russell, Female Adopting a healthy lifestyle and getting preventive care are important in promoting health and wellness. Ask your health care provider about: The right schedule for you to have regular tests and exams. Things you can do on your own to prevent diseases and keep yourself healthy. What should I know about diet, weight, and exercise? Eat a healthy  diet  Eat a diet that includes plenty of vegetables, fruits, low-fat dairy products, and lean protein. Do not eat a lot of foods that are high in solid fats, added sugars, or sodium. Maintain a healthy weight Body mass index (BMI) is used to identify weight problems. It estimates body fat based on height and weight. Your health care provider can help determine your BMI and help you achieve or maintain a healthy weight. Get regular exercise Get regular exercise. This is one of the most important things you can do for your health. Most adults should: Exercise for at least 150 minutes each week. The exercise should increase your heart rate and make you sweat (moderate-intensity exercise). Do strengthening exercises at least twice a week. This is in addition to the moderate-intensity exercise. Spend less time sitting. Even light physical activity can be beneficial. Watch cholesterol and blood lipids Have your blood tested for lipids and cholesterol at 49 years of age, then have this test every 5 years. Have your cholesterol levels checked more often if: Your lipid or cholesterol levels are high. You are older than 49 years of age. You are at high risk for heart disease. What should I know about cancer screening? Depending on your health history and family history, you may need to have cancer screening at various ages. This may include screening for: Breast cancer. Cervical cancer. Colorectal cancer. Skin cancer. Lung cancer. What should I know about heart disease, diabetes, and high blood pressure? Blood pressure and heart disease High blood pressure causes heart disease and increases the risk of stroke. This is more likely to develop in people who have high blood pressure readings or  are overweight. Have your blood pressure checked: Every 3-5 years if you are 26-104 years of age. Every year if you are 94 years old or older. Diabetes Have regular diabetes screenings. This checks your  fasting blood sugar level. Have the screening done: Once every three years after age 29 if you are at a normal weight and have a low risk for diabetes. More often and at a younger age if you are overweight or have a high risk for diabetes. What should I know about preventing infection? Hepatitis B If you have a higher risk for hepatitis B, you should be screened for this virus. Talk with your health care provider to find out if you are at risk for hepatitis B infection. Hepatitis C Testing is recommended for: Everyone born from [birth year] through 1963. Anyone with known risk factors for hepatitis C. Sexually transmitted infections (STIs) Get screened for STIs, including gonorrhea and chlamydia, if: You are sexually active and are younger than 49 years of age. You are older than 49 years of age and your health care provider tells you that you are at risk for this type of infection. Your sexual activity has changed since you were last screened, and you are at increased risk for chlamydia or gonorrhea. Ask your health care provider if you are at risk. Ask your health care provider about whether you are at high risk for HIV. Your health care provider may recommend a prescription medicine to help prevent HIV infection. If you choose to take medicine to prevent HIV, you should first get tested for HIV. You should then be tested every 3 months for as long as you are taking the medicine. Pregnancy If you are about to stop having your period (premenopausal) and you may become pregnant, seek counseling before you get pregnant. Take 400 to 800 micrograms (mcg) of folic acid every day if you become pregnant. Ask for birth control (contraception) if you want to prevent pregnancy. Osteoporosis and menopause Osteoporosis is a disease in which the bones lose minerals and strength with aging. This can result in bone fractures. If you are 29 years old or older, or if you are at risk for osteoporosis and fractures, ask  your health care provider if you should: Be screened for bone loss. Take a calcium or vitamin D supplement to lower your risk of fractures. Be given hormone replacement therapy (HRT) to treat symptoms of menopause. Follow these instructions at home: Alcohol use Do not drink alcohol if: Your health care provider tells you not to drink. You are pregnant, may be pregnant, or are planning to become pregnant. If you drink alcohol: Limit how much you have to: 0-1 drink a day. Know how much alcohol is in your drink. In the [place] one drink equals one 12 oz bottle of beer (355 mL), one 5 oz glass of wine (148 mL), or one 1 oz glass of hard liquor (44 mL). Lifestyle Do not use any products that contain nicotine or tobacco. These products include cigarettes, chewing tobacco, and vaping devices, such as e-cigarettes. If you need help quitting, ask your health care provider. Do not use street drugs. Do not share needles. Ask your health care provider for help if you need support or information about quitting drugs. General instructions Schedule regular health, dental, and eye exams. Stay current with your vaccines. Tell your health care provider if: You often feel depressed. You have ever been abused or do not feel safe at home. Summary Adopting a healthy lifestyle  and getting preventive care are important in promoting health and wellness. Follow your health care provider's instructions about healthy diet, exercising, and getting tested or screened for diseases. Follow your health care provider's instructions on monitoring your cholesterol and blood pressure. This information is not intended to replace advice given to you by your health care provider. Make sure you discuss any questions you have with your health care provider. Document Revised: 12/05/2020 Document Reviewed: 12/05/2020 Elsevier Patient Education  Portal.

## 2022-02-07 ENCOUNTER — Other Ambulatory Visit (HOSPITAL_COMMUNITY): Payer: Self-pay

## 2022-02-07 LAB — CBC WITH DIFFERENTIAL/PLATELET
Basophils Absolute: 0 10*3/uL (ref 0.0–0.2)
Basos: 1 %
EOS (ABSOLUTE): 0.1 10*3/uL (ref 0.0–0.4)
Eos: 1 %
Hematocrit: 38.2 % (ref 34.0–46.6)
Hemoglobin: 12.4 g/dL (ref 11.1–15.9)
Immature Grans (Abs): 0 10*3/uL (ref 0.0–0.1)
Immature Granulocytes: 0 %
Lymphocytes Absolute: 2.2 10*3/uL (ref 0.7–3.1)
Lymphs: 45 %
MCH: 29 pg (ref 26.6–33.0)
MCHC: 32.5 g/dL (ref 31.5–35.7)
MCV: 90 fL (ref 79–97)
Monocytes Absolute: 0.4 10*3/uL (ref 0.1–0.9)
Monocytes: 8 %
Neutrophils Absolute: 2.2 10*3/uL (ref 1.4–7.0)
Neutrophils: 45 %
Platelets: 232 10*3/uL (ref 150–450)
RBC: 4.27 x10E6/uL (ref 3.77–5.28)
RDW: 12.9 % (ref 11.7–15.4)
WBC: 4.8 10*3/uL (ref 3.4–10.8)

## 2022-02-07 LAB — LIPID PANEL
Chol/HDL Ratio: 4.2 ratio (ref 0.0–4.4)
Cholesterol, Total: 208 mg/dL — ABNORMAL HIGH (ref 100–199)
HDL: 49 mg/dL (ref >39)
LDL Chol Calc (NIH): 141 mg/dL — ABNORMAL HIGH (ref 0–99)
Triglycerides: 99 mg/dL (ref 0–149)
VLDL Cholesterol Cal: 18 mg/dL (ref 5–40)

## 2022-02-07 LAB — COMPREHENSIVE METABOLIC PANEL
ALT: 12 IU/L (ref 0–32)
AST: 15 IU/L (ref 0–40)
Albumin/Globulin Ratio: 1.7 (ref 1.2–2.2)
Albumin: 4.3 g/dL (ref 3.9–4.9)
Alkaline Phosphatase: 45 IU/L (ref 44–121)
BUN/Creatinine Ratio: 9 (ref 9–23)
BUN: 9 mg/dL (ref 6–24)
Bilirubin Total: 0.3 mg/dL (ref 0.0–1.2)
CO2: 22 mmol/L (ref 20–29)
Calcium: 9.6 mg/dL (ref 8.7–10.2)
Chloride: 103 mmol/L (ref 96–106)
Creatinine, Ser: 0.98 mg/dL (ref 0.57–1.00)
Globulin, Total: 2.6 g/dL (ref 1.5–4.5)
Glucose: 81 mg/dL (ref 70–99)
Potassium: 4.1 mmol/L (ref 3.5–5.2)
Sodium: 138 mmol/L (ref 134–144)
Total Protein: 6.9 g/dL (ref 6.0–8.5)
eGFR: 71 mL/min/{1.73_m2} (ref >59)

## 2022-02-07 LAB — CARDIOVASCULAR RISK ASSESSMENT

## 2022-02-07 MED ORDER — SENNOSIDES-DOCUSATE SODIUM 8.6-50 MG PO TABS
2.0000 | ORAL_TABLET | Freq: Every day | ORAL | 1 refills | Status: DC
  Filled 2022-02-07 – 2022-06-22 (×3): qty 90, 45d supply, fill #0

## 2022-02-08 ENCOUNTER — Other Ambulatory Visit: Payer: Self-pay

## 2022-02-08 ENCOUNTER — Other Ambulatory Visit (HOSPITAL_COMMUNITY): Payer: Self-pay

## 2022-02-08 MED ORDER — ROSUVASTATIN CALCIUM 5 MG PO TABS
5.0000 mg | ORAL_TABLET | Freq: Every day | ORAL | 0 refills | Status: DC
  Filled 2022-02-08: qty 90, 90d supply, fill #0

## 2022-02-19 ENCOUNTER — Encounter: Payer: Self-pay | Admitting: Nurse Practitioner

## 2022-02-21 ENCOUNTER — Other Ambulatory Visit (HOSPITAL_COMMUNITY): Payer: Self-pay

## 2022-02-23 ENCOUNTER — Other Ambulatory Visit (HOSPITAL_COMMUNITY): Payer: Self-pay

## 2022-02-23 MED ORDER — UBROGEPANT 100 MG PO TABS
ORAL_TABLET | ORAL | 1 refills | Status: DC
  Filled 2022-02-23: qty 10, 30d supply, fill #0
  Filled 2022-11-24: qty 10, 30d supply, fill #1

## 2022-02-26 ENCOUNTER — Ambulatory Visit
Admission: RE | Admit: 2022-02-26 | Discharge: 2022-02-26 | Disposition: A | Discharge status: Home / Self Care | Payer: 59 | Source: Ambulatory Visit | Attending: Nurse Practitioner | Admitting: Nurse Practitioner

## 2022-02-26 DIAGNOSIS — Z0000 Encounter for general adult medical examination without abnormal findings: Secondary | ICD-10-CM

## 2022-02-26 DIAGNOSIS — Z1231 Encounter for screening mammogram for malignant neoplasm of breast: Secondary | ICD-10-CM

## 2022-02-27 ENCOUNTER — Other Ambulatory Visit (HOSPITAL_COMMUNITY): Payer: Self-pay

## 2022-02-27 ENCOUNTER — Encounter: Payer: Self-pay | Admitting: Nurse Practitioner

## 2022-02-27 ENCOUNTER — Other Ambulatory Visit: Payer: Self-pay | Admitting: Nurse Practitioner

## 2022-02-27 DIAGNOSIS — Z6838 Body mass index (BMI) 38.0-38.9, adult: Secondary | ICD-10-CM

## 2022-02-27 DIAGNOSIS — E782 Mixed hyperlipidemia: Secondary | ICD-10-CM

## 2022-02-27 MED ORDER — WEGOVY 2.4 MG/0.75ML ~~LOC~~ SOAJ
2.4000 mg | SUBCUTANEOUS | 3 refills | Status: DC
  Filled 2022-02-27 – 2022-03-09 (×2): qty 3, 28d supply, fill #0
  Filled 2022-04-02: qty 3, 28d supply, fill #1
  Filled 2022-05-03 – 2022-05-04 (×2): qty 3, 28d supply, fill #2
  Filled 2022-06-01: qty 3, 28d supply, fill #3

## 2022-02-28 ENCOUNTER — Other Ambulatory Visit (HOSPITAL_COMMUNITY): Payer: Self-pay

## 2022-03-02 ENCOUNTER — Emergency Department (HOSPITAL_COMMUNITY): Payer: 59

## 2022-03-02 ENCOUNTER — Emergency Department (HOSPITAL_COMMUNITY)
Admission: EM | Admit: 2022-03-02 | Discharge: 2022-03-02 | Disposition: A | Discharge status: Home / Self Care | Payer: 59 | Attending: Emergency Medicine | Admitting: Emergency Medicine

## 2022-03-02 DIAGNOSIS — R4182 Altered mental status, unspecified: Secondary | ICD-10-CM | POA: Diagnosis not present

## 2022-03-02 DIAGNOSIS — Z9049 Acquired absence of other specified parts of digestive tract: Secondary | ICD-10-CM | POA: Diagnosis not present

## 2022-03-02 DIAGNOSIS — Z7982 Long term (current) use of aspirin: Secondary | ICD-10-CM | POA: Diagnosis not present

## 2022-03-02 DIAGNOSIS — R109 Unspecified abdominal pain: Secondary | ICD-10-CM | POA: Diagnosis not present

## 2022-03-02 DIAGNOSIS — W19XXXA Unspecified fall, initial encounter: Secondary | ICD-10-CM | POA: Insufficient documentation

## 2022-03-02 DIAGNOSIS — Z79899 Other long term (current) drug therapy: Secondary | ICD-10-CM | POA: Diagnosis not present

## 2022-03-02 DIAGNOSIS — R1084 Generalized abdominal pain: Secondary | ICD-10-CM | POA: Diagnosis not present

## 2022-03-02 DIAGNOSIS — K7689 Other specified diseases of liver: Secondary | ICD-10-CM | POA: Diagnosis not present

## 2022-03-02 DIAGNOSIS — M47812 Spondylosis without myelopathy or radiculopathy, cervical region: Secondary | ICD-10-CM | POA: Diagnosis not present

## 2022-03-02 DIAGNOSIS — R1031 Right lower quadrant pain: Secondary | ICD-10-CM | POA: Diagnosis not present

## 2022-03-02 DIAGNOSIS — R000 Tachycardia, unspecified: Secondary | ICD-10-CM | POA: Diagnosis not present

## 2022-03-02 DIAGNOSIS — R4702 Dysphasia: Secondary | ICD-10-CM | POA: Diagnosis not present

## 2022-03-02 DIAGNOSIS — Z043 Encounter for examination and observation following other accident: Secondary | ICD-10-CM | POA: Diagnosis not present

## 2022-03-02 DIAGNOSIS — R410 Disorientation, unspecified: Secondary | ICD-10-CM | POA: Diagnosis not present

## 2022-03-02 DIAGNOSIS — Z90710 Acquired absence of both cervix and uterus: Secondary | ICD-10-CM | POA: Diagnosis not present

## 2022-03-02 DIAGNOSIS — K429 Umbilical hernia without obstruction or gangrene: Secondary | ICD-10-CM | POA: Diagnosis not present

## 2022-03-02 DIAGNOSIS — R1030 Lower abdominal pain, unspecified: Secondary | ICD-10-CM | POA: Diagnosis not present

## 2022-03-02 LAB — CBC WITH DIFFERENTIAL/PLATELET
Abs Immature Granulocytes: 0.01 10*3/uL (ref 0.00–0.07)
Basophils Absolute: 0 10*3/uL (ref 0.0–0.1)
Basophils Relative: 0 %
Eosinophils Absolute: 0.1 10*3/uL (ref 0.0–0.5)
Eosinophils Relative: 1 %
HCT: 36.1 % (ref 36.0–46.0)
Hemoglobin: 11.9 g/dL — ABNORMAL LOW (ref 12.0–15.0)
Immature Granulocytes: 0 %
Lymphocytes Relative: 44 %
Lymphs Abs: 2.1 10*3/uL (ref 0.7–4.0)
MCH: 29.5 pg (ref 26.0–34.0)
MCHC: 33 g/dL (ref 30.0–36.0)
MCV: 89.4 fL (ref 80.0–100.0)
Monocytes Absolute: 0.4 10*3/uL (ref 0.1–1.0)
Monocytes Relative: 8 %
Neutro Abs: 2.1 10*3/uL (ref 1.7–7.7)
Neutrophils Relative %: 47 %
Platelets: 207 10*3/uL (ref 150–400)
RBC: 4.04 MIL/uL (ref 3.87–5.11)
RDW: 13.4 % (ref 11.5–15.5)
WBC: 4.7 10*3/uL (ref 4.0–10.5)
nRBC: 0 % (ref 0.0–0.2)

## 2022-03-02 LAB — URINALYSIS, ROUTINE W REFLEX MICROSCOPIC
Bilirubin Urine: NEGATIVE
Glucose, UA: NEGATIVE mg/dL
Hgb urine dipstick: NEGATIVE
Ketones, ur: NEGATIVE mg/dL
Leukocytes,Ua: NEGATIVE
Nitrite: NEGATIVE
Protein, ur: NEGATIVE mg/dL
Specific Gravity, Urine: 1.012 (ref 1.005–1.030)
pH: 5 (ref 5.0–8.0)

## 2022-03-02 LAB — COMPREHENSIVE METABOLIC PANEL
ALT: 16 U/L (ref 0–44)
AST: 22 U/L (ref 15–41)
Albumin: 4.1 g/dL (ref 3.5–5.0)
Alkaline Phosphatase: 33 U/L — ABNORMAL LOW (ref 38–126)
Anion gap: 7 (ref 5–15)
BUN: 8 mg/dL (ref 6–20)
CO2: 26 mmol/L (ref 22–32)
Calcium: 9.5 mg/dL (ref 8.9–10.3)
Chloride: 105 mmol/L (ref 98–111)
Creatinine, Ser: 1.01 mg/dL — ABNORMAL HIGH (ref 0.44–1.00)
GFR, Estimated: >60 mL/min (ref >60)
Glucose, Bld: 88 mg/dL (ref 70–99)
Potassium: 4.2 mmol/L (ref 3.5–5.1)
Sodium: 138 mmol/L (ref 135–145)
Total Bilirubin: 0.8 mg/dL (ref 0.3–1.2)
Total Protein: 6.9 g/dL (ref 6.5–8.1)

## 2022-03-02 MED ORDER — ACETAMINOPHEN 325 MG PO TABS
650.0000 mg | ORAL_TABLET | Freq: Once | ORAL | Status: AC
  Administered 2022-03-02: 650 mg via ORAL
  Filled 2022-03-02: qty 2

## 2022-03-02 MED ORDER — SODIUM CHLORIDE 0.9 % IV BOLUS
500.0000 mL | Freq: Once | INTRAVENOUS | Status: AC
  Administered 2022-03-02: 500 mL via INTRAVENOUS

## 2022-03-02 NOTE — ED Notes (Signed)
Patient transported to CT 

## 2022-03-02 NOTE — ED Notes (Signed)
Pt able to ambulate to and from the bathroom without assistance. Pt's husband called and states he is 15 minutes away form the hospital to pickup pt.

## 2022-03-02 NOTE — ED Provider Notes (Signed)
[facility]PART[facility] Provider Note   CSN: [id number] Arrival date & time: 03/02/22  1131     History {Add pertinent medical, surgical, social history, OB history to HPI:1} Chief Complaint  Patient presents with   Altered Mental Status   Fall   Abdominal Pain    Desiree Russell is a 51 y.o. female.  Patient with a history of fibromyalgia, anxiety and stroke.  She had fallen at work and complains of abdomen and right flank pain   Fall Associated symptoms include abdominal pain.  Abdominal Pain      Home Medications Prior to Admission medications   Medication Sig Start Date End Date Taking? Authorizing Provider  acetaminophen (TYLENOL) 325 MG tablet Take 650 mg by mouth every 6 (six) hours as needed for mild pain, fever or headache.    [provider]  albuterol (VENTOLIN HFA) 108 (90 Base) MCG/ACT inhaler INHALE 2 PUFFS INTO THE LUNGS EVERY 4 HOURS AS NEEDED FOR WHEEZING. Patient taking differently: Inhale 2 puffs into the lungs every 4 (four) hours as needed for wheezing. 10/20/20 10/20/21  Fenton Foy, NP  ALPRAZolam Duanne Moron) 0.5 MG tablet Take 1 tablet by mouth 2 times daily as needed for anxiety. 12/07/21 12/07/22  Arfeen, Arlyce Harman, MD  aspirin 81 MG EC tablet Take 1 tablet (81 mg total) by mouth daily. 01/23/21   Mikhail, Velta Addison, DO  azelastine (ASTELIN) 0.1 % nasal spray Place 1 spray into both nostrils 2 (two) times daily. Use in each nostril as directed 04/27/21   Rip Harbour, NP  benzonatate (TESSALON) 100 MG capsule Take 1 capsule (100 mg total) by mouth 3 (three) times daily as needed. 01/01/22   Mar Daring, PA-C  Calcium-Vitamin D-Vitamin K 650-12.5-40 MG-MCG-MCG CHEW Chew 1 tablet by mouth daily.    [provider]  celecoxib (CELEBREX) 100 MG capsule Take 1 capsule (100 mg total) by mouth 2 (two) times daily. 12/06/21   Kirsteins, Luanna Salk, MD  cephALEXin (KEFLEX) 250 MG capsule Take 1 capsule by mouth once  daily after relations as directed. Patient not taking: Reported on 01/05/2022 05/22/21     clopidogrel (PLAVIX) 75 MG tablet Take 1 tablet by mouth once daily 09/07/21     cyclobenzaprine (FLEXERIL) 10 MG tablet Take 5-10 mg by mouth at bedtime as needed for muscle spasms. 02/28/21   [provider]  furosemide (LASIX) 20 MG tablet Take 1 tablet by mouth daily as needed. 03/21/21 10/13/21  Minus Breeding, MD  gabapentin (NEURONTIN) 300 MG capsule Take 3 capsules by mouth at bedtime. 12/20/21   Rip Harbour, NP  gabapentin (NEURONTIN) 600 MG tablet Take 1 tablet  by mouth at bedtime. 10/10/21   Rip Harbour, NP  Galcanezumab-gnlm [facility]) 120 MG/ML SOAJ Inject '120mg'$  once monthly 08/15/21   Rip Harbour, NP  HYDROcodone-acetaminophen (NORCO) 10-325 MG tablet Take 1 tablet by mouth 3 times daily. 08/16/21     ipratropium (ATROVENT) 0.03 % nasal spray Place 2 sprays into both nostrils every 12 (twelve) hours. 01/01/22   Mar Daring, PA-C  loratadine (CLARITIN) 10 MG tablet Take 1 tablet (10 mg total) by mouth daily. 12/07/21   Rip Harbour, NP  meloxicam (MOBIC) 7.5 MG tablet Take 1 tablet by mouth twice daily as needed for pain 12/22/21     metoprolol succinate (TOPROL-XL) 25 MG 24 hr tablet Take 1 tablet by mouth daily. 03/21/21 03/21/22  Minus Breeding, MD  Multiple Vitamin (MULTIVITAMIN WITH MINERALS)  TABS tablet Take 1 tablet by mouth daily.    [provider]  OLANZapine-FLUoxetine (SYMBYAX) 3-25 MG capsule Take 1 capsule by mouth every evening. 12/07/21   Arfeen, Arlyce Harman, MD  ondansetron (ZOFRAN) 4 MG tablet Take 1 tablet by mouth every 8 hours as needed for nausea or vomiting. 08/15/21   Rip Harbour, NP  oxybutynin (DITROPAN-XL) 5 MG 24 hr tablet Take 1 (one) tablet by mouth at night for overactive bladder/urinary frequency.  Replaces tolterodine. 04/28/21     pantoprazole (PROTONIX) 40 MG tablet Take 1 tablet by mouth daily. 09/04/21   Jackquline Denmark, MD   rosuvastatin (CRESTOR) 5 MG tablet Take 1 tablet (5 mg total) by mouth daily. 02/08/22   Rip Harbour, NP  Semaglutide-Weight Management (WEGOVY) 2.4 MG/0.75ML SOAJ Inject 2.4 mg into the skin once a week. 02/27/22   Cox, Elnita Maxwell, MD  senna-docusate (SENOKOT-S) 8.6-50 MG tablet Take 2 tablets by mouth daily. 02/07/22   Rip Harbour, NP  Ubrogepant (UBRELVY) 100 MG TABS TAKE  1/2 TABLET BY MOUTH AT HEADACHE ONSET. MAY REPEAT DOSE IN TWO HOURS IF NO IMPROVEMENT. DO NOT EXCEED MORE THAN TWO TABLETS IN 24 HOURS. 02/23/22     vortioxetine HBr (TRINTELLIX) 10 MG TABS tablet Take 1 tablet by mouth daily. 12/07/21   [name], MD      Allergies    Codeine, Morphine, Morphine and related, and Chlorhexidine    Review of Systems   Review of Systems  Gastrointestinal:  Positive for abdominal pain.    Physical Exam Updated Vital Signs BP 109/69   Pulse 74   Temp 98.9 F (37.2 C) (Oral)   Resp 15   SpO2 97%  Physical Exam  ED Results / Procedures / Treatments   Labs (all labs ordered are listed, but only abnormal results are displayed) Labs Reviewed  CBC WITH DIFFERENTIAL/PLATELET - Abnormal; Notable for the following components:      Result Value   Hemoglobin 11.9 (*)    All other components within normal limits  COMPREHENSIVE METABOLIC PANEL - Abnormal; Notable for the following components:   Creatinine, Ser 1.01 (*)    Alkaline Phosphatase 33 (*)    All other components within normal limits  URINALYSIS, ROUTINE W REFLEX MICROSCOPIC    EKG None  Radiology CT Cervical Spine Wo Contrast  Result Date: 03/02/2022 CLINICAL DATA:  Neck trauma, dangerous injury mechanism. EXAM: CT CERVICAL SPINE WITHOUT CONTRAST TECHNIQUE: Multidetector CT imaging of the cervical spine was performed without intravenous contrast. Multiplanar CT image reconstructions were also generated. RADIATION DOSE REDUCTION: This exam was performed according to the departmental dose-optimization program which  includes automated exposure control, adjustment of the mA and/or kV according to patient size and/or use of iterative reconstruction technique. COMPARISON:  Cervical spine MRI 11/12/2019. CT of the cervical spine 05/23/2013. FINDINGS: Alignment: Reversal of the expected cervical lordosis. Slight C4-C5 grade 1 anterolisthesis. Slight C6-C7 grade 1 retrolisthesis. Skull base and vertebrae: The basion-dental and atlanto-dental intervals are maintained.No evidence of acute fracture to the cervical spine. Congenital nonunion of the posterior arch of C1. Soft tissues and spinal canal: No prevertebral fluid or swelling. No visible canal hematoma. Disc levels: Cervical spondylosis with multilevel disc space narrowing, disc bulges/central disc protrusions, posterior disc osteophytes, endplate spurring and uncovertebral hypertrophy. Disc space narrowing is greatest at C5-C6 (moderate) and C6-C7 (moderately advanced). No appreciable high-grade spinal canal stenosis. Multilevel bony neural foraminal narrowing, greatest on the left at C6-C7. Upper chest: No consolidation  within the imaged lung apices. No visible pneumothorax. IMPRESSION: No evidence of acute fracture to the cervical spine. Nonspecific reversal of the expected cervical lordosis. Slight grade 1 anterolisthesis at C4-C5, and grade 1 retrolisthesis at C6-C7. Cervical spondylosis, as described. Electronically Signed   By: Kellie Simmering D.O.   On: 03/02/2022 13:03   CT ABDOMEN PELVIS WO CONTRAST  Result Date: 03/02/2022 CLINICAL DATA:  11/24/2017 EXAM: CT ABDOMEN AND PELVIS WITHOUT CONTRAST TECHNIQUE: Multidetector CT imaging of the abdomen and pelvis was performed following the standard protocol without IV contrast. RADIATION DOSE REDUCTION: This exam was performed according to the departmental dose-optimization program which includes automated exposure control, adjustment of the mA and/or kV according to patient size and/or use of iterative reconstruction  technique. FINDINGS: Lower chest: Unremarkable. Hepatobiliary: Stable 8 mm hypodensity in the subcapsular dome of the lateral right liver consistent with benign etiology such as a cyst. No followup recommended. Gallbladder surgically absent. No intrahepatic or extrahepatic biliary dilation. Pancreas: No focal mass lesion. No dilatation of the main duct. No intraparenchymal cyst. No peripancreatic edema. Spleen: No splenomegaly. No focal mass lesion. Adrenals/Urinary Tract: No adrenal nodule or mass. Kidneys unremarkable. No evidence for hydroureter. The urinary bladder appears normal for the degree of distention. Stomach/Bowel: Stomach is unremarkable. No gastric wall thickening. No evidence of outlet obstruction. Duodenum is normally positioned as is the ligament of Treitz. No small bowel wall thickening. No small bowel dilatation. The terminal ileum is normal. The appendix is normal. No gross colonic mass. No colonic wall thickening. Vascular/Lymphatic: No abdominal aortic aneurysm. No abdominal aortic atherosclerotic calcification. There is no gastrohepatic or hepatoduodenal ligament lymphadenopathy. No retroperitoneal or mesenteric lymphadenopathy. No pelvic sidewall lymphadenopathy. Reproductive: Uterus surgically absent.  There is no adnexal mass. Other: Trace free fluid noted in the pelvis. Musculoskeletal: Tiny fat containing umbilical hernia evident. No worrisome lytic or sclerotic osseous abnormality. IMPRESSION: 1. No acute findings in the abdomen or pelvis. 2. Trace free fluid in the pelvis. This is probably physiologic in a premenopausal female. Electronically Signed   By: Misty Stanley M.D.   On: 03/02/2022 13:00   DG Chest Port 1 View  Result Date: 03/02/2022 CLINICAL DATA:  fall EXAM: PORTABLE CHEST 1 VIEW COMPARISON:  04/11/21 FINDINGS: The heart size and mediastinal contours are within normal limits. Both lungs are clear. No visible pleural effusions or pneumothorax. No acute osseous abnormality.  IMPRESSION: No active disease. Electronically Signed   By: Margaretha Sheffield M.D.   On: 03/02/2022 12:04    Procedures Procedures  {Document cardiac monitor, telemetry assessment procedure when appropriate:1}  Medications Ordered in ED Medications  acetaminophen (TYLENOL) tablet 650 mg (has no administration in time range)  sodium chloride 0.9 % bolus 500 mL (0 mLs Intravenous Stopped 03/02/22 1335)    ED Course/ Medical Decision Making/ A&P                           Medical Decision Making Amount and/or Complexity of Data Reviewed Labs: ordered. Radiology: ordered.  Risk OTC drugs.   Patient with a fall with right flank contusion.  She will take Tylenol and follow-up with her doctor  {Document critical care time when appropriate:1} {Document review of labs and clinical decision tools ie heart score, Chads2Vasc2 etc:1}  {Document your independent review of radiology images, and any outside records:1} {Document your discussion with family members, caretakers, and with consultants:1} {Document social determinants of health affecting pt's care:1} {Document your decision  making why or why not admission, treatments were needed:1} Final Clinical Impression(s) / ED Diagnoses Final diagnoses:  Fall, initial encounter    Rx / DC Orders ED Discharge Orders     None

## 2022-03-02 NOTE — ED Triage Notes (Signed)
Pt BIB EMS from work for a unwitnessed fall. LKW was 1030. Pt was found down at 1035 by coworkers who report a left sided gaze, N/V and gargled speech. EMS found pt with no gaze, confused with clear speech. Pt is on Plavix.   Pt also complaining of RLQ pain for the past 2 days with hematuria   158/82 CBG 129 98%  89 NSR

## 2022-03-02 NOTE — Discharge Instructions (Signed)
Take Tylenol for pain and follow-up with your family doctor next week for recheck

## 2022-03-03 ENCOUNTER — Other Ambulatory Visit: Payer: Self-pay

## 2022-03-03 ENCOUNTER — Emergency Department (HOSPITAL_COMMUNITY): Payer: 59

## 2022-03-03 ENCOUNTER — Emergency Department (HOSPITAL_COMMUNITY)
Admission: EM | Admit: 2022-03-03 | Discharge: 2022-03-03 | Disposition: A | Discharge status: Home / Self Care | Payer: 59 | Attending: Emergency Medicine | Admitting: Emergency Medicine

## 2022-03-03 ENCOUNTER — Encounter (HOSPITAL_COMMUNITY): Payer: Self-pay | Admitting: Emergency Medicine

## 2022-03-03 DIAGNOSIS — R1031 Right lower quadrant pain: Secondary | ICD-10-CM | POA: Diagnosis not present

## 2022-03-03 DIAGNOSIS — Z7982 Long term (current) use of aspirin: Secondary | ICD-10-CM | POA: Diagnosis not present

## 2022-03-03 DIAGNOSIS — Z7902 Long term (current) use of antithrombotics/antiplatelets: Secondary | ICD-10-CM | POA: Insufficient documentation

## 2022-03-03 DIAGNOSIS — R112 Nausea with vomiting, unspecified: Secondary | ICD-10-CM | POA: Insufficient documentation

## 2022-03-03 DIAGNOSIS — Z8673 Personal history of transient ischemic attack (TIA), and cerebral infarction without residual deficits: Secondary | ICD-10-CM | POA: Insufficient documentation

## 2022-03-03 DIAGNOSIS — R109 Unspecified abdominal pain: Secondary | ICD-10-CM | POA: Diagnosis not present

## 2022-03-03 LAB — CBC WITH DIFFERENTIAL/PLATELET
Abs Immature Granulocytes: 0.01 10*3/uL (ref 0.00–0.07)
Basophils Absolute: 0 10*3/uL (ref 0.0–0.1)
Basophils Relative: 1 %
Eosinophils Absolute: 0 10*3/uL (ref 0.0–0.5)
Eosinophils Relative: 1 %
HCT: 37.4 % (ref 36.0–46.0)
Hemoglobin: 12.2 g/dL (ref 12.0–15.0)
Immature Granulocytes: 0 %
Lymphocytes Relative: 42 %
Lymphs Abs: 1.8 10*3/uL (ref 0.7–4.0)
MCH: 29.6 pg (ref 26.0–34.0)
MCHC: 32.6 g/dL (ref 30.0–36.0)
MCV: 90.8 fL (ref 80.0–100.0)
Monocytes Absolute: 0.3 10*3/uL (ref 0.1–1.0)
Monocytes Relative: 7 %
Neutro Abs: 2.2 10*3/uL (ref 1.7–7.7)
Neutrophils Relative %: 49 %
Platelets: 211 10*3/uL (ref 150–400)
RBC: 4.12 MIL/uL (ref 3.87–5.11)
RDW: 13.7 % (ref 11.5–15.5)
WBC: 4.4 10*3/uL (ref 4.0–10.5)
nRBC: 0 % (ref 0.0–0.2)

## 2022-03-03 LAB — URINALYSIS, ROUTINE W REFLEX MICROSCOPIC
Bacteria, UA: NONE SEEN
Bilirubin Urine: NEGATIVE
Glucose, UA: NEGATIVE mg/dL
Ketones, ur: NEGATIVE mg/dL
Leukocytes,Ua: NEGATIVE
Nitrite: NEGATIVE
Protein, ur: NEGATIVE mg/dL
Specific Gravity, Urine: 1.011 (ref 1.005–1.030)
pH: 8 (ref 5.0–8.0)

## 2022-03-03 LAB — COMPREHENSIVE METABOLIC PANEL
ALT: 16 U/L (ref 0–44)
AST: 21 U/L (ref 15–41)
Albumin: 4.1 g/dL (ref 3.5–5.0)
Alkaline Phosphatase: 34 U/L — ABNORMAL LOW (ref 38–126)
Anion gap: 5 (ref 5–15)
BUN: 11 mg/dL (ref 6–20)
CO2: 25 mmol/L (ref 22–32)
Calcium: 9.5 mg/dL (ref 8.9–10.3)
Chloride: 108 mmol/L (ref 98–111)
Creatinine, Ser: 1.05 mg/dL — ABNORMAL HIGH (ref 0.44–1.00)
GFR, Estimated: >60 mL/min (ref >60)
Glucose, Bld: 89 mg/dL (ref 70–99)
Potassium: 4.6 mmol/L (ref 3.5–5.1)
Sodium: 138 mmol/L (ref 135–145)
Total Bilirubin: 0.8 mg/dL (ref 0.3–1.2)
Total Protein: 7.5 g/dL (ref 6.5–8.1)

## 2022-03-03 LAB — LIPASE, BLOOD: Lipase: 33 U/L (ref 11–51)

## 2022-03-03 LAB — MAGNESIUM: Magnesium: 2.2 mg/dL (ref 1.7–2.4)

## 2022-03-03 MED ORDER — IOHEXOL 300 MG/ML  SOLN
100.0000 mL | Freq: Once | INTRAMUSCULAR | Status: AC | PRN
  Administered 2022-03-03: 100 mL via INTRAVENOUS

## 2022-03-03 MED ORDER — METOCLOPRAMIDE HCL 10 MG PO TABS: 10.0000 mg | ORAL_TABLET | Freq: Three times a day (TID) | ORAL | 0 refills | Status: DC | PRN

## 2022-03-03 MED ORDER — LACTATED RINGERS IV BOLUS
1000.0000 mL | Freq: Once | INTRAVENOUS | Status: AC
  Administered 2022-03-03: 1000 mL via INTRAVENOUS

## 2022-03-03 MED ORDER — DICYCLOMINE HCL 20 MG PO TABS: 20.0000 mg | ORAL_TABLET | Freq: Two times a day (BID) | ORAL | 0 refills | Status: DC | PRN

## 2022-03-03 MED ORDER — METOCLOPRAMIDE HCL 5 MG/ML IJ SOLN
10.0000 mg | Freq: Once | INTRAMUSCULAR | Status: AC
  Administered 2022-03-03: 10 mg via INTRAVENOUS
  Filled 2022-03-03: qty 2

## 2022-03-03 MED ORDER — KETOROLAC TROMETHAMINE 15 MG/ML IJ SOLN
15.0000 mg | Freq: Once | INTRAMUSCULAR | Status: AC
  Administered 2022-03-03: 15 mg via INTRAVENOUS
  Filled 2022-03-03: qty 1

## 2022-03-03 MED ORDER — ACETAMINOPHEN 325 MG PO TABS
650.0000 mg | ORAL_TABLET | Freq: Once | ORAL | Status: AC
Start: 2022-03-03 — End: 2022-03-03
  Administered 2022-03-03: 650 mg via ORAL
  Filled 2022-03-03: qty 2

## 2022-03-03 MED ORDER — DICYCLOMINE HCL 10 MG PO CAPS
10.0000 mg | ORAL_CAPSULE | Freq: Once | ORAL | Status: AC
  Administered 2022-03-03: 10 mg via ORAL
  Filled 2022-03-03: qty 1

## 2022-03-03 MED ORDER — ONDANSETRON HCL 4 MG/2ML IJ SOLN
4.0000 mg | Freq: Once | INTRAMUSCULAR | Status: AC
  Administered 2022-03-03: 4 mg via INTRAVENOUS
  Filled 2022-03-03: qty 2

## 2022-03-03 MED ORDER — OXYCODONE-ACETAMINOPHEN 5-325 MG PO TABS
1.0000 | ORAL_TABLET | Freq: Once | ORAL | Status: AC
  Administered 2022-03-03: 1 via ORAL
  Filled 2022-03-03: qty 1

## 2022-03-03 NOTE — Discharge Instructions (Signed)
Medications were sent to your pharmacy for abdominal cramping and nausea.  Take these as needed.  Follow-up with your primary care doctor to discuss your long-term medications.  Return to the emergency department for any new or worsening symptoms of concern.

## 2022-03-03 NOTE — ED Triage Notes (Signed)
Pt endorses RLQ pain since Thursday that goes around to her back. Reports emesis throughout the night and unable to keep anything down. Reports she was at ER yesterday for a fall and had labs done that were normal.

## 2022-03-03 NOTE — ED Provider Notes (Signed)
[facility] DEPT Provider Note   CSN: [id number] Arrival date & time: 03/03/22  3976     History  Chief Complaint  Patient presents with   Abdominal Pain    Desiree Russell is a 51 y.o. female.   Abdominal Pain Associated symptoms: nausea and vomiting    Patient presents for abdominal pain, nausea, and vomiting.  Medical history includes anxiety, depression, SVT, TIA, HLD, and arthritis.  She was seen in the ED yesterday after an unwitnessed fall.  She was reportedly found by coworkers at that time with strokelike symptoms.  These resolved by the time EMS got there.  She had complaints of right lower quadrant abdominal pain and hematuria.  She underwent CT scan and lab work.  CT scan showed no acute findings.  Urinalysis was negative for infection or hematuria.  Today she reports continued right lower quadrant abdominal pain in addition to new onset of nausea, vomiting, and p.o. intolerance.  She has had a history of hysterectomy with right-sided salpingo-oophorectomy.  She has had cholecystectomy.  She states that she does have Norco at home for chronic back pain.    Home Medications Prior to Admission medications   Medication Sig Start Date End Date Taking? Authorizing Provider  dicyclomine (BENTYL) 20 MG tablet Take 1 tablet (20 mg total) by mouth 2 (two) times daily as needed for spasms. 03/03/22 04/02/22 Yes Godfrey Pick, MD  metoCLOPramide (REGLAN) 10 MG tablet Take 1 tablet (10 mg total) by mouth every 8 (eight) hours as needed for up to 20 doses for nausea. 03/03/22  Yes Godfrey Pick, MD  acetaminophen (TYLENOL) 325 MG tablet Take 650 mg by mouth every 6 (six) hours as needed for mild pain, fever or headache.    [provider]  albuterol (VENTOLIN HFA) 108 (90 Base) MCG/ACT inhaler INHALE 2 PUFFS INTO THE LUNGS EVERY 4 HOURS AS NEEDED FOR WHEEZING. Patient taking differently: Inhale 2 puffs into the lungs every 4 (four) hours as needed for wheezing.  10/20/20 10/20/21  Fenton Foy, NP  ALPRAZolam Duanne Moron) 0.5 MG tablet Take 1 tablet by mouth 2 times daily as needed for anxiety. 12/07/21 12/07/22  Arfeen, Arlyce Harman, MD  aspirin 81 MG EC tablet Take 1 tablet (81 mg total) by mouth daily. 01/23/21   Mikhail, Velta Addison, DO  azelastine (ASTELIN) 0.1 % nasal spray Place 1 spray into both nostrils 2 (two) times daily. Use in each nostril as directed 04/27/21   Rip Harbour, NP  benzonatate (TESSALON) 100 MG capsule Take 1 capsule (100 mg total) by mouth 3 (three) times daily as needed. 01/01/22   Mar Daring, PA-C  Calcium-Vitamin D-Vitamin K 650-12.5-40 MG-MCG-MCG CHEW Chew 1 tablet by mouth daily.    [provider]  celecoxib (CELEBREX) 100 MG capsule Take 1 capsule (100 mg total) by mouth 2 (two) times daily. 12/06/21   Kirsteins, Luanna Salk, MD  cephALEXin (KEFLEX) 250 MG capsule Take 1 capsule by mouth once daily after relations as directed. Patient not taking: Reported on 01/05/2022 05/22/21     clopidogrel (PLAVIX) 75 MG tablet Take 1 tablet by mouth once daily 09/07/21     cyclobenzaprine (FLEXERIL) 10 MG tablet Take 5-10 mg by mouth at bedtime as needed for muscle spasms. 02/28/21   [provider]  furosemide (LASIX) 20 MG tablet Take 1 tablet by mouth daily as needed. 03/21/21 10/13/21  Minus Breeding, MD  gabapentin (NEURONTIN) 300 MG capsule Take 3 capsules by mouth at bedtime.  12/20/21   Rip Harbour, NP  gabapentin (NEURONTIN) 600 MG tablet Take 1 tablet  by mouth at bedtime. 10/10/21   Rip Harbour, NP  Galcanezumab-gnlm [facility]) 120 MG/ML SOAJ Inject '120mg'$  once monthly 08/15/21   Rip Harbour, NP  HYDROcodone-acetaminophen (NORCO) 10-325 MG tablet Take 1 tablet by mouth 3 times daily. 08/16/21     ipratropium (ATROVENT) 0.03 % nasal spray Place 2 sprays into both nostrils every 12 (twelve) hours. 01/01/22   Mar Daring, PA-C  loratadine (CLARITIN) 10 MG tablet Take 1 tablet (10 mg total) by mouth  daily. 12/07/21   Rip Harbour, NP  meloxicam (MOBIC) 7.5 MG tablet Take 1 tablet by mouth twice daily as needed for pain 12/22/21     metoprolol succinate (TOPROL-XL) 25 MG 24 hr tablet Take 1 tablet by mouth daily. 03/21/21 03/21/22  Minus Breeding, MD  Multiple Vitamin (MULTIVITAMIN WITH MINERALS) TABS tablet Take 1 tablet by mouth daily.    [provider]  OLANZapine-FLUoxetine (SYMBYAX) 3-25 MG capsule Take 1 capsule by mouth every evening. 12/07/21   Arfeen, Arlyce Harman, MD  ondansetron (ZOFRAN) 4 MG tablet Take 1 tablet by mouth every 8 hours as needed for nausea or vomiting. 08/15/21   Rip Harbour, NP  oxybutynin (DITROPAN-XL) 5 MG 24 hr tablet Take 1 (one) tablet by mouth at night for overactive bladder/urinary frequency.  Replaces tolterodine. 04/28/21     pantoprazole (PROTONIX) 40 MG tablet Take 1 tablet by mouth daily. 09/04/21   Jackquline Denmark, MD  rosuvastatin (CRESTOR) 5 MG tablet Take 1 tablet (5 mg total) by mouth daily. 02/08/22   Rip Harbour, NP  Semaglutide-Weight Management (WEGOVY) 2.4 MG/0.75ML SOAJ Inject 2.4 mg into the skin once a week. 02/27/22   Cox, Elnita Maxwell, MD  senna-docusate (SENOKOT-S) 8.6-50 MG tablet Take 2 tablets by mouth daily. 02/07/22   Rip Harbour, NP  Ubrogepant (UBRELVY) 100 MG TABS TAKE  1/2 TABLET BY MOUTH AT HEADACHE ONSET. MAY REPEAT DOSE IN TWO HOURS IF NO IMPROVEMENT. DO NOT EXCEED MORE THAN TWO TABLETS IN 24 HOURS. 02/23/22     vortioxetine HBr (TRINTELLIX) 10 MG TABS tablet Take 1 tablet by mouth daily. 12/07/21   [name], MD      Allergies    Codeine, Morphine, Morphine and related, and Chlorhexidine    Review of Systems   Review of Systems  Gastrointestinal:  Positive for abdominal pain, nausea and vomiting.  All other systems reviewed and are negative.   Physical Exam Updated Vital Signs BP (!) 119/90 (BP Location: Right Arm)   Pulse 88   Temp 98.1 F (36.7 C) (Oral)   Resp 18   Ht '5\' 4"'$  (1.626 m)   Wt 95.3 kg    SpO2 100%   BMI 36.05 kg/m  Physical Exam Vitals and nursing note reviewed.  Constitutional:      General: She is not in acute distress.    Appearance: She is well-developed. She is not ill-appearing, toxic-appearing or diaphoretic.  HENT:     Head: Normocephalic and atraumatic.     Mouth/Throat:     Mouth: Mucous membranes are moist.     Pharynx: Oropharynx is clear.  Eyes:     General: No scleral icterus.    Extraocular Movements: Extraocular movements intact.     Conjunctiva/sclera: Conjunctivae normal.  Cardiovascular:     Rate and Rhythm: Normal rate and regular rhythm.     Heart sounds: No murmur heard. Pulmonary:  Effort: Pulmonary effort is normal. No respiratory distress.     Breath sounds: Normal breath sounds. No wheezing or rales.  Abdominal:     Palpations: Abdomen is soft.     Tenderness: There is abdominal tenderness in the right lower quadrant. There is no right CVA tenderness, left CVA tenderness, guarding or rebound.  Musculoskeletal:        General: No swelling.     Cervical back: Neck supple.  Skin:    General: Skin is warm and dry.     Capillary Refill: Capillary refill takes less than 2 seconds.     Coloration: Skin is not cyanotic, jaundiced, mottled or pale.  Neurological:     General: No focal deficit present.     Mental Status: She is alert and oriented to person, place, and time.  Psychiatric:        Mood and Affect: Mood normal.        Behavior: Behavior normal.     ED Results / Procedures / Treatments   Labs (all labs ordered are listed, but only abnormal results are displayed) Labs Reviewed  COMPREHENSIVE METABOLIC PANEL - Abnormal; Notable for the following components:      Result Value   Creatinine, Ser 1.05 (*)    Alkaline Phosphatase 34 (*)    All other components within normal limits  URINALYSIS, ROUTINE W REFLEX MICROSCOPIC - Abnormal; Notable for the following components:   Hgb urine dipstick SMALL (*)    All other  components within normal limits  LIPASE, BLOOD  CBC WITH DIFFERENTIAL/PLATELET  MAGNESIUM    EKG None  Radiology CT ABDOMEN PELVIS W CONTRAST  Result Date: 03/03/2022 CLINICAL DATA:  Nausea/vomiting RLQ abdominal pain (Age >= 14y) EXAM: CT ABDOMEN AND PELVIS WITH CONTRAST TECHNIQUE: Multidetector CT imaging of the abdomen and pelvis was performed using the standard protocol following bolus administration of intravenous contrast. RADIATION DOSE REDUCTION: This exam was performed according to the departmental dose-optimization program which includes automated exposure control, adjustment of the mA and/or kV according to patient size and/or use of iterative reconstruction technique. CONTRAST:  123m OMNIPAQUE IOHEXOL 300 MG/ML  SOLN COMPARISON:  CT dated March 02, 2022 November 24, 2017, January 02, 2017 FINDINGS: Lower chest: No acute abnormality. Hepatobiliary: Status post cholecystectomy. Similar appearance of an 8 mm hypodense mass of the RIGHT hepatic dome, stable since 2019 and consistent with a benign etiology. Portal vein is patent. Unchanged prominence of the common bile duct since 2019. Pancreas: Unremarkable. No pancreatic ductal dilatation or surrounding inflammatory changes. Spleen: Normal in size without focal abnormality. Adrenals/Urinary Tract: Adrenal glands are unremarkable. No hydronephrosis. Kidneys enhance symmetrically. No obstructing nephrolithiasis. Bladder is unremarkable. Stomach/Bowel: No evidence of bowel obstruction. Appendix is normal. There is focal bowel wall thickening of the third portion of the duodenum (series 4, image 68). No bowel obstruction. Vascular/Lymphatic: No significant vascular findings are present. No enlarged abdominal or pelvic lymph nodes. Reproductive: Status post hysterectomy. Similar asymmetrically enlarged appearance of the LEFT ovary dating back to at least 2018. Other: Trace pelvic free fluid. Musculoskeletal: No acute or significant osseous findings.  IMPRESSION: 1. There is focal bowel wall thickening of the third portion of the duodenum. This is nonspecific and could reflect a nonspecific duodenitis. Consider correlation with endoscopy if not previously performed. 2. Normal appendix. Electronically Signed   By: SValentino SaxonM.D.   On: 03/03/2022 11:54   CT Head Wo Contrast  Result Date: 03/02/2022 CLINICAL DATA:  Head trauma, abnormal mental status (  Age 2-64y) EXAM: CT HEAD WITHOUT CONTRAST TECHNIQUE: Contiguous axial images were obtained from the base of the skull through the vertex without intravenous contrast. RADIATION DOSE REDUCTION: This exam was performed according to the departmental dose-optimization program which includes automated exposure control, adjustment of the mA and/or kV according to patient size and/or use of iterative reconstruction technique. COMPARISON:  CT head 03/10/2021. FINDINGS: Brain: No evidence of acute infarction, hemorrhage, hydrocephalus, extra-axial collection or mass lesion/mass effect. Small remote cerebellar infarcts. Vascular: No hyperdense vessel identified. Skull: No acute fracture. Sinuses/Orbits: Clear sinuses.  No acute orbital findings. Other: No mastoid effusions. IMPRESSION: 1. No evidence of acute intracranial abnormality. 2. Small remote cerebellar infarcts. Electronically Signed   By: Margaretha Sheffield M.D.   On: 03/02/2022 13:30   CT Cervical Spine Wo Contrast  Result Date: 03/02/2022 CLINICAL DATA:  Neck trauma, dangerous injury mechanism. EXAM: CT CERVICAL SPINE WITHOUT CONTRAST TECHNIQUE: Multidetector CT imaging of the cervical spine was performed without intravenous contrast. Multiplanar CT image reconstructions were also generated. RADIATION DOSE REDUCTION: This exam was performed according to the departmental dose-optimization program which includes automated exposure control, adjustment of the mA and/or kV according to patient size and/or use of iterative reconstruction technique.  COMPARISON:  Cervical spine MRI 11/12/2019. CT of the cervical spine 05/23/2013. FINDINGS: Alignment: Reversal of the expected cervical lordosis. Slight C4-C5 grade 1 anterolisthesis. Slight C6-C7 grade 1 retrolisthesis. Skull base and vertebrae: The basion-dental and atlanto-dental intervals are maintained.No evidence of acute fracture to the cervical spine. Congenital nonunion of the posterior arch of C1. Soft tissues and spinal canal: No prevertebral fluid or swelling. No visible canal hematoma. Disc levels: Cervical spondylosis with multilevel disc space narrowing, disc bulges/central disc protrusions, posterior disc osteophytes, endplate spurring and uncovertebral hypertrophy. Disc space narrowing is greatest at C5-C6 (moderate) and C6-C7 (moderately advanced). No appreciable high-grade spinal canal stenosis. Multilevel bony neural foraminal narrowing, greatest on the left at C6-C7. Upper chest: No consolidation within the imaged lung apices. No visible pneumothorax. IMPRESSION: No evidence of acute fracture to the cervical spine. Nonspecific reversal of the expected cervical lordosis. Slight grade 1 anterolisthesis at C4-C5, and grade 1 retrolisthesis at C6-C7. Cervical spondylosis, as described. Electronically Signed   By: Kellie Simmering D.O.   On: 03/02/2022 13:03   CT ABDOMEN PELVIS WO CONTRAST  Result Date: 03/02/2022 CLINICAL DATA:  11/24/2017 EXAM: CT ABDOMEN AND PELVIS WITHOUT CONTRAST TECHNIQUE: Multidetector CT imaging of the abdomen and pelvis was performed following the standard protocol without IV contrast. RADIATION DOSE REDUCTION: This exam was performed according to the departmental dose-optimization program which includes automated exposure control, adjustment of the mA and/or kV according to patient size and/or use of iterative reconstruction technique. FINDINGS: Lower chest: Unremarkable. Hepatobiliary: Stable 8 mm hypodensity in the subcapsular dome of the lateral right liver consistent  with benign etiology such as a cyst. No followup recommended. Gallbladder surgically absent. No intrahepatic or extrahepatic biliary dilation. Pancreas: No focal mass lesion. No dilatation of the main duct. No intraparenchymal cyst. No peripancreatic edema. Spleen: No splenomegaly. No focal mass lesion. Adrenals/Urinary Tract: No adrenal nodule or mass. Kidneys unremarkable. No evidence for hydroureter. The urinary bladder appears normal for the degree of distention. Stomach/Bowel: Stomach is unremarkable. No gastric wall thickening. No evidence of outlet obstruction. Duodenum is normally positioned as is the ligament of Treitz. No small bowel wall thickening. No small bowel dilatation. The terminal ileum is normal. The appendix is normal. No gross colonic mass. No colonic wall thickening.  Vascular/Lymphatic: No abdominal aortic aneurysm. No abdominal aortic atherosclerotic calcification. There is no gastrohepatic or hepatoduodenal ligament lymphadenopathy. No retroperitoneal or mesenteric lymphadenopathy. No pelvic sidewall lymphadenopathy. Reproductive: Uterus surgically absent.  There is no adnexal mass. Other: Trace free fluid noted in the pelvis. Musculoskeletal: Tiny fat containing umbilical hernia evident. No worrisome lytic or sclerotic osseous abnormality. IMPRESSION: 1. No acute findings in the abdomen or pelvis. 2. Trace free fluid in the pelvis. This is probably physiologic in a premenopausal female. Electronically Signed   By: Misty Stanley M.D.   On: 03/02/2022 13:00   DG Chest Port 1 View  Result Date: 03/02/2022 CLINICAL DATA:  fall EXAM: PORTABLE CHEST 1 VIEW COMPARISON:  04/11/21 FINDINGS: The heart size and mediastinal contours are within normal limits. Both lungs are clear. No visible pleural effusions or pneumothorax. No acute osseous abnormality. IMPRESSION: No active disease. Electronically Signed   By: Margaretha Sheffield M.D.   On: 03/02/2022 12:04    Procedures Procedures     Medications Ordered in ED Medications  metoCLOPramide (REGLAN) injection 10 mg (10 mg Intravenous Given 03/03/22 1038)  lactated ringers bolus 1,000 mL (0 mLs Intravenous Stopped 03/03/22 1511)  ketorolac (TORADOL) 15 MG/ML injection 15 mg (15 mg Intravenous Given 03/03/22 1038)  iohexol (OMNIPAQUE) 300 MG/ML solution 100 mL (100 mLs Intravenous Contrast Given 03/03/22 1114)  ondansetron (ZOFRAN) injection 4 mg (4 mg Intravenous Given 03/03/22 1327)  acetaminophen (TYLENOL) tablet 650 mg (650 mg Oral Given 03/03/22 1350)  dicyclomine (BENTYL) capsule 10 mg (10 mg Oral Given 03/03/22 1351)  oxyCODONE-acetaminophen (PERCOCET/ROXICET) 5-325 MG per tablet 1 tablet (1 tablet Oral Given 03/03/22 1350)    ED Course/ Medical Decision Making/ A&P                           Medical Decision Making Amount and/or Complexity of Data Reviewed Labs: ordered. Radiology: ordered.  Risk OTC drugs. Prescription drug management.   This patient presents to the ED for concern of abdominal pain, this involves an extensive number of treatment options, and is a complaint that carries with it a high risk of complications and morbidity.  The differential diagnosis includes appendicitis, colitis, constipation, hepatitis, hernia, pyelonephritis   Co morbidities that complicate the patient evaluation  anxiety, depression, SVT, TIA, HLD, and arthritis   Additional history obtained:  Additional history obtained from N/A External records from outside source obtained and reviewed including EMR   Lab Tests:  I Ordered, and personally interpreted labs.  The pertinent results include: Normal hemoglobin, no leukocytosis, normal electrolytes, normal hepatobiliary enzymes, normal lipase, no evidence of UTI   Imaging Studies ordered:  I ordered imaging studies including CT of abdomen pelvis I independently visualized and interpreted imaging which showed possible duodenitis without any other acute findings I agree with  the radiologist interpretation   Cardiac Monitoring: / EKG:  The patient was maintained on a cardiac monitor.  I personally viewed and interpreted the cardiac monitored which showed an underlying rhythm of: Sinus rhythm   Problem List / ED Course / Critical interventions / Medication management  Patient presents for right lower quadrant abdominal pain, nausea, and vomiting.  She has had similar symptoms for the past several days and did undergo an ED work-up yesterday.  She reports that she has had worsened pain and onset of nausea and vomiting since her ED visit yesterday.  On arrival, patient has normal vital signs and is well-appearing on exam.  She  does have right lower quadrant tenderness present.  She does endorse current nausea.  IV fluids were given for her recent p.o. intolerance.  She is given Toradol Zofran for symptomatic relief.  She underwent laboratory work-up, the results which were reassuring.  Yesterday, she underwent a noncontrasted CT scan.  Given her worsening symptoms, contrasted scan was ordered today.  Results showed no acute findings to explain her right lower quadrant pain.  There was some evidence of wall thickening suggestive of duodenitis.  On reassessment, she did have improved symptoms.  She did endorse continued nausea and pain.  She was given Zofran, Percocet, Tylenol, and Bentyl.  Following this, she did have significant relief of symptoms.  She was able to tolerate p.o. intake in the ED.  She states that Bentyl seemed to help and did request a prescription for this at home.  This was provided and patient was discharged in stable condition. I ordered medication including Reglan and Zofran for nausea; Toradol, Percocet, Tylenol, and Bentyl for abdominal pain; IVF for hydration Reevaluation of the patient after these medicines showed that the patient improved I have reviewed the patients home medicines and have made adjustments as needed   Social Determinants of  Health:  Has access to outpatient care        Final Clinical Impression(s) / ED Diagnoses Final diagnoses:  Right lower quadrant abdominal pain  Nausea and vomiting, unspecified vomiting type    Rx / DC Orders ED Discharge Orders          Ordered    metoCLOPramide (REGLAN) 10 MG tablet  Every 8 hours PRN        03/03/22 1443    dicyclomine (BENTYL) 20 MG tablet  2 times daily PRN        03/03/22 1443              Godfrey Pick, MD 03/04/22 [phone]

## 2022-03-05 ENCOUNTER — Telehealth: Payer: Self-pay

## 2022-03-05 NOTE — Progress Notes (Unsigned)
Acute Office Visit  Subjective:    Patient ID: Desiree Russell, female    DOB: 1971/04/06, 51 y.o.   MRN: [id number]  No chief complaint on file.   HPI: Patient is in today for Follow up ER visit  Patient was seen in ER for Abd Pain on 03/03/2022. She was treated for RLQ Abd Pain. Treatment for this included Reglan and Bentyl. She reports {DESC; EXCELLENT/GOOD/FAIR:19992} compliance with treatment. She reports this condition is {improved/worse/unchanged:3041574}.   Past Medical History:  Diagnosis Date   Allergy    Anemia    Anxiety    Arthritis    Dyslipidemia    Fibromyalgia    GERD (gastroesophageal reflux disease)    Headache    Migraines    Pericarditis    Stroke ([place]) 12/2020   Tachycardia    TIA (transient ischemic attack)    Recurrent    Past Surgical History:  Procedure Laterality Date   ABDOMINAL HYSTERECTOMY     CHOLECYSTECTOMY     CHOLECYSTECTOMY, LAPAROSCOPIC     COLONOSCOPY     UPPER GASTROINTESTINAL ENDOSCOPY      Family History  Problem Relation Age of Onset   Hypertension Mother    Hypertension Father    Heart attack Father 26   Healthy Sister    Healthy Sister    Healthy Sister    Hypertension Brother    Diabetes type II Brother    COPD Brother    Healthy Brother    Healthy Brother    Breast cancer Maternal Aunt    Throat cancer Maternal Uncle    Hypertension Maternal Grandmother    Diabetes Paternal Geophysicist/field seismologist    Healthy Daughter    Healthy Son    Healthy Son    Healthy Son    Colon cancer Neg Hx    Pancreatic cancer Neg Hx    Stomach cancer Neg Hx    Liver disease Neg Hx    Esophageal cancer Neg Hx    Rectal cancer Neg Hx     Social History   Socioeconomic History   Marital status: Married    Spouse name: Not on file   Number of children: 4   Years of education: Not on file   Highest education level: Not on file  Occupational History   Not on file  Tobacco Use   Smoking status: Never   Smokeless tobacco: Never   Vaping Use   Vaping Use: Never used  Substance and Sexual Activity   Alcohol use: Yes    Comment: occ    Drug use: No   Sexual activity: Not on file  Other Topics Concern   Not on file  Social History Narrative   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     Social Determinants of Health   Financial Resource Strain: Low Risk  (12/20/2021)   Overall Financial Resource Strain (CARDIA)    Difficulty of Paying Living Expenses: Not hard at all  Food Insecurity: No Food Insecurity (12/20/2021)   Hunger Vital Sign    Worried About Running Out of Food in the Last Year: Never true    Ran Out of Food in the Last Year: Never true  Transportation Needs: No Transportation Needs (12/20/2021)   PRAPARE - Hydrologist (Medical): No    Lack of Transportation (Non-Medical): No  Physical Activity: Sufficiently Active (12/20/2021)   Exercise Vital Sign    Days of Exercise per Week: 5  days    Minutes of Exercise per Session: 60 min  Stress: No Stress Concern Present (12/20/2021)   [place]    Feeling of Stress : Not at all  Social Connections: [place] (12/20/2021)   Social Connection and Isolation Panel [NHANES]    Frequency of Communication with Friends and Family: More than three times a week    Frequency of Social Gatherings with Friends and Family: More than three times a week    Attends Religious Services: More than 4 times per year    Active Member of [employer] or Organizations: Yes    Attends Music therapist: More than 4 times per year    Marital Status: Married  Human resources officer Violence: Not At Risk (12/20/2021)   Humiliation, Afraid, Rape, and Kick questionnaire    Fear of Current or Ex-Partner: No    Emotionally Abused: No    Physically Abused: No    Sexually Abused: No    Outpatient Medications Prior to Visit  Medication Sig Dispense Refill    acetaminophen (TYLENOL) 325 MG tablet Take 650 mg by mouth every 6 (six) hours as needed for mild pain, fever or headache.     albuterol (VENTOLIN HFA) 108 (90 Base) MCG/ACT inhaler INHALE 2 PUFFS INTO THE LUNGS EVERY 4 HOURS AS NEEDED FOR WHEEZING. (Patient taking differently: Inhale 2 puffs into the lungs every 4 (four) hours as needed for wheezing.) 8.5 g 0   ALPRAZolam (XANAX) 0.5 MG tablet Take 1 tablet by mouth 2 times daily as needed for anxiety. 60 tablet 2   aspirin 81 MG EC tablet Take 1 tablet (81 mg total) by mouth daily. 21 tablet 0   azelastine (ASTELIN) 0.1 % nasal spray Place 1 spray into both nostrils 2 (two) times daily. Use in each nostril as directed 30 mL 12   benzonatate (TESSALON) 100 MG capsule Take 1 capsule (100 mg total) by mouth 3 (three) times daily as needed. 30 capsule 0   Calcium-Vitamin D-Vitamin K 650-12.5-40 MG-MCG-MCG CHEW Chew 1 tablet by mouth daily.     celecoxib (CELEBREX) 100 MG capsule Take 1 capsule (100 mg total) by mouth 2 (two) times daily. 60 capsule 1   cephALEXin (KEFLEX) 250 MG capsule Take 1 capsule by mouth once daily after relations as directed. (Patient not taking: Reported on 01/05/2022) 30 capsule 3   clopidogrel (PLAVIX) 75 MG tablet Take 1 tablet by mouth once daily 90 tablet 1   cyclobenzaprine (FLEXERIL) 10 MG tablet Take 5-10 mg by mouth at bedtime as needed for muscle spasms.     dicyclomine (BENTYL) 20 MG tablet Take 1 tablet (20 mg total) by mouth 2 (two) times daily as needed for spasms. 60 tablet 0   furosemide (LASIX) 20 MG tablet Take 1 tablet by mouth daily as needed. 90 tablet 3   gabapentin (NEURONTIN) 300 MG capsule Take 3 capsules by mouth at bedtime. 270 capsule 3   gabapentin (NEURONTIN) 600 MG tablet Take 1 tablet  by mouth at bedtime. 90 tablet 1   Galcanezumab-gnlm (EMGALITY) 120 MG/ML SOAJ Inject 131m once monthly 3 mL 1   HYDROcodone-acetaminophen (NORCO) 10-325 MG tablet Take 1 tablet by mouth 3 times daily. 90 tablet 0    ipratropium (ATROVENT) 0.03 % nasal spray Place 2 sprays into both nostrils every 12 (twelve) hours. 30 mL 0   loratadine (CLARITIN) 10 MG tablet Take 1 tablet (10 mg total) by mouth daily. 90 tablet  1   meloxicam (MOBIC) 7.5 MG tablet Take 1 tablet by mouth twice daily as needed for pain 60 tablet 1   metoCLOPramide (REGLAN) 10 MG tablet Take 1 tablet (10 mg total) by mouth every 8 (eight) hours as needed for up to 20 doses for nausea. 20 tablet 0   metoprolol succinate (TOPROL-XL) 25 MG 24 hr tablet Take 1 tablet by mouth daily. 90 tablet 3   Multiple Vitamin (MULTIVITAMIN WITH MINERALS) TABS tablet Take 1 tablet by mouth daily.     OLANZapine-FLUoxetine (SYMBYAX) 3-25 MG capsule Take 1 capsule by mouth every evening. 30 capsule 2   ondansetron (ZOFRAN) 4 MG tablet Take 1 tablet by mouth every 8 hours as needed for nausea or vomiting. 30 tablet 3   oxybutynin (DITROPAN-XL) 5 MG 24 hr tablet Take 1 (one) tablet by mouth at night for overactive bladder/urinary frequency.  Replaces tolterodine. 30 tablet 2   pantoprazole (PROTONIX) 40 MG tablet Take 1 tablet by mouth daily. 90 tablet 1   rosuvastatin (CRESTOR) 5 MG tablet Take 1 tablet (5 mg total) by mouth daily. 90 tablet 0   Semaglutide-Weight Management (WEGOVY) 2.4 MG/0.75ML SOAJ Inject 2.4 mg into the skin once a week. 3 mL 3   senna-docusate (SENOKOT-S) 8.6-50 MG tablet Take 2 tablets by mouth daily. 90 tablet 1   Ubrogepant (UBRELVY) 100 MG TABS TAKE  1/2 TABLET BY MOUTH AT HEADACHE ONSET. MAY REPEAT DOSE IN TWO HOURS IF NO IMPROVEMENT. DO NOT EXCEED MORE THAN TWO TABLETS IN 24 HOURS. 18 tablet 1   vortioxetine HBr (TRINTELLIX) 10 MG TABS tablet Take 1 tablet by mouth daily. 30 tablet 2   No facility-administered medications prior to visit.    Allergies  Allergen Reactions   Codeine Hives and Rash   Morphine Hives and Other (See Comments)    Headaches, also   Morphine And Related Hives   Chlorhexidine Hives and Rash          Review of Systems     Objective:    Physical Exam  There were no vitals taken for this visit. Wt Readings from Last 3 Encounters:  03/03/22 210 lb (95.3 kg)  02/06/22 216 lb (98 kg)  01/05/22 216 lb (98 kg)    Health Maintenance Due  Topic Date Due   INFLUENZA VACCINE  02/27/2022    There are no preventive care reminders to display for this patient.   Lab Results  Component Value Date   TSH 0.512 07/14/2021   Lab Results  Component Value Date   WBC 4.4 03/03/2022   HGB 12.2 03/03/2022   HCT 37.4 03/03/2022   MCV 90.8 03/03/2022   PLT 211 03/03/2022   Lab Results  Component Value Date   NA 138 03/03/2022   K 4.6 03/03/2022   CO2 25 03/03/2022   GLUCOSE 89 03/03/2022   BUN 11 03/03/2022   CREATININE 1.05 (H) 03/03/2022   BILITOT 0.8 03/03/2022   ALKPHOS 34 (L) 03/03/2022   AST 21 03/03/2022   ALT 16 03/03/2022   PROT 7.5 03/03/2022   ALBUMIN 4.1 03/03/2022   CALCIUM 9.5 03/03/2022   ANIONGAP 5 03/03/2022   EGFR 71 02/06/2022   Lab Results  Component Value Date   CHOL 208 (H) 02/06/2022   Lab Results  Component Value Date   HDL 49 02/06/2022   Lab Results  Component Value Date   LDLCALC 141 (H) 02/06/2022   Lab Results  Component Value Date   TRIG 99 02/06/2022  Lab Results  Component Value Date   CHOLHDL 4.2 02/06/2022   Lab Results  Component Value Date   HGBA1C 5.5 07/14/2021       Assessment & Plan:   Problem List Items Addressed This Visit   None  No orders of the defined types were placed in this encounter.   No orders of the defined types were placed in this encounter.    Follow-up: No follow-ups on file.  An After Visit Summary was printed and given to the patient.  Rip Harbour, NP [place] [phone]

## 2022-03-05 NOTE — Telephone Encounter (Signed)
Desiree Russell called to report that she was seen in the ED on Friday and Saturday for abdominal pain.  It looks like she has a through work-up with labs and CT.  She continues to complain of pain and nausea and wants to be seen for follow-up.  She reports no fever or chills but complaining of persistent pain.

## 2022-03-07 ENCOUNTER — Other Ambulatory Visit (HOSPITAL_COMMUNITY): Payer: Self-pay

## 2022-03-08 ENCOUNTER — Ambulatory Visit (INDEPENDENT_AMBULATORY_CARE_PROVIDER_SITE_OTHER): Payer: 59 | Admitting: Nurse Practitioner

## 2022-03-08 ENCOUNTER — Telehealth (HOSPITAL_COMMUNITY): Payer: 59 | Admitting: Psychiatry

## 2022-03-08 ENCOUNTER — Encounter: Payer: Self-pay | Admitting: Nurse Practitioner

## 2022-03-08 VITALS — BP 98/72 | HR 74 | Resp 18 | Ht 64.0 in | Wt 211.8 lb

## 2022-03-08 DIAGNOSIS — Z8619 Personal history of other infectious and parasitic diseases: Secondary | ICD-10-CM

## 2022-03-08 DIAGNOSIS — K5792 Diverticulitis of intestine, part unspecified, without perforation or abscess without bleeding: Secondary | ICD-10-CM | POA: Diagnosis not present

## 2022-03-08 DIAGNOSIS — Z8719 Personal history of other diseases of the digestive system: Secondary | ICD-10-CM

## 2022-03-08 MED ORDER — AMOXICILLIN-POT CLAVULANATE 875-125 MG PO TABS: 1.0000 | ORAL_TABLET | Freq: Two times a day (BID) | ORAL | 0 refills | Status: DC

## 2022-03-08 MED ORDER — FLUCONAZOLE 150 MG PO TABS: 150.0000 mg | ORAL_TABLET | Freq: Once | ORAL | 1 refills | Status: AC

## 2022-03-08 NOTE — Patient Instructions (Addendum)
Rest bowels Clear liquid diet (water, Gatorade, popsicles, gelatin, broth) Then advance to full liquids (creamy soups, pudding, milkshakes, etc)  Then advance to bland diet (dry toast, rice, crackers, bananas) Follow-up with GI as scheduled Take Augmentin twice daily for 7 days Follow-up as needed   Diverticulitis  Diverticulitis is when small pouches in your colon (large intestine) get infected or swollen. This causes pain in the belly (abdomen) and watery poop (diarrhea). These pouches are called diverticula. The pouches form in people who have a condition called diverticulosis. What are the causes? This condition may be caused by poop (stool) that gets trapped in the pouches in your colon. The poop lets germs (bacteria) grow in the pouches. This causes the infection. What increases the risk? You are more likely to get this condition if you have small pouches in your colon. The risk is higher if: You are overweight or very overweight (obese). You do not exercise enough. You drink alcohol. You smoke or use products with tobacco in them. You eat a diet that has a lot of red meat such as beef, pork, or lamb. You eat a diet that does not have enough fiber in it. You are older than 49 years of age. What are the signs or symptoms? Pain in the belly. Pain is often on the left side, but it may be in other areas. Fever and feeling cold. Feeling like you may vomit. Vomiting. Having cramps. Feeling full. Changes to how often you poop. Blood in your poop. How is this treated? Most cases are treated at home by: Taking over-the-counter pain medicines. Following a clear liquid diet. Taking antibiotic medicines. Resting. Very bad cases may need to be treated at a hospital. This may include: Not eating or drinking. Taking prescription pain medicine. Getting antibiotic medicines through an IV tube. Getting fluid and food through an IV tube. Having surgery. When you are feeling better,  your doctor may tell you to have a test to check your colon (colonoscopy). Follow these instructions at home: Medicines Take over-the-counter and prescription medicines only as told by your doctor. These include: Antibiotics. Pain medicines. Fiber pills. Probiotics. Stool softeners. If you were prescribed an antibiotic medicine, take it as told by your doctor. Do not stop taking the antibiotic even if you start to feel better. Ask your doctor if the medicine prescribed to you requires you to avoid driving or using machinery. Eating and drinking  Follow a diet as told by your doctor. When you feel better, your doctor may tell you to change your diet. You may need to eat a lot of fiber. Fiber makes it easier to poop (have a bowel movement). Foods with fiber include: Berries. Beans. Lentils. Green vegetables. Avoid eating red meat. General instructions Do not use any products that contain nicotine or tobacco, such as cigarettes, e-cigarettes, and chewing tobacco. If you need help quitting, ask your doctor. Exercise 3 or more times a week. Try to get 30 minutes each time. Exercise enough to sweat and make your heart beat faster. Keep all follow-up visits as told by your doctor. This is important. Contact a doctor if: Your pain does not get better. You are not pooping like normal. Get help right away if: Your pain gets worse. Your symptoms do not get better. Your symptoms get worse very fast. You have a fever. You vomit more than one time. You have poop that is: Bloody. Black. Tarry. Summary This condition happens when small pouches in your colon get infected  or swollen. Take medicines only as told by your doctor. Follow a diet as told by your doctor. Keep all follow-up visits as told by your doctor. This is important. This information is not intended to replace advice given to you by your health care provider. Make sure you discuss any questions you have with your health care  provider. Document Revised: 04/27/2019 Document Reviewed: 04/27/2019 Elsevier Patient Education  [place].

## 2022-03-09 ENCOUNTER — Other Ambulatory Visit: Payer: Self-pay | Admitting: Cardiology

## 2022-03-09 ENCOUNTER — Other Ambulatory Visit (HOSPITAL_COMMUNITY): Payer: Self-pay

## 2022-03-09 ENCOUNTER — Other Ambulatory Visit: Payer: Self-pay | Admitting: Gastroenterology

## 2022-03-09 DIAGNOSIS — K2100 Gastro-esophageal reflux disease with esophagitis, without bleeding: Secondary | ICD-10-CM

## 2022-03-09 MED ORDER — METOPROLOL SUCCINATE ER 25 MG PO TB24
25.0000 mg | ORAL_TABLET | Freq: Every day | ORAL | 3 refills | Status: DC
  Filled 2022-03-09: qty 90, 90d supply, fill #0
  Filled 2022-06-01: qty 90, 90d supply, fill #1
  Filled 2022-09-26: qty 90, 90d supply, fill #2
  Filled 2022-12-26: qty 90, 90d supply, fill #3

## 2022-03-09 MED ORDER — PANTOPRAZOLE SODIUM 40 MG PO TBEC
40.0000 mg | DELAYED_RELEASE_TABLET | Freq: Every day | ORAL | 1 refills | Status: DC
  Filled 2022-03-09: qty 90, 90d supply, fill #0
  Filled 2022-06-01: qty 90, 90d supply, fill #1

## 2022-03-13 ENCOUNTER — Encounter: Payer: Self-pay | Admitting: Physician Assistant

## 2022-03-13 ENCOUNTER — Ambulatory Visit (INDEPENDENT_AMBULATORY_CARE_PROVIDER_SITE_OTHER): Payer: 59 | Admitting: Physician Assistant

## 2022-03-13 VITALS — BP 100/68 | HR 78 | Temp 97.5°F | Ht 64.0 in | Wt 211.0 lb

## 2022-03-13 DIAGNOSIS — I6381 Other cerebral infarction due to occlusion or stenosis of small artery: Secondary | ICD-10-CM | POA: Diagnosis not present

## 2022-03-13 DIAGNOSIS — I639 Cerebral infarction, unspecified: Secondary | ICD-10-CM | POA: Diagnosis not present

## 2022-03-13 DIAGNOSIS — Z5321 Procedure and treatment not carried out due to patient leaving prior to being seen by health care provider: Secondary | ICD-10-CM | POA: Diagnosis not present

## 2022-03-13 DIAGNOSIS — R404 Transient alteration of awareness: Secondary | ICD-10-CM | POA: Diagnosis not present

## 2022-03-13 DIAGNOSIS — S0990XA Unspecified injury of head, initial encounter: Secondary | ICD-10-CM | POA: Diagnosis not present

## 2022-03-13 NOTE — Progress Notes (Signed)
Acute Office Visit  Subjective:    Patient ID: Desiree Russell, female    DOB: 1971-06-30, 51 y.o.   MRN: [id number]  Chief Complaint  Patient presents with   Confusion/Headache    Has fell and hit head twice in over 1 weeks time.    HPI: Patient came in today with husband and came into office without appointment.  He is concerned because patient has had 2 unwitnessed falls in the past 2 weeks.  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  She hit her head on the wall.  She was evaluated by ED and CT of head was normal.  Yesterday pt states she was at work in supply room and fell hitting her head on a shelf.  EMS was called and pt refused to go to ED.  Husband states she woke up screaming this morning with a headache and he took her to [facility] but became aggravated while waiting and left there and showed up in our office. Initially pt is responding to nurses and myself but hesitates with questioning and not able to answer question of her date of birth.  She did know her name and where she was.  Patient's husband states that she has been having intermittent spells similar to this episode over the past few years. He describes where for days she does well and then has spells where she has mental status changes (personality, memory etc) and then she can be fine for awhile - he states after COVID she actually was doing better and was able to go back to work at [employer] but then starting again last week after her initial fall she has been acting aloof at times  Pt with prior history of lacunar infarcts in bilateral cerebellar hemispheres and right frontal lobe - she is taking aspirin as well as plavix - her last MRI was 04/12/21 which were unchanged from MRI of brain 03/10/21 When nurse and I first began questioning patient she stared off into space, talked about dogs and bats in the room, and did not answer questions appropriately.  She was also randomly  reaching out to grab objects.  As I would observe her she would then change that behavior and able to answer questions and freely gave information about her last follow up with neurology (named the doctor and that she would like to have a different referral to someone new because she was not happy with her care) - she was also able to answer questions about when her last imaging was done, etc She also was able to completely go through and verify her list of medications  Past Medical History:  Diagnosis Date   Allergy    Anemia    Anxiety    Arthritis    Dyslipidemia    Fibromyalgia    GERD (gastroesophageal reflux disease)    Headache    Migraines    Pericarditis    Stroke ([place]) 12/2020   Tachycardia    TIA (transient ischemic attack)    Recurrent    Past Surgical History:  Procedure Laterality Date   ABDOMINAL HYSTERECTOMY     CHOLECYSTECTOMY     CHOLECYSTECTOMY, LAPAROSCOPIC     COLONOSCOPY     UPPER GASTROINTESTINAL ENDOSCOPY      Family History  Problem Relation Age of Onset   Hypertension Mother    Hypertension Father    Heart attack Father 61   Healthy Sister  Healthy Sister    Healthy Sister    Hypertension Brother    Diabetes type II Brother    COPD Brother    Healthy Brother    Healthy Brother    Breast cancer Maternal Aunt    Throat cancer Maternal Uncle    Hypertension Maternal Grandmother    Diabetes Paternal Geophysicist/field seismologist    Healthy Daughter    Healthy Son    Healthy Son    Healthy Son    Colon cancer Neg Hx    Pancreatic cancer Neg Hx    Stomach cancer Neg Hx    Liver disease Neg Hx    Esophageal cancer Neg Hx    Rectal cancer Neg Hx     Social History   Socioeconomic History   Marital status: Married    Spouse name: Not on file   Number of children: 4   Years of education: Not on file   Highest education level: Not on file  Occupational History   Not on file  Tobacco Use   Smoking status: Never   Smokeless tobacco: Never  Vaping  Use   Vaping Use: Never used  Substance and Sexual Activity   Alcohol use: Yes    Comment: occ    Drug use: No   Sexual activity: Not on file  Other Topics Concern   Not on file  Social History Narrative   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     Social Determinants of Health   Financial Resource Strain: Low Risk  (12/20/2021)   Overall Financial Resource Strain (CARDIA)    Difficulty of Paying Living Expenses: Not hard at all  Food Insecurity: No Food Insecurity (12/20/2021)   Hunger Vital Sign    Worried About Running Out of Food in the Last Year: Never true    Ran Out of Food in the Last Year: Never true  Transportation Needs: No Transportation Needs (12/20/2021)   PRAPARE - Hydrologist (Medical): No    Lack of Transportation (Non-Medical): No  Physical Activity: Sufficiently Active (12/20/2021)   Exercise Vital Sign    Days of Exercise per Week: 5 days    Minutes of Exercise per Session: 60 min  Stress: No Stress Concern Present (12/20/2021)   [place]    Feeling of Stress : Not at all  Social Connections: [place] (12/20/2021)   Social Connection and Isolation Panel [NHANES]    Frequency of Communication with Friends and Family: More than three times a week    Frequency of Social Gatherings with Friends and Family: More than three times a week    Attends Religious Services: More than 4 times per year    Active Member of [employer] or Organizations: Yes    Attends Music therapist: More than 4 times per year    Marital Status: Married  Human resources officer Violence: Not At Risk (12/20/2021)   Humiliation, Afraid, Rape, and Kick questionnaire    Fear of Current or Ex-Partner: No    Emotionally Abused: No    Physically Abused: No    Sexually Abused: No    Outpatient Medications Prior to Visit  Medication Sig Dispense Refill   acetaminophen  (TYLENOL) 325 MG tablet Take 650 mg by mouth every 6 (six) hours as needed for mild pain, fever or headache.     ALPRAZolam (XANAX) 0.5 MG tablet Take 1 tablet by mouth 2  times daily as needed for anxiety. 60 tablet 2   amoxicillin-clavulanate (AUGMENTIN) 875-125 MG tablet Take 1 tablet by mouth 2 (two) times daily. 14 tablet 0   aspirin 81 MG EC tablet Take 1 tablet (81 mg total) by mouth daily. 21 tablet 0   Calcium-Vitamin D-Vitamin K 650-12.5-40 MG-MCG-MCG CHEW Chew 1 tablet by mouth daily.     clopidogrel (PLAVIX) 75 MG tablet Take 1 tablet by mouth once daily 90 tablet 1   cyclobenzaprine (FLEXERIL) 10 MG tablet Take 5-10 mg by mouth at bedtime as needed for muscle spasms.     dicyclomine (BENTYL) 20 MG tablet Take 1 tablet (20 mg total) by mouth 2 (two) times daily as needed for spasms. 60 tablet 0   gabapentin (NEURONTIN) 300 MG capsule Take 3 capsules by mouth at bedtime. 270 capsule 3   gabapentin (NEURONTIN) 600 MG tablet Take 1 tablet  by mouth at bedtime. 90 tablet 1   Galcanezumab-gnlm (EMGALITY) 120 MG/ML SOAJ Inject 168m once monthly 3 mL 1   HYDROcodone-acetaminophen (NORCO) 10-325 MG tablet Take 1 tablet by mouth 3 times daily. 90 tablet 0   loratadine (CLARITIN) 10 MG tablet Take 1 tablet (10 mg total) by mouth daily. 90 tablet 1   metoCLOPramide (REGLAN) 10 MG tablet Take 1 tablet (10 mg total) by mouth every 8 (eight) hours as needed for up to 20 doses for nausea. 20 tablet 0   metoprolol succinate (TOPROL-XL) 25 MG 24 hr tablet Take 1 tablet by mouth daily. 90 tablet 3   Multiple Vitamin (MULTIVITAMIN WITH MINERALS) TABS tablet Take 1 tablet by mouth daily.     OLANZapine-FLUoxetine (SYMBYAX) 3-25 MG capsule Take 1 capsule by mouth every evening. 30 capsule 2   ondansetron (ZOFRAN) 4 MG tablet Take 1 tablet by mouth every 8 hours as needed for nausea or vomiting. 30 tablet 3   oxybutynin (DITROPAN-XL) 5 MG 24 hr tablet Take 1 (one) tablet by mouth at night for overactive  bladder/urinary frequency.  Replaces tolterodine. 30 tablet 2   pantoprazole (PROTONIX) 40 MG tablet Take 1 tablet by mouth daily. 90 tablet 1   rosuvastatin (CRESTOR) 5 MG tablet Take 1 tablet (5 mg total) by mouth daily. 90 tablet 0   Semaglutide-Weight Management (WEGOVY) 2.4 MG/0.75ML SOAJ Inject 2.4 mg into the skin once a week. 3 mL 3   senna-docusate (SENOKOT-S) 8.6-50 MG tablet Take 2 tablets by mouth daily. 90 tablet 1   Ubrogepant (UBRELVY) 100 MG TABS TAKE  1/2 TABLET BY MOUTH AT HEADACHE ONSET. MAY REPEAT DOSE IN TWO HOURS IF NO IMPROVEMENT. DO NOT EXCEED MORE THAN TWO TABLETS IN 24 HOURS. 18 tablet 1   vortioxetine HBr (TRINTELLIX) 10 MG TABS tablet Take 1 tablet by mouth daily. 30 tablet 2   fluconazole (DIFLUCAN) 150 MG tablet Take 150 mg by mouth once.     meloxicam (MOBIC) 7.5 MG tablet Take 1 tablet by mouth twice daily as needed for pain 60 tablet 1   furosemide (LASIX) 20 MG tablet Take 1 tablet by mouth daily as needed. 90 tablet 3   benzonatate (TESSALON) 100 MG capsule Take 1 capsule (100 mg total) by mouth 3 (three) times daily as needed. 30 capsule 0   cephALEXin (KEFLEX) 250 MG capsule Take 1 capsule by mouth once daily after relations as directed. 30 capsule 3   No facility-administered medications prior to visit.    Allergies  Allergen Reactions   Codeine Hives and Rash   Morphine Hives  and Other (See Comments)    Headaches, also   Morphine And Related Hives   Chlorhexidine Hives and Rash         Review of Systems CONSTITUTIONAL: Negative for chills, fatigue, fever,  CARDIOVASCULAR: Negative for chest pain, dizziness RESPIRATORY: Negative for recent cough and dyspnea.   NEUROLOGICAL:see HPI PSYCHIATRIC: see HPI - pt with documented history of major depressive disorder, GAD, and somatization disorder        Objective:  PHYSICAL EXAM:   VS: BP 100/68 (BP Location: Left Arm, Patient Position: Sitting)   Pulse 78   Temp (!) 97.5 F (36.4 C) (Oral)    Ht 5' 4"  (1.626 m)   Wt 211 lb (95.7 kg)   SpO2 99%   BMI 36.22 kg/m   GEN: Well nourished, well developed,   Cardiac: RRR; no murmurs, rubs, or gallops, Respiratory:  normal respiratory rate and pattern with no distress - normal breath sounds with no rales, rhonchi, wheezes or rubs  Skin: warm and dry, no rash  Neuro:  see HPI for full description --- mental status and behavior changed throughout exam Psych: see above   Lab Results  Component Value Date   TSH 0.512 07/14/2021   Lab Results  Component Value Date   WBC 4.4 03/03/2022   HGB 12.2 03/03/2022   HCT 37.4 03/03/2022   MCV 90.8 03/03/2022   PLT 211 03/03/2022   Lab Results  Component Value Date   NA 138 03/03/2022   K 4.6 03/03/2022   CO2 25 03/03/2022   GLUCOSE 89 03/03/2022   BUN 11 03/03/2022   CREATININE 1.05 (H) 03/03/2022   BILITOT 0.8 03/03/2022   ALKPHOS 34 (L) 03/03/2022   AST 21 03/03/2022   ALT 16 03/03/2022   PROT 7.5 03/03/2022   ALBUMIN 4.1 03/03/2022   CALCIUM 9.5 03/03/2022   ANIONGAP 5 03/03/2022   EGFR 71 02/06/2022   Lab Results  Component Value Date   CHOL 208 (H) 02/06/2022   Lab Results  Component Value Date   HDL 49 02/06/2022   Lab Results  Component Value Date   LDLCALC 141 (H) 02/06/2022   Lab Results  Component Value Date   TRIG 99 02/06/2022   Lab Results  Component Value Date   CHOLHDL 4.2 02/06/2022   Lab Results  Component Value Date   HGBA1C 5.5 07/14/2021       Assessment & Plan:   Problem List Items Addressed This Visit   None Visit Diagnoses     Transient alteration of awareness    -  Primary   Relevant Orders   CT HEAD WO CONTRAST (5MM) - stat today since she fell and had head injury yesterday with worsening headache and mental status changes per husband   CBC with Differential/Platelet   Comprehensive metabolic panel   TSH   MR Brain Wo Contrast   MR ANGIO HEAD WO W CONTRAST   Ambulatory referral to Neurology   Cerebral infarction,  unspecified mechanism ([place])       Relevant Orders   MR Brain Wo Contrast   MR ANGIO HEAD WO W CONTRAST   Ambulatory referral to Neurology      No orders of the defined types were placed in this encounter.   Orders Placed This Encounter  Procedures   CT HEAD WO CONTRAST (5MM)   MR Brain Wo Contrast   MR ANGIO HEAD WO W CONTRAST   CBC with Differential/Platelet   Comprehensive metabolic panel   TSH  Ambulatory referral to Neurology     Follow-up: Return in about 2 weeks (around 03/27/2022) for with Jerrell Belfast NP for chronic follow up.  An After Visit Summary was printed and given to the patient.  Yetta Flock [facility] [phone]

## 2022-03-14 ENCOUNTER — Other Ambulatory Visit: Payer: Self-pay | Admitting: Nurse Practitioner

## 2022-03-14 ENCOUNTER — Other Ambulatory Visit: Payer: Self-pay

## 2022-03-14 ENCOUNTER — Other Ambulatory Visit (HOSPITAL_COMMUNITY): Payer: Self-pay

## 2022-03-14 DIAGNOSIS — G43009 Migraine without aura, not intractable, without status migrainosus: Secondary | ICD-10-CM

## 2022-03-14 DIAGNOSIS — R404 Transient alteration of awareness: Secondary | ICD-10-CM

## 2022-03-14 LAB — CBC WITH DIFFERENTIAL/PLATELET
Basophils Absolute: 0 10*3/uL (ref 0.0–0.2)
Basos: 1 %
EOS (ABSOLUTE): 0.1 10*3/uL (ref 0.0–0.4)
Eos: 1 %
Hematocrit: 38.6 % (ref 34.0–46.6)
Hemoglobin: 12.4 g/dL (ref 11.1–15.9)
Immature Grans (Abs): 0 10*3/uL (ref 0.0–0.1)
Immature Granulocytes: 0 %
Lymphocytes Absolute: 1.9 10*3/uL (ref 0.7–3.1)
Lymphs: 43 %
MCH: 29 pg (ref 26.6–33.0)
MCHC: 32.1 g/dL (ref 31.5–35.7)
MCV: 90 fL (ref 79–97)
Monocytes Absolute: 0.3 10*3/uL (ref 0.1–0.9)
Monocytes: 7 %
Neutrophils Absolute: 2.1 10*3/uL (ref 1.4–7.0)
Neutrophils: 48 %
Platelets: 235 10*3/uL (ref 150–450)
RBC: 4.27 x10E6/uL (ref 3.77–5.28)
RDW: 13.1 % (ref 11.7–15.4)
WBC: 4.4 10*3/uL (ref 3.4–10.8)

## 2022-03-14 LAB — COMPREHENSIVE METABOLIC PANEL
ALT: 15 IU/L (ref 0–32)
AST: 17 IU/L (ref 0–40)
Albumin/Globulin Ratio: 1.8 (ref 1.2–2.2)
Albumin: 4.6 g/dL (ref 3.9–4.9)
Alkaline Phosphatase: 53 IU/L (ref 44–121)
BUN/Creatinine Ratio: 9 (ref 9–23)
BUN: 9 mg/dL (ref 6–24)
Bilirubin Total: <0.2 mg/dL (ref 0.0–1.2)
CO2: 23 mmol/L (ref 20–29)
Calcium: 9.7 mg/dL (ref 8.7–10.2)
Chloride: 103 mmol/L (ref 96–106)
Creatinine, Ser: 1.05 mg/dL — ABNORMAL HIGH (ref 0.57–1.00)
Globulin, Total: 2.5 g/dL (ref 1.5–4.5)
Glucose: 81 mg/dL (ref 70–99)
Potassium: 4.5 mmol/L (ref 3.5–5.2)
Sodium: 140 mmol/L (ref 134–144)
Total Protein: 7.1 g/dL (ref 6.0–8.5)
eGFR: 66 mL/min/{1.73_m2} (ref >59)

## 2022-03-14 LAB — TSH: TSH: 0.407 u[IU]/mL — ABNORMAL LOW (ref 0.450–4.500)

## 2022-03-14 MED ORDER — EMGALITY 120 MG/ML ~~LOC~~ SOAJ
SUBCUTANEOUS | 1 refills | Status: DC
  Filled 2022-03-14 – 2022-05-03 (×2): qty 3, 28d supply, fill #0
  Filled 2022-05-03: qty 1, 28d supply, fill #0
  Filled 2022-06-01: qty 1, 28d supply, fill #1

## 2022-03-14 MED ORDER — GALCANEZUMAB-GNLM 120 MG/ML ~~LOC~~ SOAJ
SUBCUTANEOUS | 1 refills | Status: DC
  Filled 2022-03-14 (×2): qty 1, 28d supply, fill #0
  Filled 2022-04-08: qty 1, 28d supply, fill #1
  Filled 2022-07-03: qty 1, 28d supply, fill #2
  Filled 2022-08-01 – 2022-12-13 (×2): qty 1, 28d supply, fill #3

## 2022-03-15 ENCOUNTER — Other Ambulatory Visit (HOSPITAL_COMMUNITY): Payer: Self-pay

## 2022-03-15 ENCOUNTER — Other Ambulatory Visit: Payer: Self-pay | Admitting: Physician Assistant

## 2022-03-15 DIAGNOSIS — R404 Transient alteration of awareness: Secondary | ICD-10-CM

## 2022-03-15 DIAGNOSIS — I639 Cerebral infarction, unspecified: Secondary | ICD-10-CM

## 2022-03-16 DIAGNOSIS — I6381 Other cerebral infarction due to occlusion or stenosis of small artery: Secondary | ICD-10-CM | POA: Diagnosis not present

## 2022-03-16 DIAGNOSIS — R4182 Altered mental status, unspecified: Secondary | ICD-10-CM | POA: Diagnosis not present

## 2022-03-17 ENCOUNTER — Encounter (HOSPITAL_COMMUNITY): Payer: Self-pay

## 2022-03-17 ENCOUNTER — Ambulatory Visit (HOSPITAL_COMMUNITY)
Admission: RE | Admit: 2022-03-17 | Discharge: 2022-03-17 | Disposition: A | Discharge status: Home / Self Care | Payer: 59 | Source: Ambulatory Visit | Attending: Physician Assistant | Admitting: Physician Assistant

## 2022-03-17 DIAGNOSIS — R404 Transient alteration of awareness: Secondary | ICD-10-CM | POA: Diagnosis not present

## 2022-03-17 DIAGNOSIS — I639 Cerebral infarction, unspecified: Secondary | ICD-10-CM | POA: Insufficient documentation

## 2022-03-19 ENCOUNTER — Encounter: Payer: Self-pay | Admitting: Physician Assistant

## 2022-03-19 ENCOUNTER — Other Ambulatory Visit: Payer: Self-pay | Admitting: Nurse Practitioner

## 2022-03-19 ENCOUNTER — Encounter: Payer: Self-pay | Admitting: Nurse Practitioner

## 2022-03-19 DIAGNOSIS — Z8619 Personal history of other infectious and parasitic diseases: Secondary | ICD-10-CM

## 2022-03-19 MED ORDER — FLUCONAZOLE 150 MG PO TABS: 150.0000 mg | ORAL_TABLET | Freq: Once | ORAL | 1 refills | Status: AC

## 2022-03-20 ENCOUNTER — Encounter: Payer: Self-pay | Admitting: Nurse Practitioner

## 2022-03-20 ENCOUNTER — Ambulatory Visit (HOSPITAL_COMMUNITY)
Admission: RE | Admit: 2022-03-20 | Discharge: 2022-03-20 | Disposition: A | Discharge status: Home / Self Care | Payer: 59 | Source: Ambulatory Visit | Attending: Physician Assistant | Admitting: Physician Assistant

## 2022-03-20 DIAGNOSIS — R404 Transient alteration of awareness: Secondary | ICD-10-CM | POA: Diagnosis not present

## 2022-03-20 DIAGNOSIS — I6523 Occlusion and stenosis of bilateral carotid arteries: Secondary | ICD-10-CM | POA: Diagnosis not present

## 2022-03-20 DIAGNOSIS — Q283 Other malformations of cerebral vessels: Secondary | ICD-10-CM | POA: Diagnosis not present

## 2022-03-20 DIAGNOSIS — I639 Cerebral infarction, unspecified: Secondary | ICD-10-CM | POA: Diagnosis not present

## 2022-03-20 DIAGNOSIS — I6621 Occlusion and stenosis of right posterior cerebral artery: Secondary | ICD-10-CM | POA: Diagnosis not present

## 2022-03-21 ENCOUNTER — Encounter (HOSPITAL_COMMUNITY): Payer: Self-pay | Admitting: Psychiatry

## 2022-03-21 ENCOUNTER — Telehealth (HOSPITAL_BASED_OUTPATIENT_CLINIC_OR_DEPARTMENT_OTHER): Payer: 59 | Admitting: Psychiatry

## 2022-03-21 ENCOUNTER — Other Ambulatory Visit (HOSPITAL_COMMUNITY): Payer: Self-pay

## 2022-03-21 DIAGNOSIS — F4310 Post-traumatic stress disorder, unspecified: Secondary | ICD-10-CM

## 2022-03-21 DIAGNOSIS — F419 Anxiety disorder, unspecified: Secondary | ICD-10-CM | POA: Diagnosis not present

## 2022-03-21 DIAGNOSIS — F331 Major depressive disorder, recurrent, moderate: Secondary | ICD-10-CM | POA: Diagnosis not present

## 2022-03-21 MED ORDER — VORTIOXETINE HBR 10 MG PO TABS
10.0000 mg | ORAL_TABLET | Freq: Every day | ORAL | 2 refills | Status: DC
  Filled 2022-03-21 – 2022-04-02 (×2): qty 30, 30d supply, fill #0
  Filled 2022-05-03: qty 30, 30d supply, fill #1
  Filled 2022-06-01: qty 30, 30d supply, fill #2

## 2022-03-21 MED ORDER — ALPRAZOLAM 0.5 MG PO TABS
0.5000 mg | ORAL_TABLET | Freq: Every day | ORAL | 2 refills | Status: DC
  Filled 2022-03-21 – 2022-04-02 (×2): qty 30, 30d supply, fill #0
  Filled 2022-05-03: qty 30, 30d supply, fill #1
  Filled 2022-06-01: qty 30, 30d supply, fill #2

## 2022-03-21 MED ORDER — OLANZAPINE-FLUOXETINE HCL 3-25 MG PO CAPS
1.0000 | ORAL_CAPSULE | Freq: Every evening | ORAL | 2 refills | Status: DC
  Filled 2022-03-21 – 2022-04-08 (×2): qty 30, 30d supply, fill #0
  Filled 2022-05-12: qty 30, 30d supply, fill #1
  Filled 2022-06-22: qty 30, 30d supply, fill #2
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
## 2022-03-22 ENCOUNTER — Encounter: Payer: Self-pay | Admitting: Nurse Practitioner

## 2022-03-23 ENCOUNTER — Other Ambulatory Visit (HOSPITAL_COMMUNITY): Payer: Self-pay

## 2022-03-25 ENCOUNTER — Other Ambulatory Visit: Payer: Self-pay | Admitting: Nurse Practitioner

## 2022-03-25 DIAGNOSIS — I639 Cerebral infarction, unspecified: Secondary | ICD-10-CM

## 2022-03-26 ENCOUNTER — Ambulatory Visit: Payer: 59 | Admitting: Nurse Practitioner

## 2022-04-02 ENCOUNTER — Other Ambulatory Visit (HOSPITAL_COMMUNITY): Payer: Self-pay

## 2022-04-03 ENCOUNTER — Other Ambulatory Visit (HOSPITAL_COMMUNITY): Payer: Self-pay

## 2022-04-03 DIAGNOSIS — Z7982 Long term (current) use of aspirin: Secondary | ICD-10-CM | POA: Diagnosis not present

## 2022-04-03 DIAGNOSIS — Z7902 Long term (current) use of antithrombotics/antiplatelets: Secondary | ICD-10-CM | POA: Diagnosis not present

## 2022-04-03 DIAGNOSIS — I671 Cerebral aneurysm, nonruptured: Secondary | ICD-10-CM | POA: Diagnosis not present

## 2022-04-03 DIAGNOSIS — Z8673 Personal history of transient ischemic attack (TIA), and cerebral infarction without residual deficits: Secondary | ICD-10-CM | POA: Diagnosis not present

## 2022-04-05 ENCOUNTER — Ambulatory Visit: Payer: 59 | Admitting: Nurse Practitioner

## 2022-04-05 DIAGNOSIS — Z8673 Personal history of transient ischemic attack (TIA), and cerebral infarction without residual deficits: Secondary | ICD-10-CM | POA: Diagnosis not present

## 2022-04-05 DIAGNOSIS — R404 Transient alteration of awareness: Secondary | ICD-10-CM | POA: Diagnosis not present

## 2022-04-05 DIAGNOSIS — I119 Hypertensive heart disease without heart failure: Secondary | ICD-10-CM | POA: Diagnosis not present

## 2022-04-05 DIAGNOSIS — G459 Transient cerebral ischemic attack, unspecified: Secondary | ICD-10-CM | POA: Diagnosis not present

## 2022-04-05 DIAGNOSIS — R569 Unspecified convulsions: Secondary | ICD-10-CM | POA: Diagnosis not present

## 2022-04-05 DIAGNOSIS — R410 Disorientation, unspecified: Secondary | ICD-10-CM | POA: Diagnosis not present

## 2022-04-05 DIAGNOSIS — R253 Fasciculation: Secondary | ICD-10-CM | POA: Diagnosis not present

## 2022-04-06 ENCOUNTER — Encounter: Payer: Self-pay | Admitting: Nurse Practitioner

## 2022-04-06 ENCOUNTER — Other Ambulatory Visit (HOSPITAL_COMMUNITY): Payer: Self-pay

## 2022-04-08 ENCOUNTER — Other Ambulatory Visit (HOSPITAL_COMMUNITY): Payer: Self-pay

## 2022-04-09 ENCOUNTER — Other Ambulatory Visit (HOSPITAL_COMMUNITY): Payer: Self-pay

## 2022-04-09 ENCOUNTER — Encounter: Payer: Self-pay | Admitting: Nurse Practitioner

## 2022-04-10 ENCOUNTER — Encounter: Payer: Self-pay | Admitting: Nurse Practitioner

## 2022-04-10 ENCOUNTER — Other Ambulatory Visit (HOSPITAL_COMMUNITY): Payer: Self-pay

## 2022-04-11 ENCOUNTER — Encounter: Payer: Self-pay | Admitting: Nurse Practitioner

## 2022-04-12 ENCOUNTER — Other Ambulatory Visit (HOSPITAL_COMMUNITY): Payer: Self-pay

## 2022-04-12 MED ORDER — CLOPIDOGREL BISULFATE 75 MG PO TABS
75.0000 mg | ORAL_TABLET | Freq: Every day | ORAL | 0 refills | Status: DC
  Filled 2022-04-12: qty 30, 30d supply, fill #0

## 2022-04-13 ENCOUNTER — Other Ambulatory Visit (HOSPITAL_COMMUNITY): Payer: Self-pay

## 2022-04-14 ENCOUNTER — Other Ambulatory Visit (HOSPITAL_COMMUNITY): Payer: Self-pay

## 2022-04-14 DIAGNOSIS — S59902A Unspecified injury of left elbow, initial encounter: Secondary | ICD-10-CM | POA: Diagnosis not present

## 2022-04-17 ENCOUNTER — Ambulatory Visit: Payer: 59 | Admitting: Physician Assistant

## 2022-04-18 ENCOUNTER — Other Ambulatory Visit (HOSPITAL_COMMUNITY): Payer: Self-pay

## 2022-04-18 ENCOUNTER — Encounter: Payer: Self-pay | Admitting: Nurse Practitioner

## 2022-04-25 ENCOUNTER — Other Ambulatory Visit: Payer: Self-pay | Admitting: Nurse Practitioner

## 2022-04-25 ENCOUNTER — Encounter: Payer: Self-pay | Admitting: Nurse Practitioner

## 2022-04-25 DIAGNOSIS — Z8619 Personal history of other infectious and parasitic diseases: Secondary | ICD-10-CM

## 2022-04-25 MED ORDER — FLUCONAZOLE 150 MG PO TABS: 150.0000 mg | ORAL_TABLET | Freq: Once | ORAL | 0 refills | Status: AC

## 2022-04-26 ENCOUNTER — Other Ambulatory Visit: Payer: Self-pay | Admitting: Nurse Practitioner

## 2022-05-03 ENCOUNTER — Other Ambulatory Visit (HOSPITAL_COMMUNITY): Payer: Self-pay

## 2022-05-03 ENCOUNTER — Other Ambulatory Visit: Payer: Self-pay | Admitting: Nurse Practitioner

## 2022-05-03 DIAGNOSIS — G619 Inflammatory polyneuropathy, unspecified: Secondary | ICD-10-CM

## 2022-05-03 MED ORDER — GABAPENTIN 600 MG PO TABS
600.0000 mg | ORAL_TABLET | Freq: Every day | ORAL | 1 refills | Status: DC
  Filled 2022-05-03: qty 90, 90d supply, fill #0
  Filled 2022-09-26: qty 90, 90d supply, fill #1

## 2022-05-04 ENCOUNTER — Other Ambulatory Visit (HOSPITAL_COMMUNITY): Payer: Self-pay

## 2022-05-05 ENCOUNTER — Other Ambulatory Visit (HOSPITAL_COMMUNITY): Payer: Self-pay

## 2022-05-11 ENCOUNTER — Encounter: Payer: Self-pay | Admitting: Nurse Practitioner

## 2022-05-11 ENCOUNTER — Other Ambulatory Visit (HOSPITAL_COMMUNITY): Payer: Self-pay

## 2022-05-11 ENCOUNTER — Other Ambulatory Visit: Payer: Self-pay | Admitting: Nurse Practitioner

## 2022-05-11 ENCOUNTER — Ambulatory Visit (INDEPENDENT_AMBULATORY_CARE_PROVIDER_SITE_OTHER): Payer: 59 | Admitting: Nurse Practitioner

## 2022-05-11 VITALS — BP 136/68 | HR 87 | Temp 96.1°F | Ht 64.0 in | Wt 206.0 lb

## 2022-05-11 DIAGNOSIS — Z8673 Personal history of transient ischemic attack (TIA), and cerebral infarction without residual deficits: Secondary | ICD-10-CM | POA: Diagnosis not present

## 2022-05-11 DIAGNOSIS — E785 Hyperlipidemia, unspecified: Secondary | ICD-10-CM | POA: Diagnosis not present

## 2022-05-11 DIAGNOSIS — I739 Peripheral vascular disease, unspecified: Secondary | ICD-10-CM

## 2022-05-11 DIAGNOSIS — Z7901 Long term (current) use of anticoagulants: Secondary | ICD-10-CM | POA: Diagnosis not present

## 2022-05-11 DIAGNOSIS — R7989 Other specified abnormal findings of blood chemistry: Secondary | ICD-10-CM

## 2022-05-11 DIAGNOSIS — I671 Cerebral aneurysm, nonruptured: Secondary | ICD-10-CM

## 2022-05-11 DIAGNOSIS — Z6835 Body mass index (BMI) 35.0-35.9, adult: Secondary | ICD-10-CM | POA: Diagnosis not present

## 2022-05-11 MED ORDER — CLOPIDOGREL BISULFATE 75 MG PO TABS
75.0000 mg | ORAL_TABLET | Freq: Every day | ORAL | 2 refills | Status: DC
  Filled 2022-05-11: qty 90, 90d supply, fill #0
  Filled 2022-08-02 – 2022-08-14 (×2): qty 90, 90d supply, fill #1
  Filled 2022-11-24 – 2022-12-06 (×2): qty 90, 90d supply, fill #2

## 2022-05-11 NOTE — Progress Notes (Unsigned)
Subjective:  Patient ID: Desiree Russell, female    DOB: August 01, 1970  Age: 51 y.o. MRN: [id number]  Chief Complaint  Patient presents with   Hyperlipidemia   Hypertension   Gastroesophageal Reflux    HPI  Desiree Russell is a 51 year old African-American female that presents for follow-up of HTN, hyperlipidemia, GERD, and weight management. She has a history of small vessel disease with history of CVA x2 without residual deficits. She is followed by neurology who recommended a hematology referral. Unfortunately, she does not have an appt until March 2024. Pt has requested a referral to hematology within [facility].     Hypertension follow-up: She was last seen for hypertension 6 months ago.  BP at that visit was 130/88. Management includes metoprolol. She reports excellent compliance with treatment. She is not having side effects.  She is following a Regular diet. She is not exercising. She does not smoke.     Use of agents associated with hypertension: none.    Pertinent labs: Lab Results  Component Value Date   CHOL 208 (H) 02/06/2022   HDL 49 02/06/2022   LDLCALC 141 (H) 02/06/2022   TRIG 99 02/06/2022   CHOLHDL 4.2 02/06/2022   Lab Results  Component Value Date   NA 140 03/13/2022   K 4.5 03/13/2022   CREATININE 1.05 (H) 03/13/2022   EGFR 66 03/13/2022   GFRNONAA >60 03/03/2022   GLUCOSE 81 03/13/2022     Lipid/Cholesterol, Follow-up  Last lipid panel Other pertinent labs  Lab Results  Component Value Date   CHOL 208 (H) 02/06/2022   HDL 49 02/06/2022   LDLCALC 141 (H) 02/06/2022   TRIG 99 02/06/2022   CHOLHDL 4.2 02/06/2022   Lab Results  Component Value Date   ALT 15 03/13/2022   AST 17 03/13/2022   PLT 235 03/13/2022   TSH 0.407 (L) 03/13/2022     She was last seen for this 6 months ago.  Management includes crestor. She reports excellent compliance with treatment. She is not having side effects.    GERD, Follow up:  The patient was last seen for  GERD 6 months ago. Current treatment consist of: Protonix and reglan She reports excellent compliance with treatment. She is not having side effects. .  Weight management, follow-up: Pt has modified her diet and is exercising daily. She is prescribed Semaglutide 2.4 mg injection weekly. Denies side effects of medication. Current weight 206 lbs, BMI 35.36.    Current Outpatient Medications on File Prior to Visit  Medication Sig Dispense Refill   acetaminophen (TYLENOL) 325 MG tablet Take 650 mg by mouth every 6 (six) hours as needed for mild pain, fever or headache.     ALPRAZolam (XANAX) 0.5 MG tablet Take 1 tablet (0.5 mg total) by mouth at bedtime. 30 tablet 2   aspirin 81 MG EC tablet Take 1 tablet (81 mg total) by mouth daily. 21 tablet 0   Calcium-Vitamin D-Vitamin K 650-12.5-40 MG-MCG-MCG CHEW Chew 1 tablet by mouth daily.     clopidogrel (PLAVIX) 75 MG tablet Take 1 tablet (75 mg total) by mouth once daily 30 tablet 0   cyclobenzaprine (FLEXERIL) 10 MG tablet Take 5-10 mg by mouth at bedtime as needed for muscle spasms.     dicyclomine (BENTYL) 20 MG tablet Take 1 tablet (20 mg total) by mouth 2 (two) times daily as needed for spasms. 60 tablet 0   furosemide (LASIX) 20 MG tablet Take 1 tablet by mouth daily as needed. 90 tablet  3   gabapentin (NEURONTIN) 300 MG capsule Take 3 capsules by mouth at bedtime. 270 capsule 3   gabapentin (NEURONTIN) 600 MG tablet Take 1 tablet  by mouth at bedtime. 90 tablet 1   Galcanezumab-gnlm (EMGALITY) 120 MG/ML SOAJ Inject 131m once monthly 3 mL 1   Galcanezumab-gnlm 120 MG/ML SOAJ Inject 120 mg into the skin every 28 days. 3 mL 1   loratadine (CLARITIN) 10 MG tablet Take 1 tablet (10 mg total) by mouth daily. 90 tablet 1   metoCLOPramide (REGLAN) 10 MG tablet Take 1 tablet (10 mg total) by mouth every 8 (eight) hours as needed for up to 20 doses for nausea. 20 tablet 0   metoprolol succinate (TOPROL-XL) 25 MG 24 hr tablet Take 1 tablet by mouth  daily. 90 tablet 3   Multiple Vitamin (MULTIVITAMIN WITH MINERALS) TABS tablet Take 1 tablet by mouth daily.     OLANZapine-FLUoxetine (SYMBYAX) 3-25 MG capsule Take 1 capsule by mouth every evening. 30 capsule 2   ondansetron (ZOFRAN) 4 MG tablet Take 1 tablet by mouth every 8 hours as needed for nausea or vomiting. 30 tablet 3   oxybutynin (DITROPAN-XL) 5 MG 24 hr tablet Take 1 (one) tablet by mouth at night for overactive bladder/urinary frequency.  Replaces tolterodine. 30 tablet 2   pantoprazole (PROTONIX) 40 MG tablet Take 1 tablet by mouth daily. 90 tablet 1   rosuvastatin (CRESTOR) 5 MG tablet Take 1 tablet (5 mg total) by mouth daily. 90 tablet 0   Semaglutide-Weight Management (WEGOVY) 2.4 MG/0.75ML SOAJ Inject 2.4 mg into the skin once a week. 3 mL 3   senna-docusate (SENOKOT-S) 8.6-50 MG tablet Take 2 tablets by mouth daily. 90 tablet 1   Ubrogepant (UBRELVY) 100 MG TABS TAKE  1/2 TABLET BY MOUTH AT HEADACHE ONSET. MAY REPEAT DOSE IN TWO HOURS IF NO IMPROVEMENT. DO NOT EXCEED MORE THAN TWO TABLETS IN 24 HOURS. 18 tablet 1   vortioxetine HBr (TRINTELLIX) 10 MG TABS tablet Take 1 tablet by mouth daily. 30 tablet 2   No current facility-administered medications on file prior to visit.   Past Medical History:  Diagnosis Date   Allergy    Anemia    Anxiety    Arthritis    Dyslipidemia    Fibromyalgia    GERD (gastroesophageal reflux disease)    Headache    Migraines    Pericarditis    [place]   Tachycardia    TIA (transient ischemic attack)    Recurrent   Past Surgical History:  Procedure Laterality Date   ABDOMINAL HYSTERECTOMY     CHOLECYSTECTOMY     CHOLECYSTECTOMY, LAPAROSCOPIC     COLONOSCOPY     UPPER GASTROINTESTINAL ENDOSCOPY      Family History  Problem Relation Age of Onset   Hypertension Mother    Hypertension Father    Heart attack Father 569  Healthy Sister    Healthy Sister    Healthy Sister    Hypertension Brother    Diabetes type II  Brother    COPD Brother    Healthy Brother    Healthy Brother    Breast cancer Maternal Aunt    Throat cancer Maternal Uncle    Hypertension Maternal Grandmother    Diabetes Paternal GGeophysicist/field seismologist   Healthy Daughter    Healthy Son    Healthy Son    Healthy Son    Colon cancer Neg Hx    Pancreatic cancer Neg Hx  Stomach cancer Neg Hx    Liver disease Neg Hx    Esophageal cancer Neg Hx    Rectal cancer Neg Hx    Social History   Socioeconomic History   Marital status: Married    Spouse name: Not on file   Number of children: 4   Years of education: Not on file   Highest education level: Not on file  Occupational History   Not on file  Tobacco Use   Smoking status: Never   Smokeless tobacco: Never  Vaping Use   Vaping Use: Never used  Substance and Sexual Activity   Alcohol use: Yes    Comment: occ    Drug use: No   Sexual activity: Not on file  Other Topics Concern   Not on file  Social History Narrative   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     Social Determinants of Health   Financial Resource Strain: Low Risk  (12/20/2021)   Overall Financial Resource Strain (CARDIA)    Difficulty of Paying Living Expenses: Not hard at all  Food Insecurity: No Food Insecurity (12/20/2021)   Hunger Vital Sign    Worried About Running Out of Food in the Last Year: Never true    Ran Out of Food in the Last Year: Never true  Transportation Needs: No Transportation Needs (12/20/2021)   PRAPARE - Hydrologist (Medical): No    Lack of Transportation (Non-Medical): No  Physical Activity: Sufficiently Active (12/20/2021)   Exercise Vital Sign    Days of Exercise per Week: 5 days    Minutes of Exercise per Session: 60 min  Stress: No Stress Concern Present (12/20/2021)   [place]    Feeling of Stress : Not at all  Social Connections: Otho (12/20/2021)    Social Connection and Isolation Panel [NHANES]    Frequency of Communication with Friends and Family: More than three times a week    Frequency of Social Gatherings with Friends and Family: More than three times a week    Attends Religious Services: More than 4 times per year    Active Member of [employer] or Organizations: Yes    Attends Music therapist: More than 4 times per year    Marital Status: Married    Review of Systems  Constitutional:  Negative for chills, fatigue and fever.  HENT:  Negative for congestion, ear pain, rhinorrhea and sore throat.   Respiratory:  Negative for cough and shortness of breath.   Cardiovascular:  Negative for chest pain.  Gastrointestinal:  Negative for abdominal pain, constipation, diarrhea, nausea and vomiting.  Genitourinary:  Negative for dysuria and urgency.  Musculoskeletal:  Negative for back pain and myalgias.  Neurological:  Negative for dizziness, weakness, light-headedness and headaches.  Psychiatric/Behavioral:  Negative for dysphoric mood. The patient is not nervous/anxious.      Objective:  BP 136/68   Pulse 87   Temp (!) 96.1 F (35.6 C)   Ht 5' 4"  (1.626 m)   Wt 206 lb (93.4 kg)   SpO2 99%   BMI 35.36 kg/m      05/11/2022    7:41 AM 03/21/2022    3:48 PM 03/13/2022   10:40 AM  BP/Weight  Systolic BP 161  096  Diastolic BP 68  68  Wt. (Lbs) 206  211  BMI 35.36 kg/m2  36.22 kg/m2  Information is confidential and restricted. Go to Review Flowsheets to unlock data.    Physical Exam Vitals reviewed.  Constitutional:      Appearance: Normal appearance.  HENT:     Head: Normocephalic.     Right Ear: Tympanic membrane normal.     Left Ear: Tympanic membrane normal.     Nose: Nose normal.     Mouth/Throat:     Mouth: Mucous membranes are moist.  Eyes:     Pupils: Pupils are equal, round, and reactive to light.  Cardiovascular:     Rate and Rhythm: Normal rate and regular rhythm.  Pulmonary:      Effort: Pulmonary effort is normal.     Breath sounds: Normal breath sounds.  Abdominal:     General: Bowel sounds are normal.     Palpations: Abdomen is soft.  Musculoskeletal:        General: Normal range of motion.     Cervical back: Neck supple.  Skin:    General: Skin is warm and dry.     Capillary Refill: Capillary refill takes less than 2 seconds.  Neurological:     General: No focal deficit present.     Mental Status: She is alert and oriented to person, place, and time.  Psychiatric:        Mood and Affect: Mood normal.        Behavior: Behavior normal.    Lab Results  Component Value Date   WBC 4.4 03/13/2022   HGB 12.4 03/13/2022   HCT 38.6 03/13/2022   PLT 235 03/13/2022   GLUCOSE 81 03/13/2022   CHOL 208 (H) 02/06/2022   TRIG 99 02/06/2022   HDL 49 02/06/2022   LDLCALC 141 (H) 02/06/2022   ALT 15 03/13/2022   AST 17 03/13/2022   NA 140 03/13/2022   K 4.5 03/13/2022   CL 103 03/13/2022   CREATININE 1.05 (H) 03/13/2022   BUN 9 03/13/2022   CO2 23 03/13/2022   TSH 0.407 (L) 03/13/2022   INR 1.1 03/10/2021   HGBA1C 5.5 07/14/2021      Assessment & Plan:   1. Dyslipidemia - CBC with Differential/Platelet - Comprehensive metabolic panel - Lipid panel  2. Low TSH level - T4, free - TSH  3. Small vessel disease (HCC) - CBC with Differential/Platelet - Comprehensive metabolic panel - Lipid panel  4. History of CVA (cerebrovascular accident) - CBC with Differential/Platelet - Comprehensive metabolic panel - Lipid panel - Ambulatory referral to Hematology / Oncology  5. BMI 35.0-35.9,adult - CBC with Differential/Platelet - Comprehensive metabolic panel - Lipid panel - T4, free - TSH       Follow-up: 86-month, fasting  An After Visit Summary was printed and given to the patient.  I, SRip Harbour NP, have reviewed all documentation for this visit. The documentation on 05/11/22 for the exam, diagnosis, procedures, and orders are  all accurate and complete.   Signed, SRip Harbour NP CAdeline([phone]

## 2022-05-11 NOTE — Patient Instructions (Signed)
We will call you with lab results                     Preventive Care 49-49 Years Old, Female Preventive care refers to lifestyle choices and visits with your health care provider that can promote health and wellness. Preventive care visits are also called wellness exams. What can I expect for my preventive care visit? Counseling Your health care provider may ask you questions about your: Medical history, including: Past medical problems. Family medical history. Pregnancy history. Current health, including: Menstrual cycle. Method of birth control. Emotional well-being. Home life and relationship well-being. Sexual activity and sexual health. Lifestyle, including: Alcohol, nicotine or tobacco, and drug use. Access to firearms. Diet, exercise, and sleep habits. Work and work Statistician. Sunscreen use. Safety issues such as seatbelt and bike helmet use. Physical exam Your health care provider will check your: Height and weight. These may be used to calculate your BMI (body mass index). BMI is a measurement that tells if you are at a healthy weight. Waist circumference. This measures the distance around your waistline. This measurement also tells if you are at a healthy weight and may help predict your risk of certain diseases, such as type 2 diabetes and high blood pressure. Heart rate and blood pressure. Body temperature. Skin for abnormal spots. What immunizations do I need?  Vaccines are usually given at various ages, according to a schedule. Your health care provider will recommend vaccines for you based on your age, medical history, and lifestyle or other factors, such as travel or where you work. What tests do I need? Screening Your health care provider may recommend screening tests for certain conditions. This may include: Lipid and cholesterol levels. Diabetes screening. This is done by checking your blood sugar (glucose) after you have not eaten for a while  (fasting). Pelvic exam and Pap test. Hepatitis B test. Hepatitis C test. HIV (human immunodeficiency virus) test. STI (sexually transmitted infection) testing, if you are at risk. Lung cancer screening. Colorectal cancer screening. Mammogram. Talk with your health care provider about when you should start having regular mammograms. This may depend on whether you have a family history of breast cancer. BRCA-related cancer screening. This may be done if you have a family history of breast, ovarian, tubal, or peritoneal cancers. Bone density scan. This is done to screen for osteoporosis. Talk with your health care provider about your test results, treatment options, and if necessary, the need for more tests. Follow these instructions at home: Eating and drinking  Eat a diet that includes fresh fruits and vegetables, whole grains, lean protein, and low-fat dairy products. Take vitamin and mineral supplements as recommended by your health care provider. Do not drink alcohol if: Your health care provider tells you not to drink. You are pregnant, may be pregnant, or are planning to become pregnant. If you drink alcohol: Limit how much you have to 0-1 drink a day. Know how much alcohol is in your drink. In the [place] one drink equals one 12 oz bottle of beer (355 mL), one 5 oz glass of wine (148 mL), or one 1 oz glass of hard liquor (44 mL). Lifestyle Brush your teeth every morning and night with fluoride toothpaste. Floss one time each day. Exercise for at least 30 minutes 5 or more days each week. Do not use any products that contain nicotine or tobacco. These products include cigarettes, chewing tobacco, and vaping devices, such as e-cigarettes. If you need help  quitting, ask your health care provider. Do not use drugs. If you are sexually active, practice safe sex. Use a condom or other form of protection to prevent STIs. If you do not wish to become pregnant, use a form of birth control. If  you plan to become pregnant, see your health care provider for a prepregnancy visit. Take aspirin only as told by your health care provider. Make sure that you understand how much to take and what form to take. Work with your health care provider to find out whether it is safe and beneficial for you to take aspirin daily. Find healthy ways to manage stress, such as: Meditation, yoga, or listening to music. Journaling. Talking to a trusted person. Spending time with friends and family. Minimize exposure to UV radiation to reduce your risk of skin cancer. Safety Always wear your seat belt while driving or riding in a vehicle. Do not drive: If you have been drinking alcohol. Do not ride with someone who has been drinking. When you are tired or distracted. While texting. If you have been using any mind-altering substances or drugs. Wear a helmet and other protective equipment during sports activities. If you have firearms in your house, make sure you follow all gun safety procedures. Seek help if you have been physically or sexually abused. What's next? Visit your health care provider once a year for an annual wellness visit. Ask your health care provider how often you should have your eyes and teeth checked. Stay up to date on all vaccines. This information is not intended to replace advice given to you by your health care provider. Make sure you discuss any questions you have with your health care provider. Document Revised: 01/11/2021 Document Reviewed: 01/11/2021 Elsevier Patient Education  [place].

## 2022-05-12 ENCOUNTER — Other Ambulatory Visit: Payer: Self-pay | Admitting: Nurse Practitioner

## 2022-05-12 ENCOUNTER — Encounter: Payer: Self-pay | Admitting: Nurse Practitioner

## 2022-05-12 LAB — COMPREHENSIVE METABOLIC PANEL
ALT: 13 IU/L (ref 0–32)
AST: 17 IU/L (ref 0–40)
Albumin/Globulin Ratio: 2.1 (ref 1.2–2.2)
Albumin: 4.7 g/dL (ref 3.9–4.9)
Alkaline Phosphatase: 45 IU/L (ref 44–121)
BUN/Creatinine Ratio: 11 (ref 9–23)
BUN: 12 mg/dL (ref 6–24)
Bilirubin Total: 0.3 mg/dL (ref 0.0–1.2)
CO2: 23 mmol/L (ref 20–29)
Calcium: 9.6 mg/dL (ref 8.7–10.2)
Chloride: 104 mmol/L (ref 96–106)
Creatinine, Ser: 1.13 mg/dL — ABNORMAL HIGH (ref 0.57–1.00)
Globulin, Total: 2.2 g/dL (ref 1.5–4.5)
Glucose: 74 mg/dL (ref 70–99)
Potassium: 4.6 mmol/L (ref 3.5–5.2)
Sodium: 139 mmol/L (ref 134–144)
Total Protein: 6.9 g/dL (ref 6.0–8.5)
eGFR: 60 mL/min/{1.73_m2} (ref >59)

## 2022-05-12 LAB — CBC WITH DIFFERENTIAL/PLATELET
Basophils Absolute: 0 10*3/uL (ref 0.0–0.2)
Basos: 1 %
EOS (ABSOLUTE): 0 10*3/uL (ref 0.0–0.4)
Eos: 1 %
Hematocrit: 38.8 % (ref 34.0–46.6)
Hemoglobin: 12.3 g/dL (ref 11.1–15.9)
Immature Grans (Abs): 0 10*3/uL (ref 0.0–0.1)
Immature Granulocytes: 0 %
Lymphocytes Absolute: 1.6 10*3/uL (ref 0.7–3.1)
Lymphs: 35 %
MCH: 28.8 pg (ref 26.6–33.0)
MCHC: 31.7 g/dL (ref 31.5–35.7)
MCV: 91 fL (ref 79–97)
Monocytes Absolute: 0.3 10*3/uL (ref 0.1–0.9)
Monocytes: 7 %
Neutrophils Absolute: 2.5 10*3/uL (ref 1.4–7.0)
Neutrophils: 56 %
Platelets: 224 10*3/uL (ref 150–450)
RBC: 4.27 x10E6/uL (ref 3.77–5.28)
RDW: 13.1 % (ref 11.7–15.4)
WBC: 4.4 10*3/uL (ref 3.4–10.8)

## 2022-05-12 LAB — LIPID PANEL
Chol/HDL Ratio: 2.7 ratio (ref 0.0–4.4)
Cholesterol, Total: 128 mg/dL (ref 100–199)
HDL: 47 mg/dL (ref >39)
LDL Chol Calc (NIH): 68 mg/dL (ref 0–99)
Triglycerides: 61 mg/dL (ref 0–149)
VLDL Cholesterol Cal: 13 mg/dL (ref 5–40)

## 2022-05-12 LAB — TSH: TSH: 0.444 u[IU]/mL — ABNORMAL LOW (ref 0.450–4.500)

## 2022-05-12 LAB — CARDIOVASCULAR RISK ASSESSMENT

## 2022-05-12 LAB — T4, FREE: Free T4: 1.31 ng/dL (ref 0.82–1.77)

## 2022-05-13 MED ORDER — ROSUVASTATIN CALCIUM 5 MG PO TABS
5.0000 mg | ORAL_TABLET | Freq: Every day | ORAL | 0 refills | Status: DC
  Filled 2022-05-13: qty 90, 90d supply, fill #0

## 2022-05-14 ENCOUNTER — Other Ambulatory Visit (HOSPITAL_COMMUNITY): Payer: Self-pay

## 2022-05-14 ENCOUNTER — Other Ambulatory Visit: Payer: Self-pay

## 2022-05-14 ENCOUNTER — Encounter: Payer: Self-pay | Admitting: Nurse Practitioner

## 2022-05-14 DIAGNOSIS — E0590 Thyrotoxicosis, unspecified without thyrotoxic crisis or storm: Secondary | ICD-10-CM

## 2022-05-14 MED ORDER — ROSUVASTATIN CALCIUM 5 MG PO TABS
5.0000 mg | ORAL_TABLET | Freq: Every day | ORAL | 0 refills | Status: DC
  Filled 2022-05-14: qty 90, 90d supply, fill #0

## 2022-05-15 ENCOUNTER — Other Ambulatory Visit: Payer: Self-pay | Admitting: Nurse Practitioner

## 2022-05-15 ENCOUNTER — Other Ambulatory Visit (HOSPITAL_COMMUNITY): Payer: Self-pay

## 2022-05-16 ENCOUNTER — Other Ambulatory Visit (HOSPITAL_COMMUNITY): Payer: Self-pay

## 2022-05-16 MED ORDER — CYCLOBENZAPRINE HCL 10 MG PO TABS
5.0000 mg | ORAL_TABLET | Freq: Every evening | ORAL | 0 refills | Status: DC | PRN
  Filled 2022-05-16: qty 30, 30d supply, fill #0

## 2022-05-17 ENCOUNTER — Other Ambulatory Visit: Payer: Self-pay | Admitting: Nurse Practitioner

## 2022-05-18 DIAGNOSIS — F445 Conversion disorder with seizures or convulsions: Secondary | ICD-10-CM | POA: Diagnosis not present

## 2022-05-28 ENCOUNTER — Encounter: Payer: Self-pay | Admitting: Nurse Practitioner

## 2022-05-28 ENCOUNTER — Other Ambulatory Visit: Payer: Self-pay | Admitting: Nurse Practitioner

## 2022-05-31 ENCOUNTER — Ambulatory Visit: Payer: 59 | Admitting: Physician Assistant

## 2022-05-31 ENCOUNTER — Other Ambulatory Visit (HOSPITAL_COMMUNITY): Payer: Self-pay

## 2022-06-01 ENCOUNTER — Other Ambulatory Visit (HOSPITAL_COMMUNITY): Payer: Self-pay

## 2022-06-05 ENCOUNTER — Encounter: Payer: Self-pay | Admitting: Legal Medicine

## 2022-06-05 ENCOUNTER — Ambulatory Visit (INDEPENDENT_AMBULATORY_CARE_PROVIDER_SITE_OTHER): Payer: 59 | Admitting: Legal Medicine

## 2022-06-05 ENCOUNTER — Other Ambulatory Visit (HOSPITAL_COMMUNITY): Payer: Self-pay

## 2022-06-05 VITALS — BP 118/72 | HR 84 | Temp 97.6°F | Resp 16 | Ht 64.0 in | Wt 198.0 lb

## 2022-06-05 DIAGNOSIS — S39012A Strain of muscle, fascia and tendon of lower back, initial encounter: Secondary | ICD-10-CM | POA: Diagnosis not present

## 2022-06-05 DIAGNOSIS — T7411XA Adult physical abuse, confirmed, initial encounter: Secondary | ICD-10-CM

## 2022-06-05 DIAGNOSIS — S161XXA Strain of muscle, fascia and tendon at neck level, initial encounter: Secondary | ICD-10-CM | POA: Diagnosis not present

## 2022-06-05 DIAGNOSIS — S59901A Unspecified injury of right elbow, initial encounter: Secondary | ICD-10-CM | POA: Diagnosis not present

## 2022-06-05 DIAGNOSIS — M47812 Spondylosis without myelopathy or radiculopathy, cervical region: Secondary | ICD-10-CM | POA: Diagnosis not present

## 2022-06-05 DIAGNOSIS — M25421 Effusion, right elbow: Secondary | ICD-10-CM | POA: Diagnosis not present

## 2022-06-05 DIAGNOSIS — M545 Low back pain, unspecified: Secondary | ICD-10-CM | POA: Diagnosis not present

## 2022-06-05 DIAGNOSIS — S199XXA Unspecified injury of neck, initial encounter: Secondary | ICD-10-CM | POA: Diagnosis not present

## 2022-06-05 DIAGNOSIS — M25411 Effusion, right shoulder: Secondary | ICD-10-CM | POA: Diagnosis not present

## 2022-06-05 DIAGNOSIS — M47817 Spondylosis without myelopathy or radiculopathy, lumbosacral region: Secondary | ICD-10-CM | POA: Diagnosis not present

## 2022-06-05 DIAGNOSIS — M542 Cervicalgia: Secondary | ICD-10-CM

## 2022-06-05 MED ORDER — TRAMADOL HCL 50 MG PO TABS
50.0000 mg | ORAL_TABLET | Freq: Three times a day (TID) | ORAL | 0 refills | Status: AC | PRN
  Filled 2022-06-05: qty 15, 5d supply, fill #0
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
## 2022-06-05 NOTE — Patient Instructions (Addendum)
+Elbow and Forearm Exercises Ask your health care provider which exercises are safe for you. Do exercises exactly as told by your health care provider and adjust them as directed. It is normal to feel mild stretching, pulling, tightness, or discomfort as you do these exercises. Stop right away if you feel sudden pain or your pain gets worse. Do not begin these exercises until told by your health care provider. Range-of-motion exercises These exercises warm up your muscles and joints and improve the movement and flexibility of your injured elbow and forearm. The exercises also help to relieve pain, numbness, and tingling. These exercises are done using the muscles in your injured elbow and forearm (active). Elbow flexion, active Hold your left / right arm at your side, and bend your elbow (flexion) as far as you can using only your arm muscles. Hold this position for __________ seconds. Slowly return to the starting position. Repeat __________ times. Complete this exercise __________ times a day. Elbow extension, active Hold your left / right arm at your side, and straighten your elbow (extension) as much as you can using only your arm muscles. Hold this position for __________ seconds. Slowly return to the starting position. Repeat __________ times. Complete this exercise __________ times a day. Active forearm rotation, supination This is an exercise in which you turn (rotate) your forearm palm up (supination). Stand or sit with your elbows at your sides. Bend your left / right elbow to a 90-degree angle (right angle). Rotate your palm up until you feel a gentle stretch on the inside of your forearm. Hold this position for __________ seconds. Slowly return to the starting position. Repeat __________ times. Complete this exercise __________ times a day. Active forearm rotation, pronation This is an exercise in which you turn (rotate) your forearm palm down (pronation). Stand or sit with your  elbows at your sides. Bend your left / right elbow to a 90-degree angle (right angle). Rotate your palm down until you feel a gentle stretch on the top of your forearm. Hold this position for __________ seconds. Slowly return to the starting position. Repeat __________ times. Complete this exercise __________ times a day. Stretching exercises These exercises warm up your muscles and joints and improve the movement and flexibility of your injured elbow and forearm. These exercises also help to relieve pain, numbness, and tingling. These exercises are done using your healthy elbow and forearm to help stretch the muscles in your injured elbow and forearm (active-assisted). Elbow flexion, active-assisted  Hold your left / right arm at your side, and bend your elbow (flexion) as much as you can using your left / right arm muscles. Use your other hand to bend your left / right elbow farther. To do this, gently push up on your forearm until you feel a gentle stretch on the back of your elbow. Hold this position for __________ seconds. Slowly return to the starting position. Repeat __________ times. Complete this exercise __________ times a day. Elbow extension, active-assisted  Hold your left / right arm at your side, and straighten your elbow (extension) as much as you can using your left / right arm muscles. Use your other hand to straighten the left / right elbow farther. To do this, gently push down on your forearm until you feel a gentle stretch on the inside of your elbow. Hold this position for __________ seconds. Slowly return to the starting position. Repeat __________ times. Complete this exercise __________ times a day. Active-assisted forearm rotation, supination This is  an exercise in which you turn (rotate) your forearm palm up (supination). Sit with your left / right elbow bent in a 90-degree angle (right angle) with your forearm resting on a table. Keeping your upper body and  shoulder still, rotate your forearm so your palm faces upward. Use your other hand to help rotate your forearm further until you feel a gentle to moderate stretch. Hold this position for __________ seconds. Slowly release the stretch and return to the starting position. Repeat __________ times. Complete this exercise __________ times a day. Active-assisted forearm rotation, pronation This is an exercise in which you turn (rotate) your forearm palm down (pronation). Sit with your left / right elbow bent in a 90-degree angle (right angle) with your forearm resting on a table. Keeping your upper body and shoulder still, rotate your forearm so your palm faces the tabletop. Use your other hand to help rotate your forearm further until you feel a gentle to moderate stretch. Hold this position for __________ seconds. Slowly release the stretch and return to the starting position. Repeat __________ times. Complete this exercise __________ times a day. Passive elbow flexion, supine Lie on your back (supine position). Extend your left / right arm up in the air, bracing it with your other hand. Let your left / right hand slowly lower toward your shoulder (passive flexion), while your elbow stays pointed toward the ceiling. You should feel a gentle stretch along the back of your upper arm and elbow. If instructed by your health care provider, you may increase the intensity of your stretch by adding a small wrist weight or hand weight. Hold this position for __________ seconds. Slowly return to the starting position. Repeat __________ times. Complete this exercise __________ times a day. Passive elbow extension, supine  Lie on your back (supine position). Make sure that you are in a comfortable position that lets you relax your arm muscles. Place a folded towel under your left / right upper arm so your elbow and shoulder are at the same height. Straighten your left / right arm so your elbow does not rest  on the bed or towel. Let the weight of your hand stretch your elbow (passive extension). Keep your arm and chest muscles relaxed. You should feel a stretch on the inside of your elbow. If told by your health care provider, you may increase the intensity of your stretch by adding a small wrist weight or hand weight. Hold this position for __________ seconds. Slowly release the stretch. Repeat __________ times. Complete this exercise __________ times a day. Strengthening exercises These exercises build strength and endurance in your elbow and forearm. Endurance is the ability to use your muscles for a long time, even after they get tired. Elbow flexion, isometric  Stand or sit up straight. Bend your left / right elbow in a 90-degree angle (right angle), and keep your forearm at the height of your waist. Your thumb should be pointed toward the ceiling (neutral forearm). Place your other hand on top of your left / right forearm. Gently push down while you resist with your left / right arm (isometric flexion). Push as hard as you can with both arms without causing any pain or movement at your left / right elbow. Hold this position for __________ seconds. Slowly release the tension in both arms. Let your muscles relax completely before you repeat the exercise. Repeat __________ times. Complete this exercise __________ times a day. Elbow extension, isometric  Stand or sit up straight. Place  your left / right arm so your palm faces your abdomen and is at the height of your waist. Place your other hand on the underside of your left / right forearm. Gently push up while you resist with your left / right arm (isometric extension). Push as hard as you can with both arms without causing any pain or movement at your left / right elbow. Hold this position for __________ seconds. Slowly release the tension in both arms. Let your muscles relax completely before you repeat the exercise. Repeat __________ times.  Complete this exercise __________ times a day. Elbow flexion with forearm palm up  Sit on a firm chair without armrests, or stand up. Place your left / right arm at your side with your elbow straight and your palm facing forward. Holding a __________weight or gripping a rubber exercise band or tubing, bend your elbow to bring your hand toward your shoulder (flexion). Hold this position for __________ seconds. Slowly return to the starting position. Repeat __________ times. Complete this exercise __________ times a day. Elbow extension, active  Sit on a firm chair without armrests, or stand up. Hold a rubber exercise band or tubing in both hands. Keeping your upper arms at your sides, bring both hands up to your left / right shoulder. Keep your left / right hand just below your other hand. Straighten your left / right elbow (extension) while keeping your other arm still. Hold this position for __________ seconds. Control the resistance of the band or tubing as you return to the starting position. Repeat __________ times. Complete this exercise __________ times a day. Forearm rotation, supination  Sit with your left / right forearm supported on a table. Your elbow should be at waist height and bent at a 90-degree angle (right angle). Gently grasp a lightweight hammer. Rest your hand over the edge of the table with your palm facing down. Without moving your left / right elbow, slowly rotate your forearm to turn your palm up toward the ceiling (supination). Hold this position for __________ seconds. Slowly return to the starting position. Repeat __________ times. Complete this exercise __________ times a day. Forearm rotation, pronation  Sit with your left / right forearm supported on a table. Keep your elbow below shoulder height. Gently grasp a lightweight hammer. Rest your hand over the edge of the table with your palm facing up. Without moving your left / right elbow, slowly rotate  your forearm to turn your palm down toward the floor (pronation). Hold this position for __________seconds. Slowly return to the starting position. Repeat __________ times. Complete this exercise __________ times a day. This information is not intended to replace advice given to you by your health care provider. Make sure you discuss any questions you have with your health care provider. Document Revised: 11/06/2018 Document Reviewed: 08/06/2018 [employer] Patient Education  [place] or Strain Rehab Ask your health care provider which exercises are safe for you. Do exercises exactly as told by your health care provider and adjust them as directed. It is normal to feel mild stretching, pulling, tightness, or discomfort as you do these exercises. Stop right away if you feel sudden pain or your pain gets worse. Do not begin these exercises until told by your health care provider. Stretching and range-of-motion exercises These exercises warm up your muscles and joints and improve the movement and flexibility of your back. These exercises also help to relieve pain, numbness, and tingling. Lumbar rotation  Lie on  your back on a firm bed or the floor with your knees bent. Straighten your arms out to your sides so each arm forms a 90-degree angle (right angle) with a side of your body. Slowly move (rotate) both of your knees to one side of your body until you feel a stretch in your lower back (lumbar). Try not to let your shoulders lift off the floor. Hold this position for __________ seconds. Tense your abdominal muscles and slowly move your knees back to the starting position. Repeat this exercise on the other side of your body. Repeat __________ times. Complete this exercise __________ times a day. Single knee to chest  Lie on your back on a firm bed or the floor with both legs straight. Bend one of your knees. Use your hands to move your knee up toward your chest until you  feel a gentle stretch in your lower back and buttock. Hold your leg in this position by holding on to the front of your knee. Keep your other leg as straight as possible. Hold this position for __________ seconds. Slowly return to the starting position. Repeat with your other leg. Repeat __________ times. Complete this exercise __________ times a day. Prone extension on elbows  Lie on your abdomen on a firm bed or the floor (prone position). Prop yourself up on your elbows. Use your arms to help lift your chest up until you feel a gentle stretch in your abdomen and your lower back. This will place some of your body weight on your elbows. If this is uncomfortable, try stacking pillows under your chest. Your hips should stay down, against the surface that you are lying on. Keep your hip and back muscles relaxed. Hold this position for __________ seconds. Slowly relax your upper body and return to the starting position. Repeat __________ times. Complete this exercise __________ times a day. Strengthening exercises These exercises build strength and endurance in your back. Endurance is the ability to use your muscles for a long time, even after they get tired. Pelvic tilt This exercise strengthens the muscles that lie deep in the abdomen. Lie on your back on a firm bed or the floor with your legs extended. Bend your knees so they are pointing toward the ceiling and your feet are flat on the floor. Tighten your lower abdominal muscles to press your lower back against the floor. This motion will tilt your pelvis so your tailbone points up toward the ceiling instead of pointing to your feet or the floor. To help with this exercise, you may place a small towel under your lower back and try to push your back into the towel. Hold this position for __________ seconds. Let your muscles relax completely before you repeat this exercise. Repeat __________ times. Complete this exercise __________ times a  day. Alternating arm and leg raises  Get on your hands and knees on a firm surface. If you are on a hard floor, you may want to use padding, such as an exercise mat, to cushion your knees. Line up your arms and legs. Your hands should be directly below your shoulders, and your knees should be directly below your hips. Lift your left leg behind you. At the same time, raise your right arm and straighten it in front of you. Do not lift your leg higher than your hip. Do not lift your arm higher than your shoulder. Keep your abdominal and back muscles tight. Keep your hips facing the ground. Do not arch your back.  Keep your balance carefully, and do not hold your breath. Hold this position for __________ seconds. Slowly return to the starting position. Repeat with your right leg and your left arm. Repeat __________ times. Complete this exercise __________ times a day. Abdominal set with straight leg raise  Lie on your back on a firm bed or the floor. Bend one of your knees and keep your other leg straight. Tense your abdominal muscles and lift your straight leg up, 4-6 inches (10-15 cm) off the ground. Keep your abdominal muscles tight and hold this position for __________ seconds. Do not hold your breath. Do not arch your back. Keep it flat against the ground. Keep your abdominal muscles tense as you slowly lower your leg back to the starting position. Repeat with your other leg. Repeat __________ times. Complete this exercise __________ times a day. Single leg lower with bent knees Lie on your back on a firm bed or the floor. Tense your abdominal muscles and lift your feet off the floor, one foot at a time, so your knees and hips are bent in 90-degree angles (right angles). Your knees should be over your hips and your lower legs should be parallel to the floor. Keeping your abdominal muscles tense and your knee bent, slowly lower one of your legs so your toe touches the ground. Lift your  leg back up to return to the starting position. Do not hold your breath. Do not let your back arch. Keep your back flat against the ground. Repeat with your other leg. Repeat __________ times. Complete this exercise __________ times a day. Posture and body mechanics Good posture and healthy body mechanics can help to relieve stress in your body's tissues and joints. Body mechanics refers to the movements and positions of your body while you do your daily activities. Posture is part of body mechanics. Good posture means: Your spine is in its natural S-curve position (neutral). Your shoulders are pulled back slightly. Your head is not tipped forward (neutral). Follow these guidelines to improve your posture and body mechanics in your everyday activities. Standing  When standing, keep your spine neutral and your feet about hip-width apart. Keep a slight bend in your knees. Your ears, shoulders, and hips should line up. When you do a task in which you stand in one place for a long time, place one foot up on a stable object that is 2-4 inches (5-10 cm) high, such as a footstool. This helps keep your spine neutral. Sitting  When sitting, keep your spine neutral and keep your feet flat on the floor. Use a footrest, if necessary, and keep your thighs parallel to the floor. Avoid rounding your shoulders, and avoid tilting your head forward. When working at a desk or a computer, keep your desk at a height where your hands are slightly lower than your elbows. Slide your chair under your desk so you are close enough to maintain good posture. When working at a computer, place your monitor at a height where you are looking straight ahead and you do not have to tilt your head forward or downward to look at the screen. Resting When lying down and resting, avoid positions that are most painful for you. If you have pain with activities such as sitting, bending, stooping, or squatting, lie in a position in which  your body does not bend very much. For example, avoid curling up on your side with your arms and knees near your chest (fetal position). If you have  pain with activities such as standing for a long time or reaching with your arms, lie with your spine in a neutral position and bend your knees slightly. Try the following positions: Lying on your side with a pillow between your knees. Lying on your back with a pillow under your knees. Lifting  When lifting objects, keep your feet at least shoulder-width apart and tighten your abdominal muscles. Bend your knees and hips and keep your spine neutral. It is important to lift using the strength of your legs, not your back. Do not lock your knees straight out. Always ask for help to lift heavy or awkward objects. This information is not intended to replace advice given to you by your health care provider. Make sure you discuss any questions you have with your health care provider. Document Revised: 10/03/2020 Document Reviewed: 10/03/2020 Elsevier Patient Education  [place]. Neck Exercises Ask your health care provider which exercises are safe for you. Do exercises exactly as told by your health care provider and adjust them as directed. It is normal to feel mild stretching, pulling, tightness, or discomfort as you do these exercises. Stop right away if you feel sudden pain or your pain gets worse. Do not begin these exercises until told by your health care provider. Neck exercises can be important for many reasons. They can improve strength and maintain flexibility in your neck, which will help your upper back and prevent neck pain. Stretching exercises Rotation neck stretching  Sit in a chair or stand up. Place your feet flat on the floor, shoulder-width apart. Slowly turn your head (rotate) to the right until a slight stretch is felt. Turn it all the way to the right so you can look over your right shoulder. Do not tilt or tip your  head. Hold this position for 10-30 seconds. Slowly turn your head (rotate) to the left until a slight stretch is felt. Turn it all the way to the left so you can look over your left shoulder. Do not tilt or tip your head. Hold this position for 10-30 seconds. Repeat __________ times. Complete this exercise __________ times a day. Neck retraction  Sit in a sturdy chair or stand up. Look straight ahead. Do not bend your neck. Use your fingers to push your chin backward (retraction). Do not bend your neck for this movement. Continue to face straight ahead. If you are doing the exercise properly, you will feel a slight sensation in your throat and a stretch at the back of your neck. Hold the stretch for 1-2 seconds. Repeat __________ times. Complete this exercise __________ times a day. Strengthening exercises Neck press  Lie on your back on a firm bed or on the floor with a pillow under your head. Use your neck muscles to push your head down on the pillow and straighten your spine. Hold the position as well as you can. Keep your head facing up (in a neutral position) and your chin tucked. Slowly count to 5 while holding this position. Repeat __________ times. Complete this exercise __________ times a day. Isometrics These are exercises in which you strengthen the muscles in your neck while keeping your neck still (isometrics). Sit in a supportive chair and place your hand on your forehead. Keep your head and face facing straight ahead. Do not flex or extend your neck while doing isometrics. Push forward with your head and neck while pushing back with your hand. Hold for 10 seconds. Do the sequence again, this time  putting your hand against the back of your head. Use your head and neck to push backward against the hand pressure. Finally, do the same exercise on either side of your head, pushing sideways against the pressure of your hand. Repeat __________ times. Complete this exercise  __________ times a day. Prone head lifts  Lie face-down (prone position), resting on your elbows so that your chest and upper back are raised. Start with your head facing downward, near your chest. Position your chin either on or near your chest. Slowly lift your head upward. Lift until you are looking straight ahead. Then continue lifting your head as far back as you can comfortably stretch. Hold your head up for 5 seconds. Then slowly lower it to your starting position. Repeat __________ times. Complete this exercise __________ times a day. Supine head lifts  Lie on your back (supine position), bending your knees to point to the ceiling and keeping your feet flat on the floor. Lift your head slowly off the floor, raising your chin toward your chest. Hold for 5 seconds. Repeat __________ times. Complete this exercise __________ times a day. Scapular retraction  Stand with your arms at your sides. Look straight ahead. Slowly pull both shoulders (scapulae) backward and downward (retraction) until you feel a stretch between your shoulder blades in your upper back. Hold for 10-30 seconds. Relax and repeat. Repeat __________ times. Complete this exercise __________ times a day. Contact a health care provider if: Your neck pain or discomfort gets worse when you do an exercise. Your neck pain or discomfort does not improve within 2 hours after you exercise. If you have any of these problems, stop exercising right away. Do not do the exercises again unless your health care provider says that you can. Get help right away if: You develop sudden, severe neck pain. If this happens, stop exercising right away. Do not do the exercises again unless your health care provider says that you can. This information is not intended to replace advice given to you by your health care provider. Make sure you discuss any questions you have with your health care provider. Document Revised: 01/10/2021 Document  Reviewed: 01/10/2021 Elsevier Patient Education  [place].

## 2022-06-06 ENCOUNTER — Other Ambulatory Visit (HOSPITAL_COMMUNITY): Payer: Self-pay

## 2022-06-12 ENCOUNTER — Ambulatory Visit: Payer: 59 | Admitting: Nurse Practitioner

## 2022-06-12 ENCOUNTER — Telehealth (HOSPITAL_COMMUNITY): Payer: 59 | Admitting: Psychiatry

## 2022-06-13 ENCOUNTER — Other Ambulatory Visit: Payer: Self-pay

## 2022-06-13 DIAGNOSIS — M25421 Effusion, right elbow: Secondary | ICD-10-CM

## 2022-06-13 DIAGNOSIS — M545 Low back pain, unspecified: Secondary | ICD-10-CM

## 2022-06-14 ENCOUNTER — Ambulatory Visit: Payer: 59 | Admitting: Nurse Practitioner

## 2022-06-15 ENCOUNTER — Telehealth (HOSPITAL_COMMUNITY): Payer: 59 | Admitting: Psychiatry

## 2022-06-20 ENCOUNTER — Telehealth (HOSPITAL_COMMUNITY): Payer: 59 | Admitting: Psychiatry

## 2022-06-23 ENCOUNTER — Other Ambulatory Visit (HOSPITAL_COMMUNITY): Payer: Self-pay

## 2022-06-25 ENCOUNTER — Encounter (HOSPITAL_COMMUNITY): Payer: Self-pay | Admitting: Psychiatry

## 2022-06-25 ENCOUNTER — Telehealth: Payer: Self-pay

## 2022-06-25 ENCOUNTER — Other Ambulatory Visit (HOSPITAL_COMMUNITY): Payer: Self-pay

## 2022-06-25 ENCOUNTER — Telehealth (HOSPITAL_BASED_OUTPATIENT_CLINIC_OR_DEPARTMENT_OTHER): Payer: 59 | Admitting: Psychiatry

## 2022-06-25 VITALS — Wt 194.0 lb

## 2022-06-25 DIAGNOSIS — F419 Anxiety disorder, unspecified: Secondary | ICD-10-CM

## 2022-06-25 DIAGNOSIS — F4310 Post-traumatic stress disorder, unspecified: Secondary | ICD-10-CM

## 2022-06-25 DIAGNOSIS — F331 Major depressive disorder, recurrent, moderate: Secondary | ICD-10-CM | POA: Diagnosis not present

## 2022-06-25 DIAGNOSIS — F4321 Adjustment disorder with depressed mood: Secondary | ICD-10-CM

## 2022-06-25 MED ORDER — OLANZAPINE-FLUOXETINE HCL 3-25 MG PO CAPS
1.0000 | ORAL_CAPSULE | Freq: Every evening | ORAL | 2 refills | Status: DC
  Filled 2022-06-25 – 2022-07-04 (×2): qty 30, 30d supply, fill #0
  Filled 2022-08-01 – 2022-08-16 (×2): qty 30, 30d supply, fill #1

## 2022-06-25 MED ORDER — ALPRAZOLAM 0.5 MG PO TABS
0.5000 mg | ORAL_TABLET | Freq: Every day | ORAL | 2 refills | Status: DC
  Filled 2022-06-25 – 2022-07-03 (×2): qty 40, 40d supply, fill #0
  Filled 2022-08-14: qty 40, 40d supply, fill #1

## 2022-06-25 MED ORDER — VORTIOXETINE HBR 10 MG PO TABS
10.0000 mg | ORAL_TABLET | Freq: Every day | ORAL | 2 refills | Status: DC
  Filled 2022-06-25 – 2022-07-03 (×2): qty 30, 30d supply, fill #0
  Filled 2022-08-01 – 2022-08-16 (×2): qty 30, 30d supply, fill #1

## 2022-06-25 NOTE — Telephone Encounter (Signed)
I left message on voicemail to call us back to give her xray results. They are scanned.
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
## 2022-06-26 ENCOUNTER — Other Ambulatory Visit (HOSPITAL_COMMUNITY): Payer: Self-pay

## 2022-06-26 ENCOUNTER — Encounter: Payer: Self-pay | Admitting: Nurse Practitioner

## 2022-07-02 ENCOUNTER — Other Ambulatory Visit: Payer: Self-pay | Admitting: Nurse Practitioner

## 2022-07-02 DIAGNOSIS — G894 Chronic pain syndrome: Secondary | ICD-10-CM

## 2022-07-03 ENCOUNTER — Other Ambulatory Visit: Payer: Self-pay | Admitting: Family Medicine

## 2022-07-03 ENCOUNTER — Other Ambulatory Visit (HOSPITAL_COMMUNITY): Payer: Self-pay

## 2022-07-03 DIAGNOSIS — E782 Mixed hyperlipidemia: Secondary | ICD-10-CM

## 2022-07-03 MED ORDER — WEGOVY 2.4 MG/0.75ML ~~LOC~~ SOAJ
2.4000 mg | SUBCUTANEOUS | 3 refills | Status: DC
  Filled 2022-07-03: qty 3, 28d supply, fill #0
  Filled 2022-08-01 – 2022-10-09 (×5): qty 3, 28d supply, fill #1

## 2022-07-04 ENCOUNTER — Other Ambulatory Visit (HOSPITAL_COMMUNITY): Payer: Self-pay

## 2022-07-11 ENCOUNTER — Other Ambulatory Visit: Payer: Self-pay

## 2022-07-11 DIAGNOSIS — G894 Chronic pain syndrome: Secondary | ICD-10-CM

## 2022-07-17 ENCOUNTER — Encounter: Payer: Self-pay | Admitting: Gastroenterology

## 2022-07-18 ENCOUNTER — Other Ambulatory Visit (INDEPENDENT_AMBULATORY_CARE_PROVIDER_SITE_OTHER): Payer: 59

## 2022-07-18 ENCOUNTER — Ambulatory Visit (HOSPITAL_BASED_OUTPATIENT_CLINIC_OR_DEPARTMENT_OTHER): Payer: 59

## 2022-07-18 ENCOUNTER — Ambulatory Visit (INDEPENDENT_AMBULATORY_CARE_PROVIDER_SITE_OTHER): Payer: 59 | Admitting: Gastroenterology

## 2022-07-18 ENCOUNTER — Other Ambulatory Visit: Payer: 59

## 2022-07-18 ENCOUNTER — Encounter: Payer: Self-pay | Admitting: Gastroenterology

## 2022-07-18 VITALS — BP 118/68 | HR 88 | Ht 64.0 in | Wt 196.0 lb

## 2022-07-18 DIAGNOSIS — K581 Irritable bowel syndrome with constipation: Secondary | ICD-10-CM

## 2022-07-18 DIAGNOSIS — R10819 Abdominal tenderness, unspecified site: Secondary | ICD-10-CM | POA: Diagnosis not present

## 2022-07-18 DIAGNOSIS — R1031 Right lower quadrant pain: Secondary | ICD-10-CM | POA: Diagnosis not present

## 2022-07-18 DIAGNOSIS — K625 Hemorrhage of anus and rectum: Secondary | ICD-10-CM

## 2022-07-18 LAB — COMPREHENSIVE METABOLIC PANEL
ALT: 12 U/L (ref 0–35)
AST: 15 U/L (ref 0–37)
Albumin: 4.4 g/dL (ref 3.5–5.2)
Alkaline Phosphatase: 41 U/L (ref 39–117)
BUN: 15 mg/dL (ref 6–23)
CO2: 27 mEq/L (ref 19–32)
Calcium: 9.2 mg/dL (ref 8.4–10.5)
Chloride: 105 mEq/L (ref 96–112)
Creatinine, Ser: 1.05 mg/dL (ref 0.40–1.20)
GFR: 62.54 mL/min (ref >60.00)
Glucose, Bld: 69 mg/dL — ABNORMAL LOW (ref 70–99)
Potassium: 3.8 mEq/L (ref 3.5–5.1)
Sodium: 139 mEq/L (ref 135–145)
Total Bilirubin: 0.4 mg/dL (ref 0.2–1.2)
Total Protein: 7.1 g/dL (ref 6.0–8.3)

## 2022-07-18 LAB — LIPASE: Lipase: 29 U/L (ref 11.0–59.0)

## 2022-07-18 LAB — CBC WITH DIFFERENTIAL/PLATELET
Basophils Absolute: 0 10*3/uL (ref 0.0–0.1)
Basophils Relative: 0.7 % (ref 0.0–3.0)
Eosinophils Absolute: 0.1 10*3/uL (ref 0.0–0.7)
Eosinophils Relative: 2.8 % (ref 0.0–5.0)
HCT: 36.8 % (ref 36.0–46.0)
Hemoglobin: 12.2 g/dL (ref 12.0–15.0)
Lymphocytes Relative: 44.5 % (ref 12.0–46.0)
Lymphs Abs: 1.8 10*3/uL (ref 0.7–4.0)
MCHC: 33.2 g/dL (ref 30.0–36.0)
MCV: 88.5 fl (ref 78.0–100.0)
Monocytes Absolute: 0.3 10*3/uL (ref 0.1–1.0)
Monocytes Relative: 6.6 % (ref 3.0–12.0)
Neutro Abs: 1.8 10*3/uL (ref 1.4–7.7)
Neutrophils Relative %: 45.4 % (ref 43.0–77.0)
Platelets: 216 10*3/uL (ref 150.0–400.0)
RBC: 4.16 Mil/uL (ref 3.87–5.11)
RDW: 13.6 % (ref 11.5–15.5)
WBC: 4 10*3/uL (ref 4.0–10.5)

## 2022-07-18 LAB — C-REACTIVE PROTEIN: CRP: <1 mg/dL (ref 0.5–20.0)

## 2022-07-18 NOTE — Progress Notes (Signed)
Chief Complaint: GI problems.  Referring Provider:  Rip Harbour, NP      ASSESSMENT AND PLAN;   #1. RLQ pain with tenderness. R/O appendicitis   #2. GERD with eso dysphagia (resolved)  #2. IBS-C with rectal bleeding.   Plan: -CBC, CMP, lipase, CRP -CT AP with constarst ASAP -For now, continue senna 4/day -FU thereafter.    HPI:    Desiree Russell is a 51 y.o. female  With H/O CVA (on ASA/plavix), fibromyalgia, HLD, anxiety, migraines, hypothyroidism, H/O ectopic pregnancy s/p R salpingo-oophorectomy with LOA, hysterectomy, cholecystectomy  RLQ pain x 3 days, getting worse With bloating Feels weak With some fever without chills. Rectal bleeding - mixed with the stool x 3 days. Rectal exam today was negative  Further dysphagia ever since dilatation.  No nausea or vomiting.  Constipation is overall better with senna 4/day.    From previous notes: Has longstanding history of constipation "all my life" and has been on senna for over 4-5 years.  She takes 3/day.  She does pass pellet-like stools.  Also associated abdominal bloating, lower abdominal crampy pain which gets better with defecation.  No weight loss or loss of appetite.  She does take calcium supplements.  No significant nonsteroidals.  She previously has been on narcotics which has been stopped over the last 1 year.  No sodas, chocolates, chewing gums, artificial sweeteners and candy.     Past GI work-up:  EGD 08/10/2021 - No endoscopic esophageal abnormality to explain patient's dysphagia. Esophagus dilated. Dilated. Bx- neg for EoE - Gastritis. Bx: neg for HP  Colonoscopy 08/10/2021 - One 6 mm polyp in the distal transverse colon, removed with a cold snare. Resected and retrieved.Bx- TA. Rpt in 7 yrs - Mild melanosis coli. - Minimal sigmoid diverticulosis. - Non-bleeding internal hemorrhoids. - The examined portion of the ileum was normal. - The examination was otherwise normal on direct  and retroflexion views.  CT AP with contrast 03/03/2022 1. There is focal bowel wall thickening of the third portion of the duodenum. This is nonspecific and could reflect a nonspecific duodenitis. Consider correlation with endoscopy if not previously performed. 2. Normal appendix.  CT AP without contrast 03/02/2022 IMPRESSION: 1. No acute findings in the abdomen or pelvis. 2. Trace free fluid in the pelvis. This is probably physiologic in a premenopausal female.   EGD 09/2012 -Gastric ulcer. Bx- neg for HP or malignancy -Mild gastritis  Colonoscopy12/2013 -Fair to poor prep -Mild melanosis coli -Highly redundant.  SH- Therapist, sports from [employer]. Mom, older sister with significant constipation  Past Medical History:  Diagnosis Date   Allergy    Anemia    Anxiety    Arthritis    Dyslipidemia    Fibromyalgia    GERD (gastroesophageal reflux disease)    Headache    Migraines    Pericarditis    Stroke ([place]) 12/2020   Tachycardia    TIA (transient ischemic attack)    Recurrent    Past Surgical History:  Procedure Laterality Date   ABDOMINAL HYSTERECTOMY     CHOLECYSTECTOMY     CHOLECYSTECTOMY, LAPAROSCOPIC     COLONOSCOPY     UPPER GASTROINTESTINAL ENDOSCOPY      Family History  Problem Relation Age of Onset   Hypertension Mother    Hypertension Father    Heart attack Father 88   Healthy Sister    Healthy Sister    Healthy Sister    Hypertension Brother    Diabetes type II  Brother    COPD Brother    Healthy Brother    Healthy Brother    Breast cancer Maternal Aunt    Throat cancer Maternal Uncle    Hypertension Maternal Grandmother    Diabetes Paternal Geophysicist/field seismologist    Healthy Daughter    Healthy Son    Healthy Son    Healthy Son    Colon cancer Neg Hx    Pancreatic cancer Neg Hx    Stomach cancer Neg Hx    Liver disease Neg Hx    Esophageal cancer Neg Hx    Rectal cancer Neg Hx     Social History   Tobacco Use   Smoking status: Never   Smokeless  tobacco: Never  Vaping Use   Vaping Use: Never used  Substance Use Topics   Alcohol use: Yes    Comment: occ    Drug use: No    Current Outpatient Medications  Medication Sig Dispense Refill   acetaminophen (TYLENOL) 325 MG tablet Take 650 mg by mouth every 6 (six) hours as needed for mild pain, fever or headache.     ALPRAZolam (XANAX) 0.5 MG tablet Take 1 tablet (0.5 mg total) by mouth at bedtime. 40 tablet 2   aspirin 81 MG EC tablet Take 1 tablet (81 mg total) by mouth daily. 21 tablet 0   Calcium-Vitamin D-Vitamin K 650-12.5-40 MG-MCG-MCG CHEW Chew 1 tablet by mouth daily.     clopidogrel (PLAVIX) 75 MG tablet Take 1 tablet (75 mg total) by mouth once daily 90 tablet 2   cyclobenzaprine (FLEXERIL) 10 MG tablet Take 0.5-1 tablets (5-10 mg total) by mouth at bedtime as needed for muscle spasms. 30 tablet 0   gabapentin (NEURONTIN) 300 MG capsule Take 3 capsules by mouth at bedtime. 270 capsule 3   gabapentin (NEURONTIN) 600 MG tablet Take 1 tablet  by mouth at bedtime. 90 tablet 1   gabapentin (NEURONTIN) 600 MG tablet Take by mouth.     Galcanezumab-gnlm (EMGALITY) 120 MG/ML SOAJ Inject '120mg'$  once monthly 3 mL 1   Galcanezumab-gnlm 120 MG/ML SOAJ Inject 120 mg into the skin every 28 days. 3 mL 1   loratadine (CLARITIN) 10 MG tablet Take 1 tablet (10 mg total) by mouth daily. 90 tablet 1   metoprolol succinate (TOPROL-XL) 25 MG 24 hr tablet Take 1 tablet by mouth daily. 90 tablet 3   Multiple Vitamin (MULTIVITAMIN WITH MINERALS) TABS tablet Take 1 tablet by mouth daily.     OLANZapine-FLUoxetine (SYMBYAX) 3-25 MG capsule Take 1 capsule by mouth every evening. 30 capsule 2   ondansetron (ZOFRAN) 4 MG tablet Take 1 tablet by mouth every 8 hours as needed for nausea or vomiting. 30 tablet 3   pantoprazole (PROTONIX) 40 MG tablet Take 1 tablet by mouth daily. 90 tablet 1   rosuvastatin (CRESTOR) 5 MG tablet Take 1 tablet (5 mg total) by mouth daily. 90 tablet 0   Semaglutide-Weight  Management (WEGOVY) 2.4 MG/0.75ML SOAJ Inject 2.4 mg into the skin once a week. 3 mL 3   senna-docusate (SENOKOT-S) 8.6-50 MG tablet Take 2 tablets by mouth daily. 90 tablet 1   Ubrogepant (UBRELVY) 100 MG TABS TAKE  1/2 TABLET BY MOUTH AT HEADACHE ONSET. MAY REPEAT DOSE IN TWO HOURS IF NO IMPROVEMENT. DO NOT EXCEED MORE THAN TWO TABLETS IN 24 HOURS. 18 tablet 1   vortioxetine HBr (TRINTELLIX) 10 MG TABS tablet Take 1 tablet by mouth daily. 30 tablet 2   furosemide (LASIX) 20 MG  tablet Take 1 tablet by mouth daily as needed. 90 tablet 3   No current facility-administered medications for this visit.    Allergies  Allergen Reactions   Codeine Hives and Rash   Morphine Hives and Other (See Comments)    Headaches, also   Morphine And Related Hives   Chlorhexidine Hives and Rash         Review of Systems:  neg     Physical Exam:    BP 118/68 (BP Location: Left Arm, Patient Position: Sitting, Cuff Size: Normal)   Pulse 88   Ht '5\' 4"'$  (1.626 m)   Wt 196 lb (88.9 kg)   SpO2 97%   BMI 33.64 kg/m  Wt Readings from Last 3 Encounters:  07/18/22 196 lb (88.9 kg)  06/05/22 198 lb (89.8 kg)  05/11/22 206 lb (93.4 kg)   Constitutional:  Well-developed, in no acute distress. Psychiatric: Normal mood and affect. Behavior is normal. HEENT: Pupils normal.  Conjunctivae are normal. No scleral icterus. Cardiovascular: Normal rate, regular rhythm. No edema Pulmonary/chest: Effort normal and breath sounds normal. No wheezing, rales or rhonchi. Abdominal: Soft, nondistended. RLQ tenderness without rebound Bowel sounds active throughout. There are no masses palpable. No hepatomegaly. Rectal: No obvious rectal abnormalities except for small hemorrhoids.  Brown stool, heme neg, in presence of [place] Neurological: Alert and oriented to person place and time. Skin: Skin is warm and dry. No rashes noted.  Data Reviewed: I have personally reviewed following labs and imaging studies  CBC:     Latest Ref Rng & Units 05/11/2022    8:13 AM 03/13/2022   11:26 AM 03/03/2022   10:36 AM  CBC  WBC 3.4 - 10.8 x10E3/uL 4.4  4.4  4.4   Hemoglobin 11.1 - 15.9 g/dL 12.3  12.4  12.2   Hematocrit 34.0 - 46.6 % 38.8  38.6  37.4   Platelets 150 - 450 x10E3/uL 224  235  211     CMP:    Latest Ref Rng & Units 05/11/2022    8:13 AM 03/13/2022   11:26 AM 03/03/2022   10:36 AM  CMP  Glucose 70 - 99 mg/dL 74  81  89   BUN 6 - 24 mg/dL '12  9  11   '$ Creatinine 0.57 - 1.00 mg/dL 1.13  1.05  1.05   Sodium 134 - 144 mmol/L 139  140  138   Potassium 3.5 - 5.2 mmol/L 4.6  4.5  4.6   Chloride 96 - 106 mmol/L 104  103  108   CO2 20 - 29 mmol/L '23  23  25   '$ Calcium 8.7 - 10.2 mg/dL 9.6  9.7  9.5   Total Protein 6.0 - 8.5 g/dL 6.9  7.1  7.5   Total Bilirubin 0.0 - 1.2 mg/dL 0.3  <0.2  0.8   Alkaline Phos 44 - 121 IU/L 45  53  34   AST 0 - 40 IU/L '17  17  21   '$ ALT 0 - 32 IU/L '13  15  16         '$ Carmell Austria, MD 07/18/2022, 10:05 AM  Cc: Rip Harbour, NP

## 2022-07-18 NOTE — Patient Instructions (Addendum)
_______________________________________________________  If you are age 49 or older, your body mass index should be between 23-30. Your Body mass index is 33.64 kg/m. If this is out of the aforementioned range listed, please consider follow up with your Primary Care Provider.  If you are age 64 or younger, your body mass index should be between 19-25. Your Body mass index is 33.64 kg/m. If this is out of the aformentioned range listed, please consider follow up with your Primary Care Provider.   ________________________________________________________  The [facility] providers would like to encourage you to use MYCHART to communicate with providers for non-urgent requests or questions.  Due to long hold times on the telephone, sending your provider a message by MYCHART may be a faster and more efficient way to get a response.  Please allow 48 business hours for a response.  Please remember that this is for non-urgent requests.  _______________________________________________________  Your provider has requested that you go to the basement level for lab work before leaving today. Press "B" on the elevator. The lab is located at the first door on the left as you exit the elevator.  Continue senna 4 a day   You have been scheduled for a CT scan of the abdomen and pelvis at [facility] at [address] ([address], [place], [place] [address]) You are scheduled on 07-20-2023 at 7pm.  Arrive by 5pm to the [facility] location. Please follow the written instructions below on the day of your exam:  WARNING: IF YOU ARE ALLERGIC TO IODINE/X-RAY DYE, PLEASE NOTIFY RADIOLOGY IMMEDIATELY AT [phone]! YOU WILL BE GIVEN A 13 HOUR PREMEDICATION PREP.  1) Do not eat or drink anything after 3pm (4 hours prior to your test) 2) You will be given 2 bottles of oral contrast to drink.   You may take any medications as prescribed with a small amount of water, if necessary. If you  take any of the following medications: METFORMIN, GLUCOPHAGE, GLUCOVANCE, AVANDAMET, RIOMET, FORTAMET, ACTOPLUS MET, JANUMET, GLUMETZA or METAGLIP, you MAY be asked to HOLD this medication 48 hours AFTER the exam.  The purpose of you drinking the oral contrast is to aid in the visualization of your intestinal tract. The contrast solution may cause some diarrhea. Depending on your individual set of symptoms, you may also receive an intravenous injection of x-ray contrast/dye. Plan on being at [facility] for 30 minutes or longer, depending on the type of exam you are having performed.  This test typically takes 30-45 minutes to complete.  If you have any questions regarding your exam or if you need to reschedule, you may call the CT department at [phone] between the hours of 8:00 am and 5:00 pm, Monday-Friday.  ________________________________________________________________________  Thank you,  Dr. Rajesh Gupta  

## 2022-07-19 ENCOUNTER — Ambulatory Visit (HOSPITAL_BASED_OUTPATIENT_CLINIC_OR_DEPARTMENT_OTHER)
Admission: RE | Admit: 2022-07-19 | Discharge: 2022-07-19 | Disposition: A | Discharge status: Home / Self Care | Payer: 59 | Source: Ambulatory Visit | Attending: Gastroenterology | Admitting: Gastroenterology

## 2022-07-19 DIAGNOSIS — R1031 Right lower quadrant pain: Secondary | ICD-10-CM | POA: Diagnosis not present

## 2022-07-19 DIAGNOSIS — R10819 Abdominal tenderness, unspecified site: Secondary | ICD-10-CM | POA: Insufficient documentation

## 2022-07-19 DIAGNOSIS — K581 Irritable bowel syndrome with constipation: Secondary | ICD-10-CM | POA: Diagnosis not present

## 2022-07-19 MED ORDER — IOHEXOL 300 MG/ML  SOLN
100.0000 mL | Freq: Once | INTRAMUSCULAR | Status: AC | PRN
  Administered 2022-07-19: 80 mL via INTRAVENOUS

## 2022-08-01 ENCOUNTER — Encounter: Payer: Self-pay | Admitting: Nurse Practitioner

## 2022-08-02 ENCOUNTER — Other Ambulatory Visit: Payer: Self-pay

## 2022-08-02 ENCOUNTER — Other Ambulatory Visit: Payer: Self-pay | Admitting: Gastroenterology

## 2022-08-02 ENCOUNTER — Other Ambulatory Visit (HOSPITAL_COMMUNITY): Payer: Self-pay

## 2022-08-02 DIAGNOSIS — K2100 Gastro-esophageal reflux disease with esophagitis, without bleeding: Secondary | ICD-10-CM

## 2022-08-02 MED ORDER — PANTOPRAZOLE SODIUM 40 MG PO TBEC
40.0000 mg | DELAYED_RELEASE_TABLET | Freq: Every day | ORAL | 3 refills | Status: DC
  Filled 2022-08-02 – 2022-09-26 (×2): qty 90, 90d supply, fill #0
  Filled 2022-12-26: qty 90, 90d supply, fill #1
  Filled 2023-04-02: qty 90, 90d supply, fill #2
  Filled 2023-06-30: qty 90, 90d supply, fill #3

## 2022-08-03 ENCOUNTER — Other Ambulatory Visit (HOSPITAL_COMMUNITY): Payer: Self-pay

## 2022-08-03 ENCOUNTER — Other Ambulatory Visit: Payer: Self-pay

## 2022-08-03 ENCOUNTER — Encounter: Payer: Self-pay | Admitting: Nurse Practitioner

## 2022-08-04 ENCOUNTER — Other Ambulatory Visit (HOSPITAL_COMMUNITY): Payer: Self-pay

## 2022-08-11 ENCOUNTER — Other Ambulatory Visit (HOSPITAL_COMMUNITY): Payer: Self-pay

## 2022-08-13 ENCOUNTER — Other Ambulatory Visit (HOSPITAL_COMMUNITY): Payer: Self-pay

## 2022-08-14 ENCOUNTER — Other Ambulatory Visit: Payer: Self-pay | Admitting: Nurse Practitioner

## 2022-08-14 MED ORDER — ROSUVASTATIN CALCIUM 5 MG PO TABS
5.0000 mg | ORAL_TABLET | Freq: Every day | ORAL | 0 refills | Status: DC
  Filled 2022-08-14: qty 90, 90d supply, fill #0

## 2022-08-15 ENCOUNTER — Other Ambulatory Visit: Payer: Self-pay

## 2022-08-15 ENCOUNTER — Other Ambulatory Visit (HOSPITAL_COMMUNITY): Payer: Self-pay

## 2022-08-16 ENCOUNTER — Other Ambulatory Visit: Payer: Self-pay

## 2022-08-16 ENCOUNTER — Other Ambulatory Visit (HOSPITAL_COMMUNITY): Payer: Self-pay

## 2022-08-16 ENCOUNTER — Telehealth (HOSPITAL_COMMUNITY): Payer: Self-pay | Admitting: *Deleted

## 2022-08-16 ENCOUNTER — Ambulatory Visit: Payer: 59 | Admitting: Nurse Practitioner

## 2022-08-16 ENCOUNTER — Encounter: Payer: Self-pay | Admitting: Nurse Practitioner

## 2022-08-16 VITALS — BP 126/82 | HR 85 | Temp 97.6°F | Ht 64.0 in | Wt 193.0 lb

## 2022-08-16 DIAGNOSIS — E559 Vitamin D deficiency, unspecified: Secondary | ICD-10-CM | POA: Diagnosis not present

## 2022-08-16 DIAGNOSIS — R7989 Other specified abnormal findings of blood chemistry: Secondary | ICD-10-CM | POA: Diagnosis not present

## 2022-08-16 DIAGNOSIS — K5909 Other constipation: Secondary | ICD-10-CM

## 2022-08-16 DIAGNOSIS — Z6833 Body mass index (BMI) 33.0-33.9, adult: Secondary | ICD-10-CM

## 2022-08-16 DIAGNOSIS — E785 Hyperlipidemia, unspecified: Secondary | ICD-10-CM | POA: Diagnosis not present

## 2022-08-16 DIAGNOSIS — Z7901 Long term (current) use of anticoagulants: Secondary | ICD-10-CM | POA: Diagnosis not present

## 2022-08-16 DIAGNOSIS — I471 Supraventricular tachycardia, unspecified: Secondary | ICD-10-CM | POA: Diagnosis not present

## 2022-08-16 DIAGNOSIS — E6609 Other obesity due to excess calories: Secondary | ICD-10-CM

## 2022-08-16 DIAGNOSIS — K219 Gastro-esophageal reflux disease without esophagitis: Secondary | ICD-10-CM

## 2022-08-16 MED ORDER — TRULANCE 3 MG PO TABS: 3.0000 mg | ORAL_TABLET | Freq: Every day | ORAL | 0 refills | Status: DC

## 2022-08-16 NOTE — Telephone Encounter (Signed)
Pt called stating that with insurance change this year she cannot afford co-pay for the Symbyax ($400). Pt asked if you would send separate scripts for olanzapine and Prozac, which would be more affordable for her. Pt next scheduled appointment is on 09/18/22. Please review and advise.

## 2022-08-16 NOTE — Telephone Encounter (Signed)
It is okay to send 2 separate prescription.

## 2022-08-16 NOTE — Progress Notes (Signed)
Subjective:  Patient ID: Desiree Russell, female    DOB: 29-Oct-1970  Age: 52 y.o. MRN: [id number]  Chief Complaint  Patient presents with   Hyperlipidemia   Gastroesophageal Reflux    HPI  Pt presents for follow-up of chronic medical problems of hyperlipidemia, SVT, and GERD. Pt was prescribed Wegovy to assist with weight management. She tells me her insurance is no longer covering Wegovy, states she has hit a plateau with weight loss goal. Current weight 193 lbs, BMI 33.13. States she has continued heart healthy diet. Reports she temporarily stopped exercising for a few weeks due to shoulder pain. She has been referred to [facility] for chronic pain syndrome. Desiree Russell was found to have low TSH 0.44 per labs on 05/11/22, T4 normal. Recommend referral to endocrinology for evaluation of hyperthyroidism. Per EMR review, pt has appt with [facility] April 5th 2024 with Dr Kelton Pillar.  Will repeat labs today in office.   Desiree Russell reports chronic constipation. States she has tried OTC stool softeners, fiber supplements, and high fiber diet. She is followed by Dr Lyndel Safe, GI specialist. EMR review reveals highly redundant colon and  abd surgeries x 2.    SVT, follow-up: Desiree Russell has a history of SVT for several years. Current treatment includes Metoprolol. Denies palpitations or near syncope. She was last seen for SVT 6 months ago.She is followed by Dr Warren Lacy for cardiology. States symptoms are currently well controlled. Denies side effects of medication. Pt has a history of pericarditis in 2022 related to flu.   BP at that visit was 130/8 She reports excellent compliance with treatment. She is not having side effects.  She is following a Regular diet. She is not exercising. She does not smoke.    Lipid/Cholesterol, Follow-up  Last lipid panel Other pertinent labs  Lab Results  Component Value Date   CHOL 128 05/11/2022   HDL 47 05/11/2022   LDLCALC 68 05/11/2022   TRIG 61  05/11/2022   CHOLHDL 2.7 05/11/2022   Lab Results  Component Value Date   ALT 12 07/18/2022   AST 15 07/18/2022   PLT 216.0 07/18/2022   TSH 0.444 (L) 05/11/2022     She was last seen for this 3 months ago.  Management includes Crestor 5 mg  She reports excellent compliance with treatment. She is not having side effects.  Current diet: in general, a "healthy" diet     GERD, Follow up:  Current treatment consist of: Protonix 40 mg QD She reports excellent compliance with treatment. She is not having side effects. . She is NOT experiencing belching and eructation  Vitamin D deficiency, follow-up  Lab Results  Component Value Date   VD25OH 39.8 12/20/2021   VD25OH 36.7 01/11/2021   CALCIUM 9.2 07/18/2022   CALCIUM 9.6 05/11/2022   Wt Readings from Last 3 Encounters:  08/16/22 193 lb (87.5 kg)  07/18/22 196 lb (88.9 kg)  06/05/22 198 lb (89.8 kg)    She was last seen for vitamin D deficiency 3 months ago.  Management since that visit includes Calcium and Vit D supplement  She reports excellent compliance with treatment. She is not having side effects.    ---------------------------------------------------------------------------------------------------   Current Outpatient Medications on File Prior to Visit  Medication Sig Dispense Refill   acetaminophen (TYLENOL) 325 MG tablet Take 650 mg by mouth every 6 (six) hours as needed for mild pain, fever or headache.     ALPRAZolam (XANAX) 0.5 MG tablet Take 1 tablet (0.5 mg  total) by mouth at bedtime. 40 tablet 2   aspirin 81 MG EC tablet Take 1 tablet (81 mg total) by mouth daily. 21 tablet 0   Calcium-Vitamin D-Vitamin K 650-12.5-40 MG-MCG-MCG CHEW Chew 1 tablet by mouth daily.     clopidogrel (PLAVIX) 75 MG tablet Take 1 tablet (75 mg total) by mouth once daily 90 tablet 2   cyclobenzaprine (FLEXERIL) 10 MG tablet Take 0.5-1 tablets (5-10 mg total) by mouth at bedtime as needed for muscle spasms. 30 tablet 0    furosemide (LASIX) 20 MG tablet Take 1 tablet by mouth daily as needed. 90 tablet 3   gabapentin (NEURONTIN) 300 MG capsule Take 3 capsules by mouth at bedtime. 270 capsule 3   gabapentin (NEURONTIN) 600 MG tablet Take 1 tablet  by mouth at bedtime. 90 tablet 1   Galcanezumab-gnlm 120 MG/ML SOAJ Inject 120 mg into the skin every 28 days. 3 mL 1   loratadine (CLARITIN) 10 MG tablet Take 1 tablet (10 mg total) by mouth daily. 90 tablet 1   metoprolol succinate (TOPROL-XL) 25 MG 24 hr tablet Take 1 tablet by mouth daily. 90 tablet 3   Multiple Vitamin (MULTIVITAMIN WITH MINERALS) TABS tablet Take 1 tablet by mouth daily.     OLANZapine-FLUoxetine (SYMBYAX) 3-25 MG capsule Take 1 capsule by mouth every evening. 30 capsule 2   ondansetron (ZOFRAN) 4 MG tablet Take 1 tablet by mouth every 8 hours as needed for nausea or vomiting. 30 tablet 3   pantoprazole (PROTONIX) 40 MG tablet Take 1 tablet by mouth daily. 90 tablet 3   rosuvastatin (CRESTOR) 5 MG tablet Take 1 tablet (5 mg total) by mouth daily. 90 tablet 0   Semaglutide-Weight Management (WEGOVY) 2.4 MG/0.75ML SOAJ Inject 2.4 mg into the skin once a week. 3 mL 3   senna-docusate (SENOKOT-S) 8.6-50 MG tablet Take 2 tablets by mouth daily. 90 tablet 1   Ubrogepant (UBRELVY) 100 MG TABS TAKE  1/2 TABLET BY MOUTH AT HEADACHE ONSET. MAY REPEAT DOSE IN TWO HOURS IF NO IMPROVEMENT. DO NOT EXCEED MORE THAN TWO TABLETS IN 24 HOURS. 18 tablet 1   vortioxetine HBr (TRINTELLIX) 10 MG TABS tablet Take 1 tablet by mouth daily. 30 tablet 2   No current facility-administered medications on file prior to visit.   Past Medical History:  Diagnosis Date   Allergy    Anemia    Anxiety    Arthritis    Dyslipidemia    Fibromyalgia    GERD (gastroesophageal reflux disease)    Headache    Migraines    Pericarditis    Stroke ([place]) 12/2020   Tachycardia    TIA (transient ischemic attack)    Recurrent   Past Surgical History:  Procedure Laterality Date    ABDOMINAL HYSTERECTOMY     CHOLECYSTECTOMY     CHOLECYSTECTOMY, LAPAROSCOPIC     COLONOSCOPY     UPPER GASTROINTESTINAL ENDOSCOPY      Family History  Problem Relation Age of Onset   Hypertension Mother    Hypertension Father    Heart attack Father 33   Healthy Sister    Healthy Sister    Healthy Sister    Hypertension Brother    Diabetes type II Brother    COPD Brother    Healthy Brother    Healthy Brother    Breast cancer Maternal Aunt    Throat cancer Maternal Uncle    Hypertension Maternal Grandmother    Diabetes Paternal Grandmother  Healthy Daughter    Healthy Son    Healthy Son    Healthy Son    Colon cancer Neg Hx    Pancreatic cancer Neg Hx    Stomach cancer Neg Hx    Liver disease Neg Hx    Esophageal cancer Neg Hx    Rectal cancer Neg Hx    Social History   Socioeconomic History   Marital status: Married    Spouse name: Not on file   Number of children: 4   Years of education: Not on file   Highest education level: Not on file  Occupational History   Not on file  Tobacco Use   Smoking status: Never   Smokeless tobacco: Never  Vaping Use   Vaping Use: Never used  Substance and Sexual Activity   Alcohol use: Yes    Comment: occ    Drug use: No   Sexual activity: Not on file  Other Topics Concern   Not on file  Social History Narrative   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     Social Determinants of Health   Financial Resource Strain: Low Risk  (12/20/2021)   Overall Financial Resource Strain (CARDIA)    Difficulty of Paying Living Expenses: Not hard at all  Food Insecurity: No Food Insecurity (12/20/2021)   Hunger Vital Sign    Worried About Running Out of Food in the Last Year: Never true    Ran Out of Food in the Last Year: Never true  Transportation Needs: No Transportation Needs (12/20/2021)   PRAPARE - Hydrologist (Medical): No    Lack of Transportation (Non-Medical): No  Physical  Activity: Sufficiently Active (12/20/2021)   Exercise Vital Sign    Days of Exercise per Week: 5 days    Minutes of Exercise per Session: 60 min  Stress: No Stress Concern Present (12/20/2021)   [place]    Feeling of Stress : Not at all  Social Connections: Accident (12/20/2021)   Social Connection and Isolation Panel [NHANES]    Frequency of Communication with Friends and Family: More than three times a week    Frequency of Social Gatherings with Friends and Family: More than three times a week    Attends Religious Services: More than 4 times per year    Active Member of [employer] or Organizations: Yes    Attends Music therapist: More than 4 times per year    Marital Status: Married    Review of Systems  Constitutional:  Negative for chills, fatigue and fever.  HENT:  Negative for congestion, ear pain, rhinorrhea and sore throat.   Respiratory:  Negative for cough and shortness of breath.   Cardiovascular:  Negative for chest pain.  Gastrointestinal:  Positive for constipation (Stool softeners not helping). Negative for abdominal pain, diarrhea, nausea and vomiting.  Genitourinary:  Negative for dysuria and urgency.  Musculoskeletal:  Positive for arthralgias. Negative for back pain and myalgias.  Neurological:  Negative for dizziness, weakness, light-headedness and headaches.  Psychiatric/Behavioral:  Negative for dysphoric mood. The patient is not nervous/anxious.      Objective:  BP 126/82   Pulse 85   Temp 97.6 F (36.4 C)   Ht '5\' 4"'$  (1.626 m)   Wt 193 lb (87.5 kg)   SpO2 99%   BMI 33.13 kg/m       08/16/2022    7:41  AM 07/18/2022   10:02 AM 06/25/2022    4:31 PM  BP/Weight  Systolic BP  741   Diastolic BP  68   Wt. (Lbs) 193 196   BMI 33.13 kg/m2 33.64 kg/m2      Information is confidential and restricted. Go to Review Flowsheets to unlock data.    Physical Exam Vitals  reviewed.  Constitutional:      Appearance: Normal appearance.  HENT:     Right Ear: Tympanic membrane normal.     Left Ear: Tympanic membrane normal.     Nose: Nose normal.     Mouth/Throat:     Mouth: Mucous membranes are moist.  Cardiovascular:     Rate and Rhythm: Normal rate and regular rhythm.     Pulses: Normal pulses.     Heart sounds: Normal heart sounds.  Pulmonary:     Effort: Pulmonary effort is normal.     Breath sounds: Normal breath sounds.  Abdominal:     General: Bowel sounds are normal.     Palpations: Abdomen is soft.  Skin:    General: Skin is warm and dry.     Capillary Refill: Capillary refill takes less than 2 seconds.  Neurological:     General: No focal deficit present.     Mental Status: She is alert and oriented to person, place, and time.  Psychiatric:        Mood and Affect: Mood normal.        Behavior: Behavior normal.       Lab Results  Component Value Date   WBC 4.0 07/18/2022   HGB 12.2 07/18/2022   HCT 36.8 07/18/2022   PLT 216.0 07/18/2022   GLUCOSE 69 (L) 07/18/2022   CHOL 128 05/11/2022   TRIG 61 05/11/2022   HDL 47 05/11/2022   LDLCALC 68 05/11/2022   ALT 12 07/18/2022   AST 15 07/18/2022   NA 139 07/18/2022   K 3.8 07/18/2022   CL 105 07/18/2022   CREATININE 1.05 07/18/2022   BUN 15 07/18/2022   CO2 27 07/18/2022   TSH 0.444 (L) 05/11/2022   INR 1.1 03/10/2021   HGBA1C 5.5 07/14/2021      Assessment & Plan:   1. Dyslipidemia-well controlled - CBC with Differential/Platelet - Comprehensive metabolic panel - Lipid panel -continue Crestor 5 mg QD  2. Vitamin D deficiency-well controlled - VITAMIN D 25 Hydroxy (Vit-D Deficiency, Fractures) -continue Vit D rich diet -continue calcium Vit D supplement OTC  3. Low TSH level - T4, free - TSH -pt awaiting endocrinology appt 11/02/22 with [facility]  4. Long term current use of anticoagulant - CBC with Differential/Platelet - Comprehensive metabolic  panel -continue Plavix 75 mg QD  5. Chronic constipation - CBC with Differential/Platelet - Comprehensive metabolic panel - Plecanatide (TRULANCE) 3 MG TABS; Take 3 mg by mouth daily in the afternoon.  Dispense: 12 tablet; Refill: 0, samples given -high fiber diet  6. Paroxysmal SVT (supraventricular tachycardia) - CBC with Differential/Platelet - Comprehensive metabolic panel - T4, free - TSH  7. Gastroesophageal reflux disease, unspecified whether esophagitis present - CBC with Differential/Platelet - Comprehensive metabolic panel  8. Class 1 obesity due to excess calories with serious comorbidity and body mass index (BMI) of 33.0 to 33.9 in adult - CBC with Differential/Platelet - Comprehensive metabolic panel - Lipid panel - VITAMIN D 25 Hydroxy (Vit-D Deficiency, Fractures) - TSH   Begin Trulance 3 mg daily High fiber diet We will call you with lab results  Follow-up in 6 months   Follow-up: 43-month fasting with SAdron Bene PA per pt request  An After Visit Summary was printed and given to the patient.  I, SRip Harbour NP, have reviewed all documentation for this visit. The documentation on 08/16/22 for the exam, diagnosis, procedures, and orders are all accurate and complete.   Signed, SRip Harbour NP [phone]

## 2022-08-16 NOTE — Patient Instructions (Signed)
Begin Trulance 3 mg daily High fiber diet We will call you with lab results Follow-up in 6 months   Chronic Constipation Chronic constipation is a condition in which a person has three or fewer bowel movements a week, for 3 months or longer. This condition is especially common in older adults. What are the causes? Causes of chronic constipation may include: Not drinking enough fluid, eating enough food or fiber, or getting enough physical activity. Pregnancy. A tear in the anus (anal fissure). Blockage in the bowel (bowel obstruction). Narrowing of the bowel (bowel stricture). Having a long-term medical condition, such as: Diabetes, hypothyroidism, or iron-deficiency anemia. Stroke or spinal cord injury. Multiple sclerosis or Parkinson's disease. Colon cancer. Dementia. Inflammatory bowel disease (IBD), outward collapse of the rectum (rectal prolapse), or hemorrhoids. Taking certain medicines, including: Narcotics. These are a certain type of prescription pain medicine. Antacids or iron supplements. Water pills (diuretics). Certain blood pressure medicines. Anti-seizure medicines. Antidepressants. Medicines for Parkinson's disease. Other causes of this condition may include: Stress. Problems in the nerves and muscles that control the movement of stool. Weak or impaired pelvic floor muscles. What increases the risk? You may be at higher risk for chronic constipation if: You are older than age 3. You are female. You live in a long-term care facility. You have a long-term disease. You have a mental health disorder or eating disorder. What are the signs or symptoms? The main symptom of chronic constipation is having three or fewer bowel movements a week for several weeks. Other signs and symptoms may vary from person to person. These include: Pushing hard (straining) to pass stool, or having hard or lumpy stools. Painful bowel movements. Having lower abdominal discomfort,  such as cramps or bloating. Being unable to have a bowel movement when you feel the urge, or feeling like you still need to pass stool after a bowel movement. Feeling that you have something in your rectum that is blocking or preventing bowel movements. Seeing blood on the toilet paper or in your stool. Worsening confusion (in older adults). How is this diagnosed? This condition may be diagnosed based on: Your symptoms and medical history. You will be asked about your symptoms, lifestyle, diet, and any medicines that you are taking. A physical exam. Your abdomen will be examined. A digital rectal exam may be done. For this exam, a health care provider places a lubricated, gloved finger into the rectum. Tests to check for any underlying causes of your constipation. These may be ordered if you have bleeding in your rectum, weight loss, or a family history of colon cancer. In these cases, you may have: Imaging studies of the colon. These may include X-ray, ultrasound, or a CT scan. Blood tests. A procedure to examine the inside of your colon (colonoscopy). More specialized tests to check: Whether your anal sphincter works well. This is a ring-shaped muscle that controls the closing of the anus. How well food moves through your colon. Tests to measure the nerve signal in your pelvic floor muscles (electromyography). How is this treated? Treatment for chronic constipation depends on the cause. Most often, treatment starts with: Being more active and getting regular exercise. Drinking more fluids. Adding fiber to your diet. Sources of fiber include fruits, vegetables, whole grains, and fiber supplements. Using medicines such as stool softeners or medicines that increase contractions in your digestive system (pro-motility agents). Training your pelvic muscles with biofeedback. Surgery, if there is obstruction. Treatment may also include: Stopping or changing some medicines if  they cause  constipation. Using a fiber supplement (bulk laxative) or stool softener. Using a prescription laxative. This works by [employer] into your colon (osmotic laxative). You may also need to see a specialist who treats conditions of the digestive system (gastroenterologist). Follow these instructions at home: Medicines Take over-the-counter and prescription medicines only as told by your health care provider. If you are taking a laxative, take it as told by your health care provider. Eating and drinking  Eat a balanced diet that includes enough fiber. Ask your health care provider to recommend a diet that is right for you. Drink clear fluids, especially water. Avoid drinking alcohol, caffeine, and soda. These can make constipation worse. Drink enough fluid to keep your urine pale yellow. General instructions Get some physical activity every day. Ask your health care provider what activities are safe for you. Get colon cancer screenings as told by your health care provider. Keep all follow-up visits as told by your health care provider. This is important. Contact a health care provider if you have: Three or fewer bowel movements a week. Stools that are hard or lumpy. Blood on the toilet paper or in your stool after you have a bowel movement. Unexplained weight loss. Rectum (rectal) pain. Stool leakage. Nausea or vomiting. Get help right away if you have: Rectal bleeding or you pass blood clots. Severe rectal pain. Body tissue that pushes out (protrudes) from your anus. Severe pain or bloating (distension) in your abdomen. Vomiting that you cannot control. Summary Chronic constipation is a condition in which a person has three or fewer bowel movements a week, for 3 months or longer. You may have a higher risk for this condition if you are an older adult, you are female, or you have a long-term disease. Treatment for this condition depends on the cause. Most treatments for chronic  constipation include adding fiber to your diet, drinking more fluids, and getting more physical activity. You may also need to treat any underlying medical conditions or stop or change certain medicines if they cause constipation. If lifestyle changes do not relieve constipation, your health care provider may recommend taking a laxative. This information is not intended to replace advice given to you by your health care provider. Make sure you discuss any questions you have with your health care provider. Document Revised: 06/03/2019 Document Reviewed: 06/03/2019 Elsevier Patient Education  [place].

## 2022-08-17 ENCOUNTER — Encounter: Payer: Self-pay | Admitting: Nurse Practitioner

## 2022-08-17 ENCOUNTER — Other Ambulatory Visit: Payer: Self-pay

## 2022-08-17 ENCOUNTER — Other Ambulatory Visit (HOSPITAL_COMMUNITY): Payer: Self-pay | Admitting: *Deleted

## 2022-08-17 ENCOUNTER — Other Ambulatory Visit: Payer: Self-pay | Admitting: Nurse Practitioner

## 2022-08-17 ENCOUNTER — Other Ambulatory Visit (HOSPITAL_COMMUNITY): Payer: Self-pay

## 2022-08-17 DIAGNOSIS — G894 Chronic pain syndrome: Secondary | ICD-10-CM

## 2022-08-17 LAB — CBC WITH DIFFERENTIAL/PLATELET
Basophils Absolute: 0 10*3/uL (ref 0.0–0.2)
Basos: 1 %
EOS (ABSOLUTE): 0.2 10*3/uL (ref 0.0–0.4)
Eos: 4 %
Hematocrit: 34.5 % (ref 34.0–46.6)
Hemoglobin: 11.3 g/dL (ref 11.1–15.9)
Immature Grans (Abs): 0 10*3/uL (ref 0.0–0.1)
Immature Granulocytes: 0 %
Lymphocytes Absolute: 2.2 10*3/uL (ref 0.7–3.1)
Lymphs: 51 %
MCH: 29.1 pg (ref 26.6–33.0)
MCHC: 32.8 g/dL (ref 31.5–35.7)
MCV: 89 fL (ref 79–97)
Monocytes Absolute: 0.4 10*3/uL (ref 0.1–0.9)
Monocytes: 9 %
Neutrophils Absolute: 1.5 10*3/uL (ref 1.4–7.0)
Neutrophils: 35 %
Platelets: 184 10*3/uL (ref 150–450)
RBC: 3.88 x10E6/uL (ref 3.77–5.28)
RDW: 13.2 % (ref 11.7–15.4)
WBC: 4.3 10*3/uL (ref 3.4–10.8)

## 2022-08-17 LAB — COMPREHENSIVE METABOLIC PANEL
ALT: 10 IU/L (ref 0–32)
AST: 16 IU/L (ref 0–40)
Albumin/Globulin Ratio: 1.8 (ref 1.2–2.2)
Albumin: 4.3 g/dL (ref 3.9–4.9)
Alkaline Phosphatase: 41 IU/L — ABNORMAL LOW (ref 44–121)
BUN/Creatinine Ratio: 16 (ref 9–23)
BUN: 15 mg/dL (ref 6–24)
Bilirubin Total: 0.3 mg/dL (ref 0.0–1.2)
CO2: 23 mmol/L (ref 20–29)
Calcium: 9.3 mg/dL (ref 8.7–10.2)
Chloride: 103 mmol/L (ref 96–106)
Creatinine, Ser: 0.94 mg/dL (ref 0.57–1.00)
Globulin, Total: 2.4 g/dL (ref 1.5–4.5)
Glucose: 74 mg/dL (ref 70–99)
Potassium: 4.4 mmol/L (ref 3.5–5.2)
Sodium: 138 mmol/L (ref 134–144)
Total Protein: 6.7 g/dL (ref 6.0–8.5)
eGFR: 74 mL/min/{1.73_m2} (ref >59)

## 2022-08-17 LAB — LIPID PANEL
Chol/HDL Ratio: 2.9 ratio (ref 0.0–4.4)
Cholesterol, Total: 137 mg/dL (ref 100–199)
HDL: 47 mg/dL (ref >39)
LDL Chol Calc (NIH): 78 mg/dL (ref 0–99)
Triglycerides: 55 mg/dL (ref 0–149)
VLDL Cholesterol Cal: 12 mg/dL (ref 5–40)

## 2022-08-17 LAB — VITAMIN D 25 HYDROXY (VIT D DEFICIENCY, FRACTURES): Vit D, 25-Hydroxy: 58.4 ng/mL (ref 30.0–100.0)

## 2022-08-17 LAB — CARDIOVASCULAR RISK ASSESSMENT

## 2022-08-17 MED ORDER — FLUOXETINE HCL 20 MG PO CAPS
ORAL_CAPSULE | ORAL | 0 refills | Status: DC
  Filled 2022-08-17: qty 30, 30d supply, fill #0

## 2022-08-17 MED ORDER — OLANZAPINE 2.5 MG PO TABS
ORAL_TABLET | ORAL | 0 refills | Status: DC
  Filled 2022-08-17: qty 30, 30d supply, fill #0

## 2022-08-17 MED ORDER — MELOXICAM 7.5 MG PO TABS
7.5000 mg | ORAL_TABLET | Freq: Every day | ORAL | 1 refills | Status: DC
  Filled 2022-08-17: qty 90, 90d supply, fill #0

## 2022-08-17 NOTE — Telephone Encounter (Signed)
Done

## 2022-08-18 ENCOUNTER — Other Ambulatory Visit (HOSPITAL_COMMUNITY): Payer: Self-pay

## 2022-08-20 ENCOUNTER — Encounter: Payer: Self-pay | Admitting: Nurse Practitioner

## 2022-08-20 ENCOUNTER — Other Ambulatory Visit (HOSPITAL_COMMUNITY): Payer: Self-pay

## 2022-08-20 ENCOUNTER — Other Ambulatory Visit: Payer: Self-pay | Admitting: Nurse Practitioner

## 2022-08-20 DIAGNOSIS — K5909 Other constipation: Secondary | ICD-10-CM

## 2022-08-20 LAB — T4, FREE: Free T4: 1.23 ng/dL (ref 0.82–1.77)

## 2022-08-20 LAB — SPECIMEN STATUS REPORT

## 2022-08-20 LAB — TSH: TSH: 0.45 u[IU]/mL (ref 0.450–4.500)

## 2022-08-20 MED ORDER — LINACLOTIDE 145 MCG PO CAPS
145.0000 ug | ORAL_CAPSULE | Freq: Every day | ORAL | 2 refills | Status: DC
  Filled 2022-08-20: qty 30, 30d supply, fill #0

## 2022-08-21 ENCOUNTER — Other Ambulatory Visit (HOSPITAL_COMMUNITY): Payer: Self-pay

## 2022-08-21 ENCOUNTER — Other Ambulatory Visit (HOSPITAL_BASED_OUTPATIENT_CLINIC_OR_DEPARTMENT_OTHER): Payer: Self-pay

## 2022-08-22 ENCOUNTER — Other Ambulatory Visit (HOSPITAL_COMMUNITY): Payer: Self-pay

## 2022-08-28 ENCOUNTER — Other Ambulatory Visit: Payer: Self-pay

## 2022-08-29 ENCOUNTER — Other Ambulatory Visit: Payer: Self-pay

## 2022-08-30 ENCOUNTER — Encounter: Payer: Self-pay | Admitting: Physician Assistant

## 2022-08-30 ENCOUNTER — Other Ambulatory Visit: Payer: Self-pay

## 2022-09-07 ENCOUNTER — Encounter: Payer: Self-pay | Admitting: Physician Assistant

## 2022-09-10 ENCOUNTER — Other Ambulatory Visit (HOSPITAL_COMMUNITY): Payer: Self-pay

## 2022-09-10 DIAGNOSIS — M25561 Pain in right knee: Secondary | ICD-10-CM | POA: Diagnosis not present

## 2022-09-10 MED ORDER — HYDROCODONE-ACETAMINOPHEN 5-325 MG PO TABS
ORAL_TABLET | ORAL | 0 refills | Status: DC
  Filled 2022-09-10: qty 30, 5d supply, fill #0

## 2022-09-11 ENCOUNTER — Other Ambulatory Visit (HOSPITAL_BASED_OUTPATIENT_CLINIC_OR_DEPARTMENT_OTHER): Payer: Self-pay | Admitting: Sports Medicine

## 2022-09-11 DIAGNOSIS — M25561 Pain in right knee: Secondary | ICD-10-CM

## 2022-09-15 ENCOUNTER — Ambulatory Visit (HOSPITAL_BASED_OUTPATIENT_CLINIC_OR_DEPARTMENT_OTHER)
Admission: RE | Admit: 2022-09-15 | Discharge: 2022-09-15 | Disposition: A | Discharge status: Home / Self Care | Payer: 59 | Source: Ambulatory Visit | Attending: Sports Medicine | Admitting: Sports Medicine

## 2022-09-15 DIAGNOSIS — M25561 Pain in right knee: Secondary | ICD-10-CM | POA: Diagnosis not present

## 2022-09-17 ENCOUNTER — Other Ambulatory Visit (HOSPITAL_COMMUNITY): Payer: Self-pay

## 2022-09-17 MED ORDER — HYDROCODONE-ACETAMINOPHEN 5-325 MG PO TABS
1.0000 | ORAL_TABLET | ORAL | 0 refills | Status: DC | PRN
  Filled 2022-09-17: qty 30, 5d supply, fill #0

## 2022-09-18 ENCOUNTER — Encounter (HOSPITAL_COMMUNITY): Payer: Self-pay | Admitting: Psychiatry

## 2022-09-18 ENCOUNTER — Telehealth (HOSPITAL_BASED_OUTPATIENT_CLINIC_OR_DEPARTMENT_OTHER): Payer: 59 | Admitting: Psychiatry

## 2022-09-18 ENCOUNTER — Other Ambulatory Visit (HOSPITAL_COMMUNITY): Payer: Self-pay

## 2022-09-18 VITALS — Wt 183.0 lb

## 2022-09-18 DIAGNOSIS — F331 Major depressive disorder, recurrent, moderate: Secondary | ICD-10-CM

## 2022-09-18 DIAGNOSIS — F419 Anxiety disorder, unspecified: Secondary | ICD-10-CM

## 2022-09-18 DIAGNOSIS — F4310 Post-traumatic stress disorder, unspecified: Secondary | ICD-10-CM

## 2022-09-18 MED ORDER — OLANZAPINE 2.5 MG PO TABS
2.5000 mg | ORAL_TABLET | Freq: Every evening | ORAL | 1 refills | Status: DC
  Filled 2022-09-18: qty 30, 30d supply, fill #0

## 2022-09-18 MED ORDER — FLUOXETINE HCL 20 MG PO CAPS
20.0000 mg | ORAL_CAPSULE | Freq: Every evening | ORAL | 1 refills | Status: DC
  Filled 2022-09-18: qty 30, 30d supply, fill #0

## 2022-09-18 MED ORDER — VORTIOXETINE HBR 20 MG PO TABS
20.0000 mg | ORAL_TABLET | Freq: Every day | ORAL | 1 refills | Status: DC
  Filled 2022-09-18: qty 30, 30d supply, fill #0

## 2022-09-18 MED ORDER — ALPRAZOLAM 0.5 MG PO TABS
0.5000 mg | ORAL_TABLET | Freq: Every day | ORAL | 1 refills | Status: DC
  Filled 2022-09-18 – 2022-09-26 (×2): qty 40, 40d supply, fill #0
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
## 2022-09-19 ENCOUNTER — Other Ambulatory Visit (HOSPITAL_COMMUNITY): Payer: Self-pay

## 2022-09-20 ENCOUNTER — Other Ambulatory Visit (HOSPITAL_COMMUNITY): Payer: Self-pay

## 2022-09-25 ENCOUNTER — Telehealth (HOSPITAL_COMMUNITY): Payer: Self-pay | Admitting: Psychiatry

## 2022-09-26 ENCOUNTER — Other Ambulatory Visit (HOSPITAL_COMMUNITY): Payer: Self-pay

## 2022-09-26 ENCOUNTER — Other Ambulatory Visit: Payer: Self-pay

## 2022-09-27 ENCOUNTER — Other Ambulatory Visit (HOSPITAL_COMMUNITY): Payer: Self-pay

## 2022-09-27 DIAGNOSIS — M25561 Pain in right knee: Secondary | ICD-10-CM | POA: Diagnosis not present

## 2022-09-27 MED ORDER — HYDROCODONE-ACETAMINOPHEN 5-325 MG PO TABS
1.0000 | ORAL_TABLET | ORAL | 0 refills | Status: DC | PRN
  Filled 2022-09-27: qty 30, 5d supply, fill #0

## 2022-10-09 ENCOUNTER — Other Ambulatory Visit (HOSPITAL_COMMUNITY): Payer: Self-pay

## 2022-10-11 ENCOUNTER — Other Ambulatory Visit (HOSPITAL_COMMUNITY): Payer: Self-pay

## 2022-10-11 DIAGNOSIS — M25461 Effusion, right knee: Secondary | ICD-10-CM | POA: Diagnosis not present

## 2022-10-11 MED ORDER — PREDNISONE 10 MG PO TABS
ORAL_TABLET | ORAL | 0 refills | Status: DC
  Filled 2022-10-11: qty 20, 8d supply, fill #0

## 2022-10-16 ENCOUNTER — Telehealth (HOSPITAL_BASED_OUTPATIENT_CLINIC_OR_DEPARTMENT_OTHER): Payer: 59 | Admitting: Psychiatry

## 2022-10-16 ENCOUNTER — Encounter (HOSPITAL_COMMUNITY): Payer: Self-pay | Admitting: Psychiatry

## 2022-10-16 ENCOUNTER — Other Ambulatory Visit (HOSPITAL_COMMUNITY): Payer: Self-pay

## 2022-10-16 DIAGNOSIS — F331 Major depressive disorder, recurrent, moderate: Secondary | ICD-10-CM

## 2022-10-16 DIAGNOSIS — F4310 Post-traumatic stress disorder, unspecified: Secondary | ICD-10-CM

## 2022-10-16 DIAGNOSIS — F419 Anxiety disorder, unspecified: Secondary | ICD-10-CM

## 2022-10-16 MED ORDER — VORTIOXETINE HBR 20 MG PO TABS
20.0000 mg | ORAL_TABLET | Freq: Every day | ORAL | 2 refills | Status: DC
  Filled 2022-10-16 – 2022-10-23 (×2): qty 30, 30d supply, fill #0
  Filled 2022-11-25 – 2022-12-06 (×2): qty 30, 30d supply, fill #1
  Filled 2022-12-26 – 2022-12-31 (×2): qty 30, 30d supply, fill #2

## 2022-10-16 MED ORDER — ALPRAZOLAM 0.5 MG PO TABS
0.5000 mg | ORAL_TABLET | Freq: Every evening | ORAL | 2 refills | Status: DC | PRN
  Filled 2022-10-16 – 2022-11-07 (×2): qty 30, 30d supply, fill #0
  Filled 2022-12-06: qty 30, 30d supply, fill #1
  Filled 2022-12-31: qty 30, 30d supply, fill #2

## 2022-10-16 MED ORDER — FLUOXETINE HCL 20 MG PO CAPS
20.0000 mg | ORAL_CAPSULE | Freq: Every evening | ORAL | 2 refills | Status: DC
  Filled 2022-10-16 – 2022-10-23 (×2): qty 30, 30d supply, fill #0
  Filled 2022-11-25 – 2022-12-06 (×2): qty 30, 30d supply, fill #1
  Filled 2022-12-26 – 2022-12-31 (×2): qty 30, 30d supply, fill #2

## 2022-10-16 MED ORDER — OLANZAPINE 2.5 MG PO TABS
2.5000 mg | ORAL_TABLET | Freq: Every evening | ORAL | 2 refills | Status: DC
  Filled 2022-10-16 – 2022-10-29 (×4): qty 30, 30d supply, fill #0
  Filled 2022-11-25 – 2022-12-06 (×2): qty 30, 30d supply, fill #1
  Filled 2022-12-26 – 2022-12-31 (×2): qty 30, 30d supply, fill #2
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
## 2022-10-17 ENCOUNTER — Other Ambulatory Visit (HOSPITAL_COMMUNITY): Payer: Self-pay

## 2022-10-17 ENCOUNTER — Other Ambulatory Visit: Payer: Self-pay

## 2022-10-17 MED ORDER — HYDROCODONE-ACETAMINOPHEN 10-325 MG PO TABS
1.0000 | ORAL_TABLET | Freq: Three times a day (TID) | ORAL | 0 refills | Status: DC | PRN
  Filled 2022-10-17 – 2022-10-23 (×2): qty 30, 10d supply, fill #0

## 2022-10-22 ENCOUNTER — Encounter: Payer: Self-pay | Admitting: Physician Assistant

## 2022-10-23 ENCOUNTER — Other Ambulatory Visit (HOSPITAL_COMMUNITY): Payer: Self-pay

## 2022-10-23 ENCOUNTER — Other Ambulatory Visit: Payer: Self-pay

## 2022-10-23 ENCOUNTER — Other Ambulatory Visit: Payer: Self-pay | Admitting: Physician Assistant

## 2022-10-23 DIAGNOSIS — B009 Herpesviral infection, unspecified: Secondary | ICD-10-CM

## 2022-10-23 MED ORDER — VALACYCLOVIR HCL 500 MG PO TABS
500.0000 mg | ORAL_TABLET | Freq: Two times a day (BID) | ORAL | 1 refills | Status: DC
  Filled 2022-10-23 (×2): qty 10, 5d supply, fill #0
  Filled 2022-11-24 – 2022-12-06 (×2): qty 10, 5d supply, fill #1

## 2022-10-24 ENCOUNTER — Other Ambulatory Visit (HOSPITAL_COMMUNITY): Payer: Self-pay

## 2022-10-24 ENCOUNTER — Other Ambulatory Visit: Payer: Self-pay

## 2022-10-26 ENCOUNTER — Other Ambulatory Visit (HOSPITAL_COMMUNITY): Payer: Self-pay

## 2022-10-29 DIAGNOSIS — M25661 Stiffness of right knee, not elsewhere classified: Secondary | ICD-10-CM | POA: Diagnosis not present

## 2022-10-29 DIAGNOSIS — M25561 Pain in right knee: Secondary | ICD-10-CM | POA: Diagnosis not present

## 2022-10-29 DIAGNOSIS — R2689 Other abnormalities of gait and mobility: Secondary | ICD-10-CM | POA: Diagnosis not present

## 2022-10-30 ENCOUNTER — Other Ambulatory Visit: Payer: Self-pay

## 2022-10-31 DIAGNOSIS — M25562 Pain in left knee: Secondary | ICD-10-CM | POA: Diagnosis not present

## 2022-10-31 DIAGNOSIS — M25561 Pain in right knee: Secondary | ICD-10-CM | POA: Diagnosis not present

## 2022-11-01 ENCOUNTER — Other Ambulatory Visit (HOSPITAL_COMMUNITY): Payer: Self-pay

## 2022-11-02 ENCOUNTER — Ambulatory Visit: Payer: Commercial Managed Care - PPO | Admitting: Internal Medicine

## 2022-11-05 DIAGNOSIS — M25661 Stiffness of right knee, not elsewhere classified: Secondary | ICD-10-CM | POA: Diagnosis not present

## 2022-11-05 DIAGNOSIS — M25561 Pain in right knee: Secondary | ICD-10-CM | POA: Diagnosis not present

## 2022-11-05 DIAGNOSIS — R2689 Other abnormalities of gait and mobility: Secondary | ICD-10-CM | POA: Diagnosis not present

## 2022-11-07 ENCOUNTER — Other Ambulatory Visit: Payer: Self-pay

## 2022-11-07 ENCOUNTER — Other Ambulatory Visit (HOSPITAL_COMMUNITY): Payer: Self-pay

## 2022-11-07 MED ORDER — TRAMADOL HCL ER 200 MG PO TB24
200.0000 mg | ORAL_TABLET | Freq: Every day | ORAL | 0 refills | Status: DC
  Filled 2022-11-07: qty 30, 30d supply, fill #0

## 2022-11-08 ENCOUNTER — Other Ambulatory Visit (HOSPITAL_COMMUNITY): Payer: Self-pay

## 2022-11-08 DIAGNOSIS — M25561 Pain in right knee: Secondary | ICD-10-CM | POA: Diagnosis not present

## 2022-11-08 DIAGNOSIS — M25661 Stiffness of right knee, not elsewhere classified: Secondary | ICD-10-CM | POA: Diagnosis not present

## 2022-11-08 DIAGNOSIS — R2689 Other abnormalities of gait and mobility: Secondary | ICD-10-CM | POA: Diagnosis not present

## 2022-11-14 ENCOUNTER — Other Ambulatory Visit (HOSPITAL_COMMUNITY): Payer: Self-pay

## 2022-11-14 DIAGNOSIS — M25661 Stiffness of right knee, not elsewhere classified: Secondary | ICD-10-CM | POA: Diagnosis not present

## 2022-11-14 DIAGNOSIS — M25562 Pain in left knee: Secondary | ICD-10-CM | POA: Diagnosis not present

## 2022-11-14 DIAGNOSIS — M25561 Pain in right knee: Secondary | ICD-10-CM | POA: Diagnosis not present

## 2022-11-14 DIAGNOSIS — R2689 Other abnormalities of gait and mobility: Secondary | ICD-10-CM | POA: Diagnosis not present

## 2022-11-14 MED ORDER — HYDROCODONE-ACETAMINOPHEN 5-325 MG PO TABS
1.0000 | ORAL_TABLET | ORAL | 0 refills | Status: DC | PRN
  Filled 2022-11-14: qty 30, 5d supply, fill #0

## 2022-11-18 ENCOUNTER — Encounter: Payer: Self-pay | Admitting: Cardiology

## 2022-11-19 ENCOUNTER — Encounter: Payer: Self-pay | Admitting: Physician Assistant

## 2022-11-19 DIAGNOSIS — R2689 Other abnormalities of gait and mobility: Secondary | ICD-10-CM | POA: Diagnosis not present

## 2022-11-19 DIAGNOSIS — M25561 Pain in right knee: Secondary | ICD-10-CM | POA: Diagnosis not present

## 2022-11-19 DIAGNOSIS — M25661 Stiffness of right knee, not elsewhere classified: Secondary | ICD-10-CM | POA: Diagnosis not present

## 2022-11-19 NOTE — Telephone Encounter (Signed)
The patient called the office to see about an appointment for weight management. Patient stated that she has gained 20 lbs in two months. She is having increased appetite and excessive thirst. I spoke with Kennon Rounds and notified her that her new office appointment is not until June 12. Per sally, put her in on June 12 and tell her will call if have cancellation - if she feels she needs to be sooner for any other symptoms recommend urgent care. Patient notified of what sally had stated. The patient did not want to schedule an appointment for June or be added to a wait list. She felt that UC would not help her. The patient is going to wait until her next appointment.

## 2022-11-21 ENCOUNTER — Other Ambulatory Visit (HOSPITAL_COMMUNITY): Payer: Self-pay

## 2022-11-21 MED ORDER — PREDNISONE 10 MG PO TABS
ORAL_TABLET | ORAL | 0 refills | Status: AC
  Filled 2022-11-21: qty 20, 8d supply, fill #0

## 2022-11-24 ENCOUNTER — Other Ambulatory Visit: Payer: Self-pay

## 2022-11-26 ENCOUNTER — Other Ambulatory Visit (HOSPITAL_COMMUNITY): Payer: Self-pay

## 2022-11-29 ENCOUNTER — Other Ambulatory Visit (HOSPITAL_COMMUNITY): Payer: Self-pay

## 2022-11-30 ENCOUNTER — Other Ambulatory Visit (HOSPITAL_COMMUNITY): Payer: Self-pay

## 2022-11-30 ENCOUNTER — Other Ambulatory Visit: Payer: Self-pay

## 2022-11-30 ENCOUNTER — Encounter: Payer: Self-pay | Admitting: Physician Assistant

## 2022-12-03 ENCOUNTER — Other Ambulatory Visit: Payer: Self-pay

## 2022-12-03 ENCOUNTER — Other Ambulatory Visit (HOSPITAL_COMMUNITY): Payer: Self-pay

## 2022-12-03 DIAGNOSIS — F1120 Opioid dependence, uncomplicated: Secondary | ICD-10-CM | POA: Diagnosis not present

## 2022-12-03 MED ORDER — ROSUVASTATIN CALCIUM 5 MG PO TABS
5.0000 mg | ORAL_TABLET | Freq: Every day | ORAL | 0 refills | Status: DC
  Filled 2022-12-03: qty 90, 90d supply, fill #0

## 2022-12-04 DIAGNOSIS — F1120 Opioid dependence, uncomplicated: Secondary | ICD-10-CM | POA: Diagnosis not present

## 2022-12-06 ENCOUNTER — Other Ambulatory Visit (HOSPITAL_COMMUNITY): Payer: Self-pay

## 2022-12-07 ENCOUNTER — Other Ambulatory Visit (HOSPITAL_COMMUNITY): Payer: Self-pay

## 2022-12-07 ENCOUNTER — Other Ambulatory Visit: Payer: Self-pay

## 2022-12-07 ENCOUNTER — Encounter: Payer: Self-pay | Admitting: Physician Assistant

## 2022-12-07 ENCOUNTER — Other Ambulatory Visit: Payer: Self-pay | Admitting: Physician Assistant

## 2022-12-07 MED ORDER — NYSTATIN 100000 UNIT/ML MT SUSP
OROMUCOSAL | 0 refills | Status: DC
  Filled 2022-12-07: qty 327, 16d supply, fill #0

## 2022-12-07 MED ORDER — FLUCONAZOLE 100 MG PO TABS
ORAL_TABLET | ORAL | 0 refills | Status: DC
  Filled 2022-12-07: qty 3, 3d supply, fill #0

## 2022-12-11 ENCOUNTER — Other Ambulatory Visit (HOSPITAL_COMMUNITY): Payer: Self-pay

## 2022-12-11 DIAGNOSIS — F1120 Opioid dependence, uncomplicated: Secondary | ICD-10-CM | POA: Diagnosis not present

## 2022-12-12 ENCOUNTER — Other Ambulatory Visit (HOSPITAL_COMMUNITY): Payer: Self-pay

## 2022-12-12 MED ORDER — BUPRENORPHINE HCL-NALOXONE HCL 8-2 MG SL FILM
ORAL_FILM | SUBLINGUAL | 0 refills | Status: DC
  Filled 2022-12-12: qty 7, 7d supply, fill #0

## 2022-12-13 ENCOUNTER — Other Ambulatory Visit (HOSPITAL_COMMUNITY): Payer: Self-pay

## 2022-12-18 ENCOUNTER — Other Ambulatory Visit: Payer: Self-pay

## 2022-12-18 ENCOUNTER — Other Ambulatory Visit (HOSPITAL_COMMUNITY): Payer: Self-pay

## 2022-12-18 DIAGNOSIS — F1120 Opioid dependence, uncomplicated: Secondary | ICD-10-CM | POA: Diagnosis not present

## 2022-12-18 DIAGNOSIS — G8929 Other chronic pain: Secondary | ICD-10-CM | POA: Diagnosis not present

## 2022-12-18 MED ORDER — BUPRENORPHINE HCL-NALOXONE HCL 8-2 MG SL FILM
ORAL_FILM | SUBLINGUAL | 0 refills | Status: DC
  Filled 2022-12-18: qty 11, 7d supply, fill #0

## 2022-12-23 DIAGNOSIS — M25461 Effusion, right knee: Secondary | ICD-10-CM | POA: Diagnosis not present

## 2022-12-23 DIAGNOSIS — M7989 Other specified soft tissue disorders: Secondary | ICD-10-CM | POA: Diagnosis not present

## 2022-12-24 ENCOUNTER — Emergency Department (HOSPITAL_BASED_OUTPATIENT_CLINIC_OR_DEPARTMENT_OTHER)
Admission: EM | Admit: 2022-12-24 | Discharge: 2022-12-24 | Disposition: A | Discharge status: Home / Self Care | Payer: 59 | Attending: Emergency Medicine | Admitting: Emergency Medicine

## 2022-12-24 ENCOUNTER — Other Ambulatory Visit: Payer: Self-pay

## 2022-12-24 ENCOUNTER — Emergency Department (HOSPITAL_BASED_OUTPATIENT_CLINIC_OR_DEPARTMENT_OTHER): Payer: 59 | Admitting: Radiology

## 2022-12-24 ENCOUNTER — Emergency Department (HOSPITAL_BASED_OUTPATIENT_CLINIC_OR_DEPARTMENT_OTHER): Payer: 59

## 2022-12-24 ENCOUNTER — Encounter (HOSPITAL_BASED_OUTPATIENT_CLINIC_OR_DEPARTMENT_OTHER): Payer: Self-pay | Admitting: Emergency Medicine

## 2022-12-24 ENCOUNTER — Encounter (HOSPITAL_BASED_OUTPATIENT_CLINIC_OR_DEPARTMENT_OTHER): Payer: Self-pay

## 2022-12-24 DIAGNOSIS — R600 Localized edema: Secondary | ICD-10-CM | POA: Diagnosis not present

## 2022-12-24 DIAGNOSIS — Z7982 Long term (current) use of aspirin: Secondary | ICD-10-CM | POA: Diagnosis not present

## 2022-12-24 DIAGNOSIS — M79604 Pain in right leg: Secondary | ICD-10-CM | POA: Diagnosis present

## 2022-12-24 DIAGNOSIS — M25561 Pain in right knee: Secondary | ICD-10-CM | POA: Diagnosis not present

## 2022-12-24 DIAGNOSIS — M25461 Effusion, right knee: Secondary | ICD-10-CM | POA: Insufficient documentation

## 2022-12-24 DIAGNOSIS — M7989 Other specified soft tissue disorders: Secondary | ICD-10-CM | POA: Diagnosis not present

## 2022-12-24 LAB — CBC WITH DIFFERENTIAL/PLATELET
Abs Immature Granulocytes: 0 10*3/uL (ref 0.00–0.07)
Basophils Absolute: 0 10*3/uL (ref 0.0–0.1)
Basophils Relative: 1 %
Eosinophils Absolute: 0 10*3/uL (ref 0.0–0.5)
Eosinophils Relative: 1 %
HCT: 38.5 % (ref 36.0–46.0)
Hemoglobin: 12.4 g/dL (ref 12.0–15.0)
Immature Granulocytes: 0 %
Lymphocytes Relative: 45 %
Lymphs Abs: 2 10*3/uL (ref 0.7–4.0)
MCH: 29.8 pg (ref 26.0–34.0)
MCHC: 32.2 g/dL (ref 30.0–36.0)
MCV: 92.5 fL (ref 80.0–100.0)
Monocytes Absolute: 0.4 10*3/uL (ref 0.1–1.0)
Monocytes Relative: 10 %
Neutro Abs: 1.9 10*3/uL (ref 1.7–7.7)
Neutrophils Relative %: 43 %
Platelets: 243 10*3/uL (ref 150–400)
RBC: 4.16 MIL/uL (ref 3.87–5.11)
RDW: 13.5 % (ref 11.5–15.5)
WBC: 4.3 10*3/uL (ref 4.0–10.5)
nRBC: 0 % (ref 0.0–0.2)

## 2022-12-24 LAB — BASIC METABOLIC PANEL
Anion gap: 4 — ABNORMAL LOW (ref 5–15)
BUN: 12 mg/dL (ref 6–20)
CO2: 31 mmol/L (ref 22–32)
Calcium: 8.8 mg/dL — ABNORMAL LOW (ref 8.9–10.3)
Chloride: 101 mmol/L (ref 98–111)
Creatinine, Ser: 0.94 mg/dL (ref 0.44–1.00)
GFR, Estimated: >60 mL/min (ref >60)
Glucose, Bld: 82 mg/dL (ref 70–99)
Potassium: 4.3 mmol/L (ref 3.5–5.1)
Sodium: 136 mmol/L (ref 135–145)

## 2022-12-24 LAB — SYNOVIAL CELL COUNT + DIFF, W/ CRYSTALS
Crystals, Fluid: NONE SEEN
Eosinophils-Synovial: 0 % (ref 0–1)
Lymphocytes-Synovial Fld: 47 % — ABNORMAL HIGH (ref 0–20)
Monocyte-Macrophage-Synovial Fluid: 51 % (ref 50–90)
Neutrophil, Synovial: 2 % (ref 0–25)
WBC, Synovial: 9 /mm3 (ref 0–200)

## 2022-12-24 LAB — BODY FLUID CULTURE W GRAM STAIN

## 2022-12-24 LAB — SEDIMENTATION RATE: Sed Rate: 8 mm/hr (ref 0–22)

## 2022-12-24 MED ORDER — LIDOCAINE-EPINEPHRINE (PF) 2 %-1:200000 IJ SOLN
INTRAMUSCULAR | Status: AC
  Administered 2022-12-24: 20 mL
  Filled 2022-12-24: qty 20

## 2022-12-24 MED ORDER — LIDOCAINE-EPINEPHRINE 2 %-1:100000 IJ SOLN: 20.0000 mL | Freq: Once | INTRAMUSCULAR | Status: DC

## 2022-12-24 MED ORDER — OXYCODONE HCL 5 MG PO TABS
5.0000 mg | ORAL_TABLET | Freq: Once | ORAL | Status: AC
  Administered 2022-12-24: 5 mg via ORAL
  Filled 2022-12-24: qty 1

## 2022-12-24 NOTE — ED Triage Notes (Signed)
She was also told that her d-dimer was high.

## 2022-12-24 NOTE — ED Provider Notes (Signed)
I provided a substantive portion of the care of this patient.  I personally made/approved the management plan for this patient and take responsibility for the patient management.     Patient had right leg pain and swelling particularly around the knee.  These have increased over the past few days now with some purplish discoloration as well.  She has had steroid injections in the knee twice last episode over a month ago.  She reports it was helpful.  Moderate joint effusion at the right knee.  Some ecchymotic discoloration around the posterior lateral medial aspect of the knee.  Tender to any range of motion.  Foot is warm and dry with 2+ pulse.  Agree with x-rays and ultrasound.  I agree with plan of management.   Arby Barrette, MD 12/24/22 [phone]

## 2022-12-24 NOTE — ED Triage Notes (Signed)
She did have CT of knee at [facility] last night . She did not receive info regarding that from MD. Nurse told it was some fluid on her knee

## 2022-12-24 NOTE — Discharge Instructions (Signed)
Your blood cell counts and inflammatory cell count was normal.  The ultrasound of your leg did not show any signs of blood clot and your x-ray did not show any signs of fractures or dislocations.  It did show some swelling in the knee.  As we discussed, at this point we do not have all of the results back in regards to your synovial fluid which was drawn out of your knee.  We cannot ensure that you do not have an infection or another type of inflammatory arthritis at this time.  I will contact as soon as I know, if there are any concerning findings.  In the interim, please follow-up with Dr. Sherlean Foot as planned tomorrow.  He may be able to check on the results as well through EPIC.

## 2022-12-24 NOTE — ED Triage Notes (Signed)
Pt noticed red/purplish rash to back of right knee and calf, painful. Pain in the whole leg. No fevers

## 2022-12-24 NOTE — ED Notes (Signed)
ED Provider at bedside. 

## 2022-12-24 NOTE — ED Provider Notes (Signed)
[place] EMERGENCY DEPARTMENT AT [facility] Provider Note   CSN: [id number] Arrival date & time: 12/24/22  2956     History  No chief complaint on file.   Desiree Russell is a 52 y.o. female.  Patient with history of stroke on Plavix, also on buprenorphine -- presents to the dizzy department today for evaluation of right leg pain.  Patient states that she injured her leg and knee about 3 months ago while exercising.  However over the past couple of days she has had increasing pain in her knee and as well as her calf and thigh.  She developed a discoloration and some induration behind the right knee itself.  She is having difficulty bearing weight due to the pain.  Her knee is more swollen than it has been.  She states that she went to [facility] last night.  She had some blood work and a CT done of her knee, but states that she was never given results except that she knows that her D-dimer was high.  She denies Doppler study being performed.  No fevers, chest pain or shortness of breath.  No distal numbness or tingling.       Home Medications Prior to Admission medications   Medication Sig Start Date End Date Taking? Authorizing Provider  acetaminophen (TYLENOL) 325 MG tablet Take 650 mg by mouth every 6 (six) hours as needed for mild pain, fever or headache.    [provider]  ALPRAZolam Prudy Feeler) 0.5 MG tablet Take 1 tablet (0.5 mg total) by mouth at bedtime as needed for anxiety. 10/16/22   Arfeen, Phillips Grout, MD  aspirin 81 MG EC tablet Take 1 tablet (81 mg total) by mouth daily. 01/23/21   Edsel Petrin, DO  Buprenorphine HCl-Naloxone HCl 8-2 MG FILM Place 1&1/2 film under tongue once a day 12/18/22     Calcium-Vitamin D-Vitamin K 650-12.5-40 MG-MCG-MCG CHEW Chew 1 tablet by mouth daily.    [provider]  clopidogrel (PLAVIX) 75 MG tablet Take 1 tablet (75 mg total) by mouth once daily 05/11/22   Janie Morning, NP  cyclobenzaprine (FLEXERIL) 10 MG  tablet Take 0.5-1 tablets (5-10 mg total) by mouth at bedtime as needed for muscle spasms. 05/16/22   Janie Morning, NP  fluconazole (DIFLUCAN) 100 MG tablet Take 1 tablet by mouth once daily for 3 days 12/07/22     FLUoxetine (PROZAC) 20 MG capsule Take 1 capsule (20 mg total) by mouth every evening. 10/16/22   Arfeen, Phillips Grout, MD  furosemide (LASIX) 20 MG tablet Take 1 tablet by mouth daily as needed. 03/21/21 03/08/22  Rollene Rotunda, MD  gabapentin (NEURONTIN) 300 MG capsule Take 3 capsules by mouth at bedtime. 12/20/21   Janie Morning, NP  gabapentin (NEURONTIN) 600 MG tablet Take 1 tablet  by mouth at bedtime. 05/03/22   Janie Morning, NP  Galcanezumab-gnlm 120 MG/ML SOAJ Inject 120 mg into the skin every 28 days. 03/14/22     HYDROcodone-acetaminophen (NORCO) 10-325 MG tablet Take 1 tablet by mouth every 8 (eight) hours as needed. 10/17/22     HYDROcodone-acetaminophen (NORCO/VICODIN) 5-325 MG tablet Take 1 tablet every 4-6 hours by oral route as needed for pain for 5 days. 09/10/22     HYDROcodone-acetaminophen (NORCO/VICODIN) 5-325 MG tablet Take 1 tablet by mouth every 4-6 hours as needed for pain 11/14/22     linaclotide (LINZESS) 145 MCG CAPS capsule Take 1 capsule (145 mcg total) by mouth daily before breakfast.  08/20/22   Janie Morning, NP  loratadine (CLARITIN) 10 MG tablet Take 1 tablet (10 mg total) by mouth daily. 12/07/21   Janie Morning, NP  meloxicam (MOBIC) 7.5 MG tablet Take 1 tablet (7.5 mg total) by mouth daily. 08/17/22   Janie Morning, NP  metoprolol succinate (TOPROL-XL) 25 MG 24 hr tablet Take 1 tablet by mouth daily. 03/09/22 03/09/23  Rollene Rotunda, MD  Multiple Vitamin (MULTIVITAMIN WITH MINERALS) TABS tablet Take 1 tablet by mouth daily.    [provider]  nystatin (MYCOSTATIN) 100000 UNIT/ML suspension Take 5 mL by mouth 4 times a day 12/07/22     OLANZapine (ZYPREXA) 2.5 MG tablet Take 1 tablet (2.5 mg total) by mouth every evening. 10/16/22    Arfeen, Phillips Grout, MD  ondansetron (ZOFRAN) 4 MG tablet Take 1 tablet by mouth every 8 hours as needed for nausea or vomiting. 08/15/21   Janie Morning, NP  pantoprazole (PROTONIX) 40 MG tablet Take 1 tablet by mouth daily. 08/02/22   Lynann Bologna, MD  rosuvastatin (CRESTOR) 5 MG tablet Take 1 tablet (5 mg total) by mouth daily. 12/03/22   Marianne Sofia, PA-C  Semaglutide-Weight Management (WEGOVY) 2.4 MG/0.75ML SOAJ Inject 2.4 mg into the skin once a week. 07/03/22   Janie Morning, NP  senna-docusate (SENOKOT-S) 8.6-50 MG tablet Take 2 tablets by mouth daily. 02/07/22   Janie Morning, NP  traMADol (ULTRAM-ER) 200 MG 24 hr tablet Take 1 tablet (200 mg total) by mouth daily as needed 11/07/22     Ubrogepant (UBRELVY) 100 MG TABS TAKE  1/2 TABLET BY MOUTH AT HEADACHE ONSET. MAY REPEAT DOSE IN TWO HOURS IF NO IMPROVEMENT. DO NOT EXCEED MORE THAN TWO TABLETS IN 24 HOURS. 02/23/22     valACYclovir (VALTREX) 500 MG tablet Take 1 tablet (500 mg total) by mouth 2 (two) times daily for 5 days 10/23/22   Marianne Sofia, PA-C  vortioxetine HBr (TRINTELLIX) 20 MG TABS tablet Take 1 tablet (20 mg total) by mouth daily. 10/16/22   Arfeen, Phillips Grout, MD      Allergies    Codeine, Morphine, Morphine and codeine, and Chlorhexidine    Review of Systems   Review of Systems  Physical Exam Updated Vital Signs BP 122/76 (BP Location: Left Arm)   Pulse 87   Temp 98 F (36.7 C) (Oral)   Resp 15   Wt 97.5 kg   SpO2 100%   BMI 36.90 kg/m  Physical Exam Vitals and nursing note reviewed.  Constitutional:      General: She is not in acute distress.    Appearance: She is well-developed.  HENT:     Head: Normocephalic and atraumatic.     Right Ear: External ear normal.     Left Ear: External ear normal.     Nose: Nose normal.  Eyes:     Conjunctiva/sclera: Conjunctivae normal.  Cardiovascular:     Rate and Rhythm: Normal rate and regular rhythm.     Pulses:          Dorsalis pedis pulses are 2+ on the right side  and 2+ on the left side.       Posterior tibial pulses are 2+ on the right side and 2+ on the left side.     Heart sounds: No murmur heard. Pulmonary:     Effort: No respiratory distress.     Breath sounds: No wheezing, rhonchi or rales.  Abdominal:     Palpations: Abdomen is  soft.     Tenderness: There is no abdominal tenderness. There is no guarding or rebound.  Musculoskeletal:     Cervical back: Normal range of motion and neck supple.     Right hip: No tenderness.     Right upper leg: Tenderness present. No swelling.     Right knee: Swelling and effusion present. Decreased range of motion.     Right lower leg: Edema present.     Left lower leg: No edema.     Right ankle: No tenderness. Normal range of motion.     Comments: There is a raised area of mild hyperpigmentation, irregular to the right popliteal space.  Patient is generally tender in the right knee as well as the thigh and lower leg.  Skin:    General: Skin is warm and dry.     Findings: No rash.  Neurological:     General: No focal deficit present.     Mental Status: She is alert. Mental status is at baseline.     Motor: No weakness.  Psychiatric:        Mood and Affect: Mood normal.     ED Results / Procedures / Treatments   Labs (all labs ordered are listed, but only abnormal results are displayed) Labs Reviewed  BASIC METABOLIC PANEL - Abnormal; Notable for the following components:      Result Value   Calcium 8.8 (*)    Anion gap 4 (*)    All other components within normal limits  BODY FLUID CULTURE W GRAM STAIN  CBC WITH DIFFERENTIAL/PLATELET  SEDIMENTATION RATE  GLUCOSE, BODY FLUID OTHER            PROTEIN, BODY FLUID (OTHER)  SYNOVIAL CELL COUNT + DIFF, W/ CRYSTALS    EKG None  Radiology DG Knee Complete 4 Views Right  Result Date: 12/24/2022 CLINICAL DATA:  Right knee pain and swelling. Inability to bear weight. EXAM: RIGHT KNEE - COMPLETE 4+ VIEW COMPARISON:  None Available. FINDINGS: No  evidence of fracture or dislocation. A small to moderate knee joint effusion is seen. Mild degenerative spurring is seen involving the patella and tibial spines. No significant joint space narrowing. No other bone lesions identified. IMPRESSION: Small to moderate knee joint effusion. No evidence of fracture. Mild degenerative spurring of patella and tibial spines. Electronically Signed   By: Danae Orleans M.D.   On: 12/24/2022 11:08   US Venous Img Lower Right (DVT Study)  Result Date: 12/24/2022 CLINICAL DATA:  Right lower extremity pain and edema. EXAM: RIGHT LOWER EXTREMITY VENOUS DOPPLER ULTRASOUND TECHNIQUE: Gray-scale sonography with graded compression, as well as color Doppler and duplex ultrasound were performed to evaluate the lower extremity deep venous systems from the level of the common femoral vein and including the common femoral, femoral, profunda femoral, popliteal and calf veins including the posterior tibial, peroneal and gastrocnemius veins when visible. The superficial great saphenous vein was also interrogated. Spectral Doppler was utilized to evaluate flow at rest and with distal augmentation maneuvers in the common femoral, femoral and popliteal veins. COMPARISON:  None Available. FINDINGS: Contralateral Common Femoral Vein: Respiratory phasicity is normal and symmetric with the symptomatic side. No evidence of thrombus. Normal compressibility. Common Femoral Vein: No evidence of thrombus. Normal compressibility, respiratory phasicity and response to augmentation. Saphenofemoral Junction: No evidence of thrombus. Normal compressibility and flow on color Doppler imaging. Profunda Femoral Vein: No evidence of thrombus. Normal compressibility and flow on color Doppler imaging. Femoral Vein: No evidence of  thrombus. Normal compressibility, respiratory phasicity and response to augmentation. Popliteal Vein: No evidence of thrombus. Normal compressibility, respiratory phasicity and response to  augmentation. Calf Veins: No evidence of thrombus. Normal compressibility and flow on color Doppler imaging. Superficial Great Saphenous Vein: No evidence of thrombus. Normal compressibility. Venous Reflux:  None. Other Findings: No evidence of superficial thrombophlebitis or abnormal fluid collection. IMPRESSION: No evidence of right lower extremity deep venous thrombosis. Electronically Signed   By: Irish Lack M.D.   On: 12/24/2022 10:49    Procedures .Joint Aspiration/Arthrocentesis  Date/Time: 12/24/2022 1:26 PM  Performed by: Renne Crigler, PA-C Authorized by: Renne Crigler, PA-C   Consent:    Consent obtained:  Written   Consent given by:  Patient   Risks discussed:  Bleeding, infection, pain and incomplete drainage   Alternatives discussed:  No treatment Universal protocol:    Patient identity confirmed:  Verbally with patient and provided demographic data Location:    Location:  Knee   Knee:  R knee Anesthesia:    Anesthesia method:  Local infiltration   Local anesthetic:  Lidocaine 2% WITH epi Procedure details:    Preparation: Patient was prepped and draped in usual sterile fashion     Needle gauge:  18 G   Ultrasound guidance: no     Approach:  Anterior (anterolateral)   Aspirate amount:  30cc   Aspirate characteristics:  Yellow   Steroid injected: no     Specimen collected: yes   Post-procedure details:    Dressing:  Adhesive bandage   Procedure completion:  Tolerated well, no immediate complications     Medications Ordered in ED Medications  oxyCODONE (Oxy IR/ROXICODONE) immediate release tablet 5 mg (has no administration in time range)    ED Course/ Medical Decision Making/ A&P    Patient seen and examined. History obtained directly from patient.   Labs/EKG: None ordered  Imaging: X-ray of the right knee and right lower extremity DVT study.  Medications/Fluids: Ordered: Oral oxycodone.   Most recent vital signs reviewed and are as follows: BP  122/76 (BP Location: Left Arm)   Pulse 87   Temp 98 F (36.7 C) (Oral)   Resp 15   Wt 97.5 kg   SpO2 100%   BMI 36.90 kg/m   Initial impression: Right knee pain and swelling, right leg pain, popliteal skin changes.  Patient discussed with and seen by Dr. Donnald Garre. Agrees joint aspiration is warranted. I have ordered CBC, BMP, ESR as well.   1:25 PM Arthrocentesis performed without complications.   6:36 PM Reassessment performed. Patient appears stable.  Unfortunately we have been unable to obtain results of synovial fluid testing at this time.  Patient is requesting discharge to home as she does not want to stay any longer to wait.  I discussed results to this point with patient and family at bedside.  Fortunately, she has orthopedic follow-up tomorrow morning at 10 AM.  Results to this point are reassuring.  Labs personally reviewed and interpreted including: CBC with normal white blood cell count and hemoglobin; BMP appears normal; sed rate is normal.  Gram stain without WBC or organisms.  Synovial cell count with differential and with crystals is pending.  Imaging personally visualized and interpreted including: X-ray of the knee, agree small effusion, no fractures; ultrasound of the lower extremity agree no signs of DVT.  Reviewed pertinent lab work and imaging with patient at bedside. Questions answered.   Most current vital signs reviewed and are as follows: BP  119/84   Pulse 85   Temp 97.8 F (36.6 C) (Oral)   Resp 16   Ht 5\' 4"  (1.626 m)   Wt 97.5 kg   SpO2 97%   BMI 36.90 kg/m   Plan: Will discharge at this time.  I will follow-up on results and call with any emergent findings.  Patient will continue to elevate her leg, take her home medications, although she did tell me that she has been off of Suboxone for about a week.  Strongly encouraged orthopedic follow-up tomorrow as planned.  Encouraged to return to the emergency department with worsening severe pain,  worsening swelling, fever, development of redness or if the discoloration of her skin gets worse.                             Medical Decision Making Amount and/or Complexity of Data Reviewed Labs: ordered. Radiology: ordered.  Risk Prescription drug management.   Patient with right knee pain and swelling.  Unclear etiology.  No injury.  She is on Plavix.  Joint aspirate today was yellow and clear.  Unable to obtain fluid testing at this point.  However, she has been afebrile and does have some range of motion of the joint making septic arthritis less likely.  White blood cell count is normal.  ESR is normal.  She has orthopedic follow-up.          Final Clinical Impression(s) / ED Diagnoses Final diagnoses:  Pain and swelling of right knee    Rx / DC Orders ED Discharge Orders     None         Renne Crigler, PA-C 12/24/22 1840    Arby Barrette, MD 12/25/22 1550

## 2022-12-25 DIAGNOSIS — M25561 Pain in right knee: Secondary | ICD-10-CM | POA: Diagnosis not present

## 2022-12-25 DIAGNOSIS — M1711 Unilateral primary osteoarthritis, right knee: Secondary | ICD-10-CM | POA: Diagnosis not present

## 2022-12-25 LAB — BODY FLUID CULTURE W GRAM STAIN

## 2022-12-26 ENCOUNTER — Other Ambulatory Visit (HOSPITAL_COMMUNITY): Payer: Self-pay

## 2022-12-26 ENCOUNTER — Other Ambulatory Visit: Payer: Self-pay

## 2022-12-26 LAB — BODY FLUID CULTURE W GRAM STAIN: Gram Stain: NONE SEEN

## 2022-12-27 LAB — GLUCOSE, BODY FLUID OTHER: Glucose, Body Fluid Other: 71 mg/dL

## 2022-12-27 LAB — BODY FLUID CULTURE W GRAM STAIN: Culture: NO GROWTH

## 2022-12-27 LAB — PROTEIN, BODY FLUID (OTHER): Total Protein, Body Fluid Other: 2.6 g/dL

## 2022-12-27 NOTE — Progress Notes (Signed)
Cardiology Office Note:   Date:  12/28/2022  ID:  Desiree Russell, DOB Jan 16, 1971, MRN [id number] PCP: Marianne Sofia, Cordelia Poche  [facility] Providers Cardiologist:  Rollene Rotunda, MD {   History of Present Illness:   Desiree Russell is a 52 y.o. female  who was referred by Janie Morning, NP for evaluation of pericardial effusion.  This was noted on an echocardiogram at [facility]  The patient has a complicated history and has been at [facility]  and [facility].   She was at [facility] .   She was hospitalized and treated at [facility] for pericarditis.  This was in 2019.  Most recently she was hospitalized in June 2022 at [facility].   There was a questionable CVA.  MRI showed an acute early or subacute infarct in the right parietal lobe although it was not entirely clear that all her symptoms are related to this.  As part of this work-up she had an echocardiogram.  I was interested to see this as there was no evidence of the previously mentioned pericardial effusion.  There was an otherwise unremarkable echocardiogram.  She was in the emergency room in August 2022 with lightheadedness, difficulty with word finding.  However, there were no acute findings on MRI.     Since I last saw her she has had no new cardiac complaints.  She is interested perhaps dropping some of her medications.  She has injured her knee and she is actually going to get arthroscopic surgery in a couple of days.  She had been doing okay prior to this.  She says her blood pressure has been well-controlled and she shows me a diary.  She has not been having the palpitations that she was having.  She has not been having any new shortness of breath, PND orthopnea.  She has had no chest pressure.  Her weight is up because she has not been able to be as active.  ROS: Right knee pain  Studies Reviewed:    EKG: Sinus rhythm, rate 88, axis within normal limits, intervals within normal limits, no acute ST-T wave  changes.   Risk Assessment/Calculations:              Physical Exam:   VS:  BP 120/80 (BP Location: Left Arm, Patient Position: Sitting, Cuff Size: Large)   Pulse 88   Ht 5\' 4"  (1.626 m)   Wt 226 lb (102.5 kg)   SpO2 96%   BMI 38.79 kg/m    Wt Readings from Last 3 Encounters:  12/28/22 226 lb (102.5 kg)  12/24/22 215 lb (97.5 kg)  08/16/22 193 lb (87.5 kg)     GEN: Well nourished, well developed in no acute distress NECK: No JVD; No carotid bruits CARDIAC: RRR, brief soft systolic murmur at left upper sternal border, no diastolic murmurs, rubs, gallops RESPIRATORY:  Clear to auscultation without rales, wheezing or rhonchi  ABDOMEN: Soft, non-tender, non-distended EXTREMITIES:  No edema; No deformity   ASSESSMENT AND PLAN:   PERICARDIAL EFFUSION:   She had no evidence of this on follow-up echo in 2022 and no physical exam evidence of this.  No further imaging  PALPITATIONS:    She is not having these any longer and she would like to reduce her medication so we will start Toprol-XL 25.    DYSLIPIDEMIA: LDL was mildly elevated but she has been dieting and she did not have any coronary calcium noted on the CT so I will discontinue her  Crestor and attempt to reduce polypharmacy.  HTN: Blood pressure is controlled and I think she can stop the metoprolol as above and just keep an eye on this.  PREOP: The patient is at acceptable risk for arthroscopic knee surgery.  She has no high risk findings and no further cardiovascular testing is suggested according to ACC/AHA guidelines.         Signed, Rollene Rotunda, MD

## 2022-12-28 ENCOUNTER — Ambulatory Visit: Payer: 59 | Attending: Cardiology | Admitting: Cardiology

## 2022-12-28 ENCOUNTER — Other Ambulatory Visit (HOSPITAL_COMMUNITY): Payer: Self-pay

## 2022-12-28 ENCOUNTER — Encounter: Payer: Self-pay | Admitting: Cardiology

## 2022-12-28 VITALS — BP 120/80 | HR 88 | Ht 64.0 in | Wt 226.0 lb

## 2022-12-28 DIAGNOSIS — R002 Palpitations: Secondary | ICD-10-CM

## 2022-12-28 DIAGNOSIS — I3139 Other pericardial effusion (noninflammatory): Secondary | ICD-10-CM | POA: Diagnosis not present

## 2022-12-28 DIAGNOSIS — E785 Hyperlipidemia, unspecified: Secondary | ICD-10-CM

## 2022-12-28 NOTE — Patient Instructions (Signed)
Medication Instructions:  Stop Toprol Stop Crestor Continue all other medications   Lab Work: None ordered   Testing/Procedures: None ordered   Follow-Up: At [facility], you and your health needs are our priority.  As part of our continuing mission to provide you with exceptional heart care, we have created designated Provider Care Teams.  These Care Teams include your primary Cardiologist (physician) and Advanced Practice Providers (APPs -  Physician Assistants and Nurse Practitioners) who all work together to provide you with the care you need, when you need it.  We recommend signing up for the patient portal called "MyChart".  Sign up information is provided on this After Visit Summary.  MyChart is used to connect with patients for Virtual Visits (Telemedicine).  Patients are able to view lab/test results, encounter notes, upcoming appointments, etc.  Non-urgent messages can be sent to your provider as well.   To learn more about what you can do with MyChart, go to [email or web address].    Your next appointment:  As Needed    Provider:  Dr.Hochrein

## 2022-12-31 ENCOUNTER — Other Ambulatory Visit: Payer: Self-pay

## 2022-12-31 ENCOUNTER — Other Ambulatory Visit (HOSPITAL_COMMUNITY): Payer: Self-pay

## 2022-12-31 ENCOUNTER — Encounter: Payer: Self-pay | Admitting: Physician Assistant

## 2022-12-31 DIAGNOSIS — G619 Inflammatory polyneuropathy, unspecified: Secondary | ICD-10-CM

## 2022-12-31 DIAGNOSIS — Z8673 Personal history of transient ischemic attack (TIA), and cerebral infarction without residual deficits: Secondary | ICD-10-CM

## 2022-12-31 MED ORDER — GABAPENTIN 600 MG PO TABS
600.0000 mg | ORAL_TABLET | Freq: Every day | ORAL | 0 refills | Status: DC
Start: 2022-12-31 — End: 2023-04-02
  Filled 2022-12-31: qty 90, 90d supply, fill #0

## 2022-12-31 MED ORDER — CLOPIDOGREL BISULFATE 75 MG PO TABS
75.0000 mg | ORAL_TABLET | Freq: Every day | ORAL | 0 refills | Status: DC
Start: 2022-12-31 — End: 2023-06-03
  Filled 2022-12-31 – 2023-03-02 (×2): qty 90, 90d supply, fill #0

## 2023-01-01 ENCOUNTER — Other Ambulatory Visit (HOSPITAL_COMMUNITY): Payer: Self-pay

## 2023-01-01 ENCOUNTER — Telehealth: Payer: Self-pay

## 2023-01-01 NOTE — Telephone Encounter (Signed)
Cpe appointment has been scheduled.

## 2023-01-01 NOTE — Telephone Encounter (Signed)
Appointment has been rescheduled to August 7 at 2 for her chronic follow-up. The patient is requesting that the appointment be changed to a CPE or combine with her chronic follow-up. Patient notified that it will be up to the provider to determine what we are able to do. The patient stated that she needs a CPE for insurance and did not want to make two appointments.  Kennon Rounds please advise.

## 2023-01-01 NOTE — Telephone Encounter (Signed)
I left a message on the patients phone stating that the appointment is unable to be combined with her cpe. We can get her scheduled for a CPE at a later date. I stated on the voicemail that the patient can call the office back if she has any questions.

## 2023-01-01 NOTE — Telephone Encounter (Signed)
I left a message on the number(s) listed in the patients chart requesting the patient to call back regarding the upcomming appointment for 02/18/2023. The provider is out of the office that day. The appointment has been canceled. Waiting for the patient to return the call.

## 2023-01-02 ENCOUNTER — Other Ambulatory Visit (HOSPITAL_COMMUNITY): Payer: Self-pay

## 2023-01-02 DIAGNOSIS — S83231A Complex tear of medial meniscus, current injury, right knee, initial encounter: Secondary | ICD-10-CM | POA: Diagnosis not present

## 2023-01-02 DIAGNOSIS — M6751 Plica syndrome, right knee: Secondary | ICD-10-CM | POA: Diagnosis not present

## 2023-01-02 DIAGNOSIS — M6752 Plica syndrome, left knee: Secondary | ICD-10-CM | POA: Diagnosis not present

## 2023-01-02 DIAGNOSIS — M2241 Chondromalacia patellae, right knee: Secondary | ICD-10-CM | POA: Diagnosis not present

## 2023-01-02 DIAGNOSIS — M659 Synovitis and tenosynovitis, unspecified: Secondary | ICD-10-CM | POA: Diagnosis not present

## 2023-01-02 DIAGNOSIS — M94262 Chondromalacia, left knee: Secondary | ICD-10-CM | POA: Diagnosis not present

## 2023-01-02 DIAGNOSIS — S83232A Complex tear of medial meniscus, current injury, left knee, initial encounter: Secondary | ICD-10-CM | POA: Diagnosis not present

## 2023-01-02 DIAGNOSIS — G8918 Other acute postprocedural pain: Secondary | ICD-10-CM | POA: Diagnosis not present

## 2023-01-02 HISTORY — PX: KNEE ARTHROSCOPY: SUR90

## 2023-01-02 MED ORDER — IBUPROFEN 600 MG PO TABS
ORAL_TABLET | ORAL | 0 refills | Status: DC
  Filled 2023-01-02: qty 30, 8d supply, fill #0

## 2023-01-07 ENCOUNTER — Other Ambulatory Visit (HOSPITAL_COMMUNITY): Payer: Self-pay

## 2023-01-08 ENCOUNTER — Other Ambulatory Visit (HOSPITAL_COMMUNITY): Payer: Self-pay

## 2023-01-09 ENCOUNTER — Other Ambulatory Visit (HOSPITAL_COMMUNITY): Payer: Self-pay

## 2023-01-15 ENCOUNTER — Telehealth (HOSPITAL_COMMUNITY): Payer: 59 | Admitting: Psychiatry

## 2023-01-15 DIAGNOSIS — R2689 Other abnormalities of gait and mobility: Secondary | ICD-10-CM | POA: Diagnosis not present

## 2023-01-15 DIAGNOSIS — M25561 Pain in right knee: Secondary | ICD-10-CM | POA: Diagnosis not present

## 2023-01-15 DIAGNOSIS — M25661 Stiffness of right knee, not elsewhere classified: Secondary | ICD-10-CM | POA: Diagnosis not present

## 2023-01-17 ENCOUNTER — Other Ambulatory Visit: Payer: Self-pay

## 2023-01-17 ENCOUNTER — Other Ambulatory Visit (HOSPITAL_COMMUNITY): Payer: Self-pay

## 2023-01-18 ENCOUNTER — Other Ambulatory Visit (HOSPITAL_COMMUNITY): Payer: Self-pay

## 2023-01-21 ENCOUNTER — Telehealth (HOSPITAL_COMMUNITY): Payer: 59 | Admitting: Psychiatry

## 2023-01-21 ENCOUNTER — Other Ambulatory Visit: Payer: Self-pay

## 2023-01-21 ENCOUNTER — Other Ambulatory Visit (HOSPITAL_COMMUNITY): Payer: Self-pay

## 2023-01-23 ENCOUNTER — Other Ambulatory Visit: Payer: Self-pay

## 2023-01-23 DIAGNOSIS — F4310 Post-traumatic stress disorder, unspecified: Secondary | ICD-10-CM

## 2023-01-23 DIAGNOSIS — F419 Anxiety disorder, unspecified: Secondary | ICD-10-CM

## 2023-01-23 DIAGNOSIS — F331 Major depressive disorder, recurrent, moderate: Secondary | ICD-10-CM

## 2023-01-23 NOTE — Telephone Encounter (Signed)
Called patient left detailed message to call office with any questions

## 2023-01-23 NOTE — Telephone Encounter (Signed)
Notify pt to get her prozac filled through her provider at behavioral health.  There is a request for prozac however it also shows she is taking trintillex - these meds not usually used together and would prefer the prescribing provider fill med

## 2023-01-26 ENCOUNTER — Other Ambulatory Visit: Payer: Self-pay

## 2023-01-27 ENCOUNTER — Encounter: Payer: Self-pay | Admitting: Cardiology

## 2023-01-28 ENCOUNTER — Other Ambulatory Visit (HOSPITAL_COMMUNITY): Payer: Self-pay

## 2023-01-28 MED ORDER — METOPROLOL SUCCINATE ER 25 MG PO TB24: 25.0000 mg | ORAL_TABLET | Freq: Every day | ORAL | 3 refills | Status: DC

## 2023-01-28 NOTE — Addendum Note (Signed)
Addended by: Candie Chroman on: 01/28/2023 12:10 PM   Modules accepted: Orders

## 2023-02-04 ENCOUNTER — Encounter (HOSPITAL_COMMUNITY): Payer: Self-pay | Admitting: Psychiatry

## 2023-02-04 ENCOUNTER — Other Ambulatory Visit (HOSPITAL_COMMUNITY): Payer: Self-pay

## 2023-02-04 ENCOUNTER — Encounter (HOSPITAL_COMMUNITY): Payer: Self-pay

## 2023-02-04 ENCOUNTER — Telehealth (HOSPITAL_BASED_OUTPATIENT_CLINIC_OR_DEPARTMENT_OTHER): Payer: 59 | Admitting: Psychiatry

## 2023-02-04 VITALS — Wt 215.0 lb

## 2023-02-04 DIAGNOSIS — F331 Major depressive disorder, recurrent, moderate: Secondary | ICD-10-CM

## 2023-02-04 DIAGNOSIS — F419 Anxiety disorder, unspecified: Secondary | ICD-10-CM

## 2023-02-04 DIAGNOSIS — F4310 Post-traumatic stress disorder, unspecified: Secondary | ICD-10-CM

## 2023-02-04 MED ORDER — FLUOXETINE HCL 20 MG PO CAPS
20.0000 mg | ORAL_CAPSULE | Freq: Every evening | ORAL | 2 refills | Status: DC
Start: 2023-02-04 — End: 2023-05-29
  Filled 2023-02-04: qty 30, 30d supply, fill #0
  Filled 2023-03-02: qty 30, 30d supply, fill #1
  Filled 2023-04-02: qty 30, 30d supply, fill #2

## 2023-02-04 MED ORDER — OLANZAPINE 2.5 MG PO TABS
2.5000 mg | ORAL_TABLET | Freq: Every evening | ORAL | 2 refills | Status: DC
Start: 2023-02-04 — End: 2023-05-29
  Filled 2023-02-04: qty 30, 30d supply, fill #0
  Filled 2023-03-02: qty 30, 30d supply, fill #1
  Filled 2023-04-02: qty 30, 30d supply, fill #2

## 2023-02-04 MED ORDER — ALPRAZOLAM 0.5 MG PO TABS
0.5000 mg | ORAL_TABLET | Freq: Every evening | ORAL | 2 refills | Status: DC | PRN
Start: 2023-02-04 — End: 2023-05-29
  Filled 2023-02-04: qty 30, 30d supply, fill #0
  Filled 2023-04-02: qty 30, 30d supply, fill #1
  Filled 2023-05-12: qty 30, 30d supply, fill #2

## 2023-02-04 MED ORDER — VORTIOXETINE HBR 20 MG PO TABS
20.0000 mg | ORAL_TABLET | Freq: Every day | ORAL | 2 refills | Status: DC
Start: 2023-02-04 — End: 2023-05-29
  Filled 2023-02-04: qty 30, 30d supply, fill #0
  Filled 2023-03-02: qty 30, 30d supply, fill #1
  Filled 2023-04-02: qty 30, 30d supply, fill #2
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
## 2023-02-05 ENCOUNTER — Other Ambulatory Visit (HOSPITAL_COMMUNITY): Payer: Self-pay

## 2023-02-05 ENCOUNTER — Encounter: Payer: Self-pay | Admitting: Cardiology

## 2023-02-05 ENCOUNTER — Other Ambulatory Visit: Payer: Self-pay | Admitting: Cardiology

## 2023-02-05 MED ORDER — METOPROLOL SUCCINATE ER 50 MG PO TB24
50.0000 mg | ORAL_TABLET | Freq: Every day | ORAL | 0 refills | Status: DC
  Filled 2023-02-05 (×2): qty 90, 90d supply, fill #0

## 2023-02-10 DIAGNOSIS — R519 Headache, unspecified: Secondary | ICD-10-CM | POA: Diagnosis not present

## 2023-02-10 DIAGNOSIS — S134XXA Sprain of ligaments of cervical spine, initial encounter: Secondary | ICD-10-CM | POA: Diagnosis not present

## 2023-02-10 DIAGNOSIS — M542 Cervicalgia: Secondary | ICD-10-CM | POA: Diagnosis not present

## 2023-02-10 DIAGNOSIS — M549 Dorsalgia, unspecified: Secondary | ICD-10-CM | POA: Diagnosis not present

## 2023-02-10 DIAGNOSIS — M47816 Spondylosis without myelopathy or radiculopathy, lumbar region: Secondary | ICD-10-CM | POA: Diagnosis not present

## 2023-02-18 ENCOUNTER — Ambulatory Visit: Payer: 59 | Admitting: Physician Assistant

## 2023-02-20 ENCOUNTER — Emergency Department (HOSPITAL_COMMUNITY)
Admission: EM | Admit: 2023-02-20 | Discharge: 2023-02-20 | Disposition: A | Discharge status: Home / Self Care | Payer: 59 | Attending: Emergency Medicine | Admitting: Emergency Medicine

## 2023-02-20 ENCOUNTER — Other Ambulatory Visit: Payer: Self-pay

## 2023-02-20 ENCOUNTER — Emergency Department (HOSPITAL_COMMUNITY): Payer: 59

## 2023-02-20 DIAGNOSIS — Y92410 Unspecified street and highway as the place of occurrence of the external cause: Secondary | ICD-10-CM | POA: Insufficient documentation

## 2023-02-20 DIAGNOSIS — M25552 Pain in left hip: Secondary | ICD-10-CM | POA: Diagnosis not present

## 2023-02-20 DIAGNOSIS — S161XXA Strain of muscle, fascia and tendon at neck level, initial encounter: Secondary | ICD-10-CM

## 2023-02-20 DIAGNOSIS — Z79899 Other long term (current) drug therapy: Secondary | ICD-10-CM | POA: Insufficient documentation

## 2023-02-20 DIAGNOSIS — Z7982 Long term (current) use of aspirin: Secondary | ICD-10-CM | POA: Diagnosis not present

## 2023-02-20 DIAGNOSIS — I639 Cerebral infarction, unspecified: Secondary | ICD-10-CM | POA: Diagnosis not present

## 2023-02-20 DIAGNOSIS — R9431 Abnormal electrocardiogram [ECG] [EKG]: Secondary | ICD-10-CM | POA: Diagnosis not present

## 2023-02-20 DIAGNOSIS — M25512 Pain in left shoulder: Secondary | ICD-10-CM

## 2023-02-20 DIAGNOSIS — Z7902 Long term (current) use of antithrombotics/antiplatelets: Secondary | ICD-10-CM | POA: Insufficient documentation

## 2023-02-20 DIAGNOSIS — F99 Mental disorder, not otherwise specified: Secondary | ICD-10-CM | POA: Diagnosis not present

## 2023-02-20 DIAGNOSIS — M549 Dorsalgia, unspecified: Secondary | ICD-10-CM | POA: Diagnosis not present

## 2023-02-20 DIAGNOSIS — M47814 Spondylosis without myelopathy or radiculopathy, thoracic region: Secondary | ICD-10-CM | POA: Diagnosis not present

## 2023-02-20 DIAGNOSIS — S0003XA Contusion of scalp, initial encounter: Secondary | ICD-10-CM

## 2023-02-20 DIAGNOSIS — S0990XA Unspecified injury of head, initial encounter: Secondary | ICD-10-CM | POA: Diagnosis present

## 2023-02-20 LAB — CBC
HCT: 36.8 % (ref 36.0–46.0)
Hemoglobin: 12 g/dL (ref 12.0–15.0)
MCH: 29.6 pg (ref 26.0–34.0)
MCHC: 32.6 g/dL (ref 30.0–36.0)
MCV: 90.6 fL (ref 80.0–100.0)
Platelets: 187 10*3/uL (ref 150–400)
RBC: 4.06 MIL/uL (ref 3.87–5.11)
RDW: 12.9 % (ref 11.5–15.5)
WBC: 4.3 10*3/uL (ref 4.0–10.5)
nRBC: 0 % (ref 0.0–0.2)

## 2023-02-20 LAB — BASIC METABOLIC PANEL
Anion gap: 4 — ABNORMAL LOW (ref 5–15)
BUN: 9 mg/dL (ref 6–20)
CO2: 28 mmol/L (ref 22–32)
Calcium: 8.7 mg/dL — ABNORMAL LOW (ref 8.9–10.3)
Chloride: 106 mmol/L (ref 98–111)
Creatinine, Ser: 0.97 mg/dL (ref 0.44–1.00)
GFR, Estimated: >60 mL/min (ref >60)
Glucose, Bld: 99 mg/dL (ref 70–99)
Potassium: 3.9 mmol/L (ref 3.5–5.1)
Sodium: 138 mmol/L (ref 135–145)

## 2023-02-20 MED ORDER — ACETAMINOPHEN 325 MG PO TABS
650.0000 mg | ORAL_TABLET | Freq: Once | ORAL | Status: AC
  Administered 2023-02-20: 650 mg via ORAL
  Filled 2023-02-20: qty 2
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
## 2023-02-21 ENCOUNTER — Telehealth: Payer: Self-pay | Admitting: Physician Assistant
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
## 2023-02-25 ENCOUNTER — Encounter: Payer: Self-pay | Admitting: Physician Assistant

## 2023-02-27 ENCOUNTER — Telehealth: Payer: Self-pay
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
## 2023-03-02 ENCOUNTER — Other Ambulatory Visit (HOSPITAL_COMMUNITY): Payer: Self-pay

## 2023-03-06 ENCOUNTER — Ambulatory Visit: Payer: 59 | Admitting: Physician Assistant

## 2023-03-07 DIAGNOSIS — M1731 Unilateral post-traumatic osteoarthritis, right knee: Secondary | ICD-10-CM | POA: Diagnosis not present

## 2023-03-07 DIAGNOSIS — Z9889 Other specified postprocedural states: Secondary | ICD-10-CM | POA: Diagnosis not present

## 2023-03-11 ENCOUNTER — Other Ambulatory Visit (HOSPITAL_COMMUNITY): Payer: Self-pay

## 2023-03-11 ENCOUNTER — Ambulatory Visit (INDEPENDENT_AMBULATORY_CARE_PROVIDER_SITE_OTHER): Payer: 59

## 2023-03-11 VITALS — BP 130/80 | HR 96 | Temp 97.2°F | Resp 12 | Ht 64.0 in | Wt 226.0 lb

## 2023-03-11 DIAGNOSIS — M545 Low back pain, unspecified: Secondary | ICD-10-CM

## 2023-03-11 DIAGNOSIS — G43119 Migraine with aura, intractable, without status migrainosus: Secondary | ICD-10-CM

## 2023-03-11 DIAGNOSIS — I471 Supraventricular tachycardia, unspecified: Secondary | ICD-10-CM | POA: Diagnosis not present

## 2023-03-11 DIAGNOSIS — B009 Herpesviral infection, unspecified: Secondary | ICD-10-CM | POA: Diagnosis not present

## 2023-03-11 DIAGNOSIS — G8929 Other chronic pain: Secondary | ICD-10-CM

## 2023-03-11 MED ORDER — VALACYCLOVIR HCL 500 MG PO TABS
500.0000 mg | ORAL_TABLET | Freq: Two times a day (BID) | ORAL | 1 refills | Status: DC
Start: 2023-03-11 — End: 2023-06-30
  Filled 2023-03-11: qty 10, 5d supply, fill #0
  Filled 2023-04-02: qty 10, 5d supply, fill #1

## 2023-03-11 MED ORDER — METHOCARBAMOL 750 MG PO TABS
750.0000 mg | ORAL_TABLET | Freq: Three times a day (TID) | ORAL | 0 refills | Status: AC | PRN
  Filled 2023-03-11: qty 90, 30d supply, fill #0

## 2023-03-11 NOTE — Assessment & Plan Note (Signed)
Stable. On Ubrogepant 100 mg, half to tab as needed for abortive treatment and Emgality for preventive treatment.

## 2023-03-11 NOTE — Assessment & Plan Note (Signed)
History of chronic low back pain, reports injections in the low back in the past Requests to see the same pain clinic she has gone before, in [place]  Referral placed.
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
## 2023-03-11 NOTE — Assessment & Plan Note (Signed)
Stable. No reported tachycardia. On metoprolol Follows up with cardiology

## 2023-03-11 NOTE — Assessment & Plan Note (Signed)
Continues to report neck, upper back and low back pain Seeing chiropractic Not keen on PT referral  PLAN: pain clinic referral placed Sent methocarbamol 750 mg TID prn pain

## 2023-03-11 NOTE — Patient Instructions (Signed)
Pain clinic referral sent Methocarbamol sent for muscle spasms No need of blood work today Recommend regular stretching Follow up for wellness as scheduled

## 2023-03-11 NOTE — Telephone Encounter (Signed)
Patient ha cone insurance and don't cover wegovy nor zepbound for weight loss. Will cover ozempic for diabetes.

## 2023-03-19 ENCOUNTER — Ambulatory Visit (INDEPENDENT_AMBULATORY_CARE_PROVIDER_SITE_OTHER): Payer: 59

## 2023-03-19 ENCOUNTER — Other Ambulatory Visit (HOSPITAL_COMMUNITY): Payer: Self-pay

## 2023-03-19 VITALS — BP 118/82 | HR 89 | Temp 97.5°F | Ht 64.0 in | Wt 226.0 lb

## 2023-03-19 DIAGNOSIS — E8941 Symptomatic postprocedural ovarian failure: Secondary | ICD-10-CM | POA: Diagnosis not present

## 2023-03-19 DIAGNOSIS — G43909 Migraine, unspecified, not intractable, without status migrainosus: Secondary | ICD-10-CM

## 2023-03-19 DIAGNOSIS — Z1231 Encounter for screening mammogram for malignant neoplasm of breast: Secondary | ICD-10-CM | POA: Diagnosis not present

## 2023-03-19 DIAGNOSIS — E66812 Obesity, class 2: Secondary | ICD-10-CM | POA: Insufficient documentation

## 2023-03-19 DIAGNOSIS — E669 Obesity, unspecified: Secondary | ICD-10-CM | POA: Insufficient documentation

## 2023-03-19 DIAGNOSIS — Z0000 Encounter for general adult medical examination without abnormal findings: Secondary | ICD-10-CM | POA: Insufficient documentation

## 2023-03-19 DIAGNOSIS — G43011 Migraine without aura, intractable, with status migrainosus: Secondary | ICD-10-CM | POA: Diagnosis not present

## 2023-03-19 MED ORDER — KETOROLAC TROMETHAMINE 60 MG/2ML IM SOLN
60.0000 mg | Freq: Once | INTRAMUSCULAR | Status: AC
Start: 2023-03-19 — End: 2023-03-19
  Administered 2023-03-19: 60 mg via INTRAMUSCULAR

## 2023-03-19 MED ORDER — CONTRAVE 8-90 MG PO TB12
ORAL_TABLET | ORAL | 1 refills | Status: DC
Start: 2023-03-19 — End: 2023-06-03
  Filled 2023-03-19: qty 120, 30d supply, fill #0
  Filled 2023-04-28 – 2023-05-09 (×2): qty 120, 30d supply, fill #1

## 2023-03-19 MED ORDER — PROMETHAZINE HCL 25 MG PO TABS
25.0000 mg | ORAL_TABLET | Freq: Once | ORAL | Status: DC
Start: 2023-03-19 — End: 2023-03-19

## 2023-03-19 MED ORDER — PROMETHAZINE HCL 25 MG/ML IJ SOLN
25.0000 mg | Freq: Once | INTRAMUSCULAR | Status: DC
Start: 2023-03-19 — End: 2023-03-19

## 2023-03-19 MED ORDER — PROMETHAZINE HCL 25 MG/ML IJ SOLN
25.0000 mg | Freq: Once | INTRAMUSCULAR | Status: AC
Start: 2023-03-19 — End: 2023-03-19
  Administered 2023-03-19: 25 mg via INTRAMUSCULAR
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
## 2023-03-19 NOTE — Assessment & Plan Note (Signed)
Reports intractable headache for at least 4 to 5 days now Has been taking her Bernita Raisin with no relief Unable to get Emgality due to insurance noncoverage  Headache triggers could be her arthralgias, neck and upper back pain and her insomnia  Plan: Patient received Phenergan IM injection and Toradol IM injection in office today to help with her headaches -Advised to stay well-hydrated, sleep well -To call 911 for any severe headache.

## 2023-03-19 NOTE — Assessment & Plan Note (Signed)
Ordered screening mammogram as she is due for 1

## 2023-03-19 NOTE — Assessment & Plan Note (Signed)
Well adult exam performed today Ordered screening mammogram No Pap smear needed as she is status post hysterectomy Other age-appropriate anticipatory guidance provided No blood work needed either Return for wellness exam in 1 year or sooner if any concerns

## 2023-03-19 NOTE — Patient Instructions (Addendum)
Wellness exam done Ordered a mammogram  Sent contrave for weight management For the severe headache, phenergan and Toradol injections in office today Stay hydrated. If any severe headache- call 911

## 2023-03-19 NOTE — Assessment & Plan Note (Signed)
Reports hot flashes and night sweats.  She has had hysterectomy several years ago and 1 ovary removed.  Wants to know if she is in menopause  Plan: Ordered FSH and LH to check for hormone levels and rule out menopause

## 2023-03-20 ENCOUNTER — Other Ambulatory Visit (HOSPITAL_COMMUNITY): Payer: Self-pay

## 2023-03-20 LAB — FSH/LH
FSH: 72.2 m[IU]/mL
LH: 45.8 m[IU]/mL

## 2023-03-20 MED ORDER — EMGALITY 120 MG/ML ~~LOC~~ SOAJ
120.0000 mg | SUBCUTANEOUS | 2 refills | Status: DC
Start: 2023-03-20 — End: 2023-12-02
  Filled 2023-03-20: qty 3, 90d supply, fill #0
  Filled 2023-06-17: qty 3, 90d supply, fill #1
  Filled 2023-09-12: qty 3, 90d supply, fill #2

## 2023-03-20 MED ORDER — PROMETHAZINE HCL 25 MG PO TABS
25.0000 mg | ORAL_TABLET | Freq: Three times a day (TID) | ORAL | 0 refills | Status: DC | PRN
  Filled 2023-03-20: qty 20, 7d supply, fill #0

## 2023-03-21 ENCOUNTER — Other Ambulatory Visit (HOSPITAL_COMMUNITY): Payer: Self-pay

## 2023-03-22 ENCOUNTER — Other Ambulatory Visit (HOSPITAL_COMMUNITY): Payer: Self-pay

## 2023-03-24 DIAGNOSIS — M1991 Primary osteoarthritis, unspecified site: Secondary | ICD-10-CM

## 2023-03-25 ENCOUNTER — Other Ambulatory Visit: Payer: Self-pay

## 2023-03-25 ENCOUNTER — Other Ambulatory Visit (HOSPITAL_COMMUNITY): Payer: Self-pay

## 2023-03-25 MED ORDER — DICLOFENAC SODIUM 1 % EX GEL
2.0000 g | Freq: Four times a day (QID) | CUTANEOUS | 2 refills | Status: AC
  Filled 2023-03-25: qty 100, 13d supply, fill #0

## 2023-03-25 MED ORDER — DICLOFENAC SODIUM 1 % EX GEL
2.0000 g | Freq: Three times a day (TID) | CUTANEOUS | Status: AC
Start: 2023-03-25 — End: 2023-04-24

## 2023-03-25 MED ORDER — DICLOFENAC SODIUM 50 MG PO TBEC
50.0000 mg | DELAYED_RELEASE_TABLET | Freq: Two times a day (BID) | ORAL | 2 refills | Status: AC | PRN
  Filled 2023-03-25: qty 60, 30d supply, fill #0
  Filled 2023-05-14: qty 60, 30d supply, fill #1

## 2023-03-26 ENCOUNTER — Other Ambulatory Visit (HOSPITAL_COMMUNITY): Payer: Self-pay

## 2023-04-02 ENCOUNTER — Other Ambulatory Visit: Payer: Self-pay | Admitting: Physician Assistant

## 2023-04-02 ENCOUNTER — Other Ambulatory Visit (HOSPITAL_COMMUNITY): Payer: Self-pay

## 2023-04-02 DIAGNOSIS — G619 Inflammatory polyneuropathy, unspecified: Secondary | ICD-10-CM

## 2023-04-02 MED ORDER — GABAPENTIN 600 MG PO TABS
600.0000 mg | ORAL_TABLET | Freq: Every day | ORAL | 0 refills | Status: DC
Start: 2023-04-02 — End: 2023-06-30
  Filled 2023-04-02: qty 90, 90d supply, fill #0

## 2023-04-03 ENCOUNTER — Other Ambulatory Visit: Payer: Self-pay

## 2023-04-03 ENCOUNTER — Other Ambulatory Visit (HOSPITAL_COMMUNITY): Payer: Self-pay

## 2023-04-03 MED ORDER — LORAZEPAM 2 MG PO TABS
ORAL_TABLET | ORAL | 0 refills | Status: DC
  Filled 2023-04-03: qty 1, 1d supply, fill #0

## 2023-04-05 ENCOUNTER — Ambulatory Visit: Payer: 59 | Admitting: Physical Medicine & Rehabilitation

## 2023-04-05 ENCOUNTER — Other Ambulatory Visit (HOSPITAL_COMMUNITY): Payer: Self-pay

## 2023-04-10 ENCOUNTER — Ambulatory Visit
Admission: RE | Admit: 2023-04-10 | Discharge: 2023-04-10 | Disposition: A | Discharge status: Home / Self Care | Payer: 59 | Source: Ambulatory Visit

## 2023-04-10 DIAGNOSIS — Z1231 Encounter for screening mammogram for malignant neoplasm of breast: Secondary | ICD-10-CM

## 2023-04-12 ENCOUNTER — Encounter: Payer: Self-pay | Admitting: Physical Medicine & Rehabilitation

## 2023-04-12 ENCOUNTER — Encounter: Payer: Self-pay | Admitting: Nurse Practitioner

## 2023-04-16 ENCOUNTER — Encounter: Payer: 59 | Admitting: Physical Medicine & Rehabilitation

## 2023-04-22 ENCOUNTER — Telehealth: Payer: Self-pay

## 2023-04-22 NOTE — Telephone Encounter (Signed)
[facility]

## 2023-04-24 ENCOUNTER — Ambulatory Visit: Payer: 59 | Admitting: Nurse Practitioner

## 2023-04-25 DIAGNOSIS — Z4889 Encounter for other specified surgical aftercare: Secondary | ICD-10-CM | POA: Diagnosis not present

## 2023-04-25 DIAGNOSIS — M25561 Pain in right knee: Secondary | ICD-10-CM | POA: Diagnosis not present

## 2023-05-07 ENCOUNTER — Telehealth (HOSPITAL_COMMUNITY): Payer: 59 | Admitting: Psychiatry

## 2023-05-08 ENCOUNTER — Other Ambulatory Visit (HOSPITAL_COMMUNITY): Payer: Self-pay

## 2023-05-08 DIAGNOSIS — I10 Essential (primary) hypertension: Secondary | ICD-10-CM | POA: Insufficient documentation

## 2023-05-08 NOTE — Progress Notes (Unsigned)
Cardiology Office Note:   Date:  05/08/2023  ID:  Desiree Russell, DOB [birth year], MRN [id number] PCP: Windell Moment, MD  [facility] Providers Cardiologist:  Rollene Rotunda, MD {  History of Present Illness:   Desiree Russell is a 52 y.o. female who was referred by Janie Morning, NP for evaluation of pericardial effusion.  This was noted on an echocardiogram at [facility]   The patient has a complicated history and has been at [facility]  and [facility].   She was at [facility] .   She was hospitalized and treated at [facility] for pericarditis.  This was in 2019.  Most recently she was hospitalized in June 2022 at [facility].   There was a questionable CVA.  MRI showed an acute early or subacute infarct in the right parietal lobe although it was not entirely clear that all her symptoms are related to this.  As part of this work-up she had an echocardiogram.  I was interested to see this as there was no evidence of the previously mentioned pericardial effusion.  There was an otherwise unremarkable echocardiogram.  She was in the emergency room in August 2022 with lightheadedness, difficulty with word finding.  However, there were no acute findings on MRI.      Since I last saw her ***  *** she has had no new cardiac complaints.  She is interested perhaps dropping some of her medications.  She has injured her knee and she is actually going to get arthroscopic surgery in a couple of days.  She had been doing okay prior to this.  She says her blood pressure has been well-controlled and she shows me a diary.  She has not been having the palpitations that she was having.  She has not been having any new shortness of breath, PND orthopnea.  She has had no chest pressure.  Her weight is up because she has not been able to be as active.  ROS: ***  Studies Reviewed:    EKG:       ***  Risk Assessment/Calculations:   {Does this patient have ATRIAL FIBRILLATION?:[phone]} No  BP recorded.  {Refresh Note OR Click here to enter BP  :1}***        Physical Exam:   VS:  There were no vitals taken for this visit.   Wt Readings from Last 3 Encounters:  03/19/23 226 lb (102.5 kg)  03/11/23 226 lb (102.5 kg)  02/20/23 223 lb (101.2 kg)     GEN: Well nourished, well developed in no acute distress NECK: No JVD; No carotid bruits CARDIAC: ***RRR, no murmurs, rubs, gallops RESPIRATORY:  Clear to auscultation without rales, wheezing or rhonchi  ABDOMEN: Soft, non-tender, non-distended EXTREMITIES:  No edema; No deformity   ASSESSMENT AND PLAN:   PERICARDIAL EFFUSION:    ***   She had no evidence of this on follow-up echo in 2022 and no physical exam evidence of this.  No further imaging   PALPITATIONS:   ***  She is not having these any longer and she would like to reduce her medication so we will start Toprol-XL 25.    DYSLIPIDEMIA: LDL was *** mildly elevated but she has been dieting and she did not have any coronary calcium noted on the CT so I will discontinue her Crestor and attempt to reduce polypharmacy.   HTN: Blood pressure is *** controlled and I think she can stop the metoprolol as above and just keep an eye  on this.      {Are you ordering a CV Procedure (e.g. stress test, cath, DCCV, TEE, etc)?   Press F2        :[id number]}  Follow up ***  Signed, Rollene Rotunda, MD

## 2023-05-09 ENCOUNTER — Encounter: Payer: Self-pay | Admitting: Cardiology

## 2023-05-09 ENCOUNTER — Other Ambulatory Visit (HOSPITAL_COMMUNITY): Payer: Self-pay

## 2023-05-09 ENCOUNTER — Ambulatory Visit: Payer: 59 | Attending: Cardiology | Admitting: Cardiology

## 2023-05-09 VITALS — BP 128/82 | HR 83 | Ht 64.0 in | Wt 222.0 lb

## 2023-05-09 DIAGNOSIS — E785 Hyperlipidemia, unspecified: Secondary | ICD-10-CM

## 2023-05-09 DIAGNOSIS — I10 Essential (primary) hypertension: Secondary | ICD-10-CM | POA: Diagnosis not present

## 2023-05-09 DIAGNOSIS — I3139 Other pericardial effusion (noninflammatory): Secondary | ICD-10-CM

## 2023-05-09 DIAGNOSIS — R002 Palpitations: Secondary | ICD-10-CM | POA: Diagnosis not present

## 2023-05-12 ENCOUNTER — Other Ambulatory Visit (HOSPITAL_COMMUNITY): Payer: Self-pay | Admitting: Psychiatry

## 2023-05-12 ENCOUNTER — Other Ambulatory Visit: Payer: Self-pay | Admitting: Cardiology

## 2023-05-12 DIAGNOSIS — F419 Anxiety disorder, unspecified: Secondary | ICD-10-CM

## 2023-05-12 DIAGNOSIS — F4310 Post-traumatic stress disorder, unspecified: Secondary | ICD-10-CM

## 2023-05-12 DIAGNOSIS — F331 Major depressive disorder, recurrent, moderate: Secondary | ICD-10-CM

## 2023-05-13 ENCOUNTER — Other Ambulatory Visit: Payer: Self-pay

## 2023-05-14 ENCOUNTER — Other Ambulatory Visit (HOSPITAL_COMMUNITY): Payer: Self-pay

## 2023-05-14 ENCOUNTER — Other Ambulatory Visit: Payer: Self-pay

## 2023-05-14 MED ORDER — METOPROLOL SUCCINATE ER 50 MG PO TB24
50.0000 mg | ORAL_TABLET | Freq: Every day | ORAL | 3 refills | Status: DC
  Filled 2023-05-14: qty 90, 90d supply, fill #0
  Filled 2023-08-13: qty 90, 90d supply, fill #1
  Filled 2023-11-18 – 2023-11-29 (×2): qty 90, 90d supply, fill #2
  Filled 2024-02-24: qty 90, 90d supply, fill #3

## 2023-05-23 ENCOUNTER — Other Ambulatory Visit (HOSPITAL_COMMUNITY): Payer: Self-pay

## 2023-05-23 MED ORDER — VEOZAH 45 MG PO TABS
1.0000 | ORAL_TABLET | Freq: Every day | ORAL | 2 refills | Status: DC
  Filled 2023-05-23: qty 30, 30d supply, fill #0
  Filled 2023-06-17: qty 30, 30d supply, fill #1
  Filled 2023-07-22: qty 30, 30d supply, fill #2

## 2023-05-24 ENCOUNTER — Other Ambulatory Visit (HOSPITAL_COMMUNITY): Payer: Self-pay

## 2023-05-29 ENCOUNTER — Other Ambulatory Visit (HOSPITAL_COMMUNITY): Payer: Self-pay

## 2023-05-29 ENCOUNTER — Encounter (HOSPITAL_COMMUNITY): Payer: Self-pay

## 2023-05-29 DIAGNOSIS — F4310 Post-traumatic stress disorder, unspecified: Secondary | ICD-10-CM

## 2023-05-29 DIAGNOSIS — F331 Major depressive disorder, recurrent, moderate: Secondary | ICD-10-CM

## 2023-05-29 DIAGNOSIS — F419 Anxiety disorder, unspecified: Secondary | ICD-10-CM

## 2023-05-29 MED ORDER — ALPRAZOLAM 0.5 MG PO TABS
0.5000 mg | ORAL_TABLET | Freq: Every evening | ORAL | 0 refills | Status: DC | PRN
Start: 2023-05-29 — End: 2023-08-22
  Filled 2023-06-25: qty 30, 30d supply, fill #0

## 2023-05-29 MED ORDER — OLANZAPINE 2.5 MG PO TABS
2.5000 mg | ORAL_TABLET | Freq: Every evening | ORAL | 0 refills | Status: DC
Start: 2023-05-29 — End: 2023-06-17
  Filled 2023-05-29: qty 30, 30d supply, fill #0

## 2023-05-29 MED ORDER — VORTIOXETINE HBR 20 MG PO TABS
20.0000 mg | ORAL_TABLET | Freq: Every day | ORAL | 0 refills | Status: DC
Start: 2023-05-29 — End: 2023-06-17
  Filled 2023-05-29: qty 30, 30d supply, fill #0

## 2023-05-29 MED ORDER — FLUOXETINE HCL 20 MG PO CAPS
20.0000 mg | ORAL_CAPSULE | Freq: Every evening | ORAL | 0 refills | Status: DC
  Filled 2023-05-29: qty 30, 30d supply, fill #0

## 2023-05-30 ENCOUNTER — Other Ambulatory Visit (HOSPITAL_COMMUNITY): Payer: Self-pay

## 2023-06-03 ENCOUNTER — Other Ambulatory Visit: Payer: Self-pay | Admitting: Physician Assistant

## 2023-06-03 ENCOUNTER — Other Ambulatory Visit: Payer: Self-pay

## 2023-06-03 ENCOUNTER — Other Ambulatory Visit (HOSPITAL_COMMUNITY): Payer: Self-pay

## 2023-06-03 DIAGNOSIS — Z8673 Personal history of transient ischemic attack (TIA), and cerebral infarction without residual deficits: Secondary | ICD-10-CM

## 2023-06-03 DIAGNOSIS — R110 Nausea: Secondary | ICD-10-CM

## 2023-06-03 MED ORDER — ONDANSETRON HCL 4 MG PO TABS
4.0000 mg | ORAL_TABLET | Freq: Three times a day (TID) | ORAL | 3 refills | Status: DC | PRN
Start: 2023-06-03 — End: 2023-12-12
  Filled 2023-06-03: qty 30, 10d supply, fill #0

## 2023-06-03 MED ORDER — CONTRAVE 8-90 MG PO TB12
ORAL_TABLET | ORAL | 1 refills | Status: DC
  Filled 2023-06-03: qty 120, 30d supply, fill #0
  Filled 2023-07-22: qty 120, 30d supply, fill #1

## 2023-06-03 MED ORDER — CLOPIDOGREL BISULFATE 75 MG PO TABS
75.0000 mg | ORAL_TABLET | Freq: Every day | ORAL | 0 refills | Status: DC
Start: 2023-06-03 — End: 2023-10-24
  Filled 2023-06-03: qty 90, 90d supply, fill #0

## 2023-06-05 ENCOUNTER — Other Ambulatory Visit (HOSPITAL_COMMUNITY): Payer: Self-pay

## 2023-06-05 MED ORDER — AMOXICILLIN 500 MG PO CAPS
500.0000 mg | ORAL_CAPSULE | Freq: Three times a day (TID) | ORAL | 0 refills | Status: DC
  Filled 2023-06-05: qty 21, 7d supply, fill #0

## 2023-06-05 MED ORDER — HYDROCODONE-ACETAMINOPHEN 5-325 MG PO TABS
1.0000 | ORAL_TABLET | Freq: Four times a day (QID) | ORAL | 0 refills | Status: DC | PRN
  Filled 2023-06-05: qty 8, 2d supply, fill #0

## 2023-06-12 ENCOUNTER — Other Ambulatory Visit (HOSPITAL_COMMUNITY): Payer: Self-pay

## 2023-06-12 MED ORDER — TRAMADOL HCL 50 MG PO TABS
50.0000 mg | ORAL_TABLET | Freq: Four times a day (QID) | ORAL | 0 refills | Status: DC | PRN
  Filled 2023-06-12: qty 4, 1d supply, fill #0

## 2023-06-17 ENCOUNTER — Encounter (HOSPITAL_COMMUNITY): Payer: Self-pay | Admitting: Psychiatry

## 2023-06-17 ENCOUNTER — Telehealth (HOSPITAL_BASED_OUTPATIENT_CLINIC_OR_DEPARTMENT_OTHER): Payer: 59 | Admitting: Psychiatry

## 2023-06-17 ENCOUNTER — Other Ambulatory Visit (HOSPITAL_COMMUNITY): Payer: Self-pay

## 2023-06-17 VITALS — Wt 222.0 lb

## 2023-06-17 DIAGNOSIS — F331 Major depressive disorder, recurrent, moderate: Secondary | ICD-10-CM

## 2023-06-17 DIAGNOSIS — F419 Anxiety disorder, unspecified: Secondary | ICD-10-CM | POA: Diagnosis not present

## 2023-06-17 DIAGNOSIS — F4310 Post-traumatic stress disorder, unspecified: Secondary | ICD-10-CM | POA: Diagnosis not present

## 2023-06-17 MED ORDER — FLUOXETINE HCL 20 MG PO CAPS
20.0000 mg | ORAL_CAPSULE | Freq: Every evening | ORAL | 0 refills | Status: DC
  Filled 2023-06-17 – 2023-06-25 (×2): qty 90, 90d supply, fill #0

## 2023-06-17 MED ORDER — VORTIOXETINE HBR 20 MG PO TABS
20.0000 mg | ORAL_TABLET | Freq: Every day | ORAL | 2 refills | Status: DC
  Filled 2023-06-17 – 2023-06-25 (×2): qty 30, 30d supply, fill #0

## 2023-06-17 MED ORDER — OLANZAPINE 2.5 MG PO TABS
2.5000 mg | ORAL_TABLET | Freq: Every evening | ORAL | 0 refills | Status: DC
  Filled 2023-06-17 – 2023-06-25 (×2): qty 90, 90d supply, fill #0
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
## 2023-06-18 ENCOUNTER — Other Ambulatory Visit: Payer: Self-pay

## 2023-06-18 ENCOUNTER — Other Ambulatory Visit (HOSPITAL_COMMUNITY): Payer: Self-pay

## 2023-06-20 ENCOUNTER — Other Ambulatory Visit (HOSPITAL_COMMUNITY): Payer: Self-pay

## 2023-06-20 ENCOUNTER — Ambulatory Visit: Payer: 59

## 2023-06-20 VITALS — BP 122/76 | HR 80 | Temp 97.8°F | Resp 16 | Ht 64.0 in | Wt 229.2 lb

## 2023-06-20 DIAGNOSIS — B372 Candidiasis of skin and nail: Secondary | ICD-10-CM | POA: Insufficient documentation

## 2023-06-20 MED ORDER — NYSTATIN 100000 UNIT/GM EX POWD
1.0000 | Freq: Three times a day (TID) | CUTANEOUS | 2 refills | Status: AC
  Filled 2023-06-20: qty 60, 20d supply, fill #0

## 2023-06-20 NOTE — Patient Instructions (Signed)
Appears to be a yeast infection between skin folds Sent NYSTATIN powder to apply upto 3 times daily Call me if this does not help. 4. Try cotton underpants and try to minimize sweats.

## 2023-06-24 NOTE — Assessment & Plan Note (Signed)
Sent prescription for nystatin powder, to apply 3 times daily for couple weeks Advised to control sweating by wearing cotton underpants and cotton brassiers. Advised to work on [employer] management Could try drysol solution if absolutely needed Report back if any worsening symptoms

## 2023-06-24 NOTE — Progress Notes (Signed)
   Established Patient Office Visit  Subjective   Patient ID: Desiree Russell, female    DOB: June 02, 1971  Age: 52 y.o. MRN: [id number]  Chief Complaint  Patient presents with   Rash    Rash    patient is here with complaints of rash under her breasts, in her abdominal folds and her groin. Rash is painful and raw. Has applied hydrocortisone cream with no relief. Does Report excessive sweating in those areas    Review of Systems  Constitutional:        Positive for rash  Skin:  Positive for rash.      Objective:     BP 122/76 (BP Location: Right Arm, Patient Position: Sitting, Cuff Size: Large)   Pulse 80   Temp 97.8 F (36.6 C) (Temporal)   Resp 16   Ht 5\' 4"  (1.626 m)   Wt 229 lb 3.2 oz (104 kg)   SpO2 99%   BMI 39.34 kg/m    Physical Exam Vitals and nursing note reviewed.  Cardiovascular:     Rate and Rhythm: Normal rate and regular rhythm.  Pulmonary:     Effort: Pulmonary effort is normal.     Breath sounds: Normal breath sounds.  Skin:    Comments: Erythematous rash noted in the skin folds, under her breasts and groin, most likely consistent with Candida intertigo  Neurological:     Mental Status: She is alert.      No results found for any visits on 06/20/23.    The ASCVD Risk score (Arnett DK, et al., 2019) failed to calculate for the following reasons:   The patient has a prior MI or stroke diagnosis    Assessment & Plan:   Problem List Items Addressed This Visit     Candidal intertrigo - Primary    Sent prescription for nystatin powder, to apply 3 times daily for couple weeks Advised to control sweating by wearing cotton underpants and cotton brassiers. Advised to work on [employer] management Could try drysol solution if absolutely needed Report back if any worsening symptoms      Relevant Medications   nystatin (MYCOSTATIN/NYSTOP) powder    No follow-ups on file.    Windell Moment, MD

## 2023-06-26 ENCOUNTER — Other Ambulatory Visit (HOSPITAL_COMMUNITY): Payer: Self-pay

## 2023-06-26 ENCOUNTER — Other Ambulatory Visit: Payer: Self-pay

## 2023-06-30 ENCOUNTER — Other Ambulatory Visit: Payer: Self-pay

## 2023-06-30 DIAGNOSIS — G619 Inflammatory polyneuropathy, unspecified: Secondary | ICD-10-CM

## 2023-06-30 DIAGNOSIS — B009 Herpesviral infection, unspecified: Secondary | ICD-10-CM

## 2023-07-01 ENCOUNTER — Other Ambulatory Visit: Payer: Self-pay

## 2023-07-01 ENCOUNTER — Other Ambulatory Visit (HOSPITAL_COMMUNITY): Payer: Self-pay

## 2023-07-01 MED ORDER — GABAPENTIN 600 MG PO TABS
600.0000 mg | ORAL_TABLET | Freq: Every day | ORAL | 0 refills | Status: DC
  Filled 2023-07-01: qty 90, 90d supply, fill #0

## 2023-07-01 MED ORDER — VALACYCLOVIR HCL 500 MG PO TABS
500.0000 mg | ORAL_TABLET | Freq: Two times a day (BID) | ORAL | 1 refills | Status: DC
  Filled 2023-07-01: qty 10, 5d supply, fill #0
  Filled 2023-07-22: qty 10, 5d supply, fill #1

## 2023-07-02 ENCOUNTER — Other Ambulatory Visit: Payer: Self-pay

## 2023-07-02 ENCOUNTER — Other Ambulatory Visit (HOSPITAL_COMMUNITY): Payer: Self-pay

## 2023-07-02 ENCOUNTER — Other Ambulatory Visit: Payer: Self-pay | Admitting: Nurse Practitioner

## 2023-07-02 MED ORDER — PREGABALIN 75 MG PO CAPS
75.0000 mg | ORAL_CAPSULE | Freq: Two times a day (BID) | ORAL | 2 refills | Status: DC
  Filled 2023-07-02: qty 60, 30d supply, fill #0
  Filled 2023-07-29: qty 60, 30d supply, fill #1

## 2023-07-03 ENCOUNTER — Other Ambulatory Visit (HOSPITAL_COMMUNITY): Payer: Self-pay

## 2023-07-11 ENCOUNTER — Emergency Department (HOSPITAL_COMMUNITY)
Admission: EM | Admit: 2023-07-11 | Discharge: 2023-07-11 | Disposition: A | Discharge status: Home / Self Care | Payer: 59 | Attending: Emergency Medicine | Admitting: Emergency Medicine

## 2023-07-11 ENCOUNTER — Other Ambulatory Visit: Payer: Self-pay

## 2023-07-11 ENCOUNTER — Emergency Department (HOSPITAL_COMMUNITY): Payer: 59

## 2023-07-11 ENCOUNTER — Encounter (HOSPITAL_COMMUNITY): Payer: Self-pay

## 2023-07-11 DIAGNOSIS — Z7982 Long term (current) use of aspirin: Secondary | ICD-10-CM | POA: Insufficient documentation

## 2023-07-11 DIAGNOSIS — R55 Syncope and collapse: Secondary | ICD-10-CM | POA: Diagnosis not present

## 2023-07-11 DIAGNOSIS — I10 Essential (primary) hypertension: Secondary | ICD-10-CM | POA: Diagnosis not present

## 2023-07-11 DIAGNOSIS — R569 Unspecified convulsions: Secondary | ICD-10-CM | POA: Insufficient documentation

## 2023-07-11 DIAGNOSIS — Z7901 Long term (current) use of anticoagulants: Secondary | ICD-10-CM | POA: Insufficient documentation

## 2023-07-11 DIAGNOSIS — R0989 Other specified symptoms and signs involving the circulatory and respiratory systems: Secondary | ICD-10-CM | POA: Diagnosis not present

## 2023-07-11 DIAGNOSIS — Z79899 Other long term (current) drug therapy: Secondary | ICD-10-CM | POA: Insufficient documentation

## 2023-07-11 DIAGNOSIS — I517 Cardiomegaly: Secondary | ICD-10-CM | POA: Diagnosis not present

## 2023-07-11 LAB — CBC WITH DIFFERENTIAL/PLATELET
Abs Immature Granulocytes: 0.02 10*3/uL (ref 0.00–0.07)
Basophils Absolute: 0 10*3/uL (ref 0.0–0.1)
Basophils Relative: 1 %
Eosinophils Absolute: 0.1 10*3/uL (ref 0.0–0.5)
Eosinophils Relative: 2 %
HCT: 41.6 % (ref 36.0–46.0)
Hemoglobin: 13.4 g/dL (ref 12.0–15.0)
Immature Granulocytes: 0 %
Lymphocytes Relative: 44 %
Lymphs Abs: 2.5 10*3/uL (ref 0.7–4.0)
MCH: 29.3 pg (ref 26.0–34.0)
MCHC: 32.2 g/dL (ref 30.0–36.0)
MCV: 91 fL (ref 80.0–100.0)
Monocytes Absolute: 0.4 10*3/uL (ref 0.1–1.0)
Monocytes Relative: 8 %
Neutro Abs: 2.4 10*3/uL (ref 1.7–7.7)
Neutrophils Relative %: 45 %
Platelets: 216 10*3/uL (ref 150–400)
RBC: 4.57 MIL/uL (ref 3.87–5.11)
RDW: 13.5 % (ref 11.5–15.5)
WBC: 5.5 10*3/uL (ref 4.0–10.5)
nRBC: 0 % (ref 0.0–0.2)

## 2023-07-11 LAB — BASIC METABOLIC PANEL
Anion gap: 12 (ref 5–15)
BUN: 11 mg/dL (ref 6–20)
CO2: 24 mmol/L (ref 22–32)
Calcium: 9.5 mg/dL (ref 8.9–10.3)
Chloride: 102 mmol/L (ref 98–111)
Creatinine, Ser: 1.2 mg/dL — ABNORMAL HIGH (ref 0.44–1.00)
GFR, Estimated: 55 mL/min — ABNORMAL LOW (ref >60)
Glucose, Bld: 82 mg/dL (ref 70–99)
Potassium: 4.3 mmol/L (ref 3.5–5.1)
Sodium: 138 mmol/L (ref 135–145)

## 2023-07-11 LAB — CBG MONITORING, ED: Glucose-Capillary: 87 mg/dL (ref 70–99)

## 2023-07-11 MED ORDER — SODIUM CHLORIDE 0.9 % IV BOLUS: 1000.0000 mL | Freq: Once | INTRAVENOUS | Status: DC

## 2023-07-11 NOTE — ED Provider Notes (Signed)
  Physical Exam  BP 131/83   Pulse 77   Temp 98 F (36.7 C) (Oral)   Resp 18   Ht 5\' 4"  (1.626 m)   Wt 103.9 kg   SpO2 100%   BMI 39.31 kg/m   Physical Exam Vitals reviewed.  Constitutional:      General: She is not in acute distress.    Appearance: Normal appearance. She is not ill-appearing, toxic-appearing or diaphoretic.  Cardiovascular:     Rate and Rhythm: Normal rate.  Pulmonary:     Effort: Pulmonary effort is normal.  Neurological:     Mental Status: She is alert. Mental status is at baseline.     Procedures  Procedures  ED Course / MDM   Clinical Course as of 07/11/23 1746  Thu Jul 11, 2023  1533 Was feeling poorly, then collapsed. Was maybe shaking and unresponsive. 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 Radiology: ordered.   This patient was primarily seen by Dr. Jean Rosenthal.  I assumed care of this patient at the start of my shift.  Patient had a reported seizure-like activity that was witnessed at work.  She received a total of 10 of IM Versed with EMS.  After prolonged observation period, patient had not returned to her baseline.  Therefore, neurology was consulted.  They recommended additional 2 hours of observation.  Patient's workup reassuring and unrevealing.  If patient returns to baseline, she is okay for discharge with neurology follow-up.  If she does not return to baseline, consider admission.  On my reassessment of the patient, her husband is at bedside.  He reports that she is now back at her  baseline.  She is awake and conversant with me.  Her vitals are reassuring.  Workup here has been reassuring as well.  Patient is appropriate for discharge at this time.  I discussed return precautions with the patient and her husband, who expressed understanding.  Patient was informed that she should not drive until she follows up with her neurologist.  Patient discharged in stable condition.       Rolla Flatten, MD 07/12/23 [id number]    Charlynne Pander, MD 07/16/23 [phone]

## 2023-07-11 NOTE — ED Provider Notes (Deleted)
Patient presenting today after a witnessed loss of consciousness at her place of work.  Question whether this was a syncopal event versus seizures however there was some twitching noticed and patient does have a prior history of seizures.  Patient does not take antiepileptics.  However patient did have several episodes of twitching and concern for possible postictal period with EMS on the way to the emergency room receiving 10 mg of Versed and route.  Patient is not displaying any evidence of seizure-like activity at this time.  Vital signs are reassuring.  Patient is drowsy but does have meaningful movement.  EKG without acute findings today.  Will ensure normal electrolytes and labs.  Lower suspicion for dysrhythmia today.   Gwyneth Sprout, MD 07/11/23 2108

## 2023-07-11 NOTE — ED Triage Notes (Signed)
Patient witnessed by coworkers to have syncopal episode and shaking activity.  Patient has hx of seizures.  Upon ems arrival patient had GCS3 but came around and started seizing again so 5mg  versed given.  Patient then came around again and started seizing so another 5mg  versed given.  Patient now GCS 4  IV 20gLAC [id number]

## 2023-07-11 NOTE — ED Notes (Signed)
Pt provided with water and crackers.

## 2023-07-11 NOTE — ED Notes (Signed)
Pt refused fluids... EDP Jean Rosenthal aware. Pt is asking for crackers.

## 2023-07-11 NOTE — Discharge Instructions (Signed)
Please follow up with your neurologist soon as possible.  You should not drive until you see your neurologist.  Please return to the emergency department if you develop confusion, repeat seizure-like activity, troubles with your speech, new weakness.

## 2023-07-11 NOTE — ED Provider Notes (Signed)
[place] EMERGENCY DEPARTMENT AT [facility] Provider Note   CSN: [id number] Arrival date & time: 07/11/23  1318     History  Chief Complaint  Patient presents with   Seizures    Desiree Russell is a 51 y.o. female with a PMH of psychogenic nonepileptiform seizures versus seizures, anxiety, prior TIA/CVA, HTN who presented to the ED for seizure-like activity.  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  Coworkers reported that she appeared to lose consciousness and one of them caught her and lowered her to the ground.  Staff at the doctor's office reportedly initiated chest compressions, although it is unclear if the patient did not have a pulse at the time.  They reported they applied AED pads in the rhythm was not shockable but it did not say what it was.  EMS arrived and found the patient have a pulse and to be responsive to verbal stimuli.  They reported she had an episode of eye fluttering and rhythmic movements of her jaw that they were worried was a seizure, so they administered 5 mg IM Versed.  They reported this happened a few minutes later and they administered an additional 5 mg IM Versed.  Report the patient's fingerstick blood sugar was in the 130s.   Seizures      Home Medications Prior to Admission medications   Medication Sig Start Date End Date Taking? Authorizing Provider  acetaminophen (TYLENOL) 325 MG tablet Take 650 mg by mouth every 6 (six) hours as needed for mild pain, fever or headache.    [provider]  ALPRAZolam Prudy Feeler) 0.5 MG tablet Take 1 tablet (0.5 mg total) by mouth at bedtime as needed for anxiety. 05/29/23   Arfeen, Phillips Grout, MD  aspirin 81 MG EC tablet Take 1 tablet (81 mg total) by mouth daily. 01/23/21   Edsel Petrin, DO  Calcium-Vitamin D-Vitamin K 650-12.5-40 MG-MCG-MCG CHEW Chew 1 tablet by mouth daily.     [provider]  clopidogrel (PLAVIX) 75 MG tablet Take 1 tablet (75 mg total) by mouth once daily 06/03/23   Windell Moment, MD  Docusate Sodium (STOOL SOFTENER LAXATIVE PO)     [provider]  Fezolinetant (VEOZAH) 45 MG TABS Take 1 tablet (45 mg total) by mouth daily. 05/23/23 08/21/23  Windell Moment, MD  FLUoxetine (PROZAC) 20 MG capsule Take 1 capsule (20 mg total) by mouth every evening. 06/17/23   Arfeen, Phillips Grout, MD  furosemide (LASIX) 20 MG tablet Take 1 tablet by mouth daily as needed. 03/21/21 03/08/22  Rollene Rotunda, MD  Galcanezumab-gnlm (EMGALITY) 120 MG/ML SOAJ Inject 120 mg into the skin every 30 (thirty) days. 03/20/23 09/22/23  Windell Moment, MD  loratadine (CLARITIN) 10 MG tablet Take 1 tablet (10 mg total) by mouth daily. 12/07/21   Janie Morning, NP  LORazepam (ATIVAN) 2 MG tablet Take 1 tablet by mouth 1 hour before surgery 04/03/23     metoprolol succinate (TOPROL XL) 50 MG 24 hr tablet Take 1 tablet (50 mg total) by mouth at bedtime. 05/14/23   Rollene Rotunda, MD  metoprolol succinate (TOPROL-XL) 50 MG 24 hr tablet Take 50 mg by mouth daily. Take with or immediately following a meal.    [provider]  Multiple Vitamin (MULTIVITAMIN WITH MINERALS) TABS tablet Take 1 tablet by mouth daily.    [provider]  Multiple Vitamin (MULTIVITAMIN) capsule Take 1 capsule by mouth daily. 11/11/19   [provider]  Naltrexone-buPROPion HCl ER (CONTRAVE) 8-90 MG TB12 Start 1 tablet every morning for 7 days, then 1 tablet twice daily for 7 days, then 2 tablets every morning and one every evening 06/03/23   Windell Moment, MD  nystatin (MYCOSTATIN/NYSTOP) powder Apply topically 3 (three) times daily. 06/20/23 07/20/23  Sirivol, Kristie Cowman, MD  OLANZapine (ZYPREXA) 2.5 MG tablet Take 1 tablet (2.5 mg total) by mouth every evening. 06/17/23   Arfeen, Phillips Grout, MD  ondansetron (ZOFRAN) 4 MG tablet Take 1 tablet by mouth every 8 hours as needed  for nausea or vomiting. 06/03/23   Sirivol, Kristie Cowman, MD  pantoprazole (PROTONIX) 40 MG tablet Take 1 tablet by mouth daily. 08/02/22   Lynann Bologna, MD  pregabalin (LYRICA) 75 MG capsule Take 1 capsule (75 mg total) by mouth 2 (two) times daily. 07/02/23 09/30/23  Windell Moment, MD  promethazine (PHENERGAN) 25 MG tablet Take 1 tablet (25 mg total) by mouth every 8 (eight) hours as needed for nausea or vomiting. 03/20/23   Sirivol, Kristie Cowman, MD  senna-docusate (SENOKOT-S) 8.6-50 MG tablet Take 2 tablets by mouth daily. 02/07/22   Janie Morning, NP  traMADol (ULTRAM) 50 MG tablet Take 1 tablet (50 mg total) by mouth every 6 (six) hours as needed. 06/12/23     Ubrogepant (UBRELVY) 100 MG TABS TAKE  1/2 TABLET BY MOUTH AT HEADACHE ONSET. MAY REPEAT DOSE IN TWO HOURS IF NO IMPROVEMENT. DO NOT EXCEED MORE THAN TWO TABLETS IN 24 HOURS. 02/23/22     valACYclovir (VALTREX) 500 MG tablet Take 1 tablet (500 mg total) by mouth 2 (two) times daily for 5 days 07/01/23   Windell Moment, MD  vortioxetine HBr (TRINTELLIX) 20 MG TABS tablet Take 1 tablet (20 mg total) by mouth daily. 06/17/23   Cleotis Nipper, MD      Allergies    Codeine, Morphine, and Morphine and codeine    Review of Systems   Review of Systems  Neurological:  Positive for seizures.    Physical Exam Updated Vital Signs BP 109/69   Pulse 72   Temp (!) 97.4 F (36.3 C) (Axillary)   Resp (!) 22   Ht 5\' 4"  (1.626 m)   Wt 103.9 kg   SpO2 98%   BMI 39.31 kg/m  Physical Exam Constitutional:      General: She is not in acute distress.    Appearance: She is not ill-appearing.     Comments: Responsive to verbal stimuli  HENT:     Head: Normocephalic and atraumatic.     Mouth/Throat:     Mouth: Mucous membranes are moist.     Pharynx: Oropharynx is clear.  Eyes:     Extraocular Movements: Extraocular movements intact.     Pupils: Pupils are equal, round, and reactive to light.  Cardiovascular:     Rate and Rhythm: Normal rate and  regular rhythm.     Heart sounds: Normal heart sounds. No murmur heard.    No friction rub. No gallop.  Pulmonary:     Breath sounds: Normal breath sounds. No stridor. No wheezing, rhonchi or rales.  Abdominal:     Palpations: Abdomen is soft.     Tenderness: There is no abdominal tenderness. There is no guarding or rebound.  Musculoskeletal:     Cervical back: Normal range of motion and neck supple.     Right lower leg: No edema.  Left lower leg: No edema.  Skin:    General: Skin is warm and dry.     Capillary Refill: Capillary refill takes less than 2 seconds.  Neurological:     General: No focal deficit present.     Comments: Moving all 4 extremities equally     ED Results / Procedures / Treatments   Labs (all labs ordered are listed, but only abnormal results are displayed) Labs Reviewed  BASIC METABOLIC PANEL - Abnormal; Notable for the following components:      Result Value   Creatinine, Ser 1.20 (*)    GFR, Estimated 55 (*)    All other components within normal limits  CBC WITH DIFFERENTIAL/PLATELET  CBG MONITORING, ED    EKG EKG Interpretation Date/Time:  Thursday July 11 2023 13:32:57 EST Ventricular Rate:  81 PR Interval:  145 QRS Duration:  81 QT Interval:  371 QTC Calculation: 431 R Axis:   11  Text Interpretation: Sinus rhythm Probable left atrial enlargement Abnormal R-wave progression, early transition Left ventricular hypertrophy Borderline ST elevation, lateral leads No significant change since last tracing Confirmed by Gwyneth Sprout ([id number]) on 07/11/2023 1:49:04 PM  Radiology DG Chest Portable 1 View Result Date: 07/11/2023 CLINICAL DATA:  Syncope EXAM: PORTABLE CHEST 1 VIEW COMPARISON:  02/20/2023 FINDINGS: Cardiomegaly. Low lung volumes. No focal airspace consolidation, pleural effusion, or pneumothorax. No displaced rib fracture on single frontal view. IMPRESSION: Cardiomegaly. No acute cardiopulmonary findings. Electronically Signed    By: Duanne Guess D.O.   On: 07/11/2023 15:14    Procedures Procedures    Medications Ordered in ED Medications  sodium chloride 0.9 % bolus 1,000 mL (has no administration in time range)    ED Course/ Medical Decision Making/ A&P Clinical Course as of 07/11/23 1619  Thu Jul 11, 2023  1533 Had a seizure at work CPR was performed during episode Hx of non-epileptic seizures, not on medication [JR]    Clinical Course User Index [JR] Rolla Flatten, MD                                 Medical Decision Making Amount and/or Complexity of Data Reviewed Labs: ordered. Radiology: ordered.   Vital signs stable, physical exam initially with a patient who is unresponsive, however during the time I was in the room, the patient awoke and was able to state her name.  She was somnolent and seemed sedated given her recent Versed administration.  Fingerstick blood sugar 87.  CBC with no leukocytosis or anemia.  Metabolic panel with no gross metabolic or electrolyte abnormality.  Chest x-ray with no rib fractures.  Upon reassessment, patient is more alert, however appears confused.  Husband was at bedside and reported that the patient has had episodes like this in the past, but it has been a little while since the last 1.  He reports that she does seem to be confused after the episodes.  Reports that he she does seem confused to him currently.  During this conversation, the patient is talking about "babies under the Christmas tree" and yelling for help when her paramedic is adjusting her blood pressure cuff.  Patient was verbally redirectable during this time.  I discussed the patient with neurology, who recommended observation for several more hours in the emergency department to evaluate her mental status.  She is signed out to the oncoming physician in stable condition, pending observation.  Final Clinical Impression(s) / ED Diagnoses Final diagnoses:  None    Rx / DC Orders ED  Discharge Orders     None         Janyth Pupa, MD 07/11/23 9629    Gwyneth Sprout, MD 07/11/23 2108

## 2023-07-21 ENCOUNTER — Encounter (HOSPITAL_COMMUNITY): Payer: Self-pay

## 2023-07-21 DIAGNOSIS — F331 Major depressive disorder, recurrent, moderate: Secondary | ICD-10-CM

## 2023-07-21 DIAGNOSIS — F4310 Post-traumatic stress disorder, unspecified: Secondary | ICD-10-CM

## 2023-07-21 DIAGNOSIS — F419 Anxiety disorder, unspecified: Secondary | ICD-10-CM

## 2023-07-22 ENCOUNTER — Other Ambulatory Visit (HOSPITAL_COMMUNITY): Payer: Self-pay

## 2023-07-22 MED ORDER — VORTIOXETINE HBR 20 MG PO TABS
20.0000 mg | ORAL_TABLET | Freq: Every day | ORAL | 0 refills | Status: DC
  Filled 2023-07-22: qty 90, 90d supply, fill #0

## 2023-07-22 NOTE — Telephone Encounter (Signed)
Will send a new refill of Trintellix.

## 2023-07-22 NOTE — Telephone Encounter (Signed)
Done

## 2023-07-25 ENCOUNTER — Other Ambulatory Visit: Payer: Self-pay

## 2023-07-25 ENCOUNTER — Other Ambulatory Visit (HOSPITAL_COMMUNITY): Payer: Self-pay

## 2023-07-25 MED ORDER — VEOZAH 45 MG PO TABS
1.0000 | ORAL_TABLET | Freq: Every day | ORAL | 0 refills | Status: DC
  Filled 2023-07-25: qty 90, 90d supply, fill #0

## 2023-07-26 ENCOUNTER — Other Ambulatory Visit (HOSPITAL_COMMUNITY): Payer: Self-pay

## 2023-07-29 ENCOUNTER — Other Ambulatory Visit (HOSPITAL_COMMUNITY): Payer: Self-pay

## 2023-08-08 ENCOUNTER — Ambulatory Visit: Payer: 59 | Admitting: Physical Medicine & Rehabilitation

## 2023-08-08 ENCOUNTER — Encounter: Payer: 59 | Attending: Physical Medicine & Rehabilitation | Admitting: Physical Medicine & Rehabilitation

## 2023-08-08 ENCOUNTER — Other Ambulatory Visit (HOSPITAL_COMMUNITY): Payer: Self-pay

## 2023-08-08 ENCOUNTER — Encounter: Payer: Self-pay | Admitting: Physical Medicine & Rehabilitation

## 2023-08-08 VITALS — BP 132/82 | HR 89 | Ht 64.0 in | Wt 238.0 lb

## 2023-08-08 DIAGNOSIS — M545 Low back pain, unspecified: Secondary | ICD-10-CM | POA: Insufficient documentation

## 2023-08-08 DIAGNOSIS — M47817 Spondylosis without myelopathy or radiculopathy, lumbosacral region: Secondary | ICD-10-CM | POA: Insufficient documentation

## 2023-08-08 DIAGNOSIS — G8929 Other chronic pain: Secondary | ICD-10-CM | POA: Insufficient documentation

## 2023-08-08 MED ORDER — HYDROCODONE-ACETAMINOPHEN 5-325 MG PO TABS
1.0000 | ORAL_TABLET | Freq: Four times a day (QID) | ORAL | 0 refills | Status: AC | PRN
  Filled 2023-08-08: qty 30, 8d supply, fill #0

## 2023-08-08 MED ORDER — DIAZEPAM 10 MG PO TABS
10.0000 mg | ORAL_TABLET | Freq: Four times a day (QID) | ORAL | 0 refills | Status: DC | PRN
Start: 2023-08-08 — End: 2023-09-26
  Filled 2023-08-08: qty 1, 1d supply, fill #0
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
## 2023-08-09 ENCOUNTER — Other Ambulatory Visit (HOSPITAL_COMMUNITY): Payer: Self-pay

## 2023-08-13 ENCOUNTER — Other Ambulatory Visit (HOSPITAL_COMMUNITY): Payer: Self-pay | Admitting: Psychiatry

## 2023-08-13 DIAGNOSIS — F4310 Post-traumatic stress disorder, unspecified: Secondary | ICD-10-CM

## 2023-08-13 DIAGNOSIS — F419 Anxiety disorder, unspecified: Secondary | ICD-10-CM

## 2023-08-14 ENCOUNTER — Other Ambulatory Visit: Payer: Self-pay

## 2023-08-15 ENCOUNTER — Other Ambulatory Visit (HOSPITAL_COMMUNITY): Payer: Self-pay

## 2023-08-20 ENCOUNTER — Other Ambulatory Visit (HOSPITAL_COMMUNITY): Payer: Self-pay

## 2023-08-20 MED ORDER — SEMAGLUTIDE-WEIGHT MANAGEMENT 1 MG/0.5ML ~~LOC~~ SOAJ
1.0000 mg | SUBCUTANEOUS | 0 refills | Status: DC
  Filled 2023-08-20: qty 2, 28d supply, fill #0

## 2023-08-20 MED ORDER — SEMAGLUTIDE-WEIGHT MANAGEMENT 0.5 MG/0.5ML ~~LOC~~ SOAJ
0.5000 mg | SUBCUTANEOUS | 0 refills | Status: DC
  Filled 2023-08-20: qty 2, 28d supply, fill #0

## 2023-08-20 MED ORDER — SEMAGLUTIDE-WEIGHT MANAGEMENT 2.4 MG/0.75ML ~~LOC~~ SOAJ
2.4000 mg | SUBCUTANEOUS | 0 refills | Status: DC
  Filled 2023-08-20: qty 3, 28d supply, fill #0

## 2023-08-20 MED ORDER — SEMAGLUTIDE-WEIGHT MANAGEMENT 0.25 MG/0.5ML ~~LOC~~ SOAJ
0.2500 mg | SUBCUTANEOUS | 0 refills | Status: AC
  Filled 2023-08-20 – 2023-09-12 (×2): qty 2, 28d supply, fill #0

## 2023-08-20 MED ORDER — SEMAGLUTIDE-WEIGHT MANAGEMENT 1.7 MG/0.75ML ~~LOC~~ SOAJ
1.7000 mg | SUBCUTANEOUS | 0 refills | Status: DC
  Filled 2023-08-20: qty 3, 28d supply, fill #0

## 2023-08-21 ENCOUNTER — Encounter (HOSPITAL_COMMUNITY): Payer: Self-pay

## 2023-08-21 ENCOUNTER — Encounter: Payer: Self-pay | Admitting: Physical Medicine & Rehabilitation

## 2023-08-22 ENCOUNTER — Encounter (HOSPITAL_COMMUNITY): Payer: Self-pay

## 2023-08-22 ENCOUNTER — Other Ambulatory Visit (HOSPITAL_COMMUNITY): Payer: Self-pay

## 2023-08-22 DIAGNOSIS — F4310 Post-traumatic stress disorder, unspecified: Secondary | ICD-10-CM

## 2023-08-22 DIAGNOSIS — F419 Anxiety disorder, unspecified: Secondary | ICD-10-CM

## 2023-08-22 MED ORDER — ALPRAZOLAM 0.5 MG PO TABS
0.5000 mg | ORAL_TABLET | Freq: Every evening | ORAL | 0 refills | Status: DC | PRN
  Filled 2023-08-22: qty 30, 30d supply, fill #0

## 2023-08-22 MED ORDER — ALPRAZOLAM 0.5 MG PO TABS: 0.5000 mg | ORAL_TABLET | Freq: Every evening | ORAL | 0 refills | Status: DC | PRN

## 2023-08-22 MED ORDER — HYDROCODONE-ACETAMINOPHEN 5-325 MG PO TABS
1.0000 | ORAL_TABLET | Freq: Three times a day (TID) | ORAL | 0 refills | Status: DC | PRN
  Filled 2023-08-22: qty 30, 10d supply, fill #0

## 2023-08-27 DIAGNOSIS — M1711 Unilateral primary osteoarthritis, right knee: Secondary | ICD-10-CM | POA: Diagnosis not present

## 2023-08-27 DIAGNOSIS — Z9889 Other specified postprocedural states: Secondary | ICD-10-CM | POA: Diagnosis not present

## 2023-08-27 DIAGNOSIS — Z4889 Encounter for other specified surgical aftercare: Secondary | ICD-10-CM | POA: Diagnosis not present

## 2023-08-28 DIAGNOSIS — R0981 Nasal congestion: Secondary | ICD-10-CM | POA: Diagnosis not present

## 2023-08-28 DIAGNOSIS — R059 Cough, unspecified: Secondary | ICD-10-CM | POA: Diagnosis not present

## 2023-08-28 DIAGNOSIS — R070 Pain in throat: Secondary | ICD-10-CM | POA: Diagnosis not present

## 2023-08-30 ENCOUNTER — Other Ambulatory Visit (HOSPITAL_COMMUNITY): Payer: Self-pay

## 2023-09-02 ENCOUNTER — Telehealth: Payer: Self-pay

## 2023-09-02 NOTE — Telephone Encounter (Signed)
Forms have been placed in Dr. Geryl Rankins box in [address].

## 2023-09-06 ENCOUNTER — Encounter (HOSPITAL_COMMUNITY): Payer: Self-pay

## 2023-09-09 ENCOUNTER — Other Ambulatory Visit (HOSPITAL_COMMUNITY): Payer: Self-pay

## 2023-09-11 ENCOUNTER — Other Ambulatory Visit (HOSPITAL_COMMUNITY): Payer: Self-pay

## 2023-09-12 ENCOUNTER — Encounter: Payer: Self-pay | Admitting: Physical Medicine & Rehabilitation

## 2023-09-12 ENCOUNTER — Encounter: Payer: 59 | Attending: Physical Medicine & Rehabilitation | Admitting: Physical Medicine & Rehabilitation

## 2023-09-12 ENCOUNTER — Other Ambulatory Visit (HOSPITAL_COMMUNITY): Payer: Self-pay

## 2023-09-12 VITALS — BP 113/77 | HR 87 | Temp 98.0°F | Ht 64.0 in | Wt 239.0 lb

## 2023-09-12 DIAGNOSIS — M1711 Unilateral primary osteoarthritis, right knee: Secondary | ICD-10-CM | POA: Diagnosis present

## 2023-09-12 DIAGNOSIS — Z79891 Long term (current) use of opiate analgesic: Secondary | ICD-10-CM | POA: Insufficient documentation

## 2023-09-12 DIAGNOSIS — M545 Low back pain, unspecified: Secondary | ICD-10-CM | POA: Insufficient documentation

## 2023-09-12 DIAGNOSIS — G894 Chronic pain syndrome: Secondary | ICD-10-CM | POA: Diagnosis present

## 2023-09-12 DIAGNOSIS — M7062 Trochanteric bursitis, left hip: Secondary | ICD-10-CM | POA: Diagnosis present

## 2023-09-12 DIAGNOSIS — M47817 Spondylosis without myelopathy or radiculopathy, lumbosacral region: Secondary | ICD-10-CM | POA: Diagnosis not present

## 2023-09-12 DIAGNOSIS — M7061 Trochanteric bursitis, right hip: Secondary | ICD-10-CM | POA: Insufficient documentation

## 2023-09-12 DIAGNOSIS — Z5181 Encounter for therapeutic drug level monitoring: Secondary | ICD-10-CM | POA: Insufficient documentation

## 2023-09-12 DIAGNOSIS — G8929 Other chronic pain: Secondary | ICD-10-CM | POA: Diagnosis present

## 2023-09-12 DIAGNOSIS — Z8673 Personal history of transient ischemic attack (TIA), and cerebral infarction without residual deficits: Secondary | ICD-10-CM

## 2023-09-12 MED ORDER — HYDROCODONE-ACETAMINOPHEN 5-325 MG PO TABS
1.0000 | ORAL_TABLET | Freq: Three times a day (TID) | ORAL | 0 refills | Status: DC | PRN
  Filled 2023-09-19: qty 30, 10d supply, fill #0

## 2023-09-12 MED ORDER — LIDOCAINE HCL (PF) 2 % IJ SOLN
4.0000 mL | Freq: Once | INTRAMUSCULAR | Status: AC
Start: 2023-09-12 — End: 2023-09-12
  Administered 2023-09-12: 4 mL

## 2023-09-12 MED ORDER — IOHEXOL 180 MG/ML  SOLN
3.0000 mL | Freq: Once | INTRAMUSCULAR | Status: AC
  Administered 2023-09-12: 3 mL via INTRAVENOUS

## 2023-09-12 MED ORDER — LIDOCAINE HCL 1 % IJ SOLN
10.0000 mL | Freq: Once | INTRAMUSCULAR | Status: AC
  Administered 2023-09-12: 10 mL

## 2023-09-12 NOTE — Progress Notes (Signed)
Bilateral Lumbar L3, L4  medial branch blocks and L 5 dorsal ramus injection under fluoroscopic guidance  Indication: Lumbar pain which is not relieved by medication management or other conservative care and interfering with self-care and mobility.  Informed consent was obtained after describing risks and benefits of the procedure with the patient, this includes bleeding, infection, paralysis and medication side effects.  The patient wishes to proceed and has given written consent.  The patient was placed in prone position.  The lumbar area was marked and prepped with Betadine.  One mL of 1% lidocaine was injected into each of 6 areas into the skin and subcutaneous tissue.  Then a 22-gauge 5" spinal needle was inserted targeting the junction of the left S1 superior articular process and sacral ala junction. Needle was advanced under fluoroscopic guidance.  Bone contact was made.Omnipaque 180 was injected x 0.5 mL demonstrating no intravascular uptake.  Then a solution  of 2% MPF lidocaine was injected x 0.5 mL.  Then the left L5 superior articular process in transverse process junction was targeted.  Bone contact was made. Omnipaque 180 was injected x 0.5 mL demonstrating no intravascular uptake. Then a solution containing  2% MPF lidocaine was injected x 0.5 mL.  Then the left L4 superior articular process in transverse process junction was targeted.  Bone contact was made. Omnipaque 180 was injected x 0.5 mL demonstrating no intravascular uptake.  Then a solution containing2% MPF lidocaine was injected x 0.5 mL.  This same procedure was performed on the right side using the same needle, technique and injectate.  Patient tolerated procedure well.  Post procedure instructions were given.   Lidocaine 1% with preservative multidose, 10ml no waste Lidocaine 2% MPF, 5ml bottle, 3ml used 2ml waste Omnipaque 180 1.5 ml used, 8.5 ml waste  Pt to call regarding pain relief Could not tell whether the injection  helped when she left the procedure room

## 2023-09-12 NOTE — Progress Notes (Signed)
  PROCEDURE RECORD [facility]   Name: Desiree Russell DOB:[birth year] MRN: [id number]  Date:09/12/2023  Physician: Claudette Laws, MD    Nurse/CMA: Clarabel Marion RMA  Allergies:  Allergies  Allergen Reactions   Codeine Hives, Rash and Other (See Comments)    Other reaction(s): Other (see comments), Hives, Hives , Hives, Hives , , Hives , Hives , Hives , Hives, Hives , Other reaction(s): Other (see comments), Hives, Other reaction(s): Other (see comments), Hives   Morphine Hives and Other (See Comments)    Headaches, also   Morphine And Codeine Hives    Consent Signed: Yes.    Is patient diabetic? No.  CBG today? .  Pregnant: No. LMP: No LMP recorded. Patient has had a hysterectomy. (age 56-55)  Anticoagulants: no Anti-inflammatory: no Antibiotics: no  Procedure: Bilateral L3-4-5 Medial Branch Block  Position: Prone Start Time: 1:48 PM  End Time: 2:02PM  Fluoro Time: 1:23  RN/CMA Shirl Weir RMA Pritesh Sobecki RMA    Time 1:02 PM 2:06 PM    BP 113/77 123/82    Pulse 87 80    Respirations 16 16    O2 Sat 95 96    S/S 6 6    Pain Level 8/10      D/C home with Husband, patient A & O X 3, D/C instructions reviewed, and sits independently.

## 2023-09-13 ENCOUNTER — Other Ambulatory Visit (HOSPITAL_COMMUNITY): Payer: Self-pay

## 2023-09-13 ENCOUNTER — Other Ambulatory Visit: Payer: Self-pay

## 2023-09-13 MED ORDER — PREGABALIN 100 MG PO CAPS
100.0000 mg | ORAL_CAPSULE | Freq: Two times a day (BID) | ORAL | 2 refills | Status: DC
Start: 2023-09-13 — End: 2023-12-12
  Filled 2023-09-13: qty 60, 30d supply, fill #0
  Filled 2023-10-24: qty 60, 30d supply, fill #1

## 2023-09-16 ENCOUNTER — Other Ambulatory Visit (HOSPITAL_COMMUNITY): Payer: Self-pay

## 2023-09-16 ENCOUNTER — Other Ambulatory Visit: Payer: Self-pay

## 2023-09-16 ENCOUNTER — Telehealth (HOSPITAL_BASED_OUTPATIENT_CLINIC_OR_DEPARTMENT_OTHER): Payer: 59 | Admitting: Psychiatry

## 2023-09-16 ENCOUNTER — Encounter (HOSPITAL_COMMUNITY): Payer: Self-pay | Admitting: Psychiatry

## 2023-09-16 VITALS — Wt 239.0 lb

## 2023-09-16 DIAGNOSIS — R61 Generalized hyperhidrosis: Secondary | ICD-10-CM

## 2023-09-16 DIAGNOSIS — F419 Anxiety disorder, unspecified: Secondary | ICD-10-CM | POA: Diagnosis not present

## 2023-09-16 DIAGNOSIS — F4310 Post-traumatic stress disorder, unspecified: Secondary | ICD-10-CM

## 2023-09-16 DIAGNOSIS — F331 Major depressive disorder, recurrent, moderate: Secondary | ICD-10-CM

## 2023-09-16 MED ORDER — DRYSOL 20 % EX SOLN
Freq: Every day | CUTANEOUS | 0 refills | Status: DC
Start: 2023-09-16 — End: 2023-12-12
  Filled 2023-09-16: qty 35, 20d supply, fill #0

## 2023-09-16 MED ORDER — VORTIOXETINE HBR 20 MG PO TABS
20.0000 mg | ORAL_TABLET | Freq: Every day | ORAL | 0 refills | Status: DC
  Filled 2023-09-16 – 2023-10-24 (×2): qty 90, 90d supply, fill #0

## 2023-09-16 MED ORDER — OLANZAPINE 5 MG PO TABS
5.0000 mg | ORAL_TABLET | Freq: Every evening | ORAL | 0 refills | Status: DC
  Filled 2023-09-16: qty 90, 90d supply, fill #0

## 2023-09-16 MED ORDER — FLUOXETINE HCL 20 MG PO CAPS
20.0000 mg | ORAL_CAPSULE | Freq: Every evening | ORAL | 0 refills | Status: DC
Start: 2023-09-16 — End: 2023-12-16
  Filled 2023-09-16: qty 90, 90d supply, fill #0
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
## 2023-09-17 ENCOUNTER — Other Ambulatory Visit (HOSPITAL_COMMUNITY): Payer: Self-pay

## 2023-09-17 ENCOUNTER — Encounter: Payer: Self-pay | Admitting: Physical Medicine & Rehabilitation

## 2023-09-19 ENCOUNTER — Other Ambulatory Visit (HOSPITAL_COMMUNITY): Payer: Self-pay

## 2023-09-20 ENCOUNTER — Other Ambulatory Visit (HOSPITAL_COMMUNITY): Payer: Self-pay

## 2023-09-21 ENCOUNTER — Other Ambulatory Visit (HOSPITAL_COMMUNITY): Payer: Self-pay

## 2023-09-25 ENCOUNTER — Other Ambulatory Visit: Payer: Self-pay | Admitting: Gastroenterology

## 2023-09-25 DIAGNOSIS — K2100 Gastro-esophageal reflux disease with esophagitis, without bleeding: Secondary | ICD-10-CM

## 2023-09-25 NOTE — Progress Notes (Unsigned)
 Subjective:    Patient ID: Desiree Russell, female    DOB: 1970/10/24, 53 y.o.   MRN: [id number]  HPI: Desiree Russell is a 53 y.o. female who returns for follow up appointment for chronic pain and medication refill.She states her pain is located in her lower back, bilateral hips and right knee pain. She rates her pain 7. She was encouraged to increase her HEP as Tolerated, she verbalizes understanding. We discussed chair exercises as well, her  current exercise regime is walking.  Desiree Russell Morphine equivalent is 15.00 MME. She  is also prescribed Alprazolam by Dr. Lolly Mustache .We have discussed the black box warning of using opioids and benzodiazepines. I highlighted the dangers of using these drugs together and discussed the adverse events including respiratory suppression, overdose, cognitive impairment and importance of compliance with current regimen. We will continue to monitor and adjust as indicated.  she is being closely monitored and under the care of her psychiatrist.    UDS ordered today.      Pain Inventory Average Pain 4 Pain Right Now 7 My pain is constant, burning, and aching  In the last 24 hours, has pain interfered with the following? General activity 7 Relation with others 7 Enjoyment of life 7 What TIME of day is your pain at its worst? morning , daytime, evening, and night Sleep (in general) Poor  Pain is worse with: walking, bending, standing, and some activites Pain improves with: rest, heat/ice, and medication Relief from Meds: 3  Family History  Problem Relation Age of Onset   Hypertension Mother    Hypertension Father    Heart attack Father 41   Healthy Sister    Healthy Sister    Healthy Sister    Hypertension Brother    Diabetes type II Brother    COPD Brother    Healthy Brother    Healthy Brother    Breast cancer Maternal Aunt    Throat cancer Maternal Uncle    Hypertension Maternal Grandmother    Diabetes Paternal Surveyor, minerals    Healthy Daughter     Healthy Son    Healthy Son    Healthy Son    Colon cancer Neg Hx    Pancreatic cancer Neg Hx    Stomach cancer Neg Hx    Liver disease Neg Hx    Esophageal cancer Neg Hx    Rectal cancer Neg Hx    Social History   Socioeconomic History   Marital status: Married    Spouse name: Not on file   Number of children: 4   Years of education: Not on file   Highest education level: Bachelor's degree (e.g., BA, AB, BS)  Occupational History   Not on file  Tobacco Use   Smoking status: Never   Smokeless tobacco: Never  Vaping Use   Vaping status: Never Used  Substance and Sexual Activity   Alcohol use: Yes    Alcohol/week: 1.0 standard drink of alcohol    Types: 1 Glasses of wine per week    Comment: occ    Drug use: No   Sexual activity: Yes    Partners: Male    Birth control/protection: Surgical  Other Topics Concern   Not on file  Social History Narrative   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     Social Drivers of Health   Financial Resource Strain: Low Risk  (03/07/2023)   Overall Financial Resource Strain (CARDIA)    Difficulty of  Paying Living Expenses: Not hard at all  Food Insecurity: No Food Insecurity (03/07/2023)   Hunger Vital Sign    Worried About Running Out of Food in the Last Year: Never true    Ran Out of Food in the Last Year: Never true  Transportation Needs: No Transportation Needs (03/07/2023)   PRAPARE - Administrator, Civil Service (Medical): No    Lack of Transportation (Non-Medical): No  Physical Activity: Unknown (03/07/2023)   Exercise Vital Sign    Days of Exercise per Week: Patient declined    Minutes of Exercise per Session: Not on file  Stress: Stress Concern Present (03/07/2023)   [name] of Occupational Health - Occupational Stress Questionnaire    Feeling of Stress : To some extent  Social Connections: Moderately Integrated (03/07/2023)   Social Connection and Isolation Panel [NHANES]    Frequency  of Communication with Friends and Family: More than three times a week    Frequency of Social Gatherings with Friends and Family: Once a week    Attends Religious Services: More than 4 times per year    Active Member of Clubs or Organizations: No    Attends Engineer, structural: Not on file    Marital Status: Married   Past Surgical History:  Procedure Laterality Date   ABDOMINAL HYSTERECTOMY     CHOLECYSTECTOMY     CHOLECYSTECTOMY, LAPAROSCOPIC     COLONOSCOPY     KNEE ARTHROSCOPY Right 01/02/2023   UPPER GASTROINTESTINAL ENDOSCOPY     Past Surgical History:  Procedure Laterality Date   ABDOMINAL HYSTERECTOMY     CHOLECYSTECTOMY     CHOLECYSTECTOMY, LAPAROSCOPIC     COLONOSCOPY     KNEE ARTHROSCOPY Right 01/02/2023   UPPER GASTROINTESTINAL ENDOSCOPY     Past Medical History:  Diagnosis Date   Allergy    Anemia    Anxiety    Arthritis    Dyslipidemia    Fibromyalgia    GERD (gastroesophageal reflux disease)    Headache    Migraines    Pericarditis    Stroke (HCC) 12/2020   Tachycardia    TIA (transient ischemic attack)    Recurrent   BP 139/85   Pulse 89   Ht 5\' 4"  (1.626 m)   SpO2 95%   BMI 41.02 kg/m   Opioid Risk Score:   Fall Risk Score:  `1  Depression screen [facility] 2/9     09/26/2023    3:38 PM 09/12/2023   12:57 PM 08/08/2023    3:42 PM 03/19/2023    2:08 PM 03/11/2023    9:04 AM 08/16/2022    7:47 AM 05/11/2022    7:42 AM  Depression screen PHQ 2/9  Decreased Interest 0 0 0 0 0 0 0  Down, Depressed, Hopeless 0 0 0 0 0 0 0  PHQ - 2 Score 0 0 0 0 0 0 0  Altered sleeping    3 1 0 0  Tired, decreased energy    1 3 0 0  Change in appetite    2 3 0 0  Feeling bad or failure about yourself     0 0 0 0  Trouble concentrating    1 1 0 0  Moving slowly or fidgety/restless    0 0 0 0  Suicidal thoughts    0 0 0 0  PHQ-9 Score    7 8 0 0  Difficult doing work/chores    Not difficult at all Not  difficult at all Not difficult at all Not difficult at  all    Review of Systems  Musculoskeletal:  Positive for back pain.  All other systems reviewed and are negative.      Objective:   Physical Exam Vitals and nursing note reviewed.  Constitutional:      Appearance: Normal appearance. She is obese.  Cardiovascular:     Rate and Rhythm: Normal rate and regular rhythm.     Pulses: Normal pulses.     Heart sounds: Normal heart sounds.  Pulmonary:     Effort: Pulmonary effort is normal.     Breath sounds: Normal breath sounds.  Musculoskeletal:     Comments: Normal Muscle Bulk and Muscle Testing Reveals:  Upper Extremities:Full  ROM and Muscle Strength 5/5 Bilateral AC Joint Tenderness Lumbar Paraspinal Tenderness: L-3-L-5 Bilateral Greater Trochanter Tenderness Lower Extremities: Right: Decreased ROM and Muscle Strength 5/5 Right Lower Extremity Flexion Produces Pain into her Right Patella Left Lower Extremity: Full ROM and Muscle Strength 5/5 Arises from Table slowly Narrow Base d Gait     Skin:    General: Skin is warm and dry.  Neurological:     Mental Status: She is alert and oriented to person, place, and time.  Psychiatric:        Mood and Affect: Mood normal.        Behavior: Behavior normal.         Assessment & Plan:  Lumbo Sacral Spondylosis: Chronic Bilateral Low Back Pain: Continue HEP as Tolerated. Continue to Monitor. S/P On 09/12/2023, with Dr Wynn Banker.  Bilateral Lumbar L3, L4 medial branch blocks and L 5 dorsal ramus injection under fluoroscopic guidance . Good relief noted.  2. Bilateral Greater Trochanter Bursitis: Continue HEP as Tolerated. Continue to Monitor.  3. Primary Osteoarthritis Right Knee: ortho Following. Continue to Monitor.  4. Chronic Pain Syndrome: Refilled: Hydrocodone 5/325 mg one tablet every 8 hours as needed for pain #90. We will continue the opioid monitoring program, this consists of regular clinic visits, examinations, urine drug screen, pill counts as well as use of [place] Controlled Substance Reporting system. A 12 month History has been reviewed on the [place] Controlled Substance Reporting System on 09/26/2023.   F/U in 1 month

## 2023-09-26 ENCOUNTER — Encounter (HOSPITAL_COMMUNITY): Payer: Self-pay

## 2023-09-26 ENCOUNTER — Encounter: Payer: Self-pay | Admitting: Registered Nurse

## 2023-09-26 ENCOUNTER — Other Ambulatory Visit (HOSPITAL_COMMUNITY): Payer: Self-pay

## 2023-09-26 ENCOUNTER — Encounter (HOSPITAL_BASED_OUTPATIENT_CLINIC_OR_DEPARTMENT_OTHER): Payer: 59 | Admitting: Registered Nurse

## 2023-09-26 VITALS — BP 139/85 | HR 89 | Ht 64.0 in

## 2023-09-26 DIAGNOSIS — M7062 Trochanteric bursitis, left hip: Secondary | ICD-10-CM

## 2023-09-26 DIAGNOSIS — M7061 Trochanteric bursitis, right hip: Secondary | ICD-10-CM

## 2023-09-26 DIAGNOSIS — G894 Chronic pain syndrome: Secondary | ICD-10-CM

## 2023-09-26 DIAGNOSIS — G8929 Other chronic pain: Secondary | ICD-10-CM

## 2023-09-26 DIAGNOSIS — M1711 Unilateral primary osteoarthritis, right knee: Secondary | ICD-10-CM

## 2023-09-26 DIAGNOSIS — Z79891 Long term (current) use of opiate analgesic: Secondary | ICD-10-CM | POA: Diagnosis not present

## 2023-09-26 DIAGNOSIS — M47817 Spondylosis without myelopathy or radiculopathy, lumbosacral region: Secondary | ICD-10-CM

## 2023-09-26 DIAGNOSIS — Z5181 Encounter for therapeutic drug level monitoring: Secondary | ICD-10-CM | POA: Diagnosis not present

## 2023-09-26 DIAGNOSIS — M545 Low back pain, unspecified: Secondary | ICD-10-CM

## 2023-09-26 MED ORDER — PANTOPRAZOLE SODIUM 40 MG PO TBEC
40.0000 mg | DELAYED_RELEASE_TABLET | Freq: Every day | ORAL | 1 refills | Status: DC
  Filled 2023-09-26: qty 90, 90d supply, fill #0
  Filled 2023-12-30: qty 90, 90d supply, fill #1

## 2023-09-26 MED ORDER — HYDROCODONE-ACETAMINOPHEN 5-325 MG PO TABS
1.0000 | ORAL_TABLET | Freq: Three times a day (TID) | ORAL | 0 refills | Status: DC | PRN
  Filled 2023-09-26 – 2023-09-27 (×2): qty 90, 30d supply, fill #0

## 2023-09-26 NOTE — Patient Instructions (Signed)
 Send a My- Chart message in a week: With update on Medication change.

## 2023-09-27 ENCOUNTER — Other Ambulatory Visit (HOSPITAL_COMMUNITY): Payer: Self-pay

## 2023-09-27 ENCOUNTER — Other Ambulatory Visit: Payer: Self-pay

## 2023-09-29 LAB — TOXASSURE SELECT,+ANTIDEPR,UR

## 2023-10-04 ENCOUNTER — Telehealth: Payer: Self-pay | Admitting: Registered Nurse

## 2023-10-04 ENCOUNTER — Other Ambulatory Visit (HOSPITAL_COMMUNITY): Payer: Self-pay

## 2023-10-04 NOTE — Telephone Encounter (Signed)
 PMP was Reviewed.  UDS was Reviewed.  My-Chart sent to Ms. Montag regarding her Hydrocodone.

## 2023-10-04 NOTE — Telephone Encounter (Signed)
 UDS results was Reviewed.  Ms. Stockinger was called regarding Temazepam/ Oxazepam results. Diazepam was filled on 08/08/2023.  This provider called [facility], reviewed her medication protocol, call was placed to Ms. Raul Del . Ms. Mcnay reports the only Diazepam she had  was for pre-procedure her procedure was 09/12/2023. UDS was on 09/26/2023.

## 2023-10-16 NOTE — Progress Notes (Signed)
 Subjective:    Patient ID: Desiree Russell, female    DOB: [birth year], 53 y.o.   MRN: [id number]  HPI: Desiree Russell is a 53 y.o. female who returns for follow up appointment for chronic pain and medication refill. She states her pain is located in her lower back, right hip and right knee. Also reports Ortho following her right knee pain.She  rates her pain 9. Her current exercise regime is walking short distances, and performing chair stretching exercises.  Desiree Russell Morphine equivalent is 15.00 MME.   Last UDS was Performed on 09/26/2023, it was consistent.     Pain Inventory Average Pain 7 Pain Right Now 9 My pain is constant, sharp, stabbing, aching, and throbbing  In the last 24 hours, has pain interfered with the following? General activity 8 Relation with others 8 Enjoyment of life 10 What TIME of day is your pain at its worst? morning , daytime, evening, and night Sleep (in general) Fair  Pain is worse with: walking, bending, sitting, and standing Pain improves with:  nothing  Relief from Meds: 0  Family History  Problem Relation Age of Onset   Hypertension Mother    Hypertension Father    Heart attack Father 38   Healthy Sister    Healthy Sister    Healthy Sister    Hypertension Brother    Diabetes type II Brother    COPD Brother    Healthy Brother    Healthy Brother    Breast cancer Maternal Aunt    Throat cancer Maternal Uncle    Hypertension Maternal Grandmother    Diabetes Paternal Surveyor, minerals    Healthy Daughter    Healthy Son    Healthy Son    Healthy Son    Colon cancer Neg Hx    Pancreatic cancer Neg Hx    Stomach cancer Neg Hx    Liver disease Neg Hx    Esophageal cancer Neg Hx    Rectal cancer Neg Hx    Social History   Socioeconomic History   Marital status: Married    Spouse name: Not on file   Number of children: 4   Years of education: Not on file   Highest education level: Bachelor's degree (e.g., BA, AB, BS)  Occupational History    Not on file  Tobacco Use   Smoking status: Never   Smokeless tobacco: Never  Vaping Use   Vaping status: Never Used  Substance and Sexual Activity   Alcohol use: Yes    Alcohol/week: 1.0 standard drink of alcohol    Types: 1 Glasses of wine per week    Comment: occ    Drug use: No   Sexual activity: Yes    Partners: Male    Birth control/protection: Surgical  Other Topics Concern   Not on file  Social History Narrative   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     Social Drivers of Corporate investment banker Strain: Low Risk  (03/07/2023)   Overall Financial Resource Strain (CARDIA)    Difficulty of Paying Living Expenses: Not hard at all  Food Insecurity: No Food Insecurity (03/07/2023)   Hunger Vital Sign    Worried About Running Out of Food in the Last Year: Never true    Ran Out of Food in the Last Year: Never true  Transportation Needs: No Transportation Needs (03/07/2023)   PRAPARE - Administrator, Civil Service (Medical): No  Lack of Transportation (Non-Medical): No  Physical Activity: Unknown (03/07/2023)   Exercise Vital Sign    Days of Exercise per Week: Patient declined    Minutes of Exercise per Session: Not on file  Stress: Stress Concern Present (03/07/2023)   [name] of Occupational Health - Occupational Stress Questionnaire    Feeling of Stress : To some extent  Social Connections: Moderately Integrated (03/07/2023)   Social Connection and Isolation Panel [NHANES]    Frequency of Communication with Friends and Family: More than three times a week    Frequency of Social Gatherings with Friends and Family: Once a week    Attends Religious Services: More than 4 times per year    Active Member of Clubs or Organizations: No    Attends Engineer, structural: Not on file    Marital Status: Married   Past Surgical History:  Procedure Laterality Date   ABDOMINAL HYSTERECTOMY     CHOLECYSTECTOMY     CHOLECYSTECTOMY,  LAPAROSCOPIC     COLONOSCOPY     KNEE ARTHROSCOPY Right 01/02/2023   UPPER GASTROINTESTINAL ENDOSCOPY     Past Surgical History:  Procedure Laterality Date   ABDOMINAL HYSTERECTOMY     CHOLECYSTECTOMY     CHOLECYSTECTOMY, LAPAROSCOPIC     COLONOSCOPY     KNEE ARTHROSCOPY Right 01/02/2023   UPPER GASTROINTESTINAL ENDOSCOPY     Past Medical History:  Diagnosis Date   Allergy    Anemia    Anxiety    Arthritis    Dyslipidemia    Fibromyalgia    GERD (gastroesophageal reflux disease)    Headache    Migraines    Pericarditis    Stroke (HCC) 12/2020   Tachycardia    TIA (transient ischemic attack)    Recurrent   BP (!) 145/92 (BP Location: Left Arm, Patient Position: Sitting, Cuff Size: Large)   Pulse 84   Resp 16   Ht 5\' 4"  (1.626 m)   Wt 247 lb 6.4 oz (112.2 kg)   SpO2 100%   BMI 42.47 kg/m   Opioid Risk Score:   Fall Risk Score:  `1  Depression screen [facility] 2/9     09/26/2023    3:38 PM 09/12/2023   12:57 PM 08/08/2023    3:42 PM 03/19/2023    2:08 PM 03/11/2023    9:04 AM 08/16/2022    7:47 AM 05/11/2022    7:42 AM  Depression screen PHQ 2/9  Decreased Interest 0 0 0 0 0 0 0  Down, Depressed, Hopeless 0 0 0 0 0 0 0  PHQ - 2 Score 0 0 0 0 0 0 0  Altered sleeping    3 1 0 0  Tired, decreased energy    1 3 0 0  Change in appetite    2 3 0 0  Feeling bad or failure about yourself     0 0 0 0  Trouble concentrating    1 1 0 0  Moving slowly or fidgety/restless    0 0 0 0  Suicidal thoughts    0 0 0 0  PHQ-9 Score    7 8 0 0  Difficult doing work/chores    Not difficult at all Not difficult at all Not difficult at all Not difficult at all    Review of Systems     Objective:   Physical Exam Vitals and nursing note reviewed.  Constitutional:      Appearance: Normal appearance.  Cardiovascular:  Rate and Rhythm: Normal rate and regular rhythm.     Pulses: Normal pulses.     Heart sounds: Normal heart sounds.  Pulmonary:     Effort: Pulmonary effort is  normal.     Breath sounds: Normal breath sounds.  Musculoskeletal:     Right lower leg: Edema present.     Comments: Normal Muscle Bulk and Muscle Testing Reveals:  Upper Extremities: Decreased ROM 90 Degrees and Muscle Strength 5/5  Lumbar Hypersensitivity Lower Extremities: Right: Decreased ROM and Muscle Strength 5/5 Right Lower Extremity Flexion Produces Pain into her Right Patella. Swelling Noted Let Lower Extremity: ull ROM and Muscle Strength 5/5 Arises from Table slowly Antalgic  Gait     Skin:    General: Skin is warm and dry.  Neurological:     Mental Status: She is alert and oriented to person, place, and time.  Psychiatric:        Mood and Affect: Mood normal.        Behavior: Behavior normal.         Assessment & Plan:  Lumbo Sacral Spondylosis: Chronic Bilateral Low Back Pain: Continue HEP as Tolerated. Continue to Monitor. S/P On 09/12/2023, with Dr Wynn Banker.  Bilateral Lumbar L3, L4 medial branch blocks and L 5 dorsal ramus injection under fluoroscopic guidance . Good relief noted. 10/17/2023 2. Right Greater Trochanter Bursitis: Continue to alternate Ice and Heat Therapy. Continue HEP as Tolerated. Continue to Monitor. 10/17/2023 3. Primary Osteoarthritis Right Knee: ortho Following. Continue to Monitor. 10/17/2023 4. Chronic Pain Syndrome: RX: Oxycodone 5/325 mg one tablet every 6 hours as needed #120. Discontinued  Hydrocodone 5/325 mg one tablet every 8 hours as needed for pain #90., ineffective We will continue the opioid monitoring program, this consists of regular clinic visits, examinations, urine drug screen, pill counts as well as use of [place] Controlled Substance Reporting system. A 12 month History has been reviewed on the [place] Controlled Substance Reporting System on 10/17/2023.    F/U in 1 month

## 2023-10-17 ENCOUNTER — Encounter: Payer: 59 | Attending: Physical Medicine & Rehabilitation | Admitting: Registered Nurse

## 2023-10-17 ENCOUNTER — Other Ambulatory Visit (HOSPITAL_COMMUNITY): Payer: Self-pay

## 2023-10-17 VITALS — BP 145/92 | HR 84 | Temp 98.5°F | Resp 16 | Ht 64.0 in | Wt 247.4 lb

## 2023-10-17 DIAGNOSIS — G8929 Other chronic pain: Secondary | ICD-10-CM

## 2023-10-17 DIAGNOSIS — G894 Chronic pain syndrome: Secondary | ICD-10-CM | POA: Diagnosis not present

## 2023-10-17 DIAGNOSIS — Z79891 Long term (current) use of opiate analgesic: Secondary | ICD-10-CM | POA: Diagnosis not present

## 2023-10-17 DIAGNOSIS — M1711 Unilateral primary osteoarthritis, right knee: Secondary | ICD-10-CM | POA: Diagnosis not present

## 2023-10-17 DIAGNOSIS — M47817 Spondylosis without myelopathy or radiculopathy, lumbosacral region: Secondary | ICD-10-CM

## 2023-10-17 DIAGNOSIS — M5416 Radiculopathy, lumbar region: Secondary | ICD-10-CM

## 2023-10-17 DIAGNOSIS — M545 Low back pain, unspecified: Secondary | ICD-10-CM | POA: Diagnosis not present

## 2023-10-17 DIAGNOSIS — Z5181 Encounter for therapeutic drug level monitoring: Secondary | ICD-10-CM

## 2023-10-17 DIAGNOSIS — M7061 Trochanteric bursitis, right hip: Secondary | ICD-10-CM

## 2023-10-17 MED ORDER — OXYCODONE-ACETAMINOPHEN 5-325 MG PO TABS: 1.0000 | ORAL_TABLET | Freq: Four times a day (QID) | ORAL | 0 refills | Status: DC | PRN

## 2023-10-17 MED ORDER — OXYCODONE-ACETAMINOPHEN 5-325 MG PO TABS
1.0000 | ORAL_TABLET | Freq: Four times a day (QID) | ORAL | 0 refills | Status: DC | PRN
  Filled 2023-10-17: qty 120, 30d supply, fill #0

## 2023-10-17 NOTE — Patient Instructions (Signed)
 Send a My- Chart message in a week with update of medication change.

## 2023-10-21 ENCOUNTER — Encounter: Payer: Self-pay | Admitting: Registered Nurse

## 2023-10-21 DIAGNOSIS — M1711 Unilateral primary osteoarthritis, right knee: Secondary | ICD-10-CM | POA: Diagnosis not present

## 2023-10-21 DIAGNOSIS — Z9889 Other specified postprocedural states: Secondary | ICD-10-CM | POA: Diagnosis not present

## 2023-10-24 ENCOUNTER — Other Ambulatory Visit: Payer: Self-pay

## 2023-10-24 ENCOUNTER — Other Ambulatory Visit: Payer: Self-pay | Admitting: Cardiology

## 2023-10-24 DIAGNOSIS — B009 Herpesviral infection, unspecified: Secondary | ICD-10-CM

## 2023-10-24 DIAGNOSIS — Z8673 Personal history of transient ischemic attack (TIA), and cerebral infarction without residual deficits: Secondary | ICD-10-CM

## 2023-10-25 ENCOUNTER — Other Ambulatory Visit: Payer: Self-pay

## 2023-10-25 ENCOUNTER — Other Ambulatory Visit (HOSPITAL_COMMUNITY): Payer: Self-pay

## 2023-10-25 MED ORDER — VEOZAH 45 MG PO TABS
1.0000 | ORAL_TABLET | Freq: Every day | ORAL | 0 refills | Status: DC
  Filled 2023-10-25: qty 90, 90d supply, fill #0

## 2023-10-25 MED ORDER — CLOPIDOGREL BISULFATE 75 MG PO TABS
75.0000 mg | ORAL_TABLET | Freq: Every day | ORAL | 0 refills | Status: DC
  Filled 2023-10-25: qty 90, 90d supply, fill #0

## 2023-10-25 MED ORDER — FUROSEMIDE 20 MG PO TABS
20.0000 mg | ORAL_TABLET | Freq: Every day | ORAL | 3 refills | Status: DC | PRN
  Filled 2023-10-25: qty 90, 90d supply, fill #0

## 2023-10-25 MED ORDER — PREGABALIN 150 MG PO CAPS
150.0000 mg | ORAL_CAPSULE | Freq: Two times a day (BID) | ORAL | 2 refills | Status: DC
  Filled 2023-10-25: qty 60, 30d supply, fill #0
  Filled 2023-12-17: qty 60, 30d supply, fill #1

## 2023-10-25 MED ORDER — VALACYCLOVIR HCL 500 MG PO TABS
500.0000 mg | ORAL_TABLET | Freq: Two times a day (BID) | ORAL | 1 refills | Status: DC
  Filled 2023-10-25: qty 10, 5d supply, fill #0
  Filled 2023-11-18 – 2023-11-29 (×2): qty 10, 5d supply, fill #1

## 2023-11-04 ENCOUNTER — Ambulatory Visit (INDEPENDENT_AMBULATORY_CARE_PROVIDER_SITE_OTHER)

## 2023-11-04 VITALS — BP 112/80 | HR 84 | Temp 98.2°F | Ht 64.0 in | Wt 244.0 lb

## 2023-11-04 DIAGNOSIS — E785 Hyperlipidemia, unspecified: Secondary | ICD-10-CM | POA: Diagnosis not present

## 2023-11-04 DIAGNOSIS — F332 Major depressive disorder, recurrent severe without psychotic features: Secondary | ICD-10-CM | POA: Diagnosis not present

## 2023-11-04 DIAGNOSIS — I471 Supraventricular tachycardia, unspecified: Secondary | ICD-10-CM | POA: Diagnosis not present

## 2023-11-04 DIAGNOSIS — R569 Unspecified convulsions: Secondary | ICD-10-CM

## 2023-11-04 DIAGNOSIS — M1711 Unilateral primary osteoarthritis, right knee: Secondary | ICD-10-CM | POA: Diagnosis not present

## 2023-11-04 DIAGNOSIS — Z01818 Encounter for other preprocedural examination: Secondary | ICD-10-CM | POA: Diagnosis not present

## 2023-11-04 DIAGNOSIS — I6330 Cerebral infarction due to thrombosis of unspecified cerebral artery: Secondary | ICD-10-CM | POA: Diagnosis not present

## 2023-11-04 LAB — POCT URINALYSIS DIP (CLINITEK)
Bilirubin, UA: NEGATIVE
Blood, UA: NEGATIVE
Glucose, UA: NEGATIVE mg/dL
Ketones, POC UA: NEGATIVE mg/dL
Leukocytes, UA: NEGATIVE
Nitrite, UA: NEGATIVE
POC PROTEIN,UA: NEGATIVE
Spec Grav, UA: 1.015 (ref 1.010–1.025)
Urobilinogen, UA: 0.2 U/dL
pH, UA: 6 (ref 5.0–8.0)

## 2023-11-04 NOTE — Assessment & Plan Note (Addendum)
 Patient' s GUPTA preoperative cardiac risk score was 0.1% which is LOW. REVISED CARDIAC RISK INDEX was 1 point with 6% risk.   Ordered blood work today EKG done in office in view of her history of Stroke and TIA- EKG as interpreted by me showed sinus rhythm, normal axis,  Overall, she is at moderately low risk for this knee replacement surgery being planned under Spinal anesthesia.  Discussed post operative pain management- defer to ortho. Post operative DVT prophylaxis- defer to ortho

## 2023-11-04 NOTE — Assessment & Plan Note (Signed)
 On Trintellix 20 mg daily Doing well.

## 2023-11-04 NOTE — Assessment & Plan Note (Signed)
 On metoprolol XL 50 mg daily

## 2023-11-04 NOTE — Assessment & Plan Note (Signed)
 Persistent right knee pain despite four cortisone injections, each providing relief for about a month. Considering knee replacement surgery. Preoperative evaluation was recommended  to ensure no contraindications and minimize complications. Surgery will be under spinal anesthesia, which is lower risk than general anesthesia.   She appears to have moderately low risk for her orthopedic surgery.  Explained to her that postoperative physical therapy is crucial for recovery.   - Order EKG and baseline blood work for preoperative evaluation   - I do not feel the need for chest x-ray   - Consult anesthesiologist regarding medications to hold before surgery -

## 2023-11-04 NOTE — Assessment & Plan Note (Signed)
 Not on a statin medication. Lipid panel well controlled as of her last blood work

## 2023-11-04 NOTE — Assessment & Plan Note (Signed)
 No recent seizures. Not on medication.

## 2023-11-04 NOTE — Assessment & Plan Note (Signed)
 Two strokes, last in 2019. On lifelong aspirin and Plavix therapy. Surgical team may require holding Plavix for five days prior to knee surgery. Aspirin may continue to prevent postoperative blood clots. - Continue aspirin therapy - Hold Plavix for five days prior to surgery if recommended by surgical team.
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
## 2023-11-05 ENCOUNTER — Other Ambulatory Visit (HOSPITAL_COMMUNITY): Payer: Self-pay

## 2023-11-05 LAB — CBC WITH DIFFERENTIAL/PLATELET
Basophils Absolute: 0 10*3/uL (ref 0.0–0.2)
Basos: 0 %
EOS (ABSOLUTE): 0.1 10*3/uL (ref 0.0–0.4)
Eos: 1 %
Hematocrit: 38.5 % (ref 34.0–46.6)
Hemoglobin: 12.1 g/dL (ref 11.1–15.9)
Immature Grans (Abs): 0 10*3/uL (ref 0.0–0.1)
Immature Granulocytes: 0 %
Lymphocytes Absolute: 2.5 10*3/uL (ref 0.7–3.1)
Lymphs: 30 %
MCH: 28.9 pg (ref 26.6–33.0)
MCHC: 31.4 g/dL — ABNORMAL LOW (ref 31.5–35.7)
MCV: 92 fL (ref 79–97)
Monocytes Absolute: 0.6 10*3/uL (ref 0.1–0.9)
Monocytes: 7 %
Neutrophils Absolute: 5.1 10*3/uL (ref 1.4–7.0)
Neutrophils: 62 %
Platelets: 207 10*3/uL (ref 150–450)
RBC: 4.19 x10E6/uL (ref 3.77–5.28)
RDW: 13.9 % (ref 11.7–15.4)
WBC: 8.3 10*3/uL (ref 3.4–10.8)

## 2023-11-05 LAB — COMPREHENSIVE METABOLIC PANEL WITH GFR
ALT: 22 IU/L (ref 0–32)
AST: 19 IU/L (ref 0–40)
Albumin: 4.2 g/dL (ref 3.9–4.9)
Alkaline Phosphatase: 63 IU/L (ref 44–121)
BUN/Creatinine Ratio: 14 (ref 9–23)
BUN: 17 mg/dL (ref 6–24)
Bilirubin Total: <0.2 mg/dL (ref 0.0–1.2)
CO2: 22 mmol/L (ref 20–29)
Calcium: 9.5 mg/dL (ref 8.7–10.2)
Chloride: 102 mmol/L (ref 96–106)
Creatinine, Ser: 1.24 mg/dL — ABNORMAL HIGH (ref 0.57–1.00)
Globulin, Total: 2.4 g/dL (ref 1.5–4.5)
Glucose: 83 mg/dL (ref 70–99)
Potassium: 4.4 mmol/L (ref 3.5–5.2)
Sodium: 141 mmol/L (ref 134–144)
Total Protein: 6.6 g/dL (ref 6.0–8.5)
eGFR: 53 mL/min/{1.73_m2} — ABNORMAL LOW (ref >59)

## 2023-11-05 MED ORDER — NYSTATIN 100000 UNIT/ML MT SUSP
5.0000 mL | Freq: Four times a day (QID) | OROMUCOSAL | 0 refills | Status: AC | PRN
  Filled 2023-11-05: qty 100, 5d supply, fill #0

## 2023-11-27 ENCOUNTER — Other Ambulatory Visit (HOSPITAL_COMMUNITY): Payer: Self-pay

## 2023-11-27 ENCOUNTER — Telehealth: Payer: Self-pay | Admitting: Registered Nurse

## 2023-11-27 MED ORDER — METHYLPREDNISOLONE 4 MG PO TBPK
ORAL_TABLET | ORAL | 0 refills | Status: DC
  Filled 2023-11-27: qty 21, 6d supply, fill #0

## 2023-11-27 NOTE — Telephone Encounter (Signed)
 Call placed to Desiree Russell, she reports increase intensity of lower back pain. Medrol  dose pak prescribed, she was instructed to call or send a My-Chart message on Friday, she verbalizes understanding.

## 2023-11-29 ENCOUNTER — Other Ambulatory Visit (HOSPITAL_COMMUNITY): Payer: Self-pay

## 2023-12-02 ENCOUNTER — Other Ambulatory Visit (HOSPITAL_COMMUNITY): Payer: Self-pay

## 2023-12-02 ENCOUNTER — Other Ambulatory Visit: Payer: Self-pay

## 2023-12-02 ENCOUNTER — Encounter: Attending: Physical Medicine & Rehabilitation | Admitting: Registered Nurse

## 2023-12-02 ENCOUNTER — Encounter: Payer: Self-pay | Admitting: Registered Nurse

## 2023-12-02 VITALS — BP 122/78 | HR 65 | Resp 16 | Wt 240.6 lb

## 2023-12-02 DIAGNOSIS — Z5181 Encounter for therapeutic drug level monitoring: Secondary | ICD-10-CM | POA: Diagnosis not present

## 2023-12-02 DIAGNOSIS — M546 Pain in thoracic spine: Secondary | ICD-10-CM | POA: Insufficient documentation

## 2023-12-02 DIAGNOSIS — M47817 Spondylosis without myelopathy or radiculopathy, lumbosacral region: Secondary | ICD-10-CM | POA: Insufficient documentation

## 2023-12-02 DIAGNOSIS — Z79891 Long term (current) use of opiate analgesic: Secondary | ICD-10-CM | POA: Insufficient documentation

## 2023-12-02 DIAGNOSIS — G43909 Migraine, unspecified, not intractable, without status migrainosus: Secondary | ICD-10-CM

## 2023-12-02 DIAGNOSIS — G8929 Other chronic pain: Secondary | ICD-10-CM | POA: Insufficient documentation

## 2023-12-02 DIAGNOSIS — G894 Chronic pain syndrome: Secondary | ICD-10-CM | POA: Diagnosis not present

## 2023-12-02 DIAGNOSIS — M1711 Unilateral primary osteoarthritis, right knee: Secondary | ICD-10-CM | POA: Diagnosis not present

## 2023-12-02 DIAGNOSIS — M545 Low back pain, unspecified: Secondary | ICD-10-CM | POA: Diagnosis not present

## 2023-12-02 MED ORDER — OXYCODONE HCL 10 MG PO TABS
10.0000 mg | ORAL_TABLET | Freq: Three times a day (TID) | ORAL | 0 refills | Status: DC | PRN
  Filled 2023-12-02: qty 90, 30d supply, fill #0

## 2023-12-02 MED ORDER — OXYCODONE HCL 10 MG PO TABS
10.0000 mg | ORAL_TABLET | Freq: Four times a day (QID) | ORAL | 0 refills | Status: DC | PRN
  Filled 2023-12-02: qty 120, 30d supply, fill #0

## 2023-12-02 NOTE — Progress Notes (Signed)
 Subjective:    Patient ID: Desiree Russell, female    DOB: [birth year], 53 y.o.   MRN: [id number]  HPI: Desiree Russell is a 53 y.o. female who returns for follow up appointment for chronic pain and medication refill. She states her pain is located in her mid- lower back and right knee  pain, she reports 3 hours of relief  and no relief with medrol  dose pak.  She rates her pain 7.Her current exercise regime is walking and performing stretching exercises.  Desiree Russell Morphine  equivalent is 30.00 MME.       Pain Inventory Average Pain 6 Pain Right Now 7 My pain is constant, aching, and throbbing  In the last 24 hours, has pain interfered with the following? General activity 5 Relation with others 5 Enjoyment of life 5 What TIME of day is your pain at its worst? evening Sleep (in general) Fair  Pain is worse with: walking, bending, standing, and some activites Pain improves with: rest and medication Relief from Meds: 5  Family History  Problem Relation Age of Onset   Hypertension Mother    Hypertension Father    Heart attack Father 64   Healthy Sister    Healthy Sister    Healthy Sister    Hypertension Brother    Diabetes type II Brother    COPD Brother    Healthy Brother    Healthy Brother    Breast cancer Maternal Aunt    Throat cancer Maternal Uncle    Hypertension Maternal Grandmother    Diabetes Paternal Surveyor, minerals    Healthy Daughter    Healthy Son    Healthy Son    Healthy Son    Colon cancer Neg Hx    Pancreatic cancer Neg Hx    Stomach cancer Neg Hx    Liver disease Neg Hx    Esophageal cancer Neg Hx    Rectal cancer Neg Hx    Social History   Socioeconomic History   Marital status: Married    Spouse name: Not on file   Number of children: 4   Years of education: Not on file   Highest education level: Bachelor's degree (e.g., BA, AB, BS)  Occupational History   Not on file  Tobacco Use   Smoking status: Never   Smokeless tobacco: Never  Vaping Use    Vaping status: Never Used  Substance and Sexual Activity   Alcohol use: Yes    Alcohol/week: 1.0 standard drink of alcohol    Types: 1 Glasses of wine per week    Comment: occ    Drug use: No   Sexual activity: Yes    Partners: Male    Birth control/protection: Surgical  Other Topics Concern   Not on file  Social History Narrative   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     Social Drivers of Corporate investment banker Strain: Low Risk  (11/04/2023)   Overall Financial Resource Strain (CARDIA)    Difficulty of Paying Living Expenses: Not hard at all  Food Insecurity: No Food Insecurity (11/04/2023)   Hunger Vital Sign    Worried About Running Out of Food in the Last Year: Never true    Ran Out of Food in the Last Year: Never true  Transportation Needs: No Transportation Needs (11/04/2023)   PRAPARE - Administrator, Civil Service (Medical): No    Lack of Transportation (Non-Medical): No  Physical Activity: Sufficiently Active (11/04/2023)  Exercise Vital Sign    Days of Exercise per Week: 5 days    Minutes of Exercise per Session: 30 min  Stress: No Stress Concern Present (11/04/2023)   [name] of Occupational Health - Occupational Stress Questionnaire    Feeling of Stress : Only a little  Social Connections: Moderately Integrated (11/04/2023)   Social Connection and Isolation Panel [NHANES]    Frequency of Communication with Friends and Family: Three times a week    Frequency of Social Gatherings with Friends and Family: Once a week    Attends Religious Services: More than 4 times per year    Active Member of Clubs or Organizations: No    Attends Engineer, structural: Not on file    Marital Status: Married   Past Surgical History:  Procedure Laterality Date   ABDOMINAL HYSTERECTOMY     CHOLECYSTECTOMY     CHOLECYSTECTOMY, LAPAROSCOPIC     COLONOSCOPY     KNEE ARTHROSCOPY Right 01/02/2023   UPPER GASTROINTESTINAL ENDOSCOPY      Past Surgical History:  Procedure Laterality Date   ABDOMINAL HYSTERECTOMY     CHOLECYSTECTOMY     CHOLECYSTECTOMY, LAPAROSCOPIC     COLONOSCOPY     KNEE ARTHROSCOPY Right 01/02/2023   UPPER GASTROINTESTINAL ENDOSCOPY     Past Medical History:  Diagnosis Date   Allergy    Anemia    Anxiety    Arthritis    Dyslipidemia    Fibromyalgia    GERD (gastroesophageal reflux disease)    Headache    Migraines    Pericarditis    Stroke (HCC) 12/2020   Tachycardia    TIA (transient ischemic attack)    Recurrent   BP 122/78   Pulse 65   Resp 16   Wt 240 lb 9.6 oz (109.1 kg)   SpO2 95%   BMI 41.30 kg/m   Opioid Risk Score:   Fall Risk Score:  `1  Depression screen [facility] 2/9     11/04/2023    3:11 PM 10/17/2023    3:12 PM 09/26/2023    3:38 PM 09/12/2023   12:57 PM 08/08/2023    3:42 PM 03/19/2023    2:08 PM 03/11/2023    9:04 AM  Depression screen PHQ 2/9  Decreased Interest 0 0 0 0 0 0 0  Down, Depressed, Hopeless 0 0 0 0 0 0 0  PHQ - 2 Score 0 0 0 0 0 0 0  Altered sleeping 0     3 1  Tired, decreased energy 0     1 3  Change in appetite 0     2 3  Feeling bad or failure about yourself  0     0 0  Trouble concentrating 0     1 1  Moving slowly or fidgety/restless 0     0 0  Suicidal thoughts 0     0 0  PHQ-9 Score 0     7 8  Difficult doing work/chores Not difficult at all     Not difficult at all Not difficult at all     Review of Systems  Musculoskeletal:        Right knee Upper, mid and lower back        Objective:   Physical Exam Vitals and nursing note reviewed.  Constitutional:      Appearance: Normal appearance.  Cardiovascular:     Rate and Rhythm: Normal rate and regular rhythm.     Pulses: Normal  pulses.     Heart sounds: Normal heart sounds.  Pulmonary:     Effort: Pulmonary effort is normal.     Breath sounds: Normal breath sounds.  Musculoskeletal:     Comments: Normal Muscle Bulk and Muscle Testing Reveals:  Upper Extremities: Full ROM  and Muscle Strength 5/5 Lumbar Paraspinal Tenderness: L-4-L-5 Lower Extremities: Right : Decreased ROM and Muscle Strength 5/5 Right Lower Extremity Flexion Produces Pain into Right Patella Left Lower Extremity: Full ROM and Muscle Strength 5/5 Arises from Table slowly Antalgic   Gait     Skin:    General: Skin is warm and dry.  Neurological:     Mental Status: She is alert and oriented to person, place, and time.  Psychiatric:        Mood and Affect: Mood normal.        Behavior: Behavior normal.         Assessment & Plan:  Lumbo Sacral Spondylosis: Chronic Bilateral Low Back Pain: Continue HEP as Tolerated. Continue to Monitor. S/P On 09/12/2023, with Dr Sharl Davies.  Bilateral Lumbar L3, L4 medial branch blocks and L 5 dorsal ramus injection under fluoroscopic guidance . Good relief noted. 12/02/2023 2. Bilateral Greater Trochanter Bursitis: Continue HEP as Tolerated. Continue to Monitor. No Complaints today. Continue to Monitor.  3. Primary Osteoarthritis Right Knee: ortho Following. Continue to Monitor. 12/02/2023 4. Chronic Pain Syndrome: Increased: Refilled: Oxycodone  10 mg one tablet 4 times a day as needed for pain #120.Aaron Aas We will continue the opioid monitoring program, this consists of regular clinic visits, examinations, urine drug screen, pill counts as well as use of [place]  Controlled Substance Reporting system. A 12 month History has been reviewed on the [place]  Controlled Substance Reporting System on 12/02/2023.    F/U in 1 month

## 2023-12-03 ENCOUNTER — Other Ambulatory Visit (HOSPITAL_COMMUNITY): Payer: Self-pay

## 2023-12-03 MED ORDER — EMGALITY 120 MG/ML ~~LOC~~ SOAJ
120.0000 mg | SUBCUTANEOUS | 2 refills | Status: DC
  Filled 2023-12-03 – 2023-12-16 (×2): qty 3, 90d supply, fill #0
  Filled 2024-03-25: qty 3, 90d supply, fill #1
  Filled 2024-07-07 (×2): qty 3, 90d supply, fill #2

## 2023-12-06 ENCOUNTER — Other Ambulatory Visit (HOSPITAL_COMMUNITY): Payer: Self-pay

## 2023-12-11 ENCOUNTER — Encounter (HOSPITAL_COMMUNITY): Payer: Self-pay | Admitting: Internal Medicine

## 2023-12-11 ENCOUNTER — Emergency Department (HOSPITAL_COMMUNITY)

## 2023-12-11 ENCOUNTER — Inpatient Hospital Stay (HOSPITAL_COMMUNITY)
Admission: EM | Admit: 2023-12-11 | Discharge: 2023-12-13 | DRG: 101 | Disposition: A | Discharge status: Home / Self Care | Attending: Internal Medicine | Admitting: Internal Medicine

## 2023-12-11 ENCOUNTER — Other Ambulatory Visit: Payer: Self-pay

## 2023-12-11 ENCOUNTER — Observation Stay (HOSPITAL_COMMUNITY)

## 2023-12-11 DIAGNOSIS — E66812 Obesity, class 2: Secondary | ICD-10-CM | POA: Diagnosis present

## 2023-12-11 DIAGNOSIS — R569 Unspecified convulsions: Principal | ICD-10-CM

## 2023-12-11 DIAGNOSIS — R29818 Other symptoms and signs involving the nervous system: Secondary | ICD-10-CM | POA: Diagnosis not present

## 2023-12-11 DIAGNOSIS — R001 Bradycardia, unspecified: Secondary | ICD-10-CM | POA: Diagnosis not present

## 2023-12-11 DIAGNOSIS — K219 Gastro-esophageal reflux disease without esophagitis: Secondary | ICD-10-CM | POA: Diagnosis not present

## 2023-12-11 DIAGNOSIS — I63521 Cerebral infarction due to unspecified occlusion or stenosis of right anterior cerebral artery: Secondary | ICD-10-CM | POA: Diagnosis not present

## 2023-12-11 DIAGNOSIS — M1711 Unilateral primary osteoarthritis, right knee: Secondary | ICD-10-CM | POA: Diagnosis present

## 2023-12-11 DIAGNOSIS — G40A09 Absence epileptic syndrome, not intractable, without status epilepticus: Secondary | ICD-10-CM | POA: Diagnosis not present

## 2023-12-11 DIAGNOSIS — E669 Obesity, unspecified: Secondary | ICD-10-CM | POA: Diagnosis not present

## 2023-12-11 DIAGNOSIS — Z9049 Acquired absence of other specified parts of digestive tract: Secondary | ICD-10-CM

## 2023-12-11 DIAGNOSIS — F32A Depression, unspecified: Secondary | ICD-10-CM | POA: Diagnosis present

## 2023-12-11 DIAGNOSIS — I10 Essential (primary) hypertension: Secondary | ICD-10-CM | POA: Diagnosis not present

## 2023-12-11 DIAGNOSIS — G40909 Epilepsy, unspecified, not intractable, without status epilepticus: Secondary | ICD-10-CM | POA: Diagnosis not present

## 2023-12-11 DIAGNOSIS — Z6838 Body mass index (BMI) 38.0-38.9, adult: Secondary | ICD-10-CM

## 2023-12-11 DIAGNOSIS — Z825 Family history of asthma and other chronic lower respiratory diseases: Secondary | ICD-10-CM

## 2023-12-11 DIAGNOSIS — G9389 Other specified disorders of brain: Secondary | ICD-10-CM | POA: Diagnosis not present

## 2023-12-11 DIAGNOSIS — Z8673 Personal history of transient ischemic attack (TIA), and cerebral infarction without residual deficits: Secondary | ICD-10-CM | POA: Diagnosis not present

## 2023-12-11 DIAGNOSIS — Z803 Family history of malignant neoplasm of breast: Secondary | ICD-10-CM

## 2023-12-11 DIAGNOSIS — R4182 Altered mental status, unspecified: Secondary | ICD-10-CM | POA: Diagnosis not present

## 2023-12-11 DIAGNOSIS — R9431 Abnormal electrocardiogram [ECG] [EKG]: Secondary | ICD-10-CM | POA: Diagnosis not present

## 2023-12-11 DIAGNOSIS — Z8249 Family history of ischemic heart disease and other diseases of the circulatory system: Secondary | ICD-10-CM

## 2023-12-11 DIAGNOSIS — Z7982 Long term (current) use of aspirin: Secondary | ICD-10-CM

## 2023-12-11 DIAGNOSIS — R531 Weakness: Secondary | ICD-10-CM

## 2023-12-11 DIAGNOSIS — Z833 Family history of diabetes mellitus: Secondary | ICD-10-CM

## 2023-12-11 DIAGNOSIS — R9089 Other abnormal findings on diagnostic imaging of central nervous system: Secondary | ICD-10-CM | POA: Diagnosis not present

## 2023-12-11 DIAGNOSIS — F331 Major depressive disorder, recurrent, moderate: Secondary | ICD-10-CM

## 2023-12-11 DIAGNOSIS — F419 Anxiety disorder, unspecified: Secondary | ICD-10-CM | POA: Diagnosis present

## 2023-12-11 DIAGNOSIS — I69351 Hemiplegia and hemiparesis following cerebral infarction affecting right dominant side: Secondary | ICD-10-CM

## 2023-12-11 DIAGNOSIS — F4310 Post-traumatic stress disorder, unspecified: Secondary | ICD-10-CM | POA: Diagnosis present

## 2023-12-11 DIAGNOSIS — E785 Hyperlipidemia, unspecified: Secondary | ICD-10-CM | POA: Diagnosis present

## 2023-12-11 DIAGNOSIS — Z885 Allergy status to narcotic agent status: Secondary | ICD-10-CM

## 2023-12-11 DIAGNOSIS — M797 Fibromyalgia: Secondary | ICD-10-CM | POA: Diagnosis present

## 2023-12-11 DIAGNOSIS — Z7902 Long term (current) use of antithrombotics/antiplatelets: Secondary | ICD-10-CM

## 2023-12-11 DIAGNOSIS — R0902 Hypoxemia: Secondary | ICD-10-CM | POA: Diagnosis not present

## 2023-12-11 DIAGNOSIS — Z79899 Other long term (current) drug therapy: Secondary | ICD-10-CM

## 2023-12-11 DIAGNOSIS — Z808 Family history of malignant neoplasm of other organs or systems: Secondary | ICD-10-CM

## 2023-12-11 LAB — COMPREHENSIVE METABOLIC PANEL WITH GFR
ALT: 18 U/L (ref 0–44)
AST: 22 U/L (ref 15–41)
Albumin: 3.6 g/dL (ref 3.5–5.0)
Alkaline Phosphatase: 33 U/L — ABNORMAL LOW (ref 38–126)
Anion gap: 8 (ref 5–15)
BUN: 13 mg/dL (ref 6–20)
CO2: 27 mmol/L (ref 22–32)
Calcium: 9 mg/dL (ref 8.9–10.3)
Chloride: 104 mmol/L (ref 98–111)
Creatinine, Ser: 1.13 mg/dL — ABNORMAL HIGH (ref 0.44–1.00)
GFR, Estimated: 59 mL/min — ABNORMAL LOW (ref >60)
Glucose, Bld: 107 mg/dL — ABNORMAL HIGH (ref 70–99)
Potassium: 3.7 mmol/L (ref 3.5–5.1)
Sodium: 139 mmol/L (ref 135–145)
Total Bilirubin: 0.3 mg/dL (ref 0.0–1.2)
Total Protein: 6.5 g/dL (ref 6.5–8.1)

## 2023-12-11 LAB — CBC
HCT: 38.2 % (ref 36.0–46.0)
Hemoglobin: 12.7 g/dL (ref 12.0–15.0)
MCH: 30.4 pg (ref 26.0–34.0)
MCHC: 33.2 g/dL (ref 30.0–36.0)
MCV: 91.4 fL (ref 80.0–100.0)
Platelets: 172 10*3/uL (ref 150–400)
RBC: 4.18 MIL/uL (ref 3.87–5.11)
RDW: 14.4 % (ref 11.5–15.5)
WBC: 5.2 10*3/uL (ref 4.0–10.5)
nRBC: 0 % (ref 0.0–0.2)

## 2023-12-11 LAB — PROTIME-INR
INR: 1.1 (ref 0.8–1.2)
Prothrombin Time: 14.1 s (ref 11.4–15.2)

## 2023-12-11 LAB — CBG MONITORING, ED: Glucose-Capillary: 123 mg/dL — ABNORMAL HIGH (ref 70–99)

## 2023-12-11 LAB — I-STAT CHEM 8, ED
BUN: 15 mg/dL (ref 6–20)
Calcium, Ion: 1.16 mmol/L (ref 1.15–1.40)
Chloride: 99 mmol/L (ref 98–111)
Creatinine, Ser: 1.1 mg/dL — ABNORMAL HIGH (ref 0.44–1.00)
Glucose, Bld: 100 mg/dL — ABNORMAL HIGH (ref 70–99)
HCT: 42 % (ref 36.0–46.0)
Hemoglobin: 14.3 g/dL (ref 12.0–15.0)
Potassium: 3.6 mmol/L (ref 3.5–5.1)
Sodium: 139 mmol/L (ref 135–145)
TCO2: 28 mmol/L (ref 22–32)

## 2023-12-11 LAB — DIFFERENTIAL
Abs Immature Granulocytes: 0.01 10*3/uL (ref 0.00–0.07)
Basophils Absolute: 0 10*3/uL (ref 0.0–0.1)
Basophils Relative: 0 %
Eosinophils Absolute: 0.1 10*3/uL (ref 0.0–0.5)
Eosinophils Relative: 1 %
Immature Granulocytes: 0 %
Lymphocytes Relative: 38 %
Lymphs Abs: 2 10*3/uL (ref 0.7–4.0)
Monocytes Absolute: 0.4 10*3/uL (ref 0.1–1.0)
Monocytes Relative: 8 %
Neutro Abs: 2.7 10*3/uL (ref 1.7–7.7)
Neutrophils Relative %: 53 %

## 2023-12-11 LAB — RAPID URINE DRUG SCREEN, HOSP PERFORMED
Amphetamines: NOT DETECTED
Barbiturates: NOT DETECTED
Benzodiazepines: POSITIVE — AB
Cocaine: NOT DETECTED
Opiates: NOT DETECTED
Tetrahydrocannabinol: NOT DETECTED

## 2023-12-11 LAB — ETHANOL: Alcohol, Ethyl (B): <15 mg/dL (ref <15)

## 2023-12-11 LAB — APTT: aPTT: 28 s (ref 24–36)

## 2023-12-11 MED ORDER — PANTOPRAZOLE SODIUM 40 MG PO TBEC
40.0000 mg | DELAYED_RELEASE_TABLET | Freq: Every day | ORAL | Status: DC
  Administered 2023-12-11 – 2023-12-13 (×3): 40 mg via ORAL
  Filled 2023-12-11 (×3): qty 1

## 2023-12-11 MED ORDER — OXYCODONE HCL 5 MG PO TABS
10.0000 mg | ORAL_TABLET | Freq: Four times a day (QID) | ORAL | Status: DC | PRN
  Administered 2023-12-12 – 2023-12-13 (×3): 10 mg via ORAL
  Filled 2023-12-11 (×3): qty 2

## 2023-12-11 MED ORDER — ONDANSETRON HCL 4 MG PO TABS: 4.0000 mg | ORAL_TABLET | Freq: Four times a day (QID) | ORAL | Status: DC | PRN

## 2023-12-11 MED ORDER — ACETAMINOPHEN 650 MG RE SUPP: 650.0000 mg | Freq: Four times a day (QID) | RECTAL | Status: DC | PRN

## 2023-12-11 MED ORDER — CLOPIDOGREL BISULFATE 75 MG PO TABS
75.0000 mg | ORAL_TABLET | Freq: Every day | ORAL | Status: DC
  Administered 2023-12-12 – 2023-12-13 (×2): 75 mg via ORAL
  Filled 2023-12-11 (×2): qty 1

## 2023-12-11 MED ORDER — ASPIRIN 81 MG PO TBEC
81.0000 mg | DELAYED_RELEASE_TABLET | Freq: Every day | ORAL | Status: DC
  Administered 2023-12-12 – 2023-12-13 (×2): 81 mg via ORAL
  Filled 2023-12-11 (×2): qty 1

## 2023-12-11 MED ORDER — LORAZEPAM 2 MG/ML IJ SOLN
2.0000 mg | Freq: Once | INTRAMUSCULAR | Status: AC
  Administered 2023-12-11: 2 mg via INTRAMUSCULAR
  Filled 2023-12-11: qty 1

## 2023-12-11 MED ORDER — FEZOLINETANT 45 MG PO TABS: 1.0000 | ORAL_TABLET | Freq: Every day | ORAL | Status: DC

## 2023-12-11 MED ORDER — METOPROLOL SUCCINATE ER 50 MG PO TB24
50.0000 mg | ORAL_TABLET | Freq: Every day | ORAL | Status: DC
  Administered 2023-12-11 – 2023-12-12 (×2): 50 mg via ORAL
  Filled 2023-12-11 (×2): qty 1

## 2023-12-11 MED ORDER — ONDANSETRON HCL 4 MG/2ML IJ SOLN: 4.0000 mg | Freq: Four times a day (QID) | INTRAMUSCULAR | Status: DC | PRN

## 2023-12-11 MED ORDER — SODIUM CHLORIDE 0.9% FLUSH
3.0000 mL | Freq: Two times a day (BID) | INTRAVENOUS | Status: DC
  Administered 2023-12-11 – 2023-12-13 (×5): 3 mL via INTRAVENOUS

## 2023-12-11 MED ORDER — ENOXAPARIN SODIUM 40 MG/0.4ML IJ SOSY
40.0000 mg | PREFILLED_SYRINGE | INTRAMUSCULAR | Status: DC
  Administered 2023-12-11 – 2023-12-12 (×2): 40 mg via SUBCUTANEOUS
  Filled 2023-12-11 (×2): qty 0.4

## 2023-12-11 MED ORDER — VORTIOXETINE HBR 20 MG PO TABS
20.0000 mg | ORAL_TABLET | Freq: Every day | ORAL | Status: DC
Start: 2023-12-12 — End: 2023-12-13
  Administered 2023-12-12 – 2023-12-13 (×2): 20 mg via ORAL
  Filled 2023-12-11 (×2): qty 1

## 2023-12-11 MED ORDER — OLANZAPINE 2.5 MG PO TABS
5.0000 mg | ORAL_TABLET | Freq: Every evening | ORAL | Status: DC
Start: 2023-12-11 — End: 2023-12-13
  Administered 2023-12-11 – 2023-12-12 (×2): 5 mg via ORAL
  Filled 2023-12-11 (×2): qty 2

## 2023-12-11 MED ORDER — FLUOXETINE HCL 20 MG PO CAPS
20.0000 mg | ORAL_CAPSULE | Freq: Every evening | ORAL | Status: DC
  Administered 2023-12-12: 20 mg via ORAL
  Filled 2023-12-11 (×2): qty 1

## 2023-12-11 MED ORDER — LORAZEPAM 2 MG/ML IJ SOLN
0.5000 mg | Freq: Once | INTRAMUSCULAR | Status: AC | PRN
  Administered 2023-12-11: 0.5 mg via INTRAVENOUS
  Filled 2023-12-11: qty 1

## 2023-12-11 MED ORDER — LORAZEPAM 2 MG/ML IJ SOLN: 2.0000 mg | Freq: Once | INTRAMUSCULAR | Status: DC

## 2023-12-11 MED ORDER — ACETAMINOPHEN 325 MG PO TABS
650.0000 mg | ORAL_TABLET | Freq: Four times a day (QID) | ORAL | Status: DC | PRN
  Administered 2023-12-11: 650 mg via ORAL
  Filled 2023-12-11: qty 2

## 2023-12-11 MED ORDER — ALBUTEROL SULFATE (2.5 MG/3ML) 0.083% IN NEBU: 2.5000 mg | INHALATION_SOLUTION | Freq: Four times a day (QID) | RESPIRATORY_TRACT | Status: DC | PRN

## 2023-12-11 NOTE — ED Notes (Signed)
 Help get patient on the monitor did EKG shown to er provider got patient some warm blankets patient is now gone to CT

## 2023-12-11 NOTE — Progress Notes (Signed)
 LTM EEG hooked up and running - no initial skin breakdown. Unmonitored at this time due to patient being in the ED.   CT leads used.

## 2023-12-11 NOTE — ED Notes (Signed)
 Pt is anxious and aggravated by all of the leads attached to her.  I have reached out to the Dr. Who ordered her meds to include Olanzapine . Pharm message sent to request quick verification.

## 2023-12-11 NOTE — Consult Note (Signed)
 NEUROLOGY CONSULT NOTE   Date of service: Dec 11, 2023 Patient Name: Desiree Russell MRN:  [id number] DOB:  1970/08/08 Chief Complaint: "Code Stroke, right sided weakness, seizure" Requesting Provider: Rosealee Concha, MD  History of Present Illness  Ebenezer Lazarin is a 53 y.o. female with hx of headaches, chronic cerebellar infarction on prior brain imaging, anxiety, PTSD fibromyalgia, seizure versus PNES presenting from work via EMS due to episode of staring off-and being provided the history of patient having history of similar seizures in the past.  EMS also noted his right-sided weakness. On EMS arrival coworkers stated that she had multiple events staring off into space with tears rolling down her face starting at 0945 today. When questioning patient she states that she did not go to work today. She is able to tell me that she is in the hospital and states it is January.    She has previous been seen by [facility] for seizure like episodes and stroke. She follows outpatient with Dr. Lugene Sahara at [facility] for [facility]rology.  Neurology recommended EMU as well but I do not see that has been completed. She is currently taking ASA and Plavix    After CT, she was taken for stat MRI which was negative for acute process.  She started then ripping out her IVs and yelling and becoming agitated.  Husband reports that behavioral changes have happened before but she has never been this agitated before  LKW: 0945 Modified rankin score: 1-No significant post stroke disability and can perform usual duties with stroke symptoms IV Thrombolysis: No, no stroke on MRI EVT: No, no LVO  NIHSS components Score: Comment  1a Level of Conscious 0[x]  1[]  2[]  3[]      1b LOC Questions 0[]  1[x]  2[]       1c LOC Commands 0[x]  1[]  2[]       2 Best Gaze 0[x]  1[]  2[]       3 Visual 0[x]  1[]  2[]  3[]      4 Facial Palsy 0[x]  1[]  2[]  3[]      5a Motor Arm - left 0[x]  1[]  2[]  3[]  4[]  UN[]    5b Motor Arm - Right 0[x]  1[]  2[]  3[]   4[]  UN[]    6a Motor Leg - Left 0[x]  1[]  2[]  3[]  4[]  UN[]    6b Motor Leg - Right 0[]  1[]  2[]  3[x]  4[]  UN[]    7 Limb Ataxia 0[x]  1[]  2[]  UN[]      8 Sensory 0[x]  1[]  2[]  UN[]      9 Best Language 0[x]  1[]  2[]  3[]      10 Dysarthria 0[x]  1[]  2[]  UN[]      11 Extinct. and Inattention 0[x]  1[]  2[]       TOTAL:4       ROS  Comprehensive ROS performed and pertinent positives documented in HPI   Past History   Past Medical History:  Diagnosis Date   Allergy    Anemia    Anxiety    Arthritis    Dyslipidemia    Fibromyalgia    GERD (gastroesophageal reflux disease)    Headache    Migraines    Pericarditis    Stroke (HCC) 12/2020   Tachycardia    TIA (transient ischemic attack)    Recurrent    Past Surgical History:  Procedure Laterality Date   ABDOMINAL HYSTERECTOMY     CHOLECYSTECTOMY     CHOLECYSTECTOMY, LAPAROSCOPIC     COLONOSCOPY     KNEE ARTHROSCOPY Right 01/02/2023   UPPER GASTROINTESTINAL ENDOSCOPY      Family History: Family History  Problem  Relation Age of Onset   Hypertension Mother    Hypertension Father    Heart attack Father 33   Healthy Sister    Healthy Sister    Healthy Sister    Hypertension Brother    Diabetes type II Brother    COPD Brother    Healthy Brother    Healthy Brother    Breast cancer Maternal Aunt    Throat cancer Maternal Uncle    Hypertension Maternal Grandmother    Diabetes Paternal Surveyor, minerals    Healthy Daughter    Healthy Son    Healthy Son    Healthy Son    Colon cancer Neg Hx    Pancreatic cancer Neg Hx    Stomach cancer Neg Hx    Liver disease Neg Hx    Esophageal cancer Neg Hx    Rectal cancer Neg Hx     Social History  reports that she has never smoked. She has never used smokeless tobacco. She reports current alcohol use of about 1.0 standard drink of alcohol per week. She reports that she does not use drugs.  Allergies  Allergen Reactions   Codeine Hives, Rash and Other (See Comments)    Other reaction(s):  Other (see comments), Hives, Hives , Hives, Hives , , Hives , Hives , Hives , Hives, Hives , Other reaction(s): Other (see comments), Hives, Other reaction(s): Other (see comments), Hives   Morphine  Hives and Other (See Comments)    Headaches, also   Morphine  And Codeine Hives    Medications   Current Facility-Administered Medications:    LORazepam  (ATIVAN ) injection 0.5 mg, 0.5 mg, Intravenous, Once PRN, Imogene Mana, NP  Current Outpatient Medications:    acetaminophen  (TYLENOL ) 325 MG tablet, Take 650 mg by mouth every 6 (six) hours as needed for mild pain, fever or headache., Disp: , Rfl:    aluminum  chloride (DRYSOL) 20 % external solution, Apply topically at bedtime., Disp: 35 mL, Rfl: 0   aspirin  81 MG EC tablet, Take 1 tablet (81 mg total) by mouth daily., Disp: 21 tablet, Rfl: 0   Calcium -Vitamin D -Vitamin K 650-12.5-40 MG-MCG-MCG CHEW, Chew 1 tablet by mouth daily., Disp: , Rfl:    clopidogrel  (PLAVIX ) 75 MG tablet, Take 1 tablet (75 mg total) by mouth once daily, Disp: 90 tablet, Rfl: 0   Fezolinetant  (VEOZAH ) 45 MG TABS, Take 1 tablet (45 mg total) by mouth daily., Disp: 90 tablet, Rfl: 0   FLUoxetine  (PROZAC ) 20 MG capsule, Take 1 capsule (20 mg total) by mouth every evening., Disp: 90 capsule, Rfl: 0   furosemide  (LASIX ) 20 MG tablet, Take 1 tablet by mouth daily as needed., Disp: 90 tablet, Rfl: 3   Galcanezumab -gnlm (EMGALITY ) 120 MG/ML SOAJ, Inject 120 mg into the skin every 30 (thirty) days., Disp: 3 mL, Rfl: 2   methylPREDNISolone  (MEDROL  DOSEPAK) 4 MG TBPK tablet, Take as directed on package, Disp: 21 tablet, Rfl: 0   metoprolol  succinate (TOPROL  XL) 50 MG 24 hr tablet, Take 1 tablet (50 mg total) by mouth at bedtime., Disp: 90 tablet, Rfl: 3   Multiple Vitamin (MULTIVITAMIN WITH MINERALS) TABS tablet, Take 1 tablet by mouth daily., Disp: , Rfl:    OLANZapine  (ZYPREXA ) 5 MG tablet, Take 1 tablet (5 mg total) by mouth every evening., Disp: 90 tablet, Rfl: 0   ondansetron   (ZOFRAN ) 4 MG tablet, Take 1 tablet by mouth every 8 hours as needed for nausea or vomiting., Disp: 30 tablet, Rfl: 3   ondansetron  (ZOFRAN -ODT) 4  MG disintegrating tablet, Take by mouth., Disp: , Rfl:    Oxycodone  HCl 10 MG TABS, Take 1 tablet (10 mg total) by mouth 4 (four) times daily as needed., Disp: 120 tablet, Rfl: 0   pantoprazole  (PROTONIX ) 40 MG tablet, Take 1 tablet by mouth daily., Disp: 90 tablet, Rfl: 1   pregabalin  (LYRICA ) 150 MG capsule, Take 1 capsule (150 mg total) by mouth 2 (two) times daily., Disp: 60 capsule, Rfl: 2   promethazine  (PHENERGAN ) 25 MG tablet, Take 1 tablet (25 mg total) by mouth every 8 (eight) hours as needed for nausea or vomiting., Disp: 20 tablet, Rfl: 0   senna-docusate (SENOKOT-S) 8.6-50 MG tablet, Take 2 tablets by mouth daily., Disp: 90 tablet, Rfl: 1   Ubrogepant  (UBRELVY ) 100 MG TABS, TAKE  1/2 TABLET BY MOUTH AT HEADACHE ONSET. MAY REPEAT DOSE IN TWO HOURS IF NO IMPROVEMENT. DO NOT EXCEED MORE THAN TWO TABLETS IN 24 HOURS., Disp: 18 tablet, Rfl: 1   valACYclovir  (VALTREX ) 500 MG tablet, Take 1 tablet (500 mg total) by mouth 2 (two) times daily for 5 days, Disp: 10 tablet, Rfl: 1   vortioxetine  HBr (TRINTELLIX ) 20 MG TABS tablet, Take 1 tablet (20 mg total) by mouth daily., Disp: 90 tablet, Rfl: 0  Vitals   Vitals:   12-17-23 1047 Dec 17, 2023 1048  SpO2: 100%   Weight:  109 kg  Height:  5\' 6"  (1.676 m)    Body mass index is 38.79 kg/m.  Physical Exam   Constitutional: Appears well-developed and well-nourished.  Psych: Confused, intermittently agitated  Eyes: No scleral injection.  HENT: No OP obstruction.  Head: Normocephalic.  Cardiovascular: Normal rate and regular rhythm.  Respiratory: Effort normal, non-labored breathing.  GI: Soft.  No distension. There is no tenderness.  Skin: WDI.   Neurologic Examination   Neuro: Mental Status: Patient is awake, alert, oriented to person, place, states date at Jan 2025. Does not remember going  to work or why she is in the hospital.  Cranial Nerves: II: Visual Fields are full. Pupils are equal, round, and reactive to light.   III,IV, VI: EOMI without ptosis or diploplia.  V: Facial sensation is symmetric to temperature VII: Facial movement is symmetric resting and smiling VIII: Hearing is intact to voice X: Palate elevates symmetrically XI: Shoulder shrug is symmetric. XII: Tongue protrudes midline without atrophy or fasciculations.  Motor: Tone is normal. Bulk is normal.  RUE 5/5 LUE 5/5  RLE 1/5 LLE 5/5 Sensory: Sensation is symmetric to light touch and temperature in the arms and legs. No extinction to DSS present.  Cerebellar: FNF intact, unable to complete HKS on the R.    Labs/Imaging/Neurodiagnostic studies   CBC:  Recent Labs  Lab 2023-12-17 1109  WBC 5.2  NEUTROABS 2.7  HGB 12.7  14.3  HCT 38.2  42.0  MCV 91.4  PLT 172   Basic Metabolic Panel:  Lab Results  Component Value Date   NA 139 17-Dec-2023   K 3.6 2023-12-17   CO2 22 11/04/2023   GLUCOSE 100 (H) Dec 17, 2023   BUN 15 Dec 17, 2023   CREATININE 1.10 (H) 2023-12-17   CALCIUM  9.5 11/04/2023   GFRNONAA 55 (L) 07/11/2023   GFRAA >60 12/12/2014   Lipid Panel:  Lab Results  Component Value Date   LDLCALC 78 08/16/2022   HgbA1c:  Lab Results  Component Value Date   HGBA1C 5.5 07/14/2021   Urine Drug Screen:     Component Value Date/Time   LABOPIA NONE DETECTED 03/10/2021  0957   COCAINSCRNUR NONE DETECTED 03/10/2021 0957   LABBENZ POSITIVE (A) 03/10/2021 0957   AMPHETMU NONE DETECTED 03/10/2021 0957   THCU NONE DETECTED 03/10/2021 0957   LABBARB NONE DETECTED 03/10/2021 0957    Alcohol Level     Component Value Date/Time   ETH <10 03/10/2021 0957   INR  Lab Results  Component Value Date   INR 1.1 03/10/2021   APTT  Lab Results  Component Value Date   APTT 28 01/20/2021    CT Head without contrast(Personally reviewed): 1. No CT evidence of acute intracranial  abnormality. 2. Redemonstrated chronic cerebellar infarcts. 3. ASPECTS is 10  MRI Brain(Personally reviewed): No acute intracranial abnormality. Multiple remote infarcts in the cerebellum, similar to prior. Remote lacunar infarct in the right thalamus. Small remote cortical infarct in the right frontal lobe. Nonspecific white matter signal abnormality likely reflecting mild chronic microvascular ischemic changes.  Neurodiagnostics rEEG:  Unremarkable  ASSESSMENT   Baley Altheide is a 53 y.o. female with significant history for fibromyalgia, headaches, seizure versus PNES, being brought from a place of work where she had a sudden onset of change in her mentation and staring concerning for seizures.  There has been concern in the chart for seizures versus pseudoseizures.  Recommendations have been made for EMU admission.  I do not see any EMU studies being completed.  Given her somewhat seizure-like behavior during these episodes, frontal lobe seizures are a possibility and long-term EEG would be helpful for spell capturing at this time.  MRI is unremarkable for acute process.  Ddx: frontal lobe seizure, PNES  RECOMMENDATIONS  Management of agitation per primary team Long-term EEG for spell capture Neurology will follow Plan discussed with the EDP Dr. Adrain Alar   ______________________________________________________________________    Signed, Imogene Mana, NP [facility]   Attending Neurohospitalist Addendum Patient seen and examined with APP/Resident. Agree with the history and physical as documented above. Agree with the plan as documented, which I helped formulate. I have independently reviewed the chart, obtained history, review of systems and examined the patient.I have personally reviewed pertinent head/neck/spine imaging (CT/MRI). Please feel free to call with any questions.  -- Tona Francis, MD Neurologist [facility] Pager: [phone]

## 2023-12-11 NOTE — ED Triage Notes (Signed)
 Pt bib ems from work for seizures. Pt has a history of absent seizures. Co workers report multiple seizures since 0945. Per ems pt was staring off into space upon arrival with tears rolling down her face. Pt is postictal. PMH: seizures, cva

## 2023-12-11 NOTE — Progress Notes (Signed)
 LTM EEG machine transferred from [phone]

## 2023-12-11 NOTE — Code Documentation (Signed)
 Stroke Response Nurse Documentation Code Documentation  Desiree Russell is a 53 y.o. female arriving to [facility]  via [facility] on 12/11/2023 with past medical hx of previous CVAs and seizures.. On aspirin  81 mg daily and clopidogrel  75 mg daily. Code stroke was activated by ED.   Patient from work where she was LKW at 0945 and now complaining of Rt leg weakness. Apparently, she was having what her co-workers took to be "absence seizures", and EMS treated with versed . EDP activated Code Stroke for rt leg weakness.  Labs drawn and patient cleared for CT by EDP. Patient met by Stroke Team in CT  with team. NIHSS 4, see documentation for details and code stroke times. Patient with disoriented and right leg weakness on exam. The following imaging was completed:  CT Head and MRI. Patient is not a candidate for IV Thrombolytic due to no stroke on imaging. Patient is not a candidate for IR due to no stroke on imaging.       Bedside handoff with ED RN complete.   Desiree Russell  Stroke Response RN

## 2023-12-11 NOTE — Progress Notes (Signed)
 EEG complete - results pending

## 2023-12-11 NOTE — Procedures (Signed)
 Patient Name: Desiree Russell  MRN: [id number]  Epilepsy Attending: Arleene Lack  Referring Physician/Provider: Imogene Mana, NP  Date: 12/11/2023 Duration: 23 mins  Patient history: 52yo f with staring spells. EEG to evaluate for seizure  Level of alertness: Awake, asleep  AEDs during EEG study: Ativan   Technical aspects: This EEG study was done with scalp electrodes positioned according to the 10-20 International system of electrode placement. Electrical activity was reviewed with band pass filter of 1-70Hz , sensitivity of 7 uV/mm, display speed of 13mm/sec with a 60Hz  notched filter applied as appropriate. EEG data were recorded continuously and digitally stored.  Video monitoring was available and reviewed as appropriate.  Description: The posterior dominant rhythm consists of 9-10 Hz activity of moderate voltage (25-35 uV) seen predominantly in posterior head regions, symmetric and reactive to eye opening and eye closing. Sleep was characterized by vertex waves, sleep spindles (12 to 14 Hz), maximal frontocentral region.  Hyperventilation and photic stimulation were not performed.     EKG artifact was seen during the study.  IMPRESSION: This study is within normal limits. No seizures or epileptiform discharges were seen throughout the recording.  A normal interictal EEG does not exclude the diagnosis of epilepsy.   Aviance [place] O Taytum Scheck

## 2023-12-11 NOTE — ED Notes (Signed)
 Gingerale provided per diet and pt req.

## 2023-12-11 NOTE — ED Provider Notes (Signed)
 Desiree Russell   CSN: [id number] Arrival date & time: 12/11/23  1041  An emergency department physician performed an initial assessment on this suspected stroke patient at 20.  History Seizures, CVA Chief Complaint  Patient presents with   Seizures      Pt bib ems from work for seizures. Pt has a history of absent seizures. Co workers report multiple seizures since 0945. Per ems pt was staring off into space upon arrival with tears rolling down her face. Pt is postictal. PMH: seizures, cva      Desiree Russell is a 53 y.o. female.  53 y.o female with a past medical history of seizures, CVA presents to the ED via EMS for seizure-like activity prior to arrival.  Patient with underlying history of absence seizure's, according to her does not take any medication.  Had multiple episodes seen by coworker, where she had her eyes staring off into space, tears rolling down her face, she appeared to be postictal.  According to patient she does not take any medication for her seizure-like activity.  She does take Plavix  along with aspirin  for prior CVA.  She also tells me that she suffered a fall yesterday in her bathroom, where she banged the left side of her head against the toilet.  She is endorsing a severe headache throughout. No meds given by EMS, no other complaints reported.   The history is provided by the patient and the EMS personnel.  Seizures      Home Medications Prior to Admission medications   Medication Sig Start Date End Date Taking? Authorizing Provider  acetaminophen  (TYLENOL ) 325 MG tablet Take 650 mg by mouth every 6 (six) hours as needed for mild pain, fever or headache.    [provider]  aluminum  chloride (DRYSOL) 20 % external solution Apply topically at bedtime. 09/16/23   Sirivol, Mamatha, MD  aspirin  81 MG EC tablet Take 1 tablet (81 mg total) by mouth daily. 01/23/21   Mikhail, Maryann, DO   Calcium -Vitamin D -Vitamin K 650-12.5-40 MG-MCG-MCG CHEW Chew 1 tablet by mouth daily.    [provider]  clopidogrel  (PLAVIX ) 75 MG tablet Take 1 tablet (75 mg total) by mouth once daily 10/25/23   Sirivol, Mamatha, MD  Fezolinetant  (VEOZAH ) 45 MG TABS Take 1 tablet (45 mg total) by mouth daily. 10/25/23 01/23/24  Sirivol, Mamatha, MD  FLUoxetine  (PROZAC ) 20 MG capsule Take 1 capsule (20 mg total) by mouth every evening. 09/16/23   Arfeen, Bronson Canny, MD  furosemide  (LASIX ) 20 MG tablet Take 1 tablet by mouth daily as needed. 10/25/23 01/23/24  Eilleen Grates, MD  Galcanezumab -gnlm (EMGALITY ) 120 MG/ML SOAJ Inject 120 mg into the skin every 30 (thirty) days. 12/03/23 03/05/24  Sirivol, Mamatha, MD  methylPREDNISolone  (MEDROL  DOSEPAK) 4 MG TBPK tablet Take as directed on package 11/27/23   Jodi Munroe, NP  metoprolol  succinate (TOPROL  XL) 50 MG 24 hr tablet Take 1 tablet (50 mg total) by mouth at bedtime. 05/14/23   Eilleen Grates, MD  Multiple Vitamin (MULTIVITAMIN WITH MINERALS) TABS tablet Take 1 tablet by mouth daily.    [provider]  OLANZapine  (ZYPREXA ) 5 MG tablet Take 1 tablet (5 mg total) by mouth every evening. 09/16/23   Arfeen, Bronson Canny, MD  ondansetron  (ZOFRAN ) 4 MG tablet Take 1 tablet by mouth every 8 hours as needed for nausea or vomiting. 06/03/23   Sirivol, Alla Ar, MD  ondansetron  (ZOFRAN -ODT) 4 MG disintegrating tablet Take  by mouth. 09/09/17   [provider]  Oxycodone  HCl 10 MG TABS Take 1 tablet (10 mg total) by mouth 4 (four) times daily as needed. 12/02/23   Jodi Munroe, NP  pantoprazole  (PROTONIX ) 40 MG tablet Take 1 tablet by mouth daily. 09/26/23   Lajuan Pila, MD  pregabalin  (LYRICA ) 150 MG capsule Take 1 capsule (150 mg total) by mouth 2 (two) times daily. 10/25/23 01/23/24  Sirivol, Mamatha, MD  promethazine  (PHENERGAN ) 25 MG tablet Take 1 tablet (25 mg total) by mouth every 8 (eight) hours as needed for nausea or vomiting. 03/20/23   Sirivol,  Alla Ar, MD  senna-docusate (SENOKOT-S) 8.6-50 MG tablet Take 2 tablets by mouth daily. 02/07/22   Allegra Arch, NP  Ubrogepant  (UBRELVY ) 100 MG TABS TAKE  1/2 TABLET BY MOUTH AT HEADACHE ONSET. MAY REPEAT DOSE IN TWO HOURS IF NO IMPROVEMENT. DO NOT EXCEED MORE THAN TWO TABLETS IN 24 HOURS. 02/23/22     valACYclovir  (VALTREX ) 500 MG tablet Take 1 tablet (500 mg total) by mouth 2 (two) times daily for 5 days 10/25/23   Sirivol, Mamatha, MD  vortioxetine  HBr (TRINTELLIX ) 20 MG TABS tablet Take 1 tablet (20 mg total) by mouth daily. 09/16/23   Arfeen, Bronson Canny, MD      Allergies    Codeine, Morphine , and Morphine  and codeine    Review of Systems   Review of Systems  Constitutional:  Negative for chills and fever.  Respiratory:  Negative for shortness of breath.   Cardiovascular:  Negative for chest pain.  Gastrointestinal:  Negative for abdominal pain and nausea.  Neurological:  Positive for seizures and headaches.  All other systems reviewed and are negative.   Physical Exam Updated Vital Signs BP 127/77   Pulse 78   Resp 15   Ht 5\' 6"  (1.676 m)   Wt 109 kg   SpO2 99%   BMI 38.79 kg/m  Physical Exam Vitals and nursing Russell reviewed.  HENT:     Head: Normocephalic and atraumatic.     Mouth/Throat:     Mouth: Mucous membranes are moist.  Eyes:     Pupils: Pupils are equal, round, and reactive to light.  Cardiovascular:     Rate and Rhythm: Normal rate.  Pulmonary:     Effort: Pulmonary effort is normal.     Breath sounds: No wheezing or rales.  Abdominal:     General: Abdomen is flat.     Palpations: Abdomen is soft.     Tenderness: There is no abdominal tenderness.  Musculoskeletal:     Cervical back: Normal range of motion and neck supple.  Skin:    General: Skin is warm and dry.  Neurological:     Mental Status: She is alert. She is disoriented.     Motor: Weakness present.     Comments: No facial asymmetry Moves upper extremities, has a hard time moving the right  leg. Speech is clear without any dysarthria. Appears disoriented, repeating questions, using inappropriate words for certain objects.     ED Results / Procedures / Treatments   Labs (all labs ordered are listed, but only abnormal results are displayed) Labs Reviewed  COMPREHENSIVE METABOLIC PANEL WITH GFR - Abnormal; Notable for the following components:      Result Value   Glucose, Bld 107 (*)    Creatinine, Ser 1.13 (*)    Alkaline Phosphatase 33 (*)    GFR, Estimated 59 (*)    All other components within normal  limits  RAPID URINE DRUG SCREEN, HOSP PERFORMED - Abnormal; Notable for the following components:   Benzodiazepines POSITIVE (*)    All other components within normal limits  CBG MONITORING, ED - Abnormal; Notable for the following components:   Glucose-Capillary 123 (*)    All other components within normal limits  I-STAT CHEM 8, ED - Abnormal; Notable for the following components:   Creatinine, Ser 1.10 (*)    Glucose, Bld 100 (*)    All other components within normal limits  PROTIME-INR  APTT  CBC  DIFFERENTIAL  ETHANOL    EKG EKG Interpretation Date/Time:  Wednesday Dec 11 2023 11:02:33 EDT Ventricular Rate:  91 PR Interval:  147 QRS Duration:  76 QT Interval:  340 QTC Calculation: 419 R Axis:   22  Text Interpretation: Sinus rhythm Borderline ST depression, diffuse leads Borderline ST elevation, lateral leads Confirmed by Rosealee Concha ([id number]) on 12/11/2023 1:27:22 PM  Radiology MR BRAIN WO CONTRAST Result Date: 12/11/2023 CLINICAL DATA:  Neuro deficit, concern for stroke. History of absence seizures. Multiple seizures this morning, staring off into space. EXAM: MRI HEAD WITHOUT CONTRAST TECHNIQUE: Multiplanar, multiecho pulse sequences of the brain and surrounding structures were obtained without intravenous contrast. COMPARISON:  Earlier same day CT head.  MRI head 03/16/2022. FINDINGS: Brain: No acute infarct. No evidence of intracranial hemorrhage.  Remote lacunar infarct in the right thalamus. Redemonstrated remote infarcts in the bilateral cerebellum, left greater than right. Focal gliosis within the right frontal cortex suggestive of remote cortical infarct. Scattered foci of T2/FLAIR hyperintensity in the periventricular and subcortical white matter similar to prior. No edema, mass effect, or midline shift. Normal appearance of midline structures. The basilar cisterns are patent. No extra-axial fluid collections. Ventricles: Normal size and configuration of the ventricles. Vascular: Skull base flow voids are visualized. Skull and upper cervical spine: Degenerative changes in the visualized cervical spine. Disc osteophyte complex at C3-4. The visualized calvarium is unremarkable. Sinuses/Orbits: Orbits are symmetric. Paranasal sinuses are clear. Other: Mastoid air cells are clear. IMPRESSION: No acute intracranial abnormality. Multiple remote infarcts in the cerebellum, similar to prior. Remote lacunar infarct in the right thalamus. Small remote cortical infarct in the right frontal lobe. Nonspecific white matter signal abnormality likely reflecting mild chronic microvascular ischemic changes. Electronically Signed   By: Denny Flack M.D.   On: 12/11/2023 11:59   CT HEAD CODE STROKE WO CONTRAST Result Date: 12/11/2023 CLINICAL DATA:  Code stroke. Neuro deficit, concern for stroke, right-sided weakness and altered mental status. EXAM: CT HEAD WITHOUT CONTRAST TECHNIQUE: Contiguous axial images were obtained from the base of the skull through the vertex without intravenous contrast. RADIATION DOSE REDUCTION: This exam was performed according to the departmental dose-optimization program which includes automated exposure control, adjustment of the mA and/or kV according to patient size and/or use of iterative reconstruction technique. COMPARISON:  CT head 02/20/2023. FINDINGS: Brain: No acute intracranial hemorrhage. No CT evidence of acute infarct. No  edema, mass effect, or midline shift. The basilar cisterns are patent. Redemonstrated chronic cerebellar infarcts. Ventricles: The ventricles are normal. Vascular: No hyperdense vessel or unexpected calcification. Skull: No acute or aggressive finding. Orbits: Orbits are symmetric. Sinuses: The visualized paranasal sinuses are clear. Other: Mastoid air cells are clear. ASPECTS [facility] Stroke Program Early CT Score) - Ganglionic level infarction (caudate, lentiform nuclei, internal capsule, insula, M1-M3 cortex): 7 - Supraganglionic infarction (M4-M6 cortex): 3 Total score (0-10 with 10 being normal): 10 IMPRESSION: 1. No CT evidence of acute intracranial  abnormality. 2. Redemonstrated chronic cerebellar infarcts. 3. ASPECTS is 10 These results were communicated to Dr. Arora at 11:23 am on 12/11/2023 by text page via the [facility] messaging system. Electronically Signed   By: Denny Flack M.D.   On: 12/11/2023 11:23    Procedures .Critical Care  Performed by: Lashawnta Burgert, PA-C Authorized by: Zunaira Lamy, PA-C   Critical care provider statement:    Critical care time (minutes):  45   Critical care start time:  12/11/2023 1:00 PM   Critical care end time:  12/11/2023 1:45 PM   Critical care was necessary to treat or prevent imminent or life-threatening deterioration of the following conditions:  CNS failure or compromise   Critical care was time spent personally by me on the following activities:  Evaluation of patient's response to treatment and discussions with primary provider   Care discussed with: admitting provider       Medications Ordered in ED Medications  LORazepam  (ATIVAN ) injection 0.5 mg (has no administration in time range)  LORazepam  (ATIVAN ) injection 2 mg (2 mg Intramuscular Given 12/11/23 1232)    ED Course/ Medical Decision Making/ A&P Clinical Course as of 12/11/23 1435  Wed Dec 11, 2023  1353 Benzodiazepines(!): POSITIVE Given ativan  while in the ED.  [JS]    Clinical  Course User Index [JS] Serayah Yazdani, PA-C                                 Medical Decision Making Amount and/or Complexity of Data Reviewed Labs: ordered. Decision-making details documented in ED Course. Radiology: ordered.  Risk Prescription drug management.     This patient presents to the ED for concern of seizure like activity, this involves a number of treatment options, and is a complaint that carries with it a high risk of complications and morbidity.  The differential diagnosis includes seizures, CVA versus electrolyte derangement.   Co morbidities: Discussed in HPI   Brief History:  See HPI.   EMR reviewed including pt PMHx, past surgical history and past visits to ER.   See HPI for more details   Lab Tests:  I ordered and independently interpreted labs.  The pertinent results include:    I personally reviewed all laboratory work and imaging. Metabolic panel without any acute abnormality specifically kidney function within normal limits and no significant electrolyte abnormalities. CBC without leukocytosis or significant anemia.  Imaging Studies:  CT Head without any acute noted. MRI Brain showed: IMPRESSION:  No acute intracranial abnormality.    Multiple remote infarcts in the cerebellum, similar to prior. Remote  lacunar infarct in the right thalamus. Small remote cortical infarct  in the right frontal lobe.    Nonspecific white matter signal abnormality likely reflecting mild  chronic microvascular ischemic changes.   Cardiac Monitoring:  The patient was maintained on a cardiac monitor.  I personally viewed and interpreted the cardiac monitored which showed an underlying rhythm of: NSR EKG non-ischemic   Medicines ordered:  I ordered medication including ativan   for symptomatic treatment.  Reevaluation of the patient after these medicines showed that the patient stayed the same I have reviewed the patients home medicines and have made  adjustments as needed  Consults:  I requested consultation with Dr. Evalee Hila neurology,  and discussed lab and imaging findings as well as pertinent plan - they recommend: EEG and will provide further recommendations.   Reevaluation:  After the interventions noted above  I re-evaluated patient and found that they have :stayed the same  Social Determinants of Health:  The patient's social determinants of health were a factor in the care of this patient  Problem List / ED Course:  Patient presented to the ED from work after having some episodes of staring into space, reports by coworker that she had tears running down her face, she was "spacing out ", multiple episodes started since 9 AM.  The last known normal was around 945.  Patient arrived to the ED here, does not allow us  to get her undressed, does not allow us  to do any workup, she is adamant that she is okay and wants to go home. Patient does not seem appropriate to me, I do notice weakness to her right leg, reports that she is unable to lift this off the stretcher.  Her upper extremities have good flexion and extension throughout, good strength.  No facial asymmetry, no dysarthria, no slurred speech.  She tells me she has a prior history of CVA, I am concerned for recurrent infarct therefore CT was ordered with concern for stroke. According to patient's record she is on Plavix  along with aspirin , she tells me she suffered a fall yesterday where she struck the left side of her head against the toilet, she states "I have a really bad headache ".  Patient was taken to CT after code stroke was initiated. Luminary CTs and MRIs were obtained which did not show any acute infarct.  I reviewed the case with Dr. Evalee Hila, she did receive a bedside EEG which did not show any abnormalities at this time. Interpretation of her blood work by me reveals CBC is unremarkable.  CMP without any electrolyte derangement to account for her symptoms.  PT and INR  normal.  CBG was normal.  She was given 2 mg of Ativan  IM to help with symptoms.  UDS was positive for benzos, however she was given these while in the emergency department. After review of records, it does look like she had something similar in the past, did not receive a full workup, therefore will be admitted at this time via hospitalist with neurology on board for further evaluation of seizure-like activity. Spoke to Dr. Felipe Horton, hospitalist who will admit patient for further workup.    Dispostion:  After consideration of the diagnostic results and the patients response to treatment, I feel that the patent would benefit from admission for further workup of seizure like activity.     Portions of this Russell were generated with Scientist, clinical (histocompatibility and immunogenetics). Dictation errors may occur despite best attempts at proofreading.   Final Clinical Impression(s) / ED Diagnoses Final diagnoses:  Seizure-like activity [facility])    Rx / DC Orders ED Discharge Orders     None         Luellen Sages, PA-C 12/11/23 1435    Rosealee Concha, MD 12/11/23 1932

## 2023-12-11 NOTE — H&P (Signed)
 History and Physical    Patient: Desiree Russell ZOX:[id number] DOB: [birth year] DOA: 12/11/2023 DOS: the patient was seen and examined on 12/11/2023 PCP: Sirivol, Mamatha, MD  Patient coming from: Work via EMS  Chief Complaint:  Chief Complaint  Patient presents with   Seizures      Pt bib ems from work for seizures. Pt has a history of absent seizures. Co workers report multiple seizures since 0945. Per ems pt was staring off into space upon arrival with tears rolling down her face. Pt is postictal. PMH: seizures, cva     HPI: Desiree Russell is a 53 y.o. female with medical history significant of hypertension, hyperlipidemia, prior CVA, anxiety, depression, PTSD, seizure versus PNES, and fibromyalgia who presents after being noted to have staring off spells by coworkers.  Per review of records patient had multiple episodes of staring off in space with tears rolling down her face starting at 9:45 AM.  Most of history is obtained from review of records as the patient still appears confused.  She does not recall these events and believes she was at home prior to coming to the hospital.  Followed in outpatient setting by Dr. Lugene Sahara at [facility], but reports she has not seen him in quite some time.  She was previously prescribed an anti-seizure medication, Depakote, but did not take it.  Patient notes having 4 prior EEG studies returning inconclusive results for which currently she does not want to stay in the hospital.  Denies having any recent fevers chills, nausea, vomiting, abdominal pain, or dysuria symptoms.  En route with EMS patient was noted to have right sided weakness.  In the emergency department Code stroke was activated.  Vital signs noted to be stable.  Labs appeared near patient's baseline.  Initial CT scan of the head and cervical spine did not reveal any acute abnormality.  Neurology had been consulted.  UDS positive for subsequent MRI of the brain was negative for any acute abnormality and  noted multiple remote infarcts in the cerebellum similar to prior studies.  Patient had been noted to be significantly agitated while in the ED and was given Ativan  for MRI.  Neurology recommended long-term monitoring EEG.   Review of Systems: As mentioned in the history of present illness. All other systems reviewed and are negative. Past Medical History:  Diagnosis Date   Allergy    Anemia    Anxiety    Arthritis    Dyslipidemia    Fibromyalgia    GERD (gastroesophageal reflux disease)    Headache    Migraines    Pericarditis    Stroke (HCC) 12/2020   Tachycardia    TIA (transient ischemic attack)    Recurrent   Past Surgical History:  Procedure Laterality Date   ABDOMINAL HYSTERECTOMY     CHOLECYSTECTOMY     CHOLECYSTECTOMY, LAPAROSCOPIC     COLONOSCOPY     KNEE ARTHROSCOPY Right 01/02/2023   UPPER GASTROINTESTINAL ENDOSCOPY     Social History:  reports that she has never smoked. She has never used smokeless tobacco. She reports current alcohol use of about 1.0 standard drink of alcohol per week. She reports that she does not use drugs.  Allergies  Allergen Reactions   Codeine Hives, Rash and Other (See Comments)    Other reaction(s): Other (see comments), Hives, Hives , Hives, Hives , , Hives , Hives , Hives , Hives, Hives , Other reaction(s): Other (see comments), Hives, Other reaction(s): Other (see comments), Hives   Morphine   Hives and Other (See Comments)    Headaches, also   Morphine  And Codeine Hives    Family History  Problem Relation Age of Onset   Hypertension Mother    Hypertension Father    Heart attack Father 52   Healthy Sister    Healthy Sister    Healthy Sister    Hypertension Brother    Diabetes type II Brother    COPD Brother    Healthy Brother    Healthy Brother    Breast cancer Maternal Aunt    Throat cancer Maternal Uncle    Hypertension Maternal Grandmother    Diabetes Paternal Surveyor, minerals    Healthy Daughter    Healthy Son     Healthy Son    Healthy Son    Colon cancer Neg Hx    Pancreatic cancer Neg Hx    Stomach cancer Neg Hx    Liver disease Neg Hx    Esophageal cancer Neg Hx    Rectal cancer Neg Hx     Prior to Admission medications   Medication Sig Start Date End Date Taking? Authorizing Provider  acetaminophen  (TYLENOL ) 325 MG tablet Take 650 mg by mouth every 6 (six) hours as needed for mild pain, fever or headache.    [provider]  aluminum  chloride (DRYSOL) 20 % external solution Apply topically at bedtime. 09/16/23   Sirivol, Mamatha, MD  aspirin  81 MG EC tablet Take 1 tablet (81 mg total) by mouth daily. 01/23/21   Mikhail, Maryann, DO  Calcium -Vitamin D -Vitamin K 650-12.5-40 MG-MCG-MCG CHEW Chew 1 tablet by mouth daily.    [provider]  clopidogrel  (PLAVIX ) 75 MG tablet Take 1 tablet (75 mg total) by mouth once daily 10/25/23   Sirivol, Mamatha, MD  Fezolinetant  (VEOZAH ) 45 MG TABS Take 1 tablet (45 mg total) by mouth daily. 10/25/23 01/23/24  Sirivol, Mamatha, MD  FLUoxetine  (PROZAC ) 20 MG capsule Take 1 capsule (20 mg total) by mouth every evening. 09/16/23   Arfeen, Bronson Canny, MD  furosemide  (LASIX ) 20 MG tablet Take 1 tablet by mouth daily as needed. 10/25/23 01/23/24  Eilleen Grates, MD  Galcanezumab -gnlm (EMGALITY ) 120 MG/ML SOAJ Inject 120 mg into the skin every 30 (thirty) days. 12/03/23 03/05/24  Sirivol, Mamatha, MD  methylPREDNISolone  (MEDROL  DOSEPAK) 4 MG TBPK tablet Take as directed on package 11/27/23   Jodi Munroe, NP  metoprolol  succinate (TOPROL  XL) 50 MG 24 hr tablet Take 1 tablet (50 mg total) by mouth at bedtime. 05/14/23   Eilleen Grates, MD  Multiple Vitamin (MULTIVITAMIN WITH MINERALS) TABS tablet Take 1 tablet by mouth daily.    [provider]  OLANZapine  (ZYPREXA ) 5 MG tablet Take 1 tablet (5 mg total) by mouth every evening. 09/16/23   Arfeen, Bronson Canny, MD  ondansetron  (ZOFRAN ) 4 MG tablet Take 1 tablet by mouth every 8 hours as needed for nausea or  vomiting. 06/03/23   Sirivol, Alla Ar, MD  ondansetron  (ZOFRAN -ODT) 4 MG disintegrating tablet Take by mouth. 09/09/17   [provider]  Oxycodone  HCl 10 MG TABS Take 1 tablet (10 mg total) by mouth 4 (four) times daily as needed. 12/02/23   Jodi Munroe, NP  pantoprazole  (PROTONIX ) 40 MG tablet Take 1 tablet by mouth daily. 09/26/23   Lajuan Pila, MD  pregabalin  (LYRICA ) 150 MG capsule Take 1 capsule (150 mg total) by mouth 2 (two) times daily. 10/25/23 01/23/24  Sirivol, Mamatha, MD  promethazine  (PHENERGAN ) 25 MG tablet Take 1 tablet (25 mg total)  by mouth every 8 (eight) hours as needed for nausea or vomiting. 03/20/23   Sirivol, Alla Ar, MD  senna-docusate (SENOKOT-S) 8.6-50 MG tablet Take 2 tablets by mouth daily. 02/07/22   Allegra Arch, NP  Ubrogepant  (UBRELVY ) 100 MG TABS TAKE  1/2 TABLET BY MOUTH AT HEADACHE ONSET. MAY REPEAT DOSE IN TWO HOURS IF NO IMPROVEMENT. DO NOT EXCEED MORE THAN TWO TABLETS IN 24 HOURS. 02/23/22     valACYclovir  (VALTREX ) 500 MG tablet Take 1 tablet (500 mg total) by mouth 2 (two) times daily for 5 days 10/25/23   Sirivol, Mamatha, MD  vortioxetine  HBr (TRINTELLIX ) 20 MG TABS tablet Take 1 tablet (20 mg total) by mouth daily. 09/16/23   Arturo Late, MD    Physical Exam: Vitals:   12/11/23 1047 12/11/23 1048 12/11/23 1300  BP:   127/77  Pulse:   78  Resp:   15  SpO2: 100%  99%  Weight:  109 kg   Height:  5\' 6"  (1.676 m)    Constitutional: Middle-aged female currently in no acute distress. Eyes: PERRL, lids and conjunctivae normal ENMT: Mucous membranes are moist. Posterior pharynx clear of any exudate or lesions.Normal dentition.  Neck: normal, supple, no masses, no thyromegaly Respiratory: clear to auscultation bilaterally, no wheezing, no crackles. Normal respiratory effort. No accessory muscle use.  Cardiovascular: Regular rate and rhythm, no murmurs / rubs / gallops. No extremity edema. 2+ pedal pulses. Abdomen: no tenderness, no masses  palpated. Bowel sounds positive.  Musculoskeletal: no clubbing / cyanosis. No joint deformity upper and lower extremities. Good ROM, no contractures. Normal muscle tone.  Skin: no rashes, lesions, ulcers. No induration Neurologic: CN 2-12 grossly intact.  Strength 2/5 in the right lower extremity.  Strength 5/5 in all other extremities. Psychiatric:   Alert and oriented to person and place.  Agitated mood.  Data Reviewed:  Normal sinus rhythm at 91 bpm with borderline ST depressions noted in diffuse leads.  Reviewed labs, imaging, and pertinent records as documented.  Assessment and Plan:  Seizure-like activity Patient presents after having several episodes of staring off spells while at work.  Records noted at some point patient had been on Depakote, but she reports no longer being on this medication.  Patient reports having four prior EEG studies which were reported to be inconclusive.  Review of records note EEG monitoring from 12/2020 was noted to be negative and PNES was brought up on the differential. Neurology recommending long-term EEG monitoring.   - Admit to a telemetry bed - Follow-up long-term monitoring EEG - Neurology consultative services we will follow-up for any further recommendations  Right-sided weakness History of CVA En route with EMS patient was noted to have some concerns for right sided weakness .  CT scan and subsequent MRI of the brain was negative for any acute abnormality, and previous strokes noted.  Question right-sided weakness possibly residual from previous strokes versus Todd's paralysis or complex migraine. - Continue to monitor  Essential hypertension Blood pressures currently stable. - Continue metoprolol   Anxiety and depression PTSD -Continue Prozac , olanzapine , and Trintellix   Fibromyalgia - Continue oxycodone  as needed  GERD - Continue Protonix   Obesity BMI 38.79 kg/m  DVT prophylaxis: Advance Care Planning:   Code Status: Full Code    Consults: Neurology  Family Communication: Daughter updated at bedside and husband over the phone  Severity of Illness: The appropriate patient status for this patient is OBSERVATION. Observation status is judged to be reasonable and necessary in order to  provide the required intensity of service to ensure the patient's safety. The patient's presenting symptoms, physical exam findings, and initial radiographic and laboratory data in the context of their medical condition is felt to place them at decreased risk for further clinical deterioration. Furthermore, it is anticipated that the patient will be medically stable for discharge from the hospital within 2 midnights of admission.   Author: Lena Qualia, MD 12/11/2023 2:30 PM  For on call review [email or web address].

## 2023-12-12 ENCOUNTER — Observation Stay (HOSPITAL_COMMUNITY)

## 2023-12-12 ENCOUNTER — Encounter (HOSPITAL_COMMUNITY): Payer: Self-pay | Admitting: Internal Medicine

## 2023-12-12 ENCOUNTER — Inpatient Hospital Stay (HOSPITAL_COMMUNITY)

## 2023-12-12 DIAGNOSIS — Z8673 Personal history of transient ischemic attack (TIA), and cerebral infarction without residual deficits: Secondary | ICD-10-CM | POA: Diagnosis not present

## 2023-12-12 DIAGNOSIS — Z808 Family history of malignant neoplasm of other organs or systems: Secondary | ICD-10-CM | POA: Diagnosis not present

## 2023-12-12 DIAGNOSIS — M797 Fibromyalgia: Secondary | ICD-10-CM | POA: Diagnosis not present

## 2023-12-12 DIAGNOSIS — G40A09 Absence epileptic syndrome, not intractable, without status epilepticus: Secondary | ICD-10-CM | POA: Diagnosis not present

## 2023-12-12 DIAGNOSIS — Z885 Allergy status to narcotic agent status: Secondary | ICD-10-CM | POA: Diagnosis not present

## 2023-12-12 DIAGNOSIS — K219 Gastro-esophageal reflux disease without esophagitis: Secondary | ICD-10-CM | POA: Diagnosis not present

## 2023-12-12 DIAGNOSIS — Z8249 Family history of ischemic heart disease and other diseases of the circulatory system: Secondary | ICD-10-CM | POA: Diagnosis not present

## 2023-12-12 DIAGNOSIS — I10 Essential (primary) hypertension: Secondary | ICD-10-CM | POA: Diagnosis not present

## 2023-12-12 DIAGNOSIS — R001 Bradycardia, unspecified: Secondary | ICD-10-CM | POA: Diagnosis not present

## 2023-12-12 DIAGNOSIS — R55 Syncope and collapse: Secondary | ICD-10-CM | POA: Diagnosis not present

## 2023-12-12 DIAGNOSIS — R9089 Other abnormal findings on diagnostic imaging of central nervous system: Secondary | ICD-10-CM

## 2023-12-12 DIAGNOSIS — I69351 Hemiplegia and hemiparesis following cerebral infarction affecting right dominant side: Secondary | ICD-10-CM | POA: Diagnosis not present

## 2023-12-12 DIAGNOSIS — F32A Depression, unspecified: Secondary | ICD-10-CM | POA: Diagnosis not present

## 2023-12-12 DIAGNOSIS — Z79899 Other long term (current) drug therapy: Secondary | ICD-10-CM | POA: Diagnosis not present

## 2023-12-12 DIAGNOSIS — F4310 Post-traumatic stress disorder, unspecified: Secondary | ICD-10-CM | POA: Diagnosis not present

## 2023-12-12 DIAGNOSIS — Z803 Family history of malignant neoplasm of breast: Secondary | ICD-10-CM | POA: Diagnosis not present

## 2023-12-12 DIAGNOSIS — Z825 Family history of asthma and other chronic lower respiratory diseases: Secondary | ICD-10-CM | POA: Diagnosis not present

## 2023-12-12 DIAGNOSIS — E66812 Obesity, class 2: Secondary | ICD-10-CM | POA: Diagnosis not present

## 2023-12-12 DIAGNOSIS — Z7982 Long term (current) use of aspirin: Secondary | ICD-10-CM | POA: Diagnosis not present

## 2023-12-12 DIAGNOSIS — R569 Unspecified convulsions: Secondary | ICD-10-CM | POA: Diagnosis not present

## 2023-12-12 DIAGNOSIS — E785 Hyperlipidemia, unspecified: Secondary | ICD-10-CM | POA: Diagnosis not present

## 2023-12-12 DIAGNOSIS — Z833 Family history of diabetes mellitus: Secondary | ICD-10-CM | POA: Diagnosis not present

## 2023-12-12 DIAGNOSIS — M1711 Unilateral primary osteoarthritis, right knee: Secondary | ICD-10-CM | POA: Diagnosis not present

## 2023-12-12 DIAGNOSIS — Z6838 Body mass index (BMI) 38.0-38.9, adult: Secondary | ICD-10-CM | POA: Diagnosis not present

## 2023-12-12 DIAGNOSIS — Z9049 Acquired absence of other specified parts of digestive tract: Secondary | ICD-10-CM | POA: Diagnosis not present

## 2023-12-12 DIAGNOSIS — Z7902 Long term (current) use of antithrombotics/antiplatelets: Secondary | ICD-10-CM | POA: Diagnosis not present

## 2023-12-12 LAB — CBC
HCT: 39 % (ref 36.0–46.0)
Hemoglobin: 12.7 g/dL (ref 12.0–15.0)
MCH: 29.2 pg (ref 26.0–34.0)
MCHC: 32.6 g/dL (ref 30.0–36.0)
MCV: 89.7 fL (ref 80.0–100.0)
Platelets: 200 10*3/uL (ref 150–400)
RBC: 4.35 MIL/uL (ref 3.87–5.11)
RDW: 14.3 % (ref 11.5–15.5)
WBC: 4.5 10*3/uL (ref 4.0–10.5)
nRBC: 0 % (ref 0.0–0.2)

## 2023-12-12 LAB — BASIC METABOLIC PANEL WITH GFR
Anion gap: 7 (ref 5–15)
BUN: 10 mg/dL (ref 6–20)
CO2: 25 mmol/L (ref 22–32)
Calcium: 8.6 mg/dL — ABNORMAL LOW (ref 8.9–10.3)
Chloride: 106 mmol/L (ref 98–111)
Creatinine, Ser: 0.98 mg/dL (ref 0.44–1.00)
GFR, Estimated: >60 mL/min (ref >60)
Glucose, Bld: 82 mg/dL (ref 70–99)
Potassium: 4.1 mmol/L (ref 3.5–5.1)
Sodium: 138 mmol/L (ref 135–145)

## 2023-12-12 LAB — GLUCOSE, CAPILLARY: Glucose-Capillary: 124 mg/dL — ABNORMAL HIGH (ref 70–99)

## 2023-12-12 LAB — HIV ANTIBODY (ROUTINE TESTING W REFLEX): HIV Screen 4th Generation wRfx: NONREACTIVE

## 2023-12-12 MED ORDER — IOHEXOL 350 MG/ML SOLN
75.0000 mL | Freq: Once | INTRAVENOUS | Status: AC | PRN
  Administered 2023-12-12: 75 mL via INTRAVENOUS

## 2023-12-12 MED ORDER — PREGABALIN 100 MG PO CAPS
100.0000 mg | ORAL_CAPSULE | Freq: Two times a day (BID) | ORAL | Status: DC
  Administered 2023-12-12 – 2023-12-13 (×3): 100 mg via ORAL
  Filled 2023-12-12 (×3): qty 1

## 2023-12-12 NOTE — Progress Notes (Signed)
 NEUROLOGY CONSULT FOLLOW UP NOTE   Date of service: Dec 12, 2023 Patient Name: Desiree Russell MRN:  [id number] DOB:  1970-11-13  Interval Hx/subjective  Seen and examined on the floors this morning.  Hooked up to LTM.  No events overnight.  Initially my exam was unremarkable but then around 8:10 AM, got Russell call that she got up to go to the bathroom and had episode of unresponsiveness.  EEG at the time had continuation of whole brain activity and she was also somewhat bradycardic at the time  Vitals   Vitals:   12/11/23 1855 12/11/23 1944 12/12/23 0003 12/12/23 0329  BP: 126/69 116/64 111/77 118/66  Pulse: 100 88 90 75  Resp: 18 18 17 18   Temp: 97.7 F (36.5 C) 98 F (36.7 C) (!) 97.4 F (36.3 C) 97.6 F (36.4 C)  TempSrc: Oral Oral Oral Oral  SpO2: 100% 100% 100% 97%  Weight:      Height:         Body mass index is 38.79 kg/m.  Physical Exam   GENERAL: Awake, alert in NAD HEENT: - Normocephalic and atraumatic, dry mm, no LN++, no Thyromegally LUNGS - Clear to auscultation bilaterally with no wheezes CV - S1S2 RRR, no m/r/g, equal pulses bilaterally. ABDOMEN - Soft, nontender, nondistended with normoactive BS NEURO:  Mental Status: AA&Ox3 Speech and Language: speech is nondysarthric.  Naming, repetition, fluency, and comprehension intact. Cranial Nerves: PERRL. EOMI, visual fields full, no facial asymmetry, facial sensation intact, hearing intact, tongue/uvula/soft palate midline, normal sternocleidomastoid and trapezius muscle strength. No evidence of tongue atrophy or fibrillations Motor: Antigravity in all fours without drift Tone: is normal and bulk is normal Sensation- Intact to light touch bilaterally Coordination: FTN intact bilaterally, no ataxia in BLE. Gait- deferred   Medications  Current Facility-Administered Medications:    acetaminophen  (TYLENOL ) tablet 650 mg, 650 mg, Oral, Q6H PRN, 650 mg at 12/11/23 1619 **OR** acetaminophen  (TYLENOL ) suppository 650  mg, 650 mg, Rectal, Q6H PRN, Desiree Russell, Desiree A, MD   albuterol  (PROVENTIL ) (2.5 MG/3ML) 0.083% nebulizer solution 2.5 mg, 2.5 mg, Nebulization, Q6H PRN, Desiree Russell, Desiree A, MD   aspirin  EC tablet 81 mg, 81 mg, Oral, Daily, Desiree Russell, Desiree A, MD   clopidogrel  (PLAVIX ) tablet 75 mg, 75 mg, Oral, Daily, Desiree Russell, Desiree A, MD   enoxaparin  (LOVENOX ) injection 40 mg, 40 mg, Subcutaneous, Q24H, Desiree Russell, Desiree A, MD, 40 mg at 12/11/23 2051   Fezolinetant  TABS 45 mg, 1 tablet, Oral, Daily, Desiree Russell, Desiree A, MD   FLUoxetine  (PROZAC ) capsule 20 mg, 20 mg, Oral, QPM, Desiree Russell, Desiree A, MD   metoprolol  succinate (TOPROL -XL) 24 hr tablet 50 mg, 50 mg, Oral, QHS, Desiree Russell, Desiree A, MD, 50 mg at 12/11/23 2052   OLANZapine  (ZYPREXA ) tablet 5 mg, 5 mg, Oral, QPM, Desiree Russell, Desiree A, MD, 5 mg at 12/11/23 2051   ondansetron  (ZOFRAN ) tablet 4 mg, 4 mg, Oral, Q6H PRN **OR** ondansetron  (ZOFRAN ) injection 4 mg, 4 mg, Intravenous, Q6H PRN, Desiree Russell, Desiree A, MD   oxyCODONE  (Oxy IR/ROXICODONE ) immediate release tablet 10 mg, 10 mg, Oral, QID PRN, Desiree Russell, Desiree A, MD   pantoprazole  (PROTONIX ) EC tablet 40 mg, 40 mg, Oral, Daily, Desiree Russell, Desiree A, MD, 40 mg at 12/11/23 2051   sodium chloride  flush (NS) 0.9 % injection 3 mL, 3 mL, Intravenous, Q12H, Desiree Russell, Desiree A, MD, 3 mL at 12/11/23 2054   vortioxetine  HBr (TRINTELLIX ) tablet 20 mg, 20 mg, Oral, Daily, Desiree Qualia, MD  Labs and Diagnostic Imaging  CBC:  Recent Labs  Lab 12/11/23 1109 12/12/23 0627  WBC 5.2 4.5  NEUTROABS 2.7  --   HGB 12.7  14.3 12.7  HCT 38.2  42.0 39.0  MCV 91.4 89.7  PLT 172 200    Basic Metabolic Panel:  Lab Results  Component Value Date   NA 139 12/11/2023   NA 139 12/11/2023   K 3.6 12/11/2023   K 3.7 12/11/2023   CO2 27 12/11/2023   GLUCOSE 100 (H) 12/11/2023   GLUCOSE 107 (H) 12/11/2023   BUN 15 12/11/2023   BUN 13 12/11/2023   CREATININE 1.10 (H) 12/11/2023   CREATININE 1.13 (H) 12/11/2023   CALCIUM  9.0 12/11/2023    GFRNONAA 59 (L) 12/11/2023   GFRAA >60 12/12/2014   Lipid Panel:  Lab Results  Component Value Date   LDLCALC 78 08/16/2022   HgbA1c:  Lab Results  Component Value Date   HGBA1C 5.5 07/14/2021   Urine Drug Screen:     Component Value Date/Time   LABOPIA NONE DETECTED 12/11/2023 1109   COCAINSCRNUR NONE DETECTED 12/11/2023 1109   LABBENZ POSITIVE (Russell) 12/11/2023 1109   AMPHETMU NONE DETECTED 12/11/2023 1109   THCU NONE DETECTED 12/11/2023 1109   LABBARB NONE DETECTED 12/11/2023 1109    Alcohol Level     Component Value Date/Time   ETH <15 12/11/2023 1109   INR  Lab Results  Component Value Date   INR 1.1 12/11/2023   APTT  Lab Results  Component Value Date   APTT 28 12/11/2023   CT Head without contrast(Personally reviewed): Unremarkable  MRI Brain(Personally reviewed): No acute abnormality.  Multiple remote infarcts in the cerebellum, similar to prior.  Remote lacunar infarct in the right thalamus.  Small remote cortical infarct in the right frontal lobe.  Nonspecific white matter signal abnormality reflecting mild chronic microvascular ischemic changes.  rEEG:  Normal  Continuous EEG: Formal read pending-the episode of unresponsiveness was associated with continuation of brainwave activity and bradycardia.  May be beneficial to get further workup in terms of cardiac monitoring, echocardiogram and CT angiography head and neck to evaluate for causes of cerebral hypoperfusion  Assessment   Desiree Russell is Russell 53 y.o. female past history significant for fibromyalgia, headaches, seizure-like activity with question of PNES, brought in from her place of work where she had sudden onset of change in her mentation and staring concerning for seizures.  She was hooked up to LTM EEG because of stereotypic nature of these events.  CT head unremarkable.  MRI negative for acute process but did show chronic infarcts.  She had an episode that was captured that showed attenuation of  brain activity in general on the EEG along with bradycardia. Concern for cerebral hypoperfusion events causing stereotypic episodes rather than true seizures at this point.   Recommendations  Continue to monitor on LTM EEG 2D echo CTA head and neck Orthostatic vitals Continue telemetry Plan discussed with Dr. Illene Malm ______________________________________________________________________   Signed, Tona Francis, MD [facility]

## 2023-12-12 NOTE — Procedures (Signed)
 Patient Name: Desiree Russell  MRN: [id number]  Epilepsy Attending: Arleene Lack  Referring Physician/Provider: Imogene Mana, NP  Duration: 12/11/2023 1440 to 12/12/2023 1440   Patient history: 52yo female with staring spells. EEG to evaluate for seizure   Level of alertness: Awake, asleep   AEDs during EEG study: Ativan    Technical aspects: This EEG study was done with scalp electrodes positioned according to the 10-20 International system of electrode placement. Electrical activity was reviewed with band pass filter of 1-70Hz , sensitivity of 7 uV/mm, display speed of 20mm/sec with a 60Hz  notched filter applied as appropriate. EEG data were recorded continuously and digitally stored.  Video monitoring was available and reviewed as appropriate.   Description: The posterior dominant rhythm consists of 9-10 Hz activity of moderate voltage (25-35 uV) seen predominantly in posterior head regions, symmetric and reactive to eye opening and eye closing. Sleep was characterized by vertex waves, sleep spindles (12 to 14 Hz), maximal frontocentral region.    One event was recorded on 12/12/2023 at around 0758.  Patient had just gotten out of bed and going to the bathroom.  On the way back she had an episode of unresponsiveness.  Concomitant EEG showed generalized background attenuation which gradually improved after around 4 minutes.  There was artifact in EKG leads.   EKG artifact was seen during the study.   IMPRESSION: This study is within normal limits. No seizures or epileptiform discharges were seen throughout the recording.   One event was recorded on 12/12/2023 at around 0758 as described above. Concomitant EEG showed generalized background attenuation which gradually improved after around 4 minutes. This EEG pattern is concerning for cerebral hypoperfusion.    Amalia Edgecombe O Alwilda Gilland

## 2023-12-12 NOTE — Progress Notes (Signed)
 Pt and her husband made aware of home med-veozah  not being available here at hospital pharmacy and would need to be brought from home to be administered here at hospital.

## 2023-12-12 NOTE — Progress Notes (Signed)
 LTM maint complete - no skin breakdown under:   T7,01, P7

## 2023-12-12 NOTE — Plan of Care (Signed)
   Problem: Education: Goal: Knowledge of General Education information will improve Description: Including pain rating scale, medication(s)/side effects and non-pharmacologic comfort measures Outcome: Progressing   Problem: Clinical Measurements: Goal: Ability to maintain clinical measurements within normal limits will improve Outcome: Progressing

## 2023-12-12 NOTE — Hospital Course (Addendum)
 52 yof w/ HTN HLD, prior CVA, anxiety, depression, PTSD, seizure versus PNES, and fibromyalgia who presented after being noted to have staring off spells by coworkers.Per review of records patient had multiple episodes of staring off in space with tears rolling down her face starting at 9:45 AM.  She is followed in outpatient setting by Dr. Lugene Sahara at [facility], but reports she has not seen him in quite some time.  She was previously prescribed an anti-seizure medication, Depakote, but did not take it.  Patient notes having 4 prior EEG studies returning inconclusive results for which currently she does not want to stay in the hospital. En route with EMS patient was noted to have right sided weakness. Seen in the ED as a code stroke vitals stable labs nearly at baseline  CT scan, and MRI of the brain no acute findings-showed multiple remote infarct in the cerebellum, seen by neurology and admitted overnight for LTM EEG. UDS positive for benzodiazepine.   She was significantly agitated while in the ED and was given Ativan  for MRI.  Seen by neurology and placed on LTM EEG-due to some EEG finding concerning for possible cerebral hypoperfusion- Underwent CT angio head and neck> no LVO, chronically diminutive vertebrobasilar system will multiple small chronic cerebellar infarct basal segment at least in part related to her fetal type bilateral PCA origin not definitely progressing.  Submucosal stenosis since 2022 CTA. CTA ok. TTE- done. And wnl Discussed w/ neuro ok for dc home  Subjective: Seen and examined Overnight afebrile BP stable on room air Labs reviewed from 5/15 stable No new complaints.  Discharge diagnoses:  Seizure-like activity Right-sided weakness History of CVA: Neurology following, workup unremarkable for acute stroke. On LTM EEG-patient had events on the EEG  5/15 am w/ concomitant EEG showed generalized background attenuation which gradually improved after around 4 minutes EEG pattern is  concerning for cerebral hypoperfusion>  so underwent CT angio head and neck> no LVO, chronically diminutive vertebrobasilar system will multiple small chronic cerebellar infarct basal segment at least in part related to her fetal type bilateral PCA origin not definitely progressing.  Submucosal stenosis since 2022 CTA. TTE- LVEF: 55-60%. No RWMA,RV Sys fun normal. LTM EEG 5/15 normal limits. ?PNES. She had an episode of staring again on 5/16- discussed w/ neuro- no further work up as LTM has been negative. Await final neurology recommendation for discharge disposition/ seizure precautions  iwht driving restriction for 6 months Of note she has been under a lot of stress as she is trying to lose weight for her knee surgery but has not been able to Discussed w/ neuro ok to return to work in 1 wk- advised to follow up with pcp/neuro clinic for further recommendations  Essential hypertension: Well-controlled.  Continue metoprolol   Anxiety/depression PTSD: Continue Prozac , olanzapine , and Trintellix  Per husband recently she is in a lot of stress and trying to lose her weight for knee surgery   Fibromyalgia Continue oxycodone   lyrics - hold for sedation    GERD Continue Protonix    Obesity Class 2: Advised weight loss healthy lifestyle   Right knee osteoarthritis: Being evaluated by orthopedics for TKR PER husband

## 2023-12-12 NOTE — Progress Notes (Signed)
 Echocardiogram 2D Echocardiogram has been performed.  Desiree Russell 12/12/2023, 6:48 PM

## 2023-12-12 NOTE — Significant Event (Signed)
 Rapid Response Event Note   Reason for Call :  Concern for seizure activity  Patient was up to bathroom. Upon exiting the restroom she stopped walking, stopped responding to staff. They assisted her to a seated position and then multiple staff members had to place her back in bed.  No tremor or shaking noted by staff. Eyes were not examined during event. Patient was connected to EEG and event was marked.  Initial Focused Assessment:  Patient in bed. Eyes closed. Eyes midline and down. Pt's head rotated left and right by myself and eyes remained midline and down. PERRLA, 4mm. Breathing regular, unlabored, SpO2 100% on room air. Clear breath sounds. No response to noxious stimuli, however patient did start waking up after performing nailbed pressure.   Upon waking, patient would track RN with her eyes. Not speaking. She did begin trying to get out of bed. Patient assisted back to laying position and educated to remain in bed.  VS: T 97.44F, BP 141/86, HR 83, RR 14, SpO2 99% on room air CBG: 124  Interventions:  CBG  Plan of Care:  Neurology to review EEG Continue current treatment plan  Event Summary:  MD Notified: Dr. Bobbetta Burnet at bedside Call Time: 0805 Arrival Time: 0808 End Time: 0815  Washington Hacker, RN

## 2023-12-12 NOTE — Progress Notes (Addendum)
 LTM maint complete - no skin breakdown seen  Pt refused leads behind ears, even with skin saver application.  Has lengthy braids , the leads are bundled however the leads easy pulled /removed by patient.  A2 was applied by tech with skin saver. A1 was not able to be applied, patient family member at bedside also advocated for the patient with refusal of reapplication of leads. [facility] monitored, Event button test confirmed by [facility].

## 2023-12-12 NOTE — Progress Notes (Signed)
 PROGRESS NOTE Desiree Russell  GNF:[id number] DOB: December 30, 1970 DOA: 12/11/2023 PCP: Sirivol, Mamatha, MD  Brief Narrative/Hospital Course: 26 yof w/ HTN HLD, prior CVA, anxiety, depression, PTSD, seizure versus PNES, and fibromyalgia who presented after being noted to have staring off spells by coworkers.Per review of records patient had multiple episodes of staring off in space with tears rolling down her face starting at 9:45 AM.  She is followed in outpatient setting by Dr. Lugene Sahara at [facility], but reports she has not seen him in quite some time.  She was previously prescribed an anti-seizure medication, Depakote, but did not take it.  Patient notes having 4 prior EEG studies returning inconclusive results for which currently she does not want to stay in the hospital. En route with EMS patient was noted to have right sided weakness. Seen in the ED as a code stroke vitals stable labs nearly at baseline  CT scan, and MRI of the brain no acute findings-showed multiple remote infarct in the cerebellum, seen by neurology and admitted overnight for LTM EEG. UDS positive for benzodiazepine.   She was significantly agitated while in the ED and was given Ativan  for MRI.   Subjective: Seen this morning with rapid response after she became unresponsive On arrival she has more alert awake did not grab my badge to read the name Otherwise not much responding, moving all 4 extremities spontaneously  Assessment and plan: Seizure-like activity Right-sided weakness History of CVA: Neurology following, workup unremarkable for acute stroke. On LTM EEG-had event this morning concomitant EEG showed generalized background attenuation which gradually improved after around 4 minutes EEG pattern is concerning for cerebral hypoperfusion. Getting CTA head and neck, echocardiogram for complete workup Continue seizure precaution, fall precaution.  Continue plan per neurology. Of note she has been under a lot of stress as she is  trying to lose weight for her knee surgery but has not been able to  Essential hypertension: Well-controlled.  Continue metoprolol   Anxiety/depression PTSD: Continue Prozac , olanzapine , and Trintellix  Per husband recently she is in a lot of stress and trying to lose her weight for knee surgery   Fibromyalgia Continue oxycodone   lyrics - hold for sedation    GERD Continue Protonix    Obesity Class 2: Advised weight loss healthy lifestyle   Right knee osteoarthritis: Being evaluated by orthopedics for TKR PER husband   Class II Obesity w/ Body mass index is 38.79 kg/m.: Will benefit with PCP follow-up, weight loss,healthy lifestyle and outpatient sleep eval if not done.  DVT prophylaxis: enoxaparin  (LOVENOX ) injection 40 mg Start: 12/11/23 2200 Code Status:   Code Status: Full Code Family Communication: plan of care discussed with patient/husband at bedside. Patient status is: Remains hospitalized because of severity of illness Level of care: Telemetry Medical   Dispo: The patient is from: home            Anticipated disposition: TBD Objective: Vitals last 24 hrs: Vitals:   12/12/23 0003 12/12/23 0329 12/12/23 0803 12/12/23 1209  BP: 111/77 118/66 (!) 141/86 110/71  Pulse: 90 75 83 77  Resp: 17 18    Temp: (!) 97.4 F (36.3 C) 97.6 F (36.4 C) 97.7 F (36.5 C) 98.2 F (36.8 C)  TempSrc: Oral Oral Oral Oral  SpO2: 100% 97% 99% 98%  Weight:      Height:        Physical Examination: General exam: alert awake HEENT:Oral mucosa moist, Ear/Nose WNL grossly Respiratory system: Bilaterally clear BS, no use of accessory muscle Cardiovascular system:  S1 & S2 +. Gastrointestinal system: Abdomen soft,  NT,ND,BS+ Nervous System: Able to move all extremities Extremities: LE edema neg, warm extremities Skin: No rashes,warm. MSK: Normal muscle bulk/tone.   Data Reviewed: I have personally reviewed following labs and imaging studies ( see epic result tab) CBC: Recent Labs   Lab 12/11/23 1109 12/12/23 0627  WBC 5.2 4.5  NEUTROABS 2.7  --   HGB 12.7  14.3 12.7  HCT 38.2  42.0 39.0  MCV 91.4 89.7  PLT 172 200   CMP: Recent Labs  Lab 12/11/23 1109 12/12/23 0627  NA 139  139 138  K 3.7  3.6 4.1  CL 104  99 106  CO2 27 25  GLUCOSE 107*  100* 82  BUN 13  15 10   CREATININE 1.13*  1.10* 0.98  CALCIUM  9.0 8.6*   GFR: Estimated Creatinine Clearance: 85.9 mL/min (by C-G formula based on SCr of 0.98 mg/dL). Recent Labs  Lab 12/11/23 1109  AST 22  ALT 18  ALKPHOS 33*  BILITOT 0.3  PROT 6.5  ALBUMIN 3.6   No results for input(s): "LIPASE", "AMYLASE" in the last 168 hours. No results for input(s): "AMMONIA" in the last 168 hours. Coagulation Profile:  Recent Labs  Lab 12/11/23 1109  INR 1.1   Unresulted Labs (From admission, onward)    None      Antimicrobials/Microbiology: Anti-infectives (From admission, onward)    None         Component Value Date/Time   SDES  12/24/2022 1322    SYNOVIAL KNEE Performed at [facility], [address] [place], [place] [address]    [facility]  12/24/2022 1322    NONE Performed at [facility], [address] [place], [place] [address]    CULT  12/24/2022 1322    NO GROWTH 3 DAYS Performed at [facility], [address], [place], [place] [address]    REPTSTATUS 12/27/2022 FINAL 12/24/2022 1322    Medications reviewed:  Scheduled Meds:  aspirin  EC  81 mg Oral Daily   clopidogrel   75 mg Oral Daily   enoxaparin  (LOVENOX ) injection  40 mg Subcutaneous Q24H   Fezolinetant   1 tablet Oral Daily   FLUoxetine   20 mg Oral QPM   metoprolol  succinate  50 mg Oral QHS   OLANZapine   5 mg Oral QPM   pantoprazole   40 mg Oral Daily   pregabalin   100 mg Oral BID   sodium chloride  flush  3 mL Intravenous Q12H   vortioxetine  HBr  20 mg Oral Daily   Continuous Infusions:  Lesa Rape, MD [facility] 12/12/2023, 12:31 PM

## 2023-12-13 ENCOUNTER — Other Ambulatory Visit (HOSPITAL_COMMUNITY): Payer: Self-pay

## 2023-12-13 ENCOUNTER — Inpatient Hospital Stay (HOSPITAL_COMMUNITY)

## 2023-12-13 DIAGNOSIS — R569 Unspecified convulsions: Secondary | ICD-10-CM | POA: Diagnosis not present

## 2023-12-13 LAB — ECHOCARDIOGRAM COMPLETE
AR max vel: 2.03 cm2
AV Peak grad: 6.2 mmHg
Ao pk vel: 1.24 m/s
Area-P 1/2: 3.31 cm2
Height: 66 in
S' Lateral: 3 cm
Weight: 3844.82 [oz_av]

## 2023-12-13 MED ORDER — NABUMETONE 750 MG PO TABS
750.0000 mg | ORAL_TABLET | Freq: Two times a day (BID) | ORAL | 1 refills | Status: AC
  Filled 2023-12-13: qty 60, 30d supply, fill #0
  Filled 2024-01-22: qty 60, 30d supply, fill #1

## 2023-12-13 NOTE — Procedures (Addendum)
 Patient Name: Desiree Russell  MRN: [id number]  Epilepsy Attending: Arleene Lack  Referring Physician/Provider: Imogene Mana, NP  Duration: 12/12/2023 1440 to 12/13/2023 0945   Patient history: 53yo female with staring spells. EEG to evaluate for seizure   Level of alertness: Awake, asleep   AEDs during EEG study: None   Technical aspects: This EEG study was done with scalp electrodes positioned according to the 10-20 International system of electrode placement. Electrical activity was reviewed with band pass filter of 1-70Hz , sensitivity of 7 uV/mm, display speed of 43mm/sec with a 60Hz  notched filter applied as appropriate. EEG data were recorded continuously and digitally stored.  Video monitoring was available and reviewed as appropriate.   Description: The posterior dominant rhythm consists of 9-10 Hz activity of moderate voltage (25-35 uV) seen predominantly in posterior head regions, symmetric and reactive to eye opening and eye closing. Sleep was characterized by vertex waves, sleep spindles (12 to 14 Hz), maximal frontocentral region.    EKG artifact was seen during the study.    IMPRESSION: This study is within normal limits. No seizures or epileptiform discharges were seen throughout the recording.   Mirza Kidney O Kingjames Coury

## 2023-12-13 NOTE — TOC Transition Note (Signed)
 Transition of Care [facility]) - Discharge Note   Patient Details  Name: Desiree Russell MRN: [id number] Date of Birth: 1971-06-26  [facility] CM/SW Contact:  Tom-Johnson, Angelique Ken, RN Phone Number: 12/13/2023, 3:18 PM   Clinical Narrative:     Patient is scheduled for discharge today.  Readmission Risk Assessment done. Outpatient f/u, hospital f/u and discharge instructions on AVS. No TOC needs or recommendations noted. Husband at bedside and will transport at discharge.  No further TOC needs noted.       Final next level of care: Home/Self Care Barriers to Discharge: Barriers Resolved   Patient Goals and CMS Choice Patient states their goals for this hospitalization and ongoing recovery are:: To return home. CMS Medicare.gov Compare Post Acute Care list provided to:: Patient Choice offered to / list presented to : NA      Discharge Placement                Patient to be transferred to facility by: Husband Name of family member notified: [facility]    Discharge Plan and Services Additional resources added to the After Visit Summary for                  DME Arranged: N/A DME Agency: NA       HH Arranged: NA HH Agency: NA        Social Drivers of Health (SDOH) Interventions SDOH Screenings   Food Insecurity: No Food Insecurity (12/11/2023)  Housing: Low Risk  (12/11/2023)  Transportation Needs: No Transportation Needs (12/11/2023)  Utilities: Not At Risk (12/11/2023)  Alcohol Screen: Low Risk  (11/04/2023)  Depression (PHQ2-9): Low Risk  (11/04/2023)  Financial Resource Strain: Low Risk  (11/04/2023)  Physical Activity: Sufficiently Active (11/04/2023)  Social Connections: Moderately Integrated (11/04/2023)  Stress: No Stress Concern Present (11/04/2023)  Tobacco Use: Low Risk  (12/12/2023)  Health Literacy: Adequate Health Literacy (11/04/2023)     Readmission Risk Interventions    12/13/2023    3:17 PM  Readmission Risk Prevention Plan  Post  Dischage Appt Complete  Medication Screening Complete  Transportation Screening Complete

## 2023-12-13 NOTE — Progress Notes (Signed)
 DISCHARGE NOTE HOME Desiree Russell to be discharged Home per MD order. Discussed prescriptions and follow up appointments with the patient. Prescriptions given to patient; medication list explained in detail. Patient verbalized understanding.  Skin clean, dry and intact without evidence of skin break down, no evidence of skin tears noted. IV catheter discontinued intact. Site without signs and symptoms of complications. Dressing and pressure applied. Pt denies pain at the site currently. No complaints noted.  Patient free of lines, drains, and wounds.   An After Visit Summary (AVS) was printed and given to the patient. Patient escorted via wheelchair, and discharged home via private auto.  [name], RN

## 2023-12-13 NOTE — Discharge Summary (Addendum)
 Physician Discharge Summary  Ineshia Walbridge YNW:[id number] DOB: [birth year] DOA: 12/11/2023  PCP: Sirivol, Mamatha, MD  Admit date: 12/11/2023 Discharge date: 12/13/2023 Recommendations for Outpatient Follow-up:  Follow up with PCP in 1 weeks-call for appointment Please obtain BMP/CBC in one week Follow-up with [facility] neurology  Discharge Dispo: HOME Discharge Condition: Stable Code Status:   Code Status: Full Code Diet recommendation:  Diet Order             Diet regular Fluid consistency: Thin  Diet effective now                    Brief/Interim Summary: 53 yof w/ HTN HLD, prior CVA, anxiety, depression, PTSD, seizure versus PNES, and fibromyalgia who presented after being noted to have staring off spells by coworkers.Per review of records patient had multiple episodes of staring off in space with tears rolling down her face starting at 9:45 AM.  She is followed in outpatient setting by Dr. Lugene Sahara at [facility], but reports she has not seen him in quite some time.  She was previously prescribed an anti-seizure medication, Depakote, but did not take it.  Patient notes having 4 prior EEG studies returning inconclusive results for which currently she does not want to stay in the hospital. En route with EMS patient was noted to have right sided weakness. Seen in the ED as a code stroke vitals stable labs nearly at baseline  CT scan, and MRI of the brain no acute findings-showed multiple remote infarct in the cerebellum, seen by neurology and admitted overnight for LTM EEG. UDS positive for benzodiazepine.   She was significantly agitated while in the ED and was given Ativan  for MRI.  Seen by neurology and placed on LTM EEG-due to some EEG finding concerning for possible cerebral hypoperfusion- Underwent CT angio head and neck> no LVO, chronically diminutive vertebrobasilar system will multiple small chronic cerebellar infarct basal segment at least in part related to her fetal type bilateral PCA  origin not definitely progressing.  Submucosal stenosis since 2022 CTA. CTA ok. TTE- done. And wnl Discussed w/ neuro ok for dc home  Subjective: Seen and examined Overnight afebrile BP stable on room air Labs reviewed from 5/15 stable No new complaints.  Discharge diagnoses:  Seizure-like activity Right-sided weakness History of CVA: Neurology following, workup unremarkable for acute stroke. On LTM EEG-patient had events on the EEG  5/15 am w/ concomitant EEG showed generalized background attenuation which gradually improved after around 4 minutes EEG pattern is concerning for cerebral hypoperfusion>  so underwent CT angio head and neck> no LVO, chronically diminutive vertebrobasilar system will multiple small chronic cerebellar infarct basal segment at least in part related to her fetal type bilateral PCA origin not definitely progressing.  Submucosal stenosis since 2022 CTA. TTE- LVEF: 55-60%. No RWMA,RV Sys fun normal. LTM EEG 5/15 normal limits. ?PNES. She had an episode of staring again on 5/16- discussed w/ neuro- no further work up as LTM has been negative. Await final neurology recommendation for discharge disposition/ seizure precautions  iwht driving restriction for 6 months Of note she has been under a lot of stress as she is trying to lose weight for her knee surgery but has not been able to Discussed w/ neuro ok to return to work in 1 wk- advised to follow up with pcp/neuro clinic for further recommendations  Essential hypertension: Well-controlled.  Continue metoprolol   Anxiety/depression PTSD: Continue Prozac , olanzapine , and Trintellix  Per husband recently she is in a lot of stress  and trying to lose her weight for knee surgery   Fibromyalgia Continue oxycodone   lyrics - hold for sedation    GERD Continue Protonix    Obesity Class 2: Advised weight loss healthy lifestyle   Right knee osteoarthritis: Being evaluated by orthopedics for TKR PER husband   I  personally informed patient and her husband that she cannot drive as per [place] DMV statuTES Until they have been seizure-free for 6 months and cleared by physician  Patient is to follow-up with outpatient neurology number provided  Seizure precautions: Per [place]  DMV statutes, patients with seizures are not allowed to drive until they have been seizure-free for six months and cleared by a physician    Use caution when using heavy equipment or power tools. Avoid working on ladders or at heights. Take showers instead of baths. Ensure the water temperature is not too high on the home water heater. Do not go swimming alone. Do not lock yourself in a room alone (i.e. bathroom). When caring for infants or small children, sit down when holding, feeding, or changing them to minimize risk of injury to the child in the event you have a seizure. Maintain good sleep hygiene. Avoid alcohol.    If patient has another seizure, call 911 and bring them back to the ED if: A.  The seizure lasts longer than 5 minutes.      B.  The patient doesn't wake shortly after the seizure or has new problems such as difficulty seeing, speaking or moving following the seizure C.  The patient was injured during the seizure D.  The patient has a temperature over 102 F (39C) E.  The patient vomited during the seizure and now is having trouble breathing    During the Seizure   - First, ensure adequate ventilation and place patients on the floor on their left side  Loosen clothing around the neck and ensure the airway is patent. If the patient is clenching the teeth, do not force the mouth open with any object as this can cause severe damage - Remove all items from the surrounding that can be hazardous. The patient may be oblivious to what's happening and may not even know what he or she is doing. If the patient is confused and wandering, either gently guide him/her away and block access to outside areas - Reassure the  individual and be comforting - Call 911. In most cases, the seizure ends before EMS arrives. However, there are cases when seizures may last over 3 to 5 minutes. Or the individual may have developed breathing difficulties or severe injuries. If a pregnant patient or a person with diabetes develops a seizure, it is prudent to call an ambulance. - Finally, if the patient does not regain full consciousness, then call EMS. Most patients will remain confused for about 45 to 90 minutes after a seizure, so you must use judgment in calling for help.    After the Seizure (Postictal Stage)   After a seizure, most patients experience confusion, fatigue, muscle pain and/or a headache. Thus, one should permit the individual to sleep. For the next few days, reassurance is essential. Being calm and helping reorient the person is also of importance.   Most seizures are painless and end spontaneously. Seizures are not harmful to others but can lead to complications such as stress on the lungs, brain and the heart. Individuals with prior lung problems may develop labored breathing and respiratory distress.  Discharge Exam: Vitals:   12/13/23  1102 12/13/23 1222  BP: (!) 141/84 114/78  Pulse: 81 68  Resp:  17  Temp: 98.3 F (36.8 C) 98.4 F (36.9 C)  SpO2: 99% 97%   General: Pt is alert, awake, not in acute distress Cardiovascular: RRR, S1/S2 +, no rubs, no gallops Respiratory: CTA bilaterally, no wheezing, no rhonchi Abdominal: Soft, NT, ND, bowel sounds + Extremities: no edema, no cyanosis  Discharge Instructions  Discharge Instructions     Discharge instructions   Complete by: As directed    Please call call MD or return to ER for similar or worsening recurring problem that brought you to hospital or if any fever,nausea/vomiting,abdominal pain, uncontrolled pain, chest pain,  shortness of breath or any other alarming symptoms.  Please follow-up your doctor as instructed in a week time and call the  office for appointment.  Please avoid alcohol, smoking, or any other illicit substance and maintain healthy habits including taking your regular medications as prescribed.  You were cared for by a hospitalist during your hospital stay. If you have any questions about your discharge medications or the care you received while you were in the hospital after you are discharged, you can call the unit and ask to speak with the hospitalist on call if the hospitalist that took care of you is not available.  Once you are discharged, your primary care physician will handle any further medical issues. Please note that NO REFILLS for any discharge medications will be authorized once you are discharged, as it is imperative that you return to your primary care physician (or establish a relationship with a primary care physician if you do not have one) for your aftercare needs so that they can reassess your need for medications and monitor your lab values   Seizure precautions: Per [place]  DMV statutes, patients with seizures are not allowed to drive until they have been seizure-free for six months and cleared by a physician    Use caution when using heavy equipment or power tools. Avoid working on ladders or at heights. Take showers instead of baths. Ensure the water temperature is not too high on the home water heater. Do not go swimming alone. Do not lock yourself in a room alone (i.e. bathroom). When caring for infants or small children, sit down when holding, feeding, or changing them to minimize risk of injury to the child in the event you have a seizure. Maintain good sleep hygiene. Avoid alcohol.    If patient has another seizure, call 911 and bring them back to the ED if: A.  The seizure lasts longer than 5 minutes.      B.  The patient doesn't wake shortly after the seizure or has new problems such as difficulty seeing, speaking or moving following the seizure C.  The patient was injured during the  seizure D.  The patient has a temperature over 102 F (39C) E.  The patient vomited during the seizure and now is having trouble breathing    During the Seizure   - First, ensure adequate ventilation and place patients on the floor on their left side  Loosen clothing around the neck and ensure the airway is patent. If the patient is clenching the teeth, do not force the mouth open with any object as this can cause severe damage - Remove all items from the surrounding that can be hazardous. The patient may be oblivious to what's happening and may not even know what he or she is doing. If the  patient is confused and wandering, either gently guide him/her away and block access to outside areas - Reassure the individual and be comforting - Call 911. In most cases, the seizure ends before EMS arrives. However, there are cases when seizures may last over 3 to 5 minutes. Or the individual may have developed breathing difficulties or severe injuries. If a pregnant patient or a person with diabetes develops a seizure, it is prudent to call an ambulance. - Finally, if the patient does not regain full consciousness, then call EMS. Most patients will remain confused for about 45 to 90 minutes after a seizure, so you must use judgment in calling for help.    After the Seizure (Postictal Stage)   After a seizure, most patients experience confusion, fatigue, muscle pain and/or a headache. Thus, one should permit the individual to sleep. For the next few days, reassurance is essential. Being calm and helping reorient the person is also of importance.   Most seizures are painless and end spontaneously. Seizures are not harmful to others but can lead to complications such as stress on the lungs, brain and the heart. Individuals with prior lung problems may develop labored breathing and respiratory distress.    Increase activity slowly   Complete by: As directed       Allergies as of 12/13/2023       Reactions    Codeine Hives, Rash, Other (See Comments)   Other reaction(s): Other (see comments), Hives, Hives , Hives, Hives , , Hives , Hives , Hives , Hives, Hives , Other reaction(s): Other (see comments), Hives, Other reaction(s): Other (see comments), Hives   Morphine  Hives, Other (See Comments)   Headaches, also        Medication List     TAKE these medications    acetaminophen  325 MG tablet Commonly known as: TYLENOL  Take 650 mg by mouth every 6 (six) hours as needed for mild pain, fever or headache.   aspirin  EC 81 MG tablet Take 1 tablet (81 mg total) by mouth daily.   Calcium -Vitamin D -Vitamin K 650-12.5-40 MG-MCG-MCG Chew Chew 1 tablet by mouth daily.   cholecalciferol 25 MCG (1000 UNIT) tablet Commonly known as: VITAMIN D3 Take 1,000 Units by mouth daily.   clopidogrel  75 MG tablet Commonly known as: PLAVIX  Take 1 tablet (75 mg total) by mouth once daily   docusate sodium  100 MG capsule Commonly known as: COLACE Take 100 mg by mouth 2 (two) times daily as needed for mild constipation or moderate constipation.   Emgality  120 MG/ML Soaj Generic drug: Galcanezumab -gnlm Inject 120 mg into the skin every 30 (thirty) days.   FLUoxetine  20 MG capsule Commonly known as: PROZAC  Take 1 capsule (20 mg total) by mouth every evening.   furosemide  20 MG tablet Commonly known as: LASIX  Take 1 tablet by mouth daily as needed. What changed: reasons to take this   lactobacillus acidophilus Tabs tablet Take 2 tablets by mouth daily at 12 noon.   methylPREDNISolone  4 MG Tbpk tablet Commonly known as: MEDROL  DOSEPAK Take as directed on package   metoprolol  succinate 50 MG 24 hr tablet Commonly known as: Toprol  XL Take 1 tablet (50 mg total) by mouth at bedtime.   multivitamin with minerals Tabs tablet Take 1 tablet by mouth daily.   nabumetone 750 MG tablet Commonly known as: RELAFEN Take 1 tablet (750 mg total) by mouth 2 (two) times daily.   OLANZapine  5 MG  tablet Commonly known as: ZyPREXA  Take 1 tablet (5  mg total) by mouth every evening.   OMEGA-3 CF PO Take 1 capsule by mouth daily at 12 noon.   Oxycodone  HCl 10 MG Tabs Take 1 tablet (10 mg total) by mouth 4 (four) times daily as needed. What changed: reasons to take this   pantoprazole  40 MG tablet Commonly known as: PROTONIX  Take 1 tablet by mouth daily.   pregabalin  150 MG capsule Commonly known as: Lyrica  Take 1 capsule (150 mg total) by mouth 2 (two) times daily. What changed:  how much to take when to take this   Trintellix  20 MG Tabs tablet Generic drug: vortioxetine  HBr Take 1 tablet (20 mg total) by mouth daily.   Ubrelvy  100 MG Tabs Generic drug: Ubrogepant  TAKE  1/2 TABLET BY MOUTH AT HEADACHE ONSET. MAY REPEAT DOSE IN TWO HOURS IF NO IMPROVEMENT. DO NOT EXCEED MORE THAN TWO TABLETS IN 24 HOURS. What changed:  how much to take how to take this when to take this reasons to take this additional instructions   valACYclovir  500 MG tablet Commonly known as: Valtrex  Take 1 tablet (500 mg total) by mouth 2 (two) times daily for 5 days What changed:  when to take this reasons to take this   Veozah  45 MG Tabs Generic drug: Fezolinetant  Take 1 tablet (45 mg total) by mouth daily.        Follow-up Information     Sirivol, Mamatha, MD Follow up in 1 week(s).   Specialty: Family Medicine Contact information: [address] [place] [address] [phone]         Jhonny Moss, MD Follow up in 1 week(s).   Specialty: Neurology Contact information: [address] [place] [address] [phone]                Allergies  Allergen Reactions   Codeine Hives, Rash and Other (See Comments)    Other reaction(s): Other (see comments), Hives, Hives , Hives, Hives , , Hives , Hives , Hives , Hives, Hives , Other reaction(s): Other (see comments), Hives, Other reaction(s): Other (see comments), Hives   Morphine  Hives and  Other (See Comments)    Headaches, also    The results of significant diagnostics from this hospitalization (including imaging, microbiology, ancillary and laboratory) are listed below for reference.    Microbiology: No results found for this or any previous visit (from the past 240 hours).  Procedures/Studies: ECHOCARDIOGRAM COMPLETE Result Date: 12/13/2023    ECHOCARDIOGRAM REPORT   Patient Name:   SHULANDA SCHLOEMER Date of Exam: 12/12/2023 Medical Rec #:  [id number]    Height:       66.0 in Accession #:    [id number]   Weight:       240.3 lb Date of Birth:  October 08, 1970   BSA:          2.162 m Patient Age:    52 years     BP:           110/71 mmHg Patient Gender: F            HR:           72 bpm. Exam Location:  Inpatient Procedure: 2D Echo, Cardiac Doppler and Color Doppler (Both Spectral and Color            Flow Doppler were utilized during procedure). Indications:    Syncope R55  History:        Patient has prior history of Echocardiogram examinations,  most                 recent 05/08/2021. Stroke, Migraine and TIA,                 Arrythmias:Tachycardia, Signs/Symptoms:Chest Pain and Syncope;                 Risk Factors:Hypertension and Dyslipidemia.  Sonographer:    Terrilee Few RCS Referring Phys: [id number] ASHISH ARORA IMPRESSIONS  1. Left ventricular ejection fraction, by estimation, is 55 to 60%. The left ventricle has normal function. The left ventricle has no regional wall motion abnormalities. Left ventricular diastolic parameters were normal.  2. Right ventricular systolic function is normal. The right ventricular size is normal. Tricuspid regurgitation signal is inadequate for assessing PA pressure.  3. The mitral valve is normal in structure. No evidence of mitral valve regurgitation. No evidence of mitral stenosis.  4. The aortic valve is tricuspid. Aortic valve regurgitation is not visualized. No aortic stenosis is present.  5. The inferior vena cava is normal in size with greater than  50% respiratory variability, suggesting right atrial pressure of 3 mmHg.  6. A small pericardial effusion is present. The pericardial effusion is localized near the right ventricle. FINDINGS  Left Ventricle: Left ventricular ejection fraction, by estimation, is 55 to 60%. The left ventricle has normal function. The left ventricle has no regional wall motion abnormalities. The left ventricular internal cavity size was normal in size. There is  no left ventricular hypertrophy. Left ventricular diastolic parameters were normal. Right Ventricle: The right ventricular size is normal. No increase in right ventricular wall thickness. Right ventricular systolic function is normal. Tricuspid regurgitation signal is inadequate for assessing PA pressure. Left Atrium: Left atrial size was normal in size. Right Atrium: Right atrial size was normal in size. Pericardium: A small pericardial effusion is present. The pericardial effusion is localized near the right ventricle. Mitral Valve: The mitral valve is normal in structure. No evidence of mitral valve regurgitation. No evidence of mitral valve stenosis. Tricuspid Valve: The tricuspid valve is normal in structure. Tricuspid valve regurgitation is mild. Aortic Valve: The aortic valve is tricuspid. Aortic valve regurgitation is not visualized. No aortic stenosis is present. Aortic valve peak gradient measures 6.2 mmHg. Pulmonic Valve: The pulmonic valve was normal in structure. Pulmonic valve regurgitation is not visualized. Aorta: The aortic root is normal in size and structure. Venous: The inferior vena cava is normal in size with greater than 50% respiratory variability, suggesting right atrial pressure of 3 mmHg. IAS/Shunts: No atrial level shunt detected by color flow Doppler.  LEFT VENTRICLE PLAX 2D LVIDd:         4.40 cm   Diastology LVIDs:         3.00 cm   LV e' medial:    7.51 cm/s LV PW:         1.10 cm   LV E/e' medial:  9.6 LV IVS:        1.00 cm   LV e' lateral:    12.20 cm/s LVOT diam:     1.90 cm   LV E/e' lateral: 5.9 LV SV:         46 LV SV Index:   22 LVOT Area:     2.84 cm  RIGHT VENTRICLE             IVC RV S prime:     13.80 cm/s  IVC diam: 1.30 cm LEFT ATRIUM  Index        RIGHT ATRIUM          Index LA diam:        2.80 cm 1.30 cm/m   RA Area:     9.35 cm LA Vol (A2C):   20.0 ml 9.25 ml/m   RA Volume:   14.00 ml 6.48 ml/m LA Vol (A4C):   49.3 ml 22.80 ml/m LA Biplane Vol: 34.7 ml 16.05 ml/m  AORTIC VALVE AV Area (Vmax): 2.03 cm AV Vmax:        124.00 cm/s AV Peak Grad:   6.2 mmHg LVOT Vmax:      88.60 cm/s LVOT Vmean:     57.100 cm/s LVOT VTI:       0.164 m  AORTA Ao Root diam: 3.00 cm Ao Asc diam:  3.20 cm MITRAL VALVE MV Area (PHT): 3.31 cm    SHUNTS MV Decel Time: 229 msec    Systemic VTI:  0.16 m MV E velocity: 71.80 cm/s  Systemic Diam: 1.90 cm MV A velocity: 54.00 cm/s MV E/A ratio:  1.33 Dalton McleanMD Electronically signed by Archer Bear Signature Date/Time: 12/13/2023/1:53:22 PM    Final    CT ANGIO HEAD NECK W WO CM Result Date: 12/12/2023 CLINICAL DATA:  53 year old female with multiple seizures. Neurologic deficit. Prior cerebellar infarcts. EXAM: CT ANGIOGRAPHY HEAD AND NECK WITH AND WITHOUT CONTRAST TECHNIQUE: Multidetector CT imaging of the head and neck was performed using the standard protocol during bolus administration of intravenous contrast. Multiplanar CT image reconstructions and MIPs were obtained to evaluate the vascular anatomy. Carotid stenosis measurements (when applicable) are obtained utilizing NASCET criteria, using the distal internal carotid diameter as the denominator. RADIATION DOSE REDUCTION: This exam was performed according to the departmental dose-optimization program which includes automated exposure control, adjustment of the mA and/or kV according to patient size and/or use of iterative reconstruction technique. CONTRAST:  75mL OMNIPAQUE  IOHEXOL  350 MG/ML SOLN COMPARISON:  Brain MRI yesterday.  Head  CT yesterday. CTA head and neck 04/11/2021. FINDINGS: CT HEAD Brain: Stable non contrast CT appearance of the brain. Multiple small cerebellar infarcts better demonstrated on MRI. No intracranial hemorrhage or mass effect. Calvarium and skull base: Intact, negative. Paranasal sinuses: Visualized paranasal sinuses and mastoids are stable and well aerated. Orbits: EEG scalp electrodes now in place. Otherwise negative orbit and scalp soft tissues. CTA NECK Skeleton: Lower cervical spine disc and endplate degeneration. Congenital incomplete ossification of the posterior C1 ring (normal variant). No acute osseous abnormality identified. Upper chest: Negative. Other neck: Negative. Aortic arch: 3 vessel arch. Mild cardiac pulsation. No arch atherosclerosis. Right carotid system: Mild pulsation and/or motion artifact at the thoracic inlet. Otherwise negative. Left carotid system: Negative aside from mild tortuosity of the left ICA distal to the bulb. Vertebral arteries: CTA portion repeated at 1237 hours, with better visualization of the vertebral arteries on that series. Proximal right subclavian artery and right vertebral artery origin are normal. Right vertebral artery is diminutive but patent to the skull base with no atherosclerosis or stenosis identified. Proximal left subclavian artery and left vertebral artery origin are within normal limits. Diminutive left vertebral artery, fenestrated in the V2 segment (normal variant) patent to the skull base with no significant plaque or stenosis. And both vertebral arteries appear stable since 2022. CTA HEAD Posterior circulation: Patent although diminutive distal vertebral arteries, vertebrobasilar junction and basilar artery. Dominant right V4 segment again noted, especially distal to the left PICA. Both PICA origins remain patent.  No convincing basilar stenosis. SCA and fetal type PCA origins remain patent. Bilateral PCA branches are within normal limits. Anterior  circulation: Both ICA siphons are patent. No significant siphon plaque or stenosis. Normal posterior communicating artery origins. Patent carotid termini. Dominant right and diminutive left ACA A1 segments again noted. Normal anterior communicating artery. Bilateral ACA branches are within normal limits. Left MCA M1 segment, bi/trifurcation and left MCA branches are within normal limits. Right MCA M1 segment and bifurcation, right MCA branches are stable and within normal limits. Venous sinuses: Patent on the 01230 7 hours images. Anatomic variants: Diminutive vertebrobasilar system at least in part on the basis of fetal type PCA origins. Dominant right and diminutive left ACA A1 segments. Review of the MIP images confirms the above findings IMPRESSION: 1. Negative for large vessel occlusion, and no discrete atherosclerosis in the head or neck. 2. Chronically diminutive Vertebrobasilar system in this patient with multiple small chronic cerebellar infarcts, although the vessel size is at least in part related to fetal type bilateral PCA origins. No definite progressive posterior circulation stenosis since a 2022 CTA. 3. Stable non contrast CT appearance of the brain. Electronically Signed   By: Marlise Simpers M.D.   On: 12/12/2023 14:01   Overnight EEG with video Result Date: 12/12/2023 Arleene Lack, MD     12/13/2023  5:59 AM Patient Name: Tashyra Cipollone MRN: [id number] Epilepsy Attending: Arleene Lack Referring Physician/Provider: Imogene Mana, NP Duration: 12/11/2023 1440 to 12/12/2023 1440  Patient history: 52yo female with staring spells. EEG to evaluate for seizure  Level of alertness: Awake, asleep  AEDs during EEG study: Ativan   Technical aspects: This EEG study was done with scalp electrodes positioned according to the 10-20 International system of electrode placement. Electrical activity was reviewed with band pass filter of 1-70Hz , sensitivity of 7 uV/mm, display speed of 65mm/sec with a 60Hz  notched  filter applied as appropriate. EEG data were recorded continuously and digitally stored.  Video monitoring was available and reviewed as appropriate.  Description: The posterior dominant rhythm consists of 9-10 Hz activity of moderate voltage (25-35 uV) seen predominantly in posterior head regions, symmetric and reactive to eye opening and eye closing. Sleep was characterized by vertex waves, sleep spindles (12 to 14 Hz), maximal frontocentral region.  One event was recorded on 12/12/2023 at around 0758.  Patient had just gotten out of bed and going to the bathroom.  On the way back she had an episode of unresponsiveness.  Concomitant EEG showed generalized background attenuation which gradually improved after around 4 minutes.  There was artifact in EKG leads. EKG artifact was seen during the study. IMPRESSION: This study is within normal limits. No seizures or epileptiform discharges were seen throughout the recording.  One event was recorded on 12/12/2023 at around 0758 as described above. Concomitant EEG showed generalized background attenuation which gradually improved after around 4 minutes. This EEG pattern is concerning for cerebral hypoperfusion.  Arleene Lack   EEG adult Result Date: 12/11/2023 Arleene Lack, MD     12/11/2023  5:44 PM Patient Name: Heathe Monge MRN: [id number] Epilepsy Attending: Arleene Lack Referring Physician/Provider: Imogene Mana, NP Date: 12/11/2023 Duration: 23 mins Patient history: 52yo f with staring spells. EEG to evaluate for seizure Level of alertness: Awake, asleep AEDs during EEG study: Ativan  Technical aspects: This EEG study was done with scalp electrodes positioned according to the 10-20 International system of electrode placement. Electrical activity was reviewed with band pass filter of 1-70Hz ,  sensitivity of 7 uV/mm, display speed of 25mm/sec with a 60Hz  notched filter applied as appropriate. EEG data were recorded continuously and digitally stored.  Video  monitoring was available and reviewed as appropriate. Description: The posterior dominant rhythm consists of 9-10 Hz activity of moderate voltage (25-35 uV) seen predominantly in posterior head regions, symmetric and reactive to eye opening and eye closing. Sleep was characterized by vertex waves, sleep spindles (12 to 14 Hz), maximal frontocentral region.  Hyperventilation and photic stimulation were not performed.   EKG artifact was seen during the study. IMPRESSION: This study is within normal limits. No seizures or epileptiform discharges were seen throughout the recording. A normal interictal EEG does not exclude the diagnosis of epilepsy. Arleene Lack   MR BRAIN WO CONTRAST Result Date: 12/11/2023 CLINICAL DATA:  Neuro deficit, concern for stroke. History of absence seizures. Multiple seizures this morning, staring off into space. EXAM: MRI HEAD WITHOUT CONTRAST TECHNIQUE: Multiplanar, multiecho pulse sequences of the brain and surrounding structures were obtained without intravenous contrast. COMPARISON:  Earlier same day CT head.  MRI head 03/16/2022. FINDINGS: Brain: No acute infarct. No evidence of intracranial hemorrhage. Remote lacunar infarct in the right thalamus. Redemonstrated remote infarcts in the bilateral cerebellum, left greater than right. Focal gliosis within the right frontal cortex suggestive of remote cortical infarct. Scattered foci of T2/FLAIR hyperintensity in the periventricular and subcortical white matter similar to prior. No edema, mass effect, or midline shift. Normal appearance of midline structures. The basilar cisterns are patent. No extra-axial fluid collections. Ventricles: Normal size and configuration of the ventricles. Vascular: Skull base flow voids are visualized. Skull and upper cervical spine: Degenerative changes in the visualized cervical spine. Disc osteophyte complex at C3-4. The visualized calvarium is unremarkable. Sinuses/Orbits: Orbits are symmetric.  Paranasal sinuses are clear. Other: Mastoid air cells are clear. IMPRESSION: No acute intracranial abnormality. Multiple remote infarcts in the cerebellum, similar to prior. Remote lacunar infarct in the right thalamus. Small remote cortical infarct in the right frontal lobe. Nonspecific white matter signal abnormality likely reflecting mild chronic microvascular ischemic changes. Electronically Signed   By: Denny Flack M.D.   On: 12/11/2023 11:59   CT HEAD CODE STROKE WO CONTRAST Result Date: 12/11/2023 CLINICAL DATA:  Code stroke. Neuro deficit, concern for stroke, right-sided weakness and altered mental status. EXAM: CT HEAD WITHOUT CONTRAST TECHNIQUE: Contiguous axial images were obtained from the base of the skull through the vertex without intravenous contrast. RADIATION DOSE REDUCTION: This exam was performed according to the departmental dose-optimization program which includes automated exposure control, adjustment of the mA and/or kV according to patient size and/or use of iterative reconstruction technique. COMPARISON:  CT head 02/20/2023. FINDINGS: Brain: No acute intracranial hemorrhage. No CT evidence of acute infarct. No edema, mass effect, or midline shift. The basilar cisterns are patent. Redemonstrated chronic cerebellar infarcts. Ventricles: The ventricles are normal. Vascular: No hyperdense vessel or unexpected calcification. Skull: No acute or aggressive finding. Orbits: Orbits are symmetric. Sinuses: The visualized paranasal sinuses are clear. Other: Mastoid air cells are clear. ASPECTS [facility] Stroke Program Early CT Score) - Ganglionic level infarction (caudate, lentiform nuclei, internal capsule, insula, M1-M3 cortex): 7 - Supraganglionic infarction (M4-M6 cortex): 3 Total score (0-10 with 10 being normal): 10 IMPRESSION: 1. No CT evidence of acute intracranial abnormality. 2. Redemonstrated chronic cerebellar infarcts. 3. ASPECTS is 10 These results were communicated to Dr. Arora at  11:23 am on 12/11/2023 by text page via the [facility] messaging system. Electronically Signed  By: Denny Flack M.D.   On: 12/11/2023 11:23    Labs: BNP (last 3 results) No results for input(s): "BNP" in the last 8760 hours. Basic Metabolic Panel: Recent Labs  Lab 12/11/23 1109 12/12/23 0627  NA 139  139 138  K 3.7  3.6 4.1  CL 104  99 106  CO2 27 25  GLUCOSE 107*  100* 82  BUN 13  15 10   CREATININE 1.13*  1.10* 0.98  CALCIUM  9.0 8.6*   Liver Function Tests: Recent Labs  Lab 12/11/23 1109  AST 22  ALT 18  ALKPHOS 33*  BILITOT 0.3  PROT 6.5  ALBUMIN 3.6   No results for input(s): "LIPASE", "AMYLASE" in the last 168 hours. No results for input(s): "AMMONIA" in the last 168 hours. CBC: Recent Labs  Lab 12/11/23 1109 12/12/23 0627  WBC 5.2 4.5  NEUTROABS 2.7  --   HGB 12.7  14.3 12.7  HCT 38.2  42.0 39.0  MCV 91.4 89.7  PLT 172 200      Component Value Date/Time   COLORURINE YELLOW 03/03/2022 1003   APPEARANCEUR CLEAR 03/03/2022 1003   LABSPEC 1.011 03/03/2022 1003   PHURINE 8.0 03/03/2022 1003   GLUCOSEU NEGATIVE 03/03/2022 1003   HGBUR SMALL (A) 03/03/2022 1003   BILIRUBINUR negative 11/04/2023 1618   BILIRUBINUR neg 01/11/2021 0839   KETONESUR negative 11/04/2023 1618   KETONESUR NEGATIVE 03/03/2022 1003   PROTEINUR NEGATIVE 03/03/2022 1003   UROBILINOGEN 0.2 11/04/2023 1618   NITRITE Negative 11/04/2023 1618   NITRITE NEGATIVE 03/03/2022 1003   LEUKOCYTESUR Negative 11/04/2023 1618   LEUKOCYTESUR NEGATIVE 03/03/2022 1003   Sepsis Labs Recent Labs  Lab 12/11/23 1109 12/12/23 0627  WBC 5.2 4.5   Microbiology No results found for this or any previous visit (from the past 240 hours).   Time coordinating discharge: 35 minutes  SIGNED: Lesa Rape, MD  [facility] 12/13/2023, 4:38 PM  If 7PM-7AM, please contact night-coverage [email or web address]

## 2023-12-13 NOTE — Progress Notes (Signed)
 Patient had a episode of staring and not responding verbally to commands that lasted about 7 to 9 minutes. Patient vitals remains stable. She is confused coming out of this state. MD notified

## 2023-12-13 NOTE — Plan of Care (Signed)

## 2023-12-13 NOTE — Progress Notes (Addendum)
 NEUROLOGY CONSULT FOLLOW UP NOTE   Date of service: Dec 13, 2023 Patient Name: Desiree Russell MRN:  [id number] DOB:  10-May-1971  Interval Hx/subjective  Seen and examined.  No further episodes.  Patient seems to be at her baseline according to her husband at bedside  Vitals   Vitals:   12/12/23 1937 12/12/23 2354 12/13/23 0433 12/13/23 0801  BP: 110/84 (!) 103/56 (!) 110/58 122/77  Pulse: 83 72 62 74  Resp: 17   19  Temp:  97.9 F (36.6 C) 97.8 F (36.6 C) 97.8 F (36.6 C)  TempSrc:  Oral Oral Oral  SpO2: 98% 97% 98% 98%  Weight:      Height:         Body mass index is 38.79 kg/m.  Physical Exam   GENERAL: Awake, alert in NAD HEENT: - Normocephalic and atraumatic, dry mm, no LN++, no Thyromegally LUNGS - Clear to auscultation bilaterally with no wheezes CV - S1S2 RRR, no m/r/g, equal pulses bilaterally. ABDOMEN - Soft, nontender, nondistended with normoactive BS NEURO:  Mental Status: AA&Ox3 Speech and Language: speech is nondysarthric.  Naming, repetition, fluency, and comprehension intact. Cranial Nerves: PERRL. EOMI, visual fields full, no facial asymmetry, facial sensation intact, hearing intact, tongue/uvula/soft palate midline, normal sternocleidomastoid and trapezius muscle strength. No evidence of tongue atrophy or fibrillations Motor: Antigravity in all fours without drift Tone: is normal and bulk is normal Sensation- Intact to light touch bilaterally Coordination: FTN intact bilaterally, no ataxia in BLE. Gait- deferred   Medications  Current Facility-Administered Medications:    acetaminophen  (TYLENOL ) tablet 650 mg, 650 mg, Oral, Q6H PRN, 650 mg at 12/11/23 1619 **OR** acetaminophen  (TYLENOL ) suppository 650 mg, 650 mg, Rectal, Q6H PRN, Felipe Horton, Rondell A, MD   albuterol  (PROVENTIL ) (2.5 MG/3ML) 0.083% nebulizer solution 2.5 mg, 2.5 mg, Nebulization, Q6H PRN, Felipe Horton, Rondell A, MD   aspirin  EC tablet 81 mg, 81 mg, Oral, Daily, Felipe Horton, Rondell A, MD, 81 mg at  12/13/23 0847   clopidogrel  (PLAVIX ) tablet 75 mg, 75 mg, Oral, Daily, Felipe Horton, Rondell A, MD, 75 mg at 12/13/23 0847   enoxaparin  (LOVENOX ) injection 40 mg, 40 mg, Subcutaneous, Q24H, Smith, Rondell A, MD, 40 mg at 12/12/23 2059   Fezolinetant  TABS 45 mg, 1 tablet, Oral, Daily, Smith, Rondell A, MD   FLUoxetine  (PROZAC ) capsule 20 mg, 20 mg, Oral, QPM, Smith, Rondell A, MD, 20 mg at 12/12/23 1603   metoprolol  succinate (TOPROL -XL) 24 hr tablet 50 mg, 50 mg, Oral, QHS, Smith, Rondell A, MD, 50 mg at 12/12/23 2100   OLANZapine  (ZYPREXA ) tablet 5 mg, 5 mg, Oral, QPM, Smith, Rondell A, MD, 5 mg at 12/12/23 1603   ondansetron  (ZOFRAN ) tablet 4 mg, 4 mg, Oral, Q6H PRN **OR** ondansetron  (ZOFRAN ) injection 4 mg, 4 mg, Intravenous, Q6H PRN, Felipe Horton, Rondell A, MD   oxyCODONE  (Oxy IR/ROXICODONE ) immediate release tablet 10 mg, 10 mg, Oral, QID PRN, Felipe Horton, Rondell A, MD, 10 mg at 12/13/23 0847   pantoprazole  (PROTONIX ) EC tablet 40 mg, 40 mg, Oral, Daily, Smith, Rondell A, MD, 40 mg at 12/13/23 0847   pregabalin  (LYRICA ) capsule 100 mg, 100 mg, Oral, BID, Kc, Ramesh, MD, 100 mg at 12/13/23 0847   sodium chloride  flush (NS) 0.9 % injection 3 mL, 3 mL, Intravenous, Q12H, Smith, Rondell A, MD, 3 mL at 12/13/23 0853   vortioxetine  HBr (TRINTELLIX ) tablet 20 mg, 20 mg, Oral, Daily, Felipe Horton, Rondell A, MD, 20 mg at 12/13/23 0902  Labs and Diagnostic Imaging   CBC:  Recent Labs  Lab 12/11/23 1109 12/12/23 0627  WBC 5.2 4.5  NEUTROABS 2.7  --   HGB 12.7  14.3 12.7  HCT 38.2  42.0 39.0  MCV 91.4 89.7  PLT 172 200    Basic Metabolic Panel:  Lab Results  Component Value Date   NA 138 12/12/2023   K 4.1 12/12/2023   CO2 25 12/12/2023   GLUCOSE 82 12/12/2023   BUN 10 12/12/2023   CREATININE 0.98 12/12/2023   CALCIUM  8.6 (L) 12/12/2023   GFRNONAA >60 12/12/2023   GFRAA >60 12/12/2014   Lipid Panel:  Lab Results  Component Value Date   LDLCALC 78 08/16/2022   HgbA1c:  Lab Results  Component  Value Date   HGBA1C 5.5 07/14/2021   Urine Drug Screen:     Component Value Date/Time   LABOPIA NONE DETECTED 12/11/2023 1109   COCAINSCRNUR NONE DETECTED 12/11/2023 1109   LABBENZ POSITIVE (A) 12/11/2023 1109   AMPHETMU NONE DETECTED 12/11/2023 1109   THCU NONE DETECTED 12/11/2023 1109   LABBARB NONE DETECTED 12/11/2023 1109    Alcohol Level     Component Value Date/Time   ETH <15 12/11/2023 1109   INR  Lab Results  Component Value Date   INR 1.1 12/11/2023   APTT  Lab Results  Component Value Date   APTT 28 12/11/2023   CT Head without contrast(Personally reviewed): Unremarkable  MRI Brain(Personally reviewed): No acute abnormality.  Multiple remote infarcts in the cerebellum, similar to prior.  Remote lacunar infarct in the right thalamus.  Small remote cortical infarct in the right frontal lobe.  Nonspecific white matter signal abnormality reflecting mild chronic microvascular ischemic changes.  CT angiography head and neck: No acute changes.  Unchanged from 2022  rEEG:  Normal  Continuous EEG: 12/11/2023 to 12/12/2023 Formal read pending-the episode of unresponsiveness was associated with continuation of brainwave activity and bradycardia.  May be beneficial to get further workup in terms of cardiac monitoring, echocardiogram and CT angiography head and neck to evaluate for causes of cerebral hypoperfusion  12/12/2023 to 12/13/2023 Normal study.  Assessment   Desiree Russell is a 53 y.o. female past history significant for fibromyalgia, headaches, seizure-like activity with question of PNES, brought in from her place of work where she had sudden onset of change in her mentation and staring concerning for seizures.  She was hooked up to LTM EEG because of stereotypic nature of these events.  CT head unremarkable.  MRI negative for acute process but did show chronic infarcts.  She had an episode that was captured that showed attenuation of brain activity in general on the  EEG along with bradycardia. Concern for cerebral hypoperfusion events causing stereotypic episodes rather than true seizures at this point.   He diagnosis with 100% certainty still remains elusive at this point.  Recommendations  Continuous EEG can be discontinued. 2D echo-pending Pending Orthostatic vitals Continue telemetry If above tests are  unremarkable, can dc home .  Needs outpatient neurology follow-up.  Patient does not want to follow with [facility] neurology. Please provide her with referral to [facility] neurology-Dr. Ty Gales Plan discussed with Dr. Illene Malm ______________________________________________________________________   Signed, Tona Francis, MD [facility]    SEIZURE PRECAUTIONS Per [place] statutes, patients with seizures are not allowed to drive until they have been seizure-free for six months.   Use caution when using heavy equipment or power tools. Avoid working on ladders or at heights. Take showers instead of baths. Ensure the water temperature is  not too high on the home water heater. Do not go swimming alone. Do not lock yourself in a room alone (i.e. bathroom). When caring for infants or small children, sit down when holding, feeding, or changing them to minimize risk of injury to the child in the event you have a seizure. Maintain good sleep hygiene. Avoid alcohol.    If patient has another seizure, call 911 and bring them back to the ED if: A.  The seizure lasts longer than 5 minutes.      B.  The patient doesn't wake shortly after the seizure or has new problems such as difficulty seeing, speaking or moving following the seizure C.  The patient was injured during the seizure D.  The patient has a temperature over 102 F (39C) E.  The patient vomited during the seizure and now is having trouble breathing

## 2023-12-13 NOTE — Progress Notes (Signed)
 LTM VIDEO EEG discontinued - no skin breakdown at [facility]

## 2023-12-13 NOTE — Plan of Care (Signed)
   Problem: Coping: Goal: Level of anxiety will decrease Outcome: Progressing

## 2023-12-16 ENCOUNTER — Telehealth (HOSPITAL_COMMUNITY): Payer: 59 | Admitting: Psychiatry

## 2023-12-16 ENCOUNTER — Encounter (HOSPITAL_COMMUNITY): Payer: Self-pay | Admitting: Psychiatry

## 2023-12-16 ENCOUNTER — Telehealth: Payer: Self-pay

## 2023-12-16 ENCOUNTER — Other Ambulatory Visit (HOSPITAL_COMMUNITY): Payer: Self-pay

## 2023-12-16 DIAGNOSIS — F419 Anxiety disorder, unspecified: Secondary | ICD-10-CM | POA: Diagnosis not present

## 2023-12-16 DIAGNOSIS — F331 Major depressive disorder, recurrent, moderate: Secondary | ICD-10-CM

## 2023-12-16 DIAGNOSIS — F4310 Post-traumatic stress disorder, unspecified: Secondary | ICD-10-CM | POA: Diagnosis not present

## 2023-12-16 MED ORDER — FLUOXETINE HCL 20 MG PO CAPS
20.0000 mg | ORAL_CAPSULE | Freq: Every evening | ORAL | 0 refills | Status: DC
  Filled 2023-12-16: qty 90, 90d supply, fill #0

## 2023-12-16 MED ORDER — VORTIOXETINE HBR 20 MG PO TABS
20.0000 mg | ORAL_TABLET | Freq: Every day | ORAL | 0 refills | Status: DC
Start: 2023-12-16 — End: 2024-03-16
  Filled 2023-12-16 – 2024-01-22 (×2): qty 90, 90d supply, fill #0

## 2023-12-16 MED ORDER — OLANZAPINE 5 MG PO TABS
5.0000 mg | ORAL_TABLET | Freq: Every evening | ORAL | 0 refills | Status: DC
  Filled 2023-12-16: qty 90, 90d supply, fill #0

## 2023-12-16 NOTE — Transitions of Care (Post Inpatient/ED Visit) (Signed)
 12/16/2023  Name: Desiree Russell MRN: [id number] DOB: 05/13/1971  Today's TOC FU Call Status: Today's TOC FU Call Status:: Successful TOC FU Call Completed TOC FU Call Complete Date: 12/16/23 Patient's Name and Date of Birth confirmed.  Transition Care Management Follow-up Telephone Call Date of Discharge: 12/13/23 Discharge Facility: [facility]) Type of Discharge: Inpatient Admission Primary Inpatient Discharge Diagnosis:: convulsions How have you been since you were released from the hospital?: Better Any questions or concerns?: No  Items Reviewed: Medications obtained,verified, and reconciled?: Yes (Medications Reviewed) Any new allergies since your discharge?: No Dietary orders reviewed?: Yes Do you have support at home?: Yes People in Home [RPT]: spouse  Medications Reviewed Today: Medications Reviewed Today     Reviewed by Darrall Ellison, LPN (Licensed Practical Nurse) on 12/16/23 at 1047  Med List Status: <None>   Medication Order Taking? Sig Documenting Provider Last Dose Status Informant  acetaminophen  (TYLENOL ) 325 MG tablet [id number] No Take 650 mg by mouth every 6 (six) hours as needed for mild pain, fever or headache. [provider] Unknown Active Self, Pharmacy Records, Multiple Informants  aspirin  81 MG EC tablet [id number] No Take 1 tablet (81 mg total) by mouth daily. Mikhail, Maryann, DO 12/11/2023 Active Self, Pharmacy Records, Multiple Informants  Calcium -Vitamin D -Vitamin K 650-12.5-40 MG-MCG-MCG CHEW [id number] No Chew 1 tablet by mouth daily. [provider] 12/11/2023 Active Self, Pharmacy Records, Multiple Informants  cholecalciferol (VITAMIN D3) 25 MCG (1000 UNIT) tablet [id number] No Take 1,000 Units by mouth daily. [provider] 12/11/2023 Active Self, Pharmacy Records, Multiple Informants  clopidogrel  (PLAVIX ) 75 MG tablet [id number] No Take 1 tablet (75 mg total) by mouth once daily Sirivol, Mamatha, MD 12/11/2023  9:00 AM  Active Self, Pharmacy Records, Multiple Informants           Med Note Alline Ivans   Thu Dec 12, 2023 12:51 PM) Recent Dispenses  10/25/2023 75 MG TABS (disp 90, 90d supply) 06/05/2023 75 MG TABS (disp 90, 90d supply)     docusate sodium  (COLACE) 100 MG capsule [id number] No Take 100 mg by mouth 2 (two) times daily as needed for mild constipation or moderate constipation. [provider] Past Week Active Self, Pharmacy Records, Multiple Informants  Fezolinetant  (VEOZAH ) 45 MG TABS [id number] No Take 1 tablet (45 mg total) by mouth daily. Sirivol, Mamatha, MD 12/11/2023 Active Self, Pharmacy Records, Multiple Informants           Med Note Alline Ivans   Thu Dec 12, 2023 12:51 PM) Recent Dispenses 10/25/2023 45 MG TABS (disp 90, 90d supply) 07/29/2023 45 MG TABS (disp 90, 90d supply)    FLUoxetine  (PROZAC ) 20 MG capsule [id number] No Take 1 capsule (20 mg total) by mouth every evening. Arfeen, Syed T, MD 12/11/2023 Active Self, Pharmacy Records, Multiple Informants           Med Note Alline Ivans   Thu Dec 12, 2023 12:52 PM) Recent Dispenses 09/17/2023 20 MG CAPS (disp 90, 90d supply) 07/02/2023 20 MG CAPS (disp 90, 90d supply)    furosemide  (LASIX ) 20 MG tablet [id number] No Take 1 tablet by mouth daily as needed.  Patient taking differently: Take 20 mg by mouth daily as needed for fluid or edema.   Eilleen Grates, MD Past Week Active Self, Pharmacy Records, Multiple Informants           Med Note Alline Ivans   Thu Dec 12, 2023 12:52 PM) Recent Dispenses    10/25/2023 20 MG TABS (disp 90,  90d supply)    Galcanezumab -gnlm (EMGALITY ) 120 MG/ML SOAJ [id number] No Inject 120 mg into the skin every 30 (thirty) days. Sirivol, Mamatha, MD 11/22/2023 Active Self, Pharmacy Records, Multiple Informants           Med Note Alline Ivans   Thu Dec 12, 2023 12:53 PM) Recent Dispenses    09/17/2023 120 MG/ML SOAJ (disp 3, 90d supply) 06/21/2023 120  MG/ML SOAJ (disp 3, 90d supply)    lactobacillus acidophilus (BACID) TABS tablet [id number] No Take 2 tablets by mouth daily at 12 noon. [provider] 12/11/2023 Active Self, Pharmacy Records, Multiple Informants  methylPREDNISolone  (MEDROL  DOSEPAK) 4 MG TBPK tablet [id number] No Take as directed on package  Patient not taking: Reported on 12/12/2023   Jodi Munroe, NP Not Taking Active Self, Pharmacy Records, Multiple Informants           Med Note Alline Ivans   Thu Dec 12, 2023 12:53 PM) Recent Dispenses 11/29/2023 4 MG TBPK (disp 21, 6d supply)    metoprolol  succinate (TOPROL  XL) 50 MG 24 hr tablet [id number] No Take 1 tablet (50 mg total) by mouth at bedtime. Eilleen Grates, MD 12/11/2023 Active Self, Pharmacy Records, Multiple Informants           Med Note Alline Ivans   Thu Dec 12, 2023 12:53 PM) Recent Dispenses    11/29/2023 50 MG TB24 (disp 90, 90d supply) 08/22/2023 50 MG TB24 (disp 90, 90d supply)    Multiple Vitamin (MULTIVITAMIN WITH MINERALS) TABS tablet [id number] No Take 1 tablet by mouth daily. [provider] Past Week Active Self, Pharmacy Records, Multiple Informants  nabumetone  (RELAFEN ) 750 MG tablet [id number]  Take 1 tablet (750 mg total) by mouth 2 (two) times daily.   Active   OLANZapine  (ZYPREXA ) 5 MG tablet [id number] No Take 1 tablet (5 mg total) by mouth every evening. Arturo Late, MD 12/11/2023 Active Self, Pharmacy Records, Multiple Informants           Med Note Alline Ivans   Thu Dec 12, 2023 12:54 PM) Recent Dispenses 09/17/2023 5 MG TABS (disp 90, 90d supply) 07/02/2023 2.5 MG TABS (disp 90, 90d supply)    Omega-3 Fatty Acids (OMEGA-3 CF PO) [id number] No Take 1 capsule by mouth daily at 12 noon. [provider] Past Week Active Self, Pharmacy Records, Multiple Informants  Oxycodone  HCl 10 MG TABS [id number] No Take 1 tablet (10 mg total) by mouth 4 (four) times daily as needed.  Patient taking  differently: Take 10 mg by mouth 4 (four) times daily as needed (pain).   Jodi Munroe, NP 12/11/2023 Active Self, Pharmacy Records, Multiple Informants           Med Note Alline Ivans   Thu Dec 12, 2023 12:55 PM) Recent Dispenses    12/02/2023 10 MG TABS (disp 120, 30d supply)    pantoprazole  (PROTONIX ) 40 MG tablet [id number] No Take 1 tablet by mouth daily. Lajuan Pila, MD 12/11/2023 Active Self, Pharmacy Records, Multiple Informants           Med Note Alline Ivans   Thu Dec 12, 2023 12:55 PM)  Recent Dispenses     09/27/2023 40 MG TBEC (disp 90, 90d supply) 07/02/2023 40 MG TBEC (disp 90, 90d supply)     pregabalin  (LYRICA ) 150 MG capsule [id number] No Take 1 capsule (150 mg total) by mouth 2 (two) times daily.  Patient taking differently: Take 300 mg by mouth at bedtime.   Sirivol, Mamatha, MD  Past Week Active Self, Pharmacy Records, Multiple Informants           Med Note Alline Ivans   Thu Dec 12, 2023 12:55 PM) Recent Dispenses    10/25/2023 150 MG CAPS (disp 60, 30d supply) 09/17/2023 100 MG CAPS (disp 60, 30d supply) 07/29/2023 75 MG CAPS (disp 60, 30d supply)    Ubrogepant  (UBRELVY ) 100 MG TABS [id number] No TAKE  1/2 TABLET BY MOUTH AT HEADACHE ONSET. MAY REPEAT DOSE IN TWO HOURS IF NO IMPROVEMENT. DO NOT EXCEED MORE THAN TWO TABLETS IN 24 HOURS.  Patient taking differently: Take 100 mg by mouth 2 (two) times daily as needed (migraine). TAKE 1/2 TABLET BY MOUTH AT HEADACHE ONSET. MAY REPEAT DOSE IN TWO HOURS IF NO IMPROVEMENT. DO NOT EXCEED MORE THAN TWO TABLETS IN 24 HOURS.    Unknown Active Self, Pharmacy Records, Multiple Informants  valACYclovir  (VALTREX ) 500 MG tablet [id number] No Take 1 tablet (500 mg total) by mouth 2 (two) times daily for 5 days  Patient taking differently: Take 500 mg by mouth 2 (two) times daily as needed (Flare up).   Sirivol, Mamatha, MD Unknown Active Self, Pharmacy Records, Multiple Informants           Med  Note Alline Ivans   Thu Dec 12, 2023  1:09 PM) Chronic - PRN flare up   vortioxetine  HBr (TRINTELLIX ) 20 MG TABS tablet [id number] No Take 1 tablet (20 mg total) by mouth daily. Arturo Late, MD 12/11/2023 Morning Active Self, Pharmacy Records, Multiple Informants           Med Note Alline Ivans   Thu Dec 12, 2023 12:56 PM) Recent Dispenses 10/25/2023 20 MG TABS (disp 90, 90d supply) 07/29/2023 20 MG TABS (disp 90, 90d supply) 07/02/2023 20 MG TABS (disp 30, 30d supply)               Home Care and Equipment/Supplies: Were Home Health Services Ordered?: NA Any new equipment or medical supplies ordered?: NA  Functional Questionnaire: Do you need assistance with bathing/showering or dressing?: No Do you need assistance with meal preparation?: No Do you need assistance with eating?: No Do you have difficulty maintaining continence: No Do you need assistance with getting out of bed/getting out of a chair/moving?: No Do you have difficulty managing or taking your medications?: No  Follow up appointments reviewed: PCP Follow-up appointment confirmed?: Yes Date of PCP follow-up appointment?: 12/17/23 Follow-up Provider: [facility] Follow-up appointment confirmed?: No Reason Specialist Follow-Up Not Confirmed: [id number] [facility] Calling Clinician Notified Provider Practice of Needed Appointment Do you need transportation to your follow-up appointment?: No Do you understand care options if your condition(s) worsen?: Yes-patient verbalized understanding    SIGNATURE Darrall Ellison, LPN [facility] Nurse Health Advisor Direct Dial [phone]
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
## 2023-12-17 ENCOUNTER — Other Ambulatory Visit: Payer: Self-pay

## 2023-12-17 ENCOUNTER — Ambulatory Visit (INDEPENDENT_AMBULATORY_CARE_PROVIDER_SITE_OTHER)

## 2023-12-17 VITALS — BP 100/66 | HR 61 | Temp 97.6°F | Ht 66.0 in | Wt 238.2 lb

## 2023-12-17 DIAGNOSIS — G451 Carotid artery syndrome (hemispheric): Secondary | ICD-10-CM

## 2023-12-17 DIAGNOSIS — Z09 Encounter for follow-up examination after completed treatment for conditions other than malignant neoplasm: Secondary | ICD-10-CM | POA: Insufficient documentation

## 2023-12-17 DIAGNOSIS — I6330 Cerebral infarction due to thrombosis of unspecified cerebral artery: Secondary | ICD-10-CM | POA: Diagnosis not present

## 2023-12-17 DIAGNOSIS — R569 Unspecified convulsions: Secondary | ICD-10-CM | POA: Diagnosis not present

## 2023-12-17 NOTE — Assessment & Plan Note (Signed)
 Transition of care phone call done Doing well now  Referral to neurology placed  Follow up in 2 months

## 2023-12-17 NOTE — Patient Instructions (Signed)
  VISIT SUMMARY: During your visit, we discussed your recent seizure-like episode at work and reviewed your medical history, including past episodes and your stroke in 2019. We also talked about your current medications and their potential impact on your symptoms.  YOUR PLAN: SEIZURE-LIKE ACTIVITY: You experienced a seizure-like episode on Dec 11, 2023, with symptoms of staring, confusion, and difficulty speaking. Previous tests have not shown definitive seizure activity. -You are being referred to [facility] urgently for further evaluation. -Do not drive until you are cleared by a neurologist. -Avoid taking as-needed medications unless absolutely necessary.  CHRONIC CEREBELLAR INFARCTS: Your MRI showed multiple old strokes in the cerebellum, which may be contributing to your seizure-like episodes. -This will be further evaluated by the neurologist.  RIGHT-SIDED WEAKNESS: You have ongoing right-sided weakness from your stroke in 2019. -Continue with your current management plan for this condition.  DEPRESSION AND ANXIETY: You are currently taking Prozac , olanzapine , and Trintellix  for depression and anxiety, which are effective. -Continue taking your psychiatric medications as prescribed. -Follow up with your psychiatrist as needed.                      Contains text generated by [employer].                                 Contains text generated by [employer]bridge.

## 2023-12-17 NOTE — Progress Notes (Signed)
 Subjective:  Patient ID: Desiree Russell, female    DOB: 03-Nov-1970  Age: 53 y.o. MRN: [id number]  Chief Complaint  Patient presents with   Hospitalization Follow-up    HPI: Discussed the use of AI scribe software for clinical note transcription with the patient, who gave verbal consent to proceed.  History of Present Illness   Desiree Russell is a 53 year old female with a history of seizure-like episodes who presents for Hospital discharge follow up after recent seizure-like activity at work and was admitted from 12/11/23 till 12/13/23  On Dec 11, 2023, she experienced a seizure-like episode at work characterized by lightheadedness and confusion. Coworkers noted staring and difficulty speaking. EMS transported her to [facility] where stroke was ruled out, and EEG and imaging studies showed no abnormalities. She was discharged on Dec 13, 2023, with a diagnosis of seizure-like activity.  She has a history of similar episodes occurring both at work and at home. Her husband has observed these episodes, noting confusion and inability to speak. She recalls an episode while driving three to four years ago, experiencing facial numbness and speech difficulty, necessitating pulling over and calling for help. Multiple EEGs, including the most recent one last year, have not led to a definitive diagnosis.  She has a history of a stroke in 2019, resulting in persistent right-sided weakness. During her recent hospital stay, an MRI revealed multiple remote infarcts in the cerebellum and a chronically diminutive vertebrovascular system. No new medications were prescribed at discharge.  Her current medications include Prozac , olanzapine , Trintellix , and Lyrica . Her oxycodone  dosage was recently increased to four times daily, approximately one to two weeks before the recent episode. She has stopped taking Xanax . She also takes Veozah  for hot flashes, but she does not believe it is related to her episodes as  they occurred prior to starting this medication.  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 She occasionally drinks wine, about once or twice a month, and denies the use of marijuana or other substances.         12/17/2023    3:22 PM 11/04/2023    3:11 PM 10/17/2023    3:12 PM 09/26/2023    3:38 PM 09/12/2023   12:57 PM  Depression screen PHQ 2/9  Decreased Interest 0 0 0 0 0  Down, Depressed, Hopeless 0 0 0 0 0  PHQ - 2 Score 0 0 0 0 0  Altered sleeping  0     Tired, decreased energy  0     Change in appetite  0     Feeling bad or failure about yourself   0     Trouble concentrating  0     Moving slowly or fidgety/restless  0     Suicidal thoughts  0     PHQ-9 Score  0     Difficult doing work/chores  Not difficult at all           12/17/2023    3:22 PM  Fall Risk   Falls in the past year? 1  Number falls in past yr: 1  Injury with Fall? 0  Risk for fall due to : No Fall Risks    Patient Care Team: Chandria Rookstool, MD as PCP - General (Family Medicine) Eilleen Grates, MD as PCP - Cardiology (Cardiology)   Review of Systems  Constitutional:  Negative for chills, fatigue and fever.  HENT:  Negative for congestion, ear pain, sinus pressure and sore throat.  Respiratory:  Negative for cough and shortness of breath.   Cardiovascular:  Negative for chest pain.  Gastrointestinal:  Negative for abdominal pain, constipation, diarrhea, nausea and vomiting.  Genitourinary:  Negative for dysuria and frequency.  Musculoskeletal:  Positive for arthralgias. Negative for back pain and myalgias.  Neurological:  Negative for dizziness and headaches.       Recent seizure like activity  Psychiatric/Behavioral:  Positive for dysphoric mood. The patient is not nervous/anxious.     Current Outpatient Medications on File Prior to Visit  Medication Sig Dispense Refill   acetaminophen  (TYLENOL ) 325 MG tablet Take 650 mg by mouth every 6 (six) hours as needed for mild pain, fever or  headache.     aspirin  81 MG EC tablet Take 1 tablet (81 mg total) by mouth daily. 21 tablet 0   Calcium -Vitamin D -Vitamin K 650-12.5-40 MG-MCG-MCG CHEW Chew 1 tablet by mouth daily.     cholecalciferol (VITAMIN D3) 25 MCG (1000 UNIT) tablet Take 1,000 Units by mouth daily.     clopidogrel  (PLAVIX ) 75 MG tablet Take 1 tablet (75 mg total) by mouth once daily 90 tablet 0   docusate sodium  (COLACE) 100 MG capsule Take 100 mg by mouth 2 (two) times daily as needed for mild constipation or moderate constipation.     Fezolinetant  (VEOZAH ) 45 MG TABS Take 1 tablet (45 mg total) by mouth daily. 90 tablet 0   FLUoxetine  (PROZAC ) 20 MG capsule Take 1 capsule (20 mg total) by mouth every evening. 90 capsule 0   furosemide  (LASIX ) 20 MG tablet Take 1 tablet by mouth daily as needed. (Patient taking differently: Take 20 mg by mouth daily as needed for fluid or edema.) 90 tablet 3   Galcanezumab -gnlm (EMGALITY ) 120 MG/ML SOAJ Inject 120 mg into the skin every 30 (thirty) days. 3 mL 2   metoprolol  succinate (TOPROL  XL) 50 MG 24 hr tablet Take 1 tablet (50 mg total) by mouth at bedtime. 90 tablet 3   Multiple Vitamin (MULTIVITAMIN WITH MINERALS) TABS tablet Take 1 tablet by mouth daily.     nabumetone  (RELAFEN ) 750 MG tablet Take 1 tablet (750 mg total) by mouth 2 (two) times daily. 60 tablet 1   OLANZapine  (ZYPREXA ) 5 MG tablet Take 1 tablet (5 mg total) by mouth every evening. 90 tablet 0   Omega-3 Fatty Acids (OMEGA-3 CF PO) Take 1 capsule by mouth daily at 12 noon.     Oxycodone  HCl 10 MG TABS Take 1 tablet (10 mg total) by mouth 4 (four) times daily as needed. (Patient taking differently: Take 10 mg by mouth 4 (four) times daily as needed (pain).) 120 tablet 0   pantoprazole  (PROTONIX ) 40 MG tablet Take 1 tablet by mouth daily. 90 tablet 1   pregabalin  (LYRICA ) 150 MG capsule Take 1 capsule (150 mg total) by mouth 2 (two) times daily. (Patient taking differently: Take 300 mg by mouth at bedtime.) 60 capsule 2    Ubrogepant  (UBRELVY ) 100 MG TABS TAKE  1/2 TABLET BY MOUTH AT HEADACHE ONSET. MAY REPEAT DOSE IN TWO HOURS IF NO IMPROVEMENT. DO NOT EXCEED MORE THAN TWO TABLETS IN 24 HOURS. (Patient taking differently: Take 100 mg by mouth 2 (two) times daily as needed (migraine). TAKE 1/2 TABLET BY MOUTH AT HEADACHE ONSET. MAY REPEAT DOSE IN TWO HOURS IF NO IMPROVEMENT. DO NOT EXCEED MORE THAN TWO TABLETS IN 24 HOURS.) 18 tablet 1   valACYclovir  (VALTREX ) 500 MG tablet Take 1 tablet (500 mg total) by mouth  2 (two) times daily for 5 days (Patient taking differently: Take 500 mg by mouth 2 (two) times daily as needed (Flare up).) 10 tablet 1   vortioxetine  HBr (TRINTELLIX ) 20 MG TABS tablet Take 1 tablet (20 mg total) by mouth daily. 90 tablet 0   No current facility-administered medications on file prior to visit.   Past Medical History:  Diagnosis Date   Allergy    Anemia    Anxiety    Arthritis    Dyslipidemia    Fibromyalgia    GERD (gastroesophageal reflux disease)    Headache    Migraines    Pericarditis    Stroke (HCC) 12/2020   Tachycardia    TIA (transient ischemic attack)    Recurrent   Past Surgical History:  Procedure Laterality Date   ABDOMINAL HYSTERECTOMY     CHOLECYSTECTOMY     CHOLECYSTECTOMY, LAPAROSCOPIC     COLONOSCOPY     KNEE ARTHROSCOPY Right 01/02/2023   UPPER GASTROINTESTINAL ENDOSCOPY      Family History  Problem Relation Age of Onset   Hypertension Mother    Hypertension Father    Heart attack Father 55   Healthy Sister    Healthy Sister    Healthy Sister    Hypertension Brother    Diabetes type II Brother    COPD Brother    Healthy Brother    Healthy Brother    Breast cancer Maternal Aunt    Throat cancer Maternal Uncle    Hypertension Maternal Grandmother    Diabetes Paternal Surveyor, minerals    Healthy Daughter    Healthy Son    Healthy Son    Healthy Son    Colon cancer Neg Hx    Pancreatic cancer Neg Hx    Stomach cancer Neg Hx    Liver disease  Neg Hx    Esophageal cancer Neg Hx    Rectal cancer Neg Hx    Social History   Socioeconomic History   Marital status: Married    Spouse name: Not on file   Number of children: 4   Years of education: Not on file   Highest education level: Bachelor's degree (e.g., BA, AB, BS)  Occupational History   Not on file  Tobacco Use   Smoking status: Never   Smokeless tobacco: Never  Vaping Use   Vaping status: Never Used  Substance and Sexual Activity   Alcohol use: Yes    Alcohol/week: 1.0 standard drink of alcohol    Types: 1 Glasses of wine per week    Comment: occ    Drug use: No   Sexual activity: Yes    Partners: Male    Birth control/protection: Surgical  Other Topics Concern   Not on file  Social History Narrative   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     Social Drivers of Corporate investment banker Strain: Low Risk  (11/04/2023)   Overall Financial Resource Strain (CARDIA)    Difficulty of Paying Living Expenses: Not hard at all  Food Insecurity: No Food Insecurity (12/11/2023)   Hunger Vital Sign    Worried About Running Out of Food in the Last Year: Never true    Ran Out of Food in the Last Year: Never true  Transportation Needs: No Transportation Needs (12/11/2023)   PRAPARE - Administrator, Civil Service (Medical): No    Lack of Transportation (Non-Medical): No  Physical Activity: Sufficiently Active (11/04/2023)  Exercise Vital Sign    Days of Exercise per Week: 5 days    Minutes of Exercise per Session: 30 min  Stress: No Stress Concern Present (11/04/2023)   [name] of Occupational Health - Occupational Stress Questionnaire    Feeling of Stress : Only a little  Social Connections: Moderately Integrated (11/04/2023)   Social Connection and Isolation Panel [NHANES]    Frequency of Communication with Friends and Family: Three times a week    Frequency of Social Gatherings with Friends and Family: Once a week    Attends  Religious Services: More than 4 times per year    Active Member of [employer] or Organizations: No    Attends Engineer, structural: Not on file    Marital Status: Married    Objective:  BP 100/66   Pulse 61   Temp 97.6 F (36.4 C)   Ht 5\' 6"  (1.676 m)   Wt 238 lb 3.2 oz (108 kg)   SpO2 98%   BMI 38.45 kg/m      12/17/2023    3:18 PM 12/16/2023   11:29 AM 12/13/2023   12:22 PM  BP/Weight  Systolic BP 100  027  Diastolic BP 66  78  Wt. (Lbs) 238.2 240   BMI 38.45 kg/m2 38.74 kg/m2     Physical Exam Vitals and nursing note reviewed.  Constitutional:      Appearance: Normal appearance.  HENT:     Head: Normocephalic and atraumatic.  Cardiovascular:     Rate and Rhythm: Normal rate and regular rhythm.  Pulmonary:     Effort: Pulmonary effort is normal.     Breath sounds: Normal breath sounds.  Musculoskeletal:        General: Normal range of motion.  Neurological:     General: No focal deficit present.     Mental Status: She is alert and oriented to person, place, and time.  Psychiatric:        Mood and Affect: Mood normal.        Behavior: Behavior normal.     Diabetic Foot Exam - Simple   No data filed      Lab Results  Component Value Date   WBC 4.5 12/12/2023   HGB 12.7 12/12/2023   HCT 39.0 12/12/2023   PLT 200 12/12/2023   GLUCOSE 82 12/12/2023   CHOL 137 08/16/2022   TRIG 55 08/16/2022   HDL 47 08/16/2022   LDLCALC 78 08/16/2022   ALT 18 12/11/2023   AST 22 12/11/2023   NA 138 12/12/2023   K 4.1 12/12/2023   CL 106 12/12/2023   CREATININE 0.98 12/12/2023   BUN 10 12/12/2023   CO2 25 12/12/2023   TSH 0.450 08/16/2022   INR 1.1 12/11/2023   HGBA1C 5.5 07/14/2021      Assessment & Plan:  Seizure-like activity [facility]) Assessment & Plan: Seizure-like activity occurred on Dec 11, 2023, with symptoms of staring, confusion, and inability to speak. Previous episodes reported, including one while driving. EEG and imaging studies were  negative for acute seizure activity. MRI showed multiple remote cerebellar infarcts. Neurologist suggested possible involvement of small blood vessels in the cerebellum. Driving is restricted until neurologist clearance is obtained. No definitive diagnosis from previous neurologist consultations. - Refer to [facility] Neurology with urgent priority for further evaluation. - Advise against driving until cleared by a neurologist. - Instruct to avoid as-needed medications unless absolutely necessary.  Orders: -     Ambulatory referral to Neurology  Hemispheric carotid artery syndrome Assessment & Plan: On Aspirin  81 mg daily, Plavix  75 mg daily,  NOT ON A STATIN.  LDL 78 last blood work. On omega 3 fatty acid pills.  WILL MAKE RECOMMENDATION FOR STATIN to prevent future strokes.   Orders: -     Ambulatory referral to Neurology  Cerebral infarction due to thrombosis of cerebral artery [facility]) Assessment & Plan: HISTORY OF STROKE/ CVA.  Chronic cerebellar infarcts MRI revealed multiple remote cerebellar infarcts,. Neurologist indicated involvement of small blood vessels in the cerebellum, but the etiology of these seizure like episodes is unclear. Possible pseudoseizures.   Orders: -     Ambulatory referral to Neurology  Hospital discharge follow-up Assessment & Plan: Transition of care phone call done Doing well now  Referral to neurology placed  Follow up in 2 months      Assessment and Plan           No orders of the defined types were placed in this encounter.   Orders Placed This Encounter  Procedures   Ambulatory referral to Neurology     Follow-up: Return in about 8 weeks (around 02/11/2024) for chronic disease follow up.    An After Visit Summary was printed and given to the patient.  Halil Rentz, MD [facility] [phone]

## 2023-12-17 NOTE — Assessment & Plan Note (Signed)
 HISTORY OF STROKE/ CVA.  Chronic cerebellar infarcts MRI revealed multiple remote cerebellar infarcts,. Neurologist indicated involvement of small blood vessels in the cerebellum, but the etiology of these seizure like episodes is unclear. Possible pseudoseizures.

## 2023-12-17 NOTE — Assessment & Plan Note (Signed)
 Seizure-like activity occurred on Dec 11, 2023, with symptoms of staring, confusion, and inability to speak. Previous episodes reported, including one while driving. EEG and imaging studies were negative for acute seizure activity. MRI showed multiple remote cerebellar infarcts. Neurologist suggested possible involvement of small blood vessels in the cerebellum. Driving is restricted until neurologist clearance is obtained. No definitive diagnosis from previous neurologist consultations. - Refer to [facility] Neurology with urgent priority for further evaluation. - Advise against driving until cleared by a neurologist. - Instruct to avoid as-needed medications unless absolutely necessary.

## 2023-12-17 NOTE — Assessment & Plan Note (Signed)
 On Aspirin  81 mg daily, Plavix  75 mg daily,  NOT ON A STATIN.  LDL 78 last blood work. On omega 3 fatty acid pills.  WILL MAKE RECOMMENDATION FOR STATIN to prevent future strokes.

## 2023-12-18 ENCOUNTER — Telehealth: Payer: Self-pay

## 2023-12-18 NOTE — Telephone Encounter (Signed)
 FMLA Forms [employer]

## 2023-12-20 ENCOUNTER — Ambulatory Visit
Admission: RE | Admit: 2023-12-20 | Discharge: 2023-12-20 | Disposition: A | Discharge status: Home / Self Care | Source: Ambulatory Visit | Attending: Registered Nurse | Admitting: Registered Nurse

## 2023-12-20 ENCOUNTER — Other Ambulatory Visit (HOSPITAL_COMMUNITY): Payer: Self-pay

## 2023-12-20 DIAGNOSIS — M1711 Unilateral primary osteoarthritis, right knee: Secondary | ICD-10-CM | POA: Diagnosis not present

## 2023-12-20 DIAGNOSIS — Z9889 Other specified postprocedural states: Secondary | ICD-10-CM | POA: Diagnosis not present

## 2023-12-20 DIAGNOSIS — M549 Dorsalgia, unspecified: Secondary | ICD-10-CM | POA: Diagnosis not present

## 2023-12-22 ENCOUNTER — Other Ambulatory Visit: Payer: Self-pay

## 2023-12-23 MED ORDER — PREGABALIN 150 MG PO CAPS
300.0000 mg | ORAL_CAPSULE | Freq: Every day | ORAL | 0 refills | Status: DC
  Filled 2023-12-23 – 2024-01-22 (×2): qty 90, 45d supply, fill #0

## 2023-12-24 ENCOUNTER — Other Ambulatory Visit (HOSPITAL_COMMUNITY): Payer: Self-pay

## 2023-12-26 ENCOUNTER — Telehealth: Payer: Self-pay

## 2023-12-26 NOTE — Telephone Encounter (Signed)
 FMLA [employer]

## 2023-12-30 ENCOUNTER — Encounter: Payer: Self-pay | Admitting: Registered Nurse

## 2023-12-30 ENCOUNTER — Other Ambulatory Visit (HOSPITAL_COMMUNITY): Payer: Self-pay

## 2023-12-30 ENCOUNTER — Encounter: Attending: Physical Medicine & Rehabilitation | Admitting: Registered Nurse

## 2023-12-30 VITALS — BP 124/68 | HR 80 | Ht 66.0 in | Wt 242.0 lb

## 2023-12-30 DIAGNOSIS — M1711 Unilateral primary osteoarthritis, right knee: Secondary | ICD-10-CM | POA: Diagnosis not present

## 2023-12-30 DIAGNOSIS — G894 Chronic pain syndrome: Secondary | ICD-10-CM | POA: Insufficient documentation

## 2023-12-30 DIAGNOSIS — M545 Low back pain, unspecified: Secondary | ICD-10-CM | POA: Diagnosis not present

## 2023-12-30 DIAGNOSIS — Z79891 Long term (current) use of opiate analgesic: Secondary | ICD-10-CM | POA: Diagnosis not present

## 2023-12-30 DIAGNOSIS — Z5181 Encounter for therapeutic drug level monitoring: Secondary | ICD-10-CM | POA: Insufficient documentation

## 2023-12-30 DIAGNOSIS — G8929 Other chronic pain: Secondary | ICD-10-CM | POA: Diagnosis not present

## 2023-12-30 MED ORDER — OXYCODONE HCL 10 MG PO TABS
10.0000 mg | ORAL_TABLET | Freq: Four times a day (QID) | ORAL | 0 refills | Status: DC | PRN
  Filled 2023-12-30: qty 120, 30d supply, fill #0

## 2023-12-30 NOTE — Progress Notes (Signed)
 Subjective:    Patient ID: Desiree Russell, female    DOB: 10-15-1970, 53 y.o.   MRN: [id number]  HPI: Desiree Russell is a 53 y.o. female who returns for follow up appointment for chronic pain and medication refill. She states her pain is located in her lower back and right knee pain. She rates her pain 5/7. Her current exercise regime is walking and performing stretching exercises.  [name] was admitted to [facility] on 12/11/2023- 12/13/2023 Seizure like activity, discharge summary reviewed.   [name] Morphine  equivalent is 60.00 MME.   Oral Swab Performed today.    Pain Inventory Average Pain 5 Pain Right Now patient reports pain level 5 for back and 7 for right knee My pain is constant, aching, and throbbing  In the last 24 hours, has pain interfered with the following? General activity 8 Relation with others 8 Enjoyment of life 10 What TIME of day is your pain at its worst? evening and night Sleep (in general) Good  Pain is worse with: walking, bending, standing, and some activites Pain improves with: heat/ice, therapy/exercise, and medication Relief from Meds: 5  Family History  Problem Relation Age of Onset   Hypertension Mother    Hypertension Father    Heart attack Father 46   Healthy Sister    Healthy Sister    Healthy Sister    Hypertension Brother    Diabetes type II Brother    COPD Brother    Healthy Brother    Healthy Brother    Breast cancer Maternal Aunt    Throat cancer Maternal Uncle    Hypertension Maternal Grandmother    Diabetes Paternal Surveyor, minerals    Healthy Daughter    Healthy Son    Healthy Son    Healthy Son    Colon cancer Neg Hx    Pancreatic cancer Neg Hx    Stomach cancer Neg Hx    Liver disease Neg Hx    Esophageal cancer Neg Hx    Rectal cancer Neg Hx    Social History   Socioeconomic History   Marital status: Married    Spouse name: Not on file   Number of children: 4   Years of education: Not on file   Highest  education level: Bachelor's degree (e.g., BA, AB, BS)  Occupational History   Not on file  Tobacco Use   Smoking status: Never   Smokeless tobacco: Never  Vaping Use   Vaping status: Never Used  Substance and Sexual Activity   Alcohol use: Yes    Alcohol/week: 1.0 standard drink of alcohol    Types: 1 Glasses of wine per week    Comment: occ    Drug use: No   Sexual activity: Yes    Partners: Male    Birth control/protection: Surgical  Other Topics Concern   Not on file  Social History Narrative   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     Social Drivers of Corporate investment banker Strain: Low Risk  (11/04/2023)   Overall Financial Resource Strain (CARDIA)    Difficulty of Paying Living Expenses: Not hard at all  Food Insecurity: No Food Insecurity (12/11/2023)   Hunger Vital Sign    Worried About Running Out of Food in the Last Year: Never true    Ran Out of Food in the Last Year: Never true  Transportation Needs: No Transportation Needs (12/11/2023)   PRAPARE - Transportation  Lack of Transportation (Medical): No    Lack of Transportation (Non-Medical): No  Physical Activity: Sufficiently Active (11/04/2023)   Exercise Vital Sign    Days of Exercise per Week: 5 days    Minutes of Exercise per Session: 30 min  Stress: No Stress Concern Present (11/04/2023)   [name] of Occupational Health - Occupational Stress Questionnaire    Feeling of Stress : Only a little  Social Connections: Moderately Integrated (11/04/2023)   Social Connection and Isolation Panel [NHANES]    Frequency of Communication with Friends and Family: Three times a week    Frequency of Social Gatherings with Friends and Family: Once a week    Attends Religious Services: More than 4 times per year    Active Member of Clubs or Organizations: No    Attends Engineer, structural: Not on file    Marital Status: Married   Past Surgical History:  Procedure Laterality Date    ABDOMINAL HYSTERECTOMY     CHOLECYSTECTOMY     CHOLECYSTECTOMY, LAPAROSCOPIC     COLONOSCOPY     KNEE ARTHROSCOPY Right 01/02/2023   UPPER GASTROINTESTINAL ENDOSCOPY     Past Surgical History:  Procedure Laterality Date   ABDOMINAL HYSTERECTOMY     CHOLECYSTECTOMY     CHOLECYSTECTOMY, LAPAROSCOPIC     COLONOSCOPY     KNEE ARTHROSCOPY Right 01/02/2023   UPPER GASTROINTESTINAL ENDOSCOPY     Past Medical History:  Diagnosis Date   Allergy    Anemia    Anxiety    Arthritis    Dyslipidemia    Fibromyalgia    GERD (gastroesophageal reflux disease)    Headache    Migraines    Pericarditis    Stroke (HCC) 12/2020   Tachycardia    TIA (transient ischemic attack)    Recurrent   BP 124/68 (BP Location: Left Arm, Patient Position: Sitting, Cuff Size: Large)   Pulse 80   Ht 5' 6 (1.676 m)   Wt 242 lb (109.8 kg)   SpO2 97%   BMI 39.06 kg/m   Opioid Risk Score:   Fall Risk Score:  `1  Depression screen [facility] 2/9     12/30/2023    3:33 PM 12/17/2023    3:22 PM 11/04/2023    3:11 PM 10/17/2023    3:12 PM 09/26/2023    3:38 PM 09/12/2023   12:57 PM 08/08/2023    3:42 PM  Depression screen PHQ 2/9  Decreased Interest 1 0 0 0 0 0 0  Down, Depressed, Hopeless 0 0 0 0 0 0 0  PHQ - 2 Score 1 0 0 0 0 0 0  Altered sleeping   0      Tired, decreased energy   0      Change in appetite   0      Feeling bad or failure about yourself    0      Trouble concentrating   0      Moving slowly or fidgety/restless   0      Suicidal thoughts   0      PHQ-9 Score   0      Difficult doing work/chores   Not difficult at all          Review of Systems  Musculoskeletal:  Positive for back pain, joint swelling and myalgias.       Lower back pain, right knee pain radiates down to foot.  All other systems reviewed and are negative.  Objective:   Physical Exam Vitals and nursing note reviewed.  Constitutional:      Appearance: Normal appearance.  Cardiovascular:     Rate and Rhythm:  Normal rate and regular rhythm.     Pulses: Normal pulses.     Heart sounds: Normal heart sounds.  Pulmonary:     Effort: Pulmonary effort is normal.     Breath sounds: Normal breath sounds.  Musculoskeletal:     Comments: Normal Muscle Bulk and Muscle Testing Reveals:  Upper Extremities: Full ROM and Muscle Strength 5/5 Bilateral AC Joint Tenderness: R>L  Lumbar Hypersensitivity Right Greater Trochanter Tenderness Lower Extremities: Right: Decreased ROM and Muscle Strength 5/5 Right Lower Extremity Flexion  Produces Pain in her Right Patella Left Lower Extremity: Full ROM and Muscle Strength 5/5 Arises from chair slowly Antalgic Gait     Skin:    General: Skin is warm and dry.  Neurological:     Mental Status: She is alert and oriented to person, place, and time.  Psychiatric:        Mood and Affect: Mood normal.        Behavior: Behavior normal.         Assessment & Plan:  Lumbo Sacral Spondylosis: Chronic Bilateral Low Back Pain: Continue HEP as Tolerated. Continue to Monitor. S/P On 09/12/2023, with Dr Sharl Davies.  Bilateral Lumbar L3, L4 medial branch blocks and L 5 dorsal ramus injection under fluoroscopic guidance . Good relief noted. 12/30/2023 2. Bilateral Greater Trochanter Bursitis: No complaints today. Continue HEP as Tolerated. Continue to Monitor. 12/30/2023 3. Primary Osteoarthritis Right Knee: ortho Following. Continue to Monitor. 12/30/2023 4. Chronic Pain Syndrome: Refilled: Hydrocodone  5/325 mg one tablet every 8 hours as needed for pain #90. We will continue the opioid monitoring program, this consists of regular clinic visits, examinations, urine drug screen, pill counts as well as use of [place]  Controlled Substance Reporting system. A 12 month History has been reviewed on the [place]  Controlled Substance Reporting System on 12/30/2023.    F/U in 1 month

## 2023-12-31 NOTE — Progress Notes (Unsigned)
 [facility]  PATIENT: Desiree Russell DOB: 1970-10-07  REFERRING DOCTOR OR PCP:  Mamatha Sirivol, MD SOURCE: Patient, notes from primary care, notes from neurolog[facility] and [facility]), results of the EEG testing, imaging and lab results, MRI brain and CT angiogram personally reviewed _________________________________   HISTORICAL  CHIEF COMPLAINT:  Chief Complaint  Patient presents with   RM11/Seizures    Pt is here Alone. Pt states that she had seizure like activity on the 14th. Pt states that states that she was at work when it happened. Pt states that she was feeling funny before she blacked out. Pt states that her words were slurred. Pt states that she has Complex migraines. Pt is needing a medical clearance form filled out.      HISTORY OF PRESENT ILLNESS:  I had the pleasure of seeing patient, Desiree Russell, at [facility] for neurologic consultation regarding her spells  She is a 53 year old woman  who has had episodes of blank stares lasting a few minutes.  If people talk to her or shake her she does not recall.    There is no GTC like activity.   These occur 1-2 times a month and can occur at home or work.   She goes to sleep after an episode and is fine afterwards.   She notes that before an episode she will often have a warning sensation for 15-30 minutes.    If she is in a car when she experiences this, she will pull over and she has never had a spell of altered awareness while still driving.  She actually had one of her typical spells while being evaluated at [facility] neurology.  The description was "... witnessed one such episode in the examination room today. It lasted for approximately 20 minutes. [name] fixated on a tub of cleaning wipes mounted on the wall and kept turning it around. She asked repetitive questions. She was calm and fully redirectable during the episode "  She was seeing [facility] and was then referred to  [facility].  She has had multiple EEGs and a 48-hour EEG.  She was diagnosed with non-epileptic seizures.  She sees Psychiatry for PTSD.     MRI 12/11/2023 shows multiple small lacunar infarctions in the cerebellum.  No new lesions compared to an MRI from 822/2023.  Neither of these MRI showed any acute findings.  However, MRI 01/20/2021 showed a's punctate acute or subacute right parietal infarction.  She has been on ASA every other day and Plavix  qd x several years  She has a history of complicated migraines.  HA is usually left sided.    She gets N/V, photophobia and dizziness.   She tries not to move during these as it makes symptoms worse and she is dizzy.     She has been on Emgality  with benefit and now they occur just every 2-3 months.  Ubrelvy  often helps when one occurs.    She also has a history of dysesthesias in her legs and sciatic type pain at times.  She is on pregabalin  and oxycodone .  The pregabalin  makes her sleepy so she only takes at night  .    She will be having righ TKR (Dr. Genevive Ket)  Vascular risks: Hypertension, migraine, pericarditis  Imaging: MRI of the brain 12/11/2023 showed no acute findings.  There were multiple small lacunar infarctions in the cerebellum and the right thalamus.  A couple scattered T2/FLAIR hyperintense foci in the cerebral hemispheres  including 1 in the juxtacortical and gray matter of the right frontal lobe.  No changes compared to an MRI from 2023  CT angiogram 12/12/2023 shows no acute findings.  There were no large vessel occlusions and no significant stenosis.  Note is made of diminutive vertebral basilar system.  There are fetal origins of both PCAs  Laboratory: CMP showed mildly elevated creatinine at 1.13 with GFR of 59.  Urine drug screen showed benzodiazepines  REVIEW OF SYSTEMS: Constitutional: No fevers, chills, sweats, or change in appetite Eyes: No visual changes, double vision, eye pain Ear, nose and throat: No hearing loss, ear pain, nasal  congestion, sore throat Cardiovascular: No chest pain, palpitations Respiratory:  No shortness of breath at rest or with exertion.   No wheezes GastrointestinaI: No nausea, vomiting, diarrhea, abdominal pain, fecal incontinence Genitourinary:  No dysuria, urinary retention or frequency.  No nocturia. Musculoskeletal:  No neck pain, back pain Integumentary: No rash, pruritus, skin lesions Neurological: as above Psychiatric: No depression at this time.  No anxiety Endocrine: No palpitations, diaphoresis, change in appetite, change in weigh or increased thirst Hematologic/Lymphatic:  No anemia, purpura, petechiae. Allergic/Immunologic: No itchy/runny eyes, nasal congestion, recent allergic reactions, rashes  ALLERGIES: Allergies  Allergen Reactions   Morphine  Hives and Other (See Comments)    Headaches, also    HOME MEDICATIONS:  Current Outpatient Medications:    acetaminophen  (TYLENOL ) 325 MG tablet, Take 650 mg by mouth every 6 (six) hours as needed for mild pain, fever or headache., Disp: , Rfl:    aspirin  81 MG EC tablet, Take 1 tablet (81 mg total) by mouth daily., Disp: 21 tablet, Rfl: 0   Calcium -Vitamin D -Vitamin K 650-12.5-40 MG-MCG-MCG CHEW, Chew 1 tablet by mouth daily., Disp: , Rfl:    cholecalciferol (VITAMIN D3) 25 MCG (1000 UNIT) tablet, Take 1,000 Units by mouth daily., Disp: , Rfl:    clopidogrel  (PLAVIX ) 75 MG tablet, Take 1 tablet (75 mg total) by mouth once daily, Disp: 90 tablet, Rfl: 0   docusate sodium  (COLACE) 100 MG capsule, Take 100 mg by mouth 2 (two) times daily as needed for mild constipation or moderate constipation., Disp: , Rfl:    Fezolinetant  (VEOZAH ) 45 MG TABS, Take 1 tablet (45 mg total) by mouth daily., Disp: 90 tablet, Rfl: 0   FLUoxetine  (PROZAC ) 20 MG capsule, Take 1 capsule (20 mg total) by mouth every evening., Disp: 90 capsule, Rfl: 0   furosemide  (LASIX ) 20 MG tablet, Take 1 tablet by mouth daily as needed. (Patient taking differently: Take 20  mg by mouth daily as needed for fluid or edema.), Disp: 90 tablet, Rfl: 3   Galcanezumab -gnlm (EMGALITY ) 120 MG/ML SOAJ, Inject 120 mg into the skin every 30 (thirty) days., Disp: 3 mL, Rfl: 2   lamoTRIgine (LAMICTAL) 25 MG tablet, Take 1 tablet (25 mg total) by mouth daily., Disp: 60 tablet, Rfl: 5   metoprolol  succinate (TOPROL  XL) 50 MG 24 hr tablet, Take 1 tablet (50 mg total) by mouth at bedtime., Disp: 90 tablet, Rfl: 3   Multiple Vitamin (MULTIVITAMIN WITH MINERALS) TABS tablet, Take 1 tablet by mouth daily., Disp: , Rfl:    nabumetone  (RELAFEN ) 750 MG tablet, Take 1 tablet (750 mg total) by mouth 2 (two) times daily., Disp: 60 tablet, Rfl: 1   OLANZapine  (ZYPREXA ) 5 MG tablet, Take 1 tablet (5 mg total) by mouth every evening., Disp: 90 tablet, Rfl: 0   Omega-3 Fatty Acids (OMEGA-3 CF PO), Take 1 capsule by mouth daily  at 12 noon., Disp: , Rfl:    Oxycodone  HCl 10 MG TABS, Take 1 tablet (10 mg total) by mouth 4 (four) times daily as needed., Disp: 120 tablet, Rfl: 0   pantoprazole  (PROTONIX ) 40 MG tablet, Take 1 tablet by mouth daily., Disp: 90 tablet, Rfl: 1   pregabalin  (LYRICA ) 150 MG capsule, Take 2 capsules (300 mg total) by mouth at bedtime., Disp: 90 capsule, Rfl: 0   Ubrogepant  (UBRELVY ) 100 MG TABS, TAKE  1/2 TABLET BY MOUTH AT HEADACHE ONSET. MAY REPEAT DOSE IN TWO HOURS IF NO IMPROVEMENT. DO NOT EXCEED MORE THAN TWO TABLETS IN 24 HOURS. (Patient taking differently: Take 100 mg by mouth 2 (two) times daily as needed (migraine). TAKE 1/2 TABLET BY MOUTH AT HEADACHE ONSET. MAY REPEAT DOSE IN TWO HOURS IF NO IMPROVEMENT. DO NOT EXCEED MORE THAN TWO TABLETS IN 24 HOURS.), Disp: 18 tablet, Rfl: 1   valACYclovir  (VALTREX ) 500 MG tablet, Take 1 tablet (500 mg total) by mouth 2 (two) times daily for 5 days (Patient taking differently: Take 500 mg by mouth 2 (two) times daily as needed (Flare up).), Disp: 10 tablet, Rfl: 1   vortioxetine  HBr (TRINTELLIX ) 20 MG TABS tablet, Take 1 tablet (20 mg  total) by mouth daily., Disp: 90 tablet, Rfl: 0   Fezolinetant  (VEOZAH ) 45 MG TABS, Take 45 mg by mouth. (Patient not taking: Reported on 01/01/2024), Disp: , Rfl:   PAST MEDICAL HISTORY: Past Medical History:  Diagnosis Date   Allergy    Anemia    Anxiety    Arthritis    Dyslipidemia    Fibromyalgia    GERD (gastroesophageal reflux disease)    Headache    Migraines    Pericarditis    Stroke (HCC) 12/2020   Tachycardia    TIA (transient ischemic attack)    Recurrent    PAST SURGICAL HISTORY: Past Surgical History:  Procedure Laterality Date   ABDOMINAL HYSTERECTOMY     CHOLECYSTECTOMY     CHOLECYSTECTOMY, LAPAROSCOPIC     COLONOSCOPY     KNEE ARTHROSCOPY Right 01/02/2023   UPPER GASTROINTESTINAL ENDOSCOPY      FAMILY HISTORY: Family History  Problem Relation Age of Onset   Hypertension Mother    Hypertension Father    Heart attack Father 53   Healthy Sister    Healthy Sister    Healthy Sister    Hypertension Brother    Diabetes type II Brother    COPD Brother    Healthy Brother    Healthy Brother    Breast cancer Maternal Aunt    Throat cancer Maternal Uncle    Hypertension Maternal Grandmother    Diabetes Paternal Surveyor, minerals    Healthy Daughter    Healthy Son    Healthy Son    Healthy Son    Colon cancer Neg Hx    Pancreatic cancer Neg Hx    Stomach cancer Neg Hx    Liver disease Neg Hx    Esophageal cancer Neg Hx    Rectal cancer Neg Hx     SOCIAL HISTORY: Social History   Socioeconomic History   Marital status: Married    Spouse name: Not on file   Number of children: 6   Years of education: Not on file   Highest education level: Bachelor's degree (e.g., BA, AB, BS)  Occupational History   Not on file  Tobacco Use   Smoking status: Never   Smokeless tobacco: Never  Vaping Use   Vaping status:  Never Used  Substance and Sexual Activity   Alcohol use: Yes    Alcohol/week: 1.0 standard drink of alcohol    Types: 1 Glasses of wine per  week    Comment: occ    Drug use: No   Sexual activity: Yes    Partners: Male    Birth control/protection: Surgical  Other Topics Concern   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     Social Drivers of Corporate investment banker Strain: Low Risk  (11/04/2023)   Overall Financial Resource Strain (CARDIA)    Difficulty of Paying Living Expenses: Not hard at all  Food Insecurity: No Food Insecurity (12/11/2023)   Hunger Vital Sign    Worried About Running Out of Food in the Last Year: Never true    Ran Out of Food in the Last Year: Never true  Transportation Needs: No Transportation Needs (12/11/2023)   PRAPARE - Administrator, Civil Service (Medical): No    Lack of Transportation (Non-Medical): No  Physical Activity: Sufficiently Active (11/04/2023)   Exercise Vital Sign    Days of Exercise per Week: 5 days    Minutes of Exercise per Session: 30 min  Stress: No Stress Concern Present (11/04/2023)   [name] of Occupational Health - Occupational Stress Questionnaire    Feeling of Stress : Only a little  Social Connections: Moderately Integrated (11/04/2023)   Social Connection and Isolation Panel [NHANES]    Frequency of Communication with Friends and Family: Three times a week    Frequency of Social Gatherings with Friends and Family: Once a week    Attends Religious Services: More than 4 times per year    Active Member of [employer] or Organizations: No    Attends Banker Meetings: Not on file    Marital Status: Married  Catering manager Violence: Not At Risk (12/11/2023)   Humiliation, Afraid, Rape, and Kick questionnaire    Fear of Current or Ex-Partner: No    Emotionally Abused: No    Physically Abused: No    Sexually Abused: No       PHYSICAL EXAM  Vitals:   01/01/24 1445  BP: 120/81  Pulse: 72  Weight: 242 lb (109.8 kg)  Height: 5\' 4"  (1.626 m)    Body mass index is 41.54  kg/m.   General: The patient is well-developed and well-nourished and in no acute distress  HEENT:  Head is [place]/AT.  Sclera are anicteric.  Funduscopic exam shows normal optic discs and retinal vessels.  Neck: No carotid bruits are noted.  The neck is nontender.  Cardiovascular: The heart has a regular rate and rhythm with a normal S1 and S2. There were no murmurs, gallops or rubs.    Skin: Extremities are without rash or  edema.  Musculoskeletal:  Back is nontender  Neurologic Exam  Mental status: The patient is alert and oriented x 3 at the time of the examination. The patient has apparent normal recent and remote memory, with an apparently normal attention span and concentration ability.   Speech is normal.  Cranial nerves: Extraocular movements are full. Pupils are equal, round, and reactive to light and accomodation.  Visual fields are full.  Facial symmetry is present. There is good facial sensation to soft touch bilaterally.Facial strength is normal.  Trapezius and sternocleidomastoid strength is normal. No dysarthria is noted.  The tongue is midline, and the  patient has symmetric elevation of the soft palate. No obvious hearing deficits are noted.  Motor:  Muscle bulk is normal.   Tone is normal. Strength is  5 / 5 in all 4 extremities.   Sensory: Sensory testing is intact to pinprick, soft touch and vibration sensation in all 4 extremities.  Coordination: Cerebellar testing reveals good finger-nose-finger and heel-to-shin bilaterally.  Gait and station: Station is normal.   Gait is normal. Tandem gait is mildly wide. Romberg is negative.   Reflexes: Deep tendon reflexes are symmetric and normal bilaterally.   Plantar responses are flexor.    DIAGNOSTIC DATA (LABS, IMAGING, TESTING) - I reviewed patient records, labs, notes, testing and imaging myself where available.  Lab Results  Component Value Date   WBC 4.5 12/12/2023   HGB 12.7 12/12/2023   HCT 39.0 12/12/2023    MCV 89.7 12/12/2023   PLT 200 12/12/2023      Component Value Date/Time   NA 138 12/12/2023 0627   NA 141 11/04/2023 1557   K 4.1 12/12/2023 0627   CL 106 12/12/2023 0627   CO2 25 12/12/2023 0627   GLUCOSE 82 12/12/2023 0627   BUN 10 12/12/2023 0627   BUN 17 11/04/2023 1557   CREATININE 0.98 12/12/2023 0627   CALCIUM  8.6 (L) 12/12/2023 0627   PROT 6.5 12/11/2023 1109   PROT 6.6 11/04/2023 1557   ALBUMIN 3.6 12/11/2023 1109   ALBUMIN 4.2 11/04/2023 1557   AST 22 12/11/2023 1109   ALT 18 12/11/2023 1109   ALKPHOS 33 (L) 12/11/2023 1109   BILITOT 0.3 12/11/2023 1109   BILITOT <0.2 11/04/2023 1557   GFRNONAA >60 12/12/2023 0627   GFRAA >60 12/12/2014 1747   Lab Results  Component Value Date   CHOL 137 08/16/2022   HDL 47 08/16/2022   LDLCALC 78 08/16/2022   TRIG 55 08/16/2022   CHOLHDL 2.9 08/16/2022   Lab Results  Component Value Date   HGBA1C 5.5 07/14/2021   Lab Results  Component Value Date   VITAMINB12 384 12/20/2021   Lab Results  Component Value Date   TSH 0.450 08/16/2022       ASSESSMENT AND PLAN  Psychogenic nonepileptic seizure  Episodic migraine  PTSD (post-traumatic stress disorder)  Dysesthesia  Chronic bilateral low back pain without sciatica   In summary, [name] is a 53 year old woman who has a several year history of unusual spells with altered awareness.  A description of a typical spell that was witnessed by neurology at [facility] is in the history above.  The spells are very consistent with nonepileptiform/psychogenic seizures.  Although there is no perfect treatment, psychiatric care is recommended.  The etiology is likely related to her PTSD.    She also has dysesthesias and other chronic pain and migraine headaches.  Due to the continued symptoms, I did write a prescription for lamotrigine 25 mg twice daily and if there is a benefit this can be increased.     There is no evidence that she has epilepsy.  She reports having an aura  of feeling strange that last many minutes before her more significant symptoms.  If this ever occurs while driving, she pulls over.  I did not schedule a follow-up as she has had comprehensive evaluation for the spells with several other neurologists including epilepsy specialist.  If spells worsen she should follow-up with 1 of these.  She is neurologically cleared to proceed with knee surgery.  We will send a form into orthopedics.  Janene Yousuf  Estle Hemp, MD, [facility] 01/01/2024, 10:41 PM Certified in Neurology, Clinical Neurophysiology, Sleep Medicine and Neuroimaging  [facility] [address] [place], [place] [address] [phone]

## 2024-01-01 ENCOUNTER — Ambulatory Visit (INDEPENDENT_AMBULATORY_CARE_PROVIDER_SITE_OTHER): Admitting: Neurology

## 2024-01-01 ENCOUNTER — Other Ambulatory Visit (HOSPITAL_COMMUNITY): Payer: Self-pay

## 2024-01-01 ENCOUNTER — Encounter: Payer: Self-pay | Admitting: Neurology

## 2024-01-01 ENCOUNTER — Telehealth: Payer: Self-pay | Admitting: *Deleted

## 2024-01-01 VITALS — BP 120/81 | HR 72 | Ht 64.0 in | Wt 242.0 lb

## 2024-01-01 DIAGNOSIS — F4310 Post-traumatic stress disorder, unspecified: Secondary | ICD-10-CM | POA: Diagnosis not present

## 2024-01-01 DIAGNOSIS — G8929 Other chronic pain: Secondary | ICD-10-CM | POA: Diagnosis not present

## 2024-01-01 DIAGNOSIS — G43909 Migraine, unspecified, not intractable, without status migrainosus: Secondary | ICD-10-CM

## 2024-01-01 DIAGNOSIS — R208 Other disturbances of skin sensation: Secondary | ICD-10-CM

## 2024-01-01 DIAGNOSIS — F445 Conversion disorder with seizures or convulsions: Secondary | ICD-10-CM

## 2024-01-01 DIAGNOSIS — M545 Low back pain, unspecified: Secondary | ICD-10-CM | POA: Diagnosis not present

## 2024-01-01 MED ORDER — LAMOTRIGINE 25 MG PO TABS
25.0000 mg | ORAL_TABLET | Freq: Every day | ORAL | 5 refills | Status: DC
  Filled 2024-01-01: qty 60, 60d supply, fill #0

## 2024-01-01 NOTE — Telephone Encounter (Signed)
  Faxed to both numbers listed on paper, confirmation received for both. Placed in medical records for pick up.

## 2024-01-03 LAB — DRUG TOX MONITOR 1 W/CONF, ORAL FLD
Amphetamines: NEGATIVE ng/mL (ref <10)
Barbiturates: NEGATIVE ng/mL (ref <10)
Benzodiazepines: NEGATIVE ng/mL (ref <0.50)
Buprenorphine: NEGATIVE ng/mL (ref <0.10)
Cocaine: NEGATIVE ng/mL (ref <5.0)
Codeine: NEGATIVE ng/mL (ref <2.5)
Dihydrocodeine: NEGATIVE ng/mL (ref <2.5)
Fentanyl: NEGATIVE ng/mL (ref <0.10)
Heroin Metabolite: NEGATIVE ng/mL (ref <1.0)
Hydrocodone: NEGATIVE ng/mL (ref <2.5)
Hydromorphone: NEGATIVE ng/mL (ref <2.5)
MARIJUANA: NEGATIVE ng/mL (ref <2.5)
MDMA: NEGATIVE ng/mL (ref <10)
Meprobamate: NEGATIVE ng/mL (ref <2.5)
Methadone: NEGATIVE ng/mL (ref <5.0)
Morphine: NEGATIVE ng/mL (ref <2.5)
Nicotine Metabolite: NEGATIVE ng/mL (ref <5.0)
Norhydrocodone: NEGATIVE ng/mL (ref <2.5)
Noroxycodone: 11.1 ng/mL — ABNORMAL HIGH (ref <2.5)
Opiates: POSITIVE ng/mL — AB (ref <2.5)
Oxycodone: 104.1 ng/mL — ABNORMAL HIGH (ref <2.5)
Oxymorphone: NEGATIVE ng/mL (ref <2.5)
Phencyclidine: NEGATIVE ng/mL (ref <10)
Tapentadol: NEGATIVE ng/mL (ref <5.0)
Tramadol: NEGATIVE ng/mL (ref <5.0)
Zolpidem: NEGATIVE ng/mL (ref <5.0)

## 2024-01-03 LAB — DRUG TOX ALC METAB W/CON, ORAL FLD: Alcohol Metabolite: NEGATIVE ng/mL (ref <25)

## 2024-01-22 ENCOUNTER — Other Ambulatory Visit: Payer: Self-pay

## 2024-01-22 DIAGNOSIS — Z8673 Personal history of transient ischemic attack (TIA), and cerebral infarction without residual deficits: Secondary | ICD-10-CM

## 2024-01-23 ENCOUNTER — Other Ambulatory Visit: Payer: Self-pay

## 2024-01-23 ENCOUNTER — Other Ambulatory Visit (HOSPITAL_COMMUNITY): Payer: Self-pay

## 2024-01-23 MED ORDER — CLOPIDOGREL BISULFATE 75 MG PO TABS
75.0000 mg | ORAL_TABLET | Freq: Every day | ORAL | 0 refills | Status: DC
  Filled 2024-01-23: qty 90, 90d supply, fill #0

## 2024-01-28 ENCOUNTER — Encounter: Payer: Self-pay | Admitting: Gastroenterology

## 2024-01-28 ENCOUNTER — Ambulatory Visit (INDEPENDENT_AMBULATORY_CARE_PROVIDER_SITE_OTHER): Admitting: Gastroenterology

## 2024-01-28 VITALS — BP 126/80 | HR 77 | Ht 64.0 in | Wt 244.0 lb

## 2024-01-28 DIAGNOSIS — R140 Abdominal distension (gaseous): Secondary | ICD-10-CM | POA: Diagnosis not present

## 2024-01-28 DIAGNOSIS — K219 Gastro-esophageal reflux disease without esophagitis: Secondary | ICD-10-CM | POA: Diagnosis not present

## 2024-01-28 DIAGNOSIS — R1319 Other dysphagia: Secondary | ICD-10-CM

## 2024-01-28 DIAGNOSIS — K581 Irritable bowel syndrome with constipation: Secondary | ICD-10-CM | POA: Diagnosis not present

## 2024-01-28 DIAGNOSIS — R10814 Left lower quadrant abdominal tenderness: Secondary | ICD-10-CM | POA: Diagnosis not present

## 2024-01-28 DIAGNOSIS — R1030 Lower abdominal pain, unspecified: Secondary | ICD-10-CM | POA: Diagnosis not present

## 2024-01-28 NOTE — Progress Notes (Signed)
 Chief Complaint: GI problems.  Referring Provider:  Sirivol, Mamatha, MD      ASSESSMENT AND PLAN;   #1. Lower abdo pain with LLQ abdo tenderness.  #2. GERD with eso dysphagia (resolved)  #2. IBS-C with abdo bloating/pain.  Element of OIC. Neg colon Jan 2023 except for small tubular adenoma.  Next colonoscopy due Jan 2030  Plan: -CT AP with contrast ASAP -For now, continue senna 4/day -Trial of linzess  290 mcg every day #90.  Then, would wean off senna. -Continue Protonix  40 mg p.o. daily -Lactose-free diet. -Minimize pain meds.  -Increase water intake. -FU thereafter.    HPI:    Desiree Russell is a 53 y.o. female  With H/O CVA (on ASA/plavix ), fibromyalgia, HLD, anxiety, migraines, hypothyroidism, H/O ectopic pregnancy s/p R salpingo-oophorectomy with LOA, hysterectomy, cholecystectomy Works in [employer] pulmonary  With lower abdominal pain associated abdominal bloating with pellet-like stools Has been taking 4 senna per day to move her bowels. No melena or hematochezia.  Lower abdominal pain has been progressively getting worse.  No fever or chills.  No nonsteroidals.  Awaiting right knee replacement. Has been using oxycodone  twice daily.  History of Present Illness      From previous notes: Has longstanding history of constipation all my life and has been on senna for over 4-5 years.  She takes 3/day.  She does pass pellet-like stools.  Also associated abdominal bloating, lower abdominal crampy pain which gets better with defecation.  No weight loss or loss of appetite.  She does take calcium  supplements.  No significant nonsteroidals.  She previously has been on narcotics which has been stopped over the last 1 year.  No sodas, chocolates, chewing gums, artificial sweeteners and candy.     Past GI work-up:  EGD 08/10/2021 - No endoscopic esophageal abnormality to explain patient's dysphagia. Esophagus dilated. Dilated. Bx- neg for EoE -  Gastritis. Bx: neg for HP  Colonoscopy 08/10/2021 - One 6 mm polyp in the distal transverse colon, removed with a cold snare. Resected and retrieved.Bx- TA. Rpt in 7 yrs - Mild melanosis coli. - Minimal sigmoid diverticulosis. - Non-bleeding internal hemorrhoids. - The examined portion of the ileum was normal. - The examination was otherwise normal on direct and retroflexion views.  CT AP with contrast 06/2022 IMPRESSION: 1. No acute localizing process in the abdomen or pelvis. Normal appendix. 2. Status post cholecystectomy and hysterectomy.  CT AP with contrast 03/03/2022 1. There is focal bowel wall thickening of the third portion of the duodenum. This is nonspecific and could reflect a nonspecific duodenitis. Consider correlation with endoscopy if not previously performed. 2. Normal appendix.  CT AP without contrast 03/02/2022 IMPRESSION: 1. No acute findings in the abdomen or pelvis. 2. Trace free fluid in the pelvis. This is probably physiologic in a premenopausal female.   EGD 09/2012 -Gastric ulcer. Bx- neg for HP or malignancy -Mild gastritis  Colonoscopy12/2013 -Fair to poor prep -Mild melanosis coli -Highly redundant.  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 Mom, older sister with significant constipation  Past Medical History:  Diagnosis Date   Allergy    Anemia    Anxiety    Arthritis    Dyslipidemia    Fibromyalgia    GERD (gastroesophageal reflux disease)    Headache    Migraines    Pericarditis    Stroke (HCC) 12/2020   Tachycardia    TIA (transient ischemic attack)    Recurrent    Past Surgical History:  Procedure Laterality Date  ABDOMINAL HYSTERECTOMY     CHOLECYSTECTOMY     CHOLECYSTECTOMY, LAPAROSCOPIC     COLONOSCOPY     KNEE ARTHROSCOPY Right 01/02/2023   UPPER GASTROINTESTINAL ENDOSCOPY      Family History  Problem Relation Age of Onset   Hypertension Mother    Hypertension Father    Heart attack Father 66   Healthy Sister    Healthy  Sister    Healthy Sister    Hypertension Brother    Diabetes type II Brother    COPD Brother    Healthy Brother    Healthy Brother    Breast cancer Maternal Aunt    Throat cancer Maternal Uncle    Hypertension Maternal Grandmother    Diabetes Paternal Surveyor, minerals    Healthy Daughter    Healthy Son    Healthy Son    Healthy Son    Colon cancer Neg Hx    Pancreatic cancer Neg Hx    Stomach cancer Neg Hx    Liver disease Neg Hx    Esophageal cancer Neg Hx    Rectal cancer Neg Hx     Social History   Tobacco Use   Smoking status: Never   Smokeless tobacco: Never  Vaping Use   Vaping status: Never Used  Substance Use Topics   Alcohol use: Yes    Alcohol/week: 1.0 standard drink of alcohol    Types: 1 Glasses of wine per week    Comment: occ    Drug use: No    Current Outpatient Medications  Medication Sig Dispense Refill   acetaminophen  (TYLENOL ) 325 MG tablet Take 650 mg by mouth every 6 (six) hours as needed for mild pain, fever or headache.     aspirin  81 MG EC tablet Take 1 tablet (81 mg total) by mouth daily. 21 tablet 0   Calcium -Vitamin D -Vitamin K 650-12.5-40 MG-MCG-MCG CHEW Chew 1 tablet by mouth daily.     cholecalciferol (VITAMIN D3) 25 MCG (1000 UNIT) tablet Take 1,000 Units by mouth daily.     clopidogrel  (PLAVIX ) 75 MG tablet Take 1 tablet (75 mg total) by mouth once daily 90 tablet 0   docusate sodium  (COLACE) 100 MG capsule Take 100 mg by mouth 2 (two) times daily as needed for mild constipation or moderate constipation.     Fezolinetant  (VEOZAH ) 45 MG TABS Take 45 mg by mouth.     FLUoxetine  (PROZAC ) 20 MG capsule Take 1 capsule (20 mg total) by mouth every evening. 90 capsule 0   furosemide  (LASIX ) 20 MG tablet Take 1 tablet by mouth daily as needed. (Patient taking differently: Take 20 mg by mouth daily as needed for fluid or edema.) 90 tablet 3   Galcanezumab -gnlm (EMGALITY ) 120 MG/ML SOAJ Inject 120 mg into the skin every 30 (thirty) days. 3 mL 2    lamoTRIgine  (LAMICTAL ) 25 MG tablet Take 1 tablet (25 mg total) by mouth daily. 60 tablet 5   metoprolol  succinate (TOPROL  XL) 50 MG 24 hr tablet Take 1 tablet (50 mg total) by mouth at bedtime. 90 tablet 3   Multiple Vitamin (MULTIVITAMIN WITH MINERALS) TABS tablet Take 1 tablet by mouth daily.     nabumetone  (RELAFEN ) 750 MG tablet Take 1 tablet (750 mg total) by mouth 2 (two) times daily. 60 tablet 1   OLANZapine  (ZYPREXA ) 5 MG tablet Take 1 tablet (5 mg total) by mouth every evening. 90 tablet 0   Omega-3 Fatty Acids (OMEGA-3 CF PO) Take 1 capsule by mouth daily at  12 noon.     Oxycodone  HCl 10 MG TABS Take 1 tablet (10 mg total) by mouth 4 (four) times daily as needed. 120 tablet 0   pantoprazole  (PROTONIX ) 40 MG tablet Take 1 tablet by mouth daily. 90 tablet 1   pregabalin  (LYRICA ) 150 MG capsule Take 2 capsules (300 mg total) by mouth at bedtime. 90 capsule 0   Ubrogepant  (UBRELVY ) 100 MG TABS TAKE  1/2 TABLET BY MOUTH AT HEADACHE ONSET. MAY REPEAT DOSE IN TWO HOURS IF NO IMPROVEMENT. DO NOT EXCEED MORE THAN TWO TABLETS IN 24 HOURS. (Patient taking differently: Take 100 mg by mouth 2 (two) times daily as needed (migraine). TAKE 1/2 TABLET BY MOUTH AT HEADACHE ONSET. MAY REPEAT DOSE IN TWO HOURS IF NO IMPROVEMENT. DO NOT EXCEED MORE THAN TWO TABLETS IN 24 HOURS.) 18 tablet 1   valACYclovir  (VALTREX ) 500 MG tablet Take 1 tablet (500 mg total) by mouth 2 (two) times daily for 5 days (Patient taking differently: Take 500 mg by mouth 2 (two) times daily as needed (Flare up).) 10 tablet 1   vortioxetine  HBr (TRINTELLIX ) 20 MG TABS tablet Take 1 tablet (20 mg total) by mouth daily. 90 tablet 0   Fezolinetant  (VEOZAH ) 45 MG TABS Take 1 tablet (45 mg total) by mouth daily. (Patient not taking: Reported on 01/28/2024) 90 tablet 0   No current facility-administered medications for this visit.    Allergies  Allergen Reactions   Morphine  Hives and Other (See Comments)    Headaches, also    Review of  Systems:  neg     Physical Exam:    BP 126/80   Pulse 77   Ht 5' 4 (1.626 m)   Wt 244 lb (110.7 kg)   BMI 41.88 kg/m  Wt Readings from Last 3 Encounters:  01/28/24 244 lb (110.7 kg)  01/01/24 242 lb (109.8 kg)  12/30/23 242 lb (109.8 kg)   Constitutional:  Well-developed, in no acute distress. Psychiatric: Normal mood and affect. Behavior is normal. HEENT: Pupils normal.  Conjunctivae are normal. No scleral icterus. Cardiovascular: Normal rate, regular rhythm. No edema Pulmonary/chest: Effort normal and breath sounds normal. No wheezing, rales or rhonchi. Abdominal: Soft, nondistended. LLQ tenderness without rebound Bowel sounds active throughout. There are no masses palpable. No hepatomegaly. Neurological: Alert and oriented to person place and time. Skin: Skin is warm and dry. No rashes noted.  Data Reviewed: I have personally reviewed following labs and imaging studies  CBC:    Latest Ref Rng & Units 12/12/2023    6:27 AM 12/11/2023   11:09 AM 11/04/2023    3:57 PM  CBC  WBC 4.0 - 10.5 K/uL 4.5  5.2  8.3   Hemoglobin 12.0 - 15.0 g/dL 87.2  85.6    87.2  87.8   Hematocrit 36.0 - 46.0 % 39.0  42.0    38.2  38.5   Platelets 150 - 400 K/uL 200  172  207     CMP:    Latest Ref Rng & Units 12/12/2023    6:27 AM 12/11/2023   11:09 AM 11/04/2023    3:57 PM  CMP  Glucose 70 - 99 mg/dL 82  899    892  83   BUN 6 - 20 mg/dL 10  15    13  17    Creatinine 0.44 - 1.00 mg/dL 9.01  8.89    8.86  8.75   Sodium 135 - 145 mmol/L 138  139    139  141   Potassium 3.5 - 5.1 mmol/L 4.1  3.6    3.7  4.4   Chloride 98 - 111 mmol/L 106  99    104  102   CO2 22 - 32 mmol/L 25  27  22    Calcium  8.9 - 10.3 mg/dL 8.6  9.0  9.5   Total Protein 6.5 - 8.1 g/dL  6.5  6.6   Total Bilirubin 0.0 - 1.2 mg/dL  0.3  <9.7   Alkaline Phos 38 - 126 U/L  33  63   AST 15 - 41 U/L  22  19   ALT 0 - 44 U/L  18  22         Anselm Bring, MD 01/28/2024, 3:35 PM  Cc: Sirivol, Mamatha, MD

## 2024-01-28 NOTE — Patient Instructions (Addendum)
 _______________________________________________________  If your blood pressure at your visit was 140/90 or greater, please contact your primary care physician to follow up on this.  _______________________________________________________  If you are age 51 or older, your body mass index should be between 23-30. Your Body mass index is 41.88 kg/m. If this is out of the aforementioned range listed, please consider follow up with your Primary Care Provider.  If you are age 66 or younger, your body mass index should be between 19-25. Your Body mass index is 41.88 kg/m. If this is out of the aformentioned range listed, please consider follow up with your Primary Care Provider.   ________________________________________________________  The [place] [facility] providers would like to encourage you to use MYCHART to communicate with providers for non-urgent requests or questions.  Due to long hold times on the telephone, sending your provider a message by [facility] may be a faster and more efficient way to get a response.  Please allow 48 business hours for a response.  Please remember that this is for non-urgent requests.  _______________________________________________________  We have given you samples of the following medication to take: Linzess    You have been scheduled for a CT scan of the abdomen and pelvis at [facility] [place], [place], [place] [address]).   You are scheduled on 02-17-24 at 5pm. You should arrive by 245pm your appointment time for registration. Please follow the written instructions below on the day of your exam:  WARNING: IF YOU ARE ALLERGIC TO IODINE/X-RAY DYE, PLEASE NOTIFY RADIOLOGY IMMEDIATELY AT [phone]! YOU WILL BE GIVEN A 13 HOUR PREMEDICATION PREP.  1) Do not eat or drink anything after 1pm  (4 hours prior to your test) 2) You will be given 2 bottles of oral contrast to drink on site.    Drink 1 bottle of contrast @ 3pm (2 hours prior to  your exam)  Drink 1 bottle of contrast @ 4pm (1 hour prior to your exam)  You may take any medications as prescribed with a small amount of water, if necessary. If you take any of the following medications: METFORMIN, GLUCOPHAGE, GLUCOVANCE, AVANDAMET, RIOMET, FORTAMET, ACTOPLUS MET, JANUMET, GLUMETZA or METAGLIP, you MAY be asked to HOLD this medication 48 hours AFTER the exam.  The purpose of you drinking the oral contrast is to aid in the visualization of your intestinal tract. The contrast solution may cause some diarrhea. Depending on your individual set of symptoms, you may also receive an intravenous injection of x-ray contrast/dye. Plan on being at [facility] for 30 minutes or longer, depending on the type of exam you are having performed.  This test typically takes 30-45 minutes to complete.  If you have any questions regarding your exam or if you need to reschedule, you may call the CT department at [phone] between the hours of 8:00 am and 5:00 pm, Monday-Friday.  Thank you,  Dr. Lynnie Bring

## 2024-01-29 ENCOUNTER — Encounter: Attending: Physical Medicine & Rehabilitation | Admitting: Registered Nurse

## 2024-01-29 ENCOUNTER — Encounter: Payer: Self-pay | Admitting: Registered Nurse

## 2024-01-29 ENCOUNTER — Other Ambulatory Visit (HOSPITAL_COMMUNITY): Payer: Self-pay

## 2024-01-29 VITALS — BP 122/84 | HR 76 | Ht 64.0 in | Wt 244.0 lb

## 2024-01-29 DIAGNOSIS — G8929 Other chronic pain: Secondary | ICD-10-CM | POA: Insufficient documentation

## 2024-01-29 DIAGNOSIS — Z79891 Long term (current) use of opiate analgesic: Secondary | ICD-10-CM | POA: Diagnosis not present

## 2024-01-29 DIAGNOSIS — M25551 Pain in right hip: Secondary | ICD-10-CM | POA: Diagnosis not present

## 2024-01-29 DIAGNOSIS — Z5181 Encounter for therapeutic drug level monitoring: Secondary | ICD-10-CM | POA: Insufficient documentation

## 2024-01-29 DIAGNOSIS — M545 Low back pain, unspecified: Secondary | ICD-10-CM | POA: Diagnosis not present

## 2024-01-29 DIAGNOSIS — G894 Chronic pain syndrome: Secondary | ICD-10-CM | POA: Diagnosis not present

## 2024-01-29 DIAGNOSIS — M1711 Unilateral primary osteoarthritis, right knee: Secondary | ICD-10-CM | POA: Diagnosis not present

## 2024-01-29 MED ORDER — OXYCODONE HCL 10 MG PO TABS
10.0000 mg | ORAL_TABLET | Freq: Every day | ORAL | 0 refills | Status: DC | PRN
  Filled 2024-01-29: qty 150, 30d supply, fill #0

## 2024-01-29 NOTE — Progress Notes (Signed)
 Subjective:    Patient ID: Desiree Russell, female    DOB: 1971-02-13, 53 y.o.   MRN: [id number]  HPI: Desiree Russell is a 53 y.o. female who returns for follow up appointment for chronic pain and medication refill. She states her pain is located in her lower back, right hip and right knee pain, she reports she is only receiving 4 hours of relief with her current medication regimen. Desiree Russell is scheduled to have right knee surgery next month. She rates her pain 7. Her current exercise regime is walking short distances and performing stretching exercises.   Desiree Russell Morphine  equivalent is 60.00 MME.   Last Oral Swab was Performed on 12/30/2023, it was consistent.   Pain Inventory Average Pain 5 Pain Right Now back 4 and knee 7 My pain is sharp, stabbing, and aching  In the last 24 hours, has pain interfered with the following? General activity 6 Relation with others 0 Enjoyment of life 6 What TIME of day is your pain at its worst? evening Sleep (in general) Fair  Pain is worse with: walking, bending, standing, and some activites Pain improves with: heat/ice, medication, and elevation, compression stocking Relief from Meds: 8  Family History  Problem Relation Age of Onset   Hypertension Mother    Hypertension Father    Heart attack Father 43   Healthy Sister    Healthy Sister    Healthy Sister    Hypertension Brother    Diabetes type II Brother    COPD Brother    Healthy Brother    Healthy Brother    Breast cancer Maternal Aunt    Throat cancer Maternal Uncle    Hypertension Maternal Grandmother    Diabetes Paternal Surveyor, minerals    Healthy Daughter    Healthy Son    Healthy Son    Healthy Son    Colon cancer Neg Hx    Pancreatic cancer Neg Hx    Stomach cancer Neg Hx    Liver disease Neg Hx    Esophageal cancer Neg Hx    Rectal cancer Neg Hx    Social History   Socioeconomic History   Marital status: Married    Spouse name: Not on file   Number of children: 6    Years of education: Not on file   Highest education level: Bachelor's degree (e.g., BA, AB, BS)  Occupational History   Not on file  Tobacco Use   Smoking status: Never   Smokeless tobacco: Never  Vaping Use   Vaping status: Never Used  Substance and Sexual Activity   Alcohol use: Yes    Alcohol/week: 1.0 standard drink of alcohol    Types: 1 Glasses of wine per week    Comment: occ    Drug use: No   Sexual activity: Yes    Partners: Male    Birth control/protection: Surgical  Other Topics Concern   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     Social Drivers of Corporate investment banker Strain: Low Risk  (11/04/2023)   Overall Financial Resource Strain (CARDIA)    Difficulty of Paying Living Expenses: Not hard at all  Food Insecurity: No Food Insecurity (12/11/2023)   Hunger Vital Sign    Worried About Running Out of Food in the Last Year: Never true    Ran Out of Food in the Last Year: Never true  Transportation Needs: No  Transportation Needs (12/11/2023)   PRAPARE - Administrator, Civil Service (Medical): No    Lack of Transportation (Non-Medical): No  Physical Activity: Sufficiently Active (11/04/2023)   Exercise Vital Sign    Days of Exercise per Week: 5 days    Minutes of Exercise per Session: 30 min  Stress: No Stress Concern Present (11/04/2023)   [name] of Occupational Health - Occupational Stress Questionnaire    Feeling of Stress : Only a little  Social Connections: Moderately Integrated (11/04/2023)   Social Connection and Isolation Panel    Frequency of Communication with Friends and Family: Three times a week    Frequency of Social Gatherings with Friends and Family: Once a week    Attends Religious Services: More than 4 times per year    Active Member of Clubs or Organizations: No    Attends Engineer, structural: Not on file    Marital Status: Married   Past Surgical  History:  Procedure Laterality Date   ABDOMINAL HYSTERECTOMY     CHOLECYSTECTOMY     CHOLECYSTECTOMY, LAPAROSCOPIC     COLONOSCOPY     KNEE ARTHROSCOPY Right 01/02/2023   UPPER GASTROINTESTINAL ENDOSCOPY     Past Surgical History:  Procedure Laterality Date   ABDOMINAL HYSTERECTOMY     CHOLECYSTECTOMY     CHOLECYSTECTOMY, LAPAROSCOPIC     COLONOSCOPY     KNEE ARTHROSCOPY Right 01/02/2023   UPPER GASTROINTESTINAL ENDOSCOPY     Past Medical History:  Diagnosis Date   Allergy    Anemia    Anxiety    Arthritis    Dyslipidemia    Fibromyalgia    GERD (gastroesophageal reflux disease)    Headache    Migraines    Pericarditis    Stroke (HCC) 12/2020   Tachycardia    TIA (transient ischemic attack)    Recurrent   BP 122/84   Pulse 76   Ht 5' 4 (1.626 m)   Wt 244 lb (110.7 kg)   SpO2 95%   BMI 41.88 kg/m   Opioid Risk Score:   Fall Risk Score:  `1  Depression screen [facility] 2/9     12/30/2023    3:33 PM 12/17/2023    3:22 PM 11/04/2023    3:11 PM 10/17/2023    3:12 PM 09/26/2023    3:38 PM 09/12/2023   12:57 PM 08/08/2023    3:42 PM  Depression screen PHQ 2/9  Decreased Interest 1 0 0 0 0 0 0  Down, Depressed, Hopeless 0 0 0 0 0 0 0  PHQ - 2 Score 1 0 0 0 0 0 0  Altered sleeping   0      Tired, decreased energy   0      Change in appetite   0      Feeling bad or failure about yourself    0      Trouble concentrating   0      Moving slowly or fidgety/restless   0      Suicidal thoughts   0      PHQ-9 Score   0      Difficult doing work/chores   Not difficult at all         Review of Systems  Musculoskeletal:  Positive for back pain.       Right knee pain  All other systems reviewed and are negative.      Objective:   Physical Exam Vitals and nursing  note reviewed.  Constitutional:      Appearance: Normal appearance. She is obese.  Cardiovascular:     Rate and Rhythm: Normal rate and regular rhythm.     Pulses: Normal pulses.     Heart sounds: Normal  heart sounds.  Pulmonary:     Effort: Pulmonary effort is normal.     Breath sounds: Normal breath sounds.  Musculoskeletal:     Comments: Normal Muscle Bulk and Muscle Testing Reveals:  Upper Extremities: Full ROM and Muscle Strength 5/5 Lumbar Paraspinal Tenderness: L-4-L-5 Right Greater Trochanter Tenderness  Lower Extremities : Right: Decreased ROM and Muscle Strength 5/5 Right Lower Extremity Flexion Produces Pain into her  Right Hip, Right lower Extremity and Right Patella Right Patella: + Swelling  Left Lower Extremity: Full ROM and Muscle Strength 5/5 Arises from Table slowly Antalgic Gait     Skin:    General: Skin is warm and dry.  Neurological:     Mental Status: She is alert and oriented to person, place, and time.  Psychiatric:        Mood and Affect: Mood normal.        Behavior: Behavior normal.          Assessment & Plan:  Lumbo Sacral Spondylosis: Chronic Bilateral Low Back Pain: Continue HEP as Tolerated. Continue to Monitor. S/P On 09/12/2023, with Dr Carilyn.  Bilateral Lumbar L3, L4 medial branch blocks and L 5 dorsal ramus injection under fluoroscopic guidance . Good relief noted. 01/29/2024 2. Chronic Right Hip Pain/ Bilateral Greater Trochanter Bursitis:  Continue HEP as Tolerated. Continue to Monitor. 01/29/2024 3. Primary Osteoarthritis Right Knee: ortho Following. She is scheduled for Right knee surgery in August, she reports. Continue to Monitor. 01/29/2024 4. Chronic Pain Syndrome: Increased: Refilled:  Oxycodone  10 mg one tablet 5 times a day as needed for pain #150 We will continue the opioid monitoring program, this consists of regular clinic visits, examinations, urine drug screen, pill counts as well as use of [place]  Controlled Substance Reporting system. A 12 month History has been reviewed on the [place]  Controlled Substance Reporting System on 01/29/2024.    F/U in 1 month

## 2024-02-02 ENCOUNTER — Other Ambulatory Visit: Payer: Self-pay

## 2024-02-03 ENCOUNTER — Other Ambulatory Visit (HOSPITAL_COMMUNITY): Payer: Self-pay

## 2024-02-03 MED ORDER — VEOZAH 45 MG PO TABS
1.0000 | ORAL_TABLET | Freq: Every day | ORAL | 0 refills | Status: DC
  Filled 2024-02-03: qty 90, 90d supply, fill #0

## 2024-02-07 DIAGNOSIS — Z713 Dietary counseling and surveillance: Secondary | ICD-10-CM | POA: Diagnosis not present

## 2024-02-07 DIAGNOSIS — Z6841 Body Mass Index (BMI) 40.0 and over, adult: Secondary | ICD-10-CM | POA: Diagnosis not present

## 2024-02-10 ENCOUNTER — Telehealth: Payer: Self-pay

## 2024-02-10 NOTE — Telephone Encounter (Signed)
 Chart note or 5/14-5/20 was faxed to [phone] as requested   Copied from CRM [id number]. Topic: Medical Record Request - Provider/Facility Request >> Feb 10, 2024  2:43 PM Avram MATSU wrote: Reason for CRM: Channing from [facility]([place]) office is calling requesting medical records/chart notes from 5/14-5/20   [phone] Fax:[phone]

## 2024-02-11 ENCOUNTER — Telehealth: Payer: Self-pay

## 2024-02-11 ENCOUNTER — Telehealth: Payer: Self-pay | Admitting: Registered Nurse

## 2024-02-11 ENCOUNTER — Ambulatory Visit (INDEPENDENT_AMBULATORY_CARE_PROVIDER_SITE_OTHER)

## 2024-02-11 ENCOUNTER — Ambulatory Visit

## 2024-02-11 ENCOUNTER — Other Ambulatory Visit (HOSPITAL_COMMUNITY): Payer: Self-pay

## 2024-02-11 VITALS — BP 102/64 | HR 70 | Temp 97.9°F | Resp 16 | Ht 64.0 in | Wt 246.2 lb

## 2024-02-11 DIAGNOSIS — Z01818 Encounter for other preprocedural examination: Secondary | ICD-10-CM | POA: Diagnosis not present

## 2024-02-11 DIAGNOSIS — G8929 Other chronic pain: Secondary | ICD-10-CM

## 2024-02-11 DIAGNOSIS — G894 Chronic pain syndrome: Secondary | ICD-10-CM

## 2024-02-11 DIAGNOSIS — E66813 Obesity, class 3: Secondary | ICD-10-CM | POA: Diagnosis not present

## 2024-02-11 DIAGNOSIS — Z8673 Personal history of transient ischemic attack (TIA), and cerebral infarction without residual deficits: Secondary | ICD-10-CM | POA: Diagnosis not present

## 2024-02-11 DIAGNOSIS — M51362 Other intervertebral disc degeneration, lumbar region with discogenic back pain and lower extremity pain: Secondary | ICD-10-CM

## 2024-02-11 DIAGNOSIS — M1711 Unilateral primary osteoarthritis, right knee: Secondary | ICD-10-CM | POA: Diagnosis not present

## 2024-02-11 MED ORDER — FUROSEMIDE 20 MG PO TABS
20.0000 mg | ORAL_TABLET | Freq: Every day | ORAL | 3 refills | Status: AC | PRN
  Filled 2024-02-11: qty 90, 90d supply, fill #0

## 2024-02-11 MED ORDER — OXYCODONE HCL 10 MG PO TABS
10.0000 mg | ORAL_TABLET | Freq: Every day | ORAL | 0 refills | Status: DC | PRN
  Filled 2024-02-11: qty 150, 32d supply, fill #0
  Filled 2024-02-26: qty 150, 30d supply, fill #0

## 2024-02-11 NOTE — Patient Instructions (Signed)
 VISIT SUMMARY:  You came in for a pre-operative evaluation for your upcoming right total knee replacement surgery scheduled for August 18th. We discussed your right knee pain, cardiac history, cerebrovascular disease, and general health maintenance.  YOUR PLAN:  RIGHT KNEE OSTEOARTHRITIS: Severe osteoarthritis causing pain, swelling, and limited range of motion, impacting daily activities. Surgery is scheduled for March 16, 2024. -Proceed with right total knee replacement on March 16, 2024. -Ensure postoperative physical therapy to improve outcomes and prevent stiffness. -Move your extremities post-surgery to prevent blood clots. -Discuss postoperative pain management with the surgical and pain management teams.  CEREBROVASCULAR ACCIDENT (CVA): History of stroke, currently on Plavix  and baby aspirin  for prevention. -Hold Plavix  for five days prior to surgery. -Hold aspirin  for two days prior to surgery.  SUPRAVENTRICULAR TACHYCARDIA (SVT): History of SVT managed with metoprolol  XL 50 mg. No recent symptoms. -Continue metoprolol  XL 50 mg for SVT management.  HYPERLIPIDEMIA: Previously on Crestor  for cholesterol management, currently not on any statin. -Order a cholesterol panel to assess current lipid levels. -Consider restarting Crestor  based on cholesterol panel results.  GENERAL HEALTH MAINTENANCE: Low depression score, no falls in the past year, and normal fasting blood sugars. -Continue with dietitian consultations as needed.  PREOPERATIVE EVALUATION: Preparing for knee replacement surgery, requires preoperative blood work. -Order CBC and basic metabolic profile as preoperative blood work. -Fax blood work results to [facility], [facility].

## 2024-02-11 NOTE — Telephone Encounter (Signed)
 I left a message on the number(s) listed in the patients chart requesting the patient to call back regarding the upcomming appointment for today. The appointment has been canceled. Waiting for the patient to return the call.

## 2024-02-11 NOTE — Telephone Encounter (Signed)
 PMP was Reviewed,  Oxycodone  e-scribed to pharmacy for August to accommodate scheduled appointment. Desiree Russell is aware  via My- Chart message.

## 2024-02-11 NOTE — Progress Notes (Unsigned)
 Subjective:  Patient ID: Desiree Russell, female    DOB: 12-Jul-1971  Age: 53 y.o. MRN: [id number]  Chief Complaint  Patient presents with  . Medical Management of Chronic Issues    HPI:     12/30/2023    3:33 PM 12/17/2023    3:22 PM 11/04/2023    3:11 PM 10/17/2023    3:12 PM 09/26/2023    3:38 PM  Depression screen PHQ 2/9  Decreased Interest 1 0 0 0 0  Down, Depressed, Hopeless 0 0 0 0 0  PHQ - 2 Score 1 0 0 0 0  Altered sleeping   0    Tired, decreased energy   0    Change in appetite   0    Feeling bad or failure about yourself    0    Trouble concentrating   0    Moving slowly or fidgety/restless   0    Suicidal thoughts   0    PHQ-9 Score   0    Difficult doing work/chores   Not difficult at all          12/30/2023    3:32 PM  Fall Risk   Falls in the past year? 0    Patient Care Team: Kindrick Lankford, MD as PCP - General (Family Medicine) Lavona Agent, MD as PCP - Cardiology (Cardiology)   Review of Systems  Constitutional:  Negative for chills, diaphoresis, fatigue and fever.  HENT:  Negative for congestion, ear pain and sinus pain.   Eyes: Negative.   Respiratory:  Negative for cough and shortness of breath.   Cardiovascular:  Negative for chest pain.  Gastrointestinal:  Negative for abdominal pain, constipation, nausea and vomiting.  Endocrine: Negative.   Genitourinary:  Negative for dysuria.  Musculoskeletal:  Positive for gait problem and joint swelling. Negative for arthralgias (right knee pain).  Skin: Negative.   Allergic/Immunologic: Negative.   Neurological:  Negative for weakness and headaches.  Hematological: Negative.   Psychiatric/Behavioral:  Negative for dysphoric mood. The patient is not nervous/anxious.     Current Outpatient Medications on File Prior to Visit  Medication Sig Dispense Refill  . acetaminophen  (TYLENOL ) 325 MG tablet Take 650 mg by mouth every 6 (six) hours as needed for mild pain, fever or headache.    . aspirin  81 MG  EC tablet Take 1 tablet (81 mg total) by mouth daily. 21 tablet 0  . Calcium -Vitamin D -Vitamin K 650-12.5-40 MG-MCG-MCG CHEW Chew 1 tablet by mouth daily.    . cholecalciferol (VITAMIN D3) 25 MCG (1000 UNIT) tablet Take 1,000 Units by mouth daily.    . clopidogrel  (PLAVIX ) 75 MG tablet Take 1 tablet (75 mg total) by mouth once daily 90 tablet 0  . docusate sodium  (COLACE) 100 MG capsule Take 100 mg by mouth 2 (two) times daily as needed for mild constipation or moderate constipation.    . Fezolinetant  (VEOZAH ) 45 MG TABS Take 1 tablet (45 mg total) by mouth daily. 90 tablet 0  . FLUoxetine  (PROZAC ) 20 MG capsule Take 1 capsule (20 mg total) by mouth every evening. 90 capsule 0  . furosemide  (LASIX ) 20 MG tablet Take 1 tablet by mouth daily as needed. 90 tablet 3  . Galcanezumab -gnlm (EMGALITY ) 120 MG/ML SOAJ Inject 120 mg into the skin every 30 (thirty) days. 3 mL 2  . lamoTRIgine  (LAMICTAL ) 25 MG tablet Take 1 tablet (25 mg total) by mouth daily. 60 tablet 5  . linaclotide  (LINZESS ) 290 MCG CAPS  capsule Take 290 mcg by mouth daily before breakfast.    . metoprolol  succinate (TOPROL  XL) 50 MG 24 hr tablet Take 1 tablet (50 mg total) by mouth at bedtime. 90 tablet 3  . Multiple Vitamin (MULTIVITAMIN WITH MINERALS) TABS tablet Take 1 tablet by mouth daily.    . nabumetone  (RELAFEN ) 750 MG tablet Take 1 tablet (750 mg total) by mouth 2 (two) times daily. 60 tablet 1  . OLANZapine  (ZYPREXA ) 5 MG tablet Take 1 tablet (5 mg total) by mouth every evening. 90 tablet 0  . Omega-3 Fatty Acids (OMEGA-3 CF PO) Take 1 capsule by mouth daily at 12 noon.    . pantoprazole  (PROTONIX ) 40 MG tablet Take 1 tablet by mouth daily. 90 tablet 1  . pregabalin  (LYRICA ) 150 MG capsule Take 2 capsules (300 mg total) by mouth at bedtime. 90 capsule 0  . Ubrogepant  (UBRELVY ) 100 MG TABS TAKE  1/2 TABLET BY MOUTH AT HEADACHE ONSET. MAY REPEAT DOSE IN TWO HOURS IF NO IMPROVEMENT. DO NOT EXCEED MORE THAN TWO TABLETS IN 24 HOURS.  (Patient taking differently: Take 100 mg by mouth 2 (two) times daily as needed (migraine). TAKE 1/2 TABLET BY MOUTH AT HEADACHE ONSET. MAY REPEAT DOSE IN TWO HOURS IF NO IMPROVEMENT. DO NOT EXCEED MORE THAN TWO TABLETS IN 24 HOURS.) 18 tablet 1  . valACYclovir  (VALTREX ) 500 MG tablet Take 1 tablet (500 mg total) by mouth 2 (two) times daily for 5 days 10 tablet 1  . vortioxetine  HBr (TRINTELLIX ) 20 MG TABS tablet Take 1 tablet (20 mg total) by mouth daily. 90 tablet 0   No current facility-administered medications on file prior to visit.   Past Medical History:  Diagnosis Date  . Allergy   . Anemia   . Anxiety   . Arthritis   . Depression Lifelong  . Dyslipidemia   . Fibromyalgia   . GERD (gastroesophageal reflux disease)   . Headache   . Hyperlipidemia 2017  . Migraines   . Pericarditis   . Stroke (HCC) 12/2020  . Tachycardia   . Thyroid  disease 2003  . TIA (transient ischemic attack)    Recurrent  . Ulcer Unknown   Past Surgical History:  Procedure Laterality Date  . ABDOMINAL HYSTERECTOMY    . CHOLECYSTECTOMY    . CHOLECYSTECTOMY, LAPAROSCOPIC    . COLONOSCOPY    . KNEE ARTHROSCOPY Right 01/02/2023  . TUBAL LIGATION  2000  . UPPER GASTROINTESTINAL ENDOSCOPY      Family History  Problem Relation Age of Onset  . Hypertension Mother   . Arthritis Mother   . Depression Mother   . Hypertension Father   . Heart attack Father 16  . Anxiety disorder Sister   . Depression Sister   . Healthy Sister   . Healthy Sister   . Hypertension Brother   . Diabetes type II Brother   . Anxiety disorder Brother   . COPD Brother   . COPD Brother   . Healthy Brother   . Healthy Brother   . Breast cancer Maternal Aunt   . Throat cancer Maternal Uncle   . Hypertension Maternal Grandmother   . Arthritis Maternal Grandmother   . Diabetes Paternal Grandmother   . Healthy Daughter   . Healthy Son   . Healthy Son   . Healthy Son   . Hearing loss Son   . Colon cancer Neg Hx   .  Pancreatic cancer Neg Hx   . Stomach cancer Neg Hx   .  Liver disease Neg Hx   . Esophageal cancer Neg Hx   . Rectal cancer Neg Hx    Social History   Socioeconomic History  . Marital status: Married    Spouse name: Not on file  . Number of children: 6  . Years of education: Not on file  . Highest education level: Bachelor's degree (e.g., BA, AB, BS)  Occupational History  . Not on file  Tobacco Use  . Smoking status: Never  . Smokeless tobacco: Never  Vaping Use  . Vaping status: Never Used  Substance and Sexual Activity  . Alcohol use: Yes    Alcohol/week: 1.0 standard drink of alcohol    Types: 1 Glasses of wine per week    Comment: occ   . Drug use: No  . Sexual activity: Yes    Partners: Male    Birth control/protection: Surgical  Other Topics Concern  . 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     Social Drivers of Corporate investment banker Strain: Low Risk  (11/04/2023)   Overall Financial Resource Strain (CARDIA)   . Difficulty of Paying Living Expenses: Not hard at all  Food Insecurity: No Food Insecurity (12/11/2023)   Hunger Vital Sign   . Worried About Programme researcher, broadcasting/film/video in the Last Year: Never true   . Ran Out of Food in the Last Year: Never true  Transportation Needs: No Transportation Needs (12/11/2023)   PRAPARE - Transportation   . Lack of Transportation (Medical): No   . Lack of Transportation (Non-Medical): No  Physical Activity: Sufficiently Active (11/04/2023)   Exercise Vital Sign   . Days of Exercise per Week: 5 days   . Minutes of Exercise per Session: 30 min  Stress: No Stress Concern Present (11/04/2023)   [name] of Occupational Health - Occupational Stress Questionnaire   . Feeling of Stress : Only a little  Social Connections: Moderately Integrated (11/04/2023)   Social Connection and Isolation Panel   . Frequency of Communication with Friends and Family: Three times a week    . Frequency of Social Gatherings with Friends and Family: Once a week   . Attends Religious Services: More than 4 times per year   . Active Member of Clubs or Organizations: No   . Attends Banker Meetings: Not on file   . Marital Status: Married    Objective:  BP 102/64   Pulse 70   Temp 97.9 F (36.6 C) (Temporal)   Resp 16   Ht 5' 4 (1.626 m)   Wt 246 lb 3.2 oz (111.7 kg)   SpO2 96%   BMI 42.26 kg/m      02/11/2024    4:11 PM 01/29/2024    3:30 PM 01/28/2024    3:22 PM  BP/Weight  Systolic BP 102 122 126  Diastolic BP 64 84 80  Wt. (Lbs) 246.2 244 244  BMI 42.26 kg/m2 41.88 kg/m2 41.88 kg/m2    Physical Exam Vitals and nursing note reviewed.     {Perform Simple Foot Exam  Perform Detailed exam:1} {Insert foot Exam (Optional):30965}   Lab Results  Component Value Date   WBC 4.5 12/12/2023   HGB 12.7 12/12/2023   HCT 39.0 12/12/2023   PLT 200 12/12/2023   GLUCOSE 82 12/12/2023   CHOL 137 08/16/2022   TRIG 55 08/16/2022   HDL 47 08/16/2022   LDLCALC 78 08/16/2022  ALT 18 12/11/2023   AST 22 12/11/2023   NA 138 12/12/2023   K 4.1 12/12/2023   CL 106 12/12/2023   CREATININE 0.98 12/12/2023   BUN 10 12/12/2023   CO2 25 12/12/2023   TSH 0.450 08/16/2022   INR 1.1 12/11/2023   HGBA1C 5.5 07/14/2021      Assessment & Plan:  There are no diagnoses linked to this encounter.   No orders of the defined types were placed in this encounter.   No orders of the defined types were placed in this encounter.    Follow-up: No follow-ups on file.   I,Angela Taylor,acting as a Neurosurgeon for Fidelis Loth, MD.,have documented all relevant documentation on the behalf of Maxum Cassarino, MD,as directed by  Marnell Mcdaniel, MD while in the presence of Tommy Schimke, MD.   An After Visit Summary was printed and given to the patient.  Vinny Taranto, MD [facility] [phone]

## 2024-02-12 ENCOUNTER — Ambulatory Visit: Payer: Self-pay

## 2024-02-12 ENCOUNTER — Other Ambulatory Visit (HOSPITAL_COMMUNITY): Payer: Self-pay

## 2024-02-12 LAB — CBC WITH DIFFERENTIAL/PLATELET
Basophils Absolute: 0 x10E3/uL (ref 0.0–0.2)
Basos: 1 %
EOS (ABSOLUTE): 0.1 x10E3/uL (ref 0.0–0.4)
Eos: 2 %
Hematocrit: 36.6 % (ref 34.0–46.6)
Hemoglobin: 11.6 g/dL (ref 11.1–15.9)
Immature Grans (Abs): 0 x10E3/uL (ref 0.0–0.1)
Immature Granulocytes: 0 %
Lymphocytes Absolute: 2.6 x10E3/uL (ref 0.7–3.1)
Lymphs: 50 %
MCH: 29.5 pg (ref 26.6–33.0)
MCHC: 31.7 g/dL (ref 31.5–35.7)
MCV: 93 fL (ref 79–97)
Monocytes Absolute: 0.5 x10E3/uL (ref 0.1–0.9)
Monocytes: 9 %
Neutrophils Absolute: 2 x10E3/uL (ref 1.4–7.0)
Neutrophils: 38 %
Platelets: 210 x10E3/uL (ref 150–450)
RBC: 3.93 x10E6/uL (ref 3.77–5.28)
RDW: 12.2 % (ref 11.7–15.4)
WBC: 5.3 x10E3/uL (ref 3.4–10.8)

## 2024-02-12 LAB — LIPID PANEL
Chol/HDL Ratio: 4.3 ratio (ref 0.0–4.4)
Cholesterol, Total: 196 mg/dL (ref 100–199)
HDL: 46 mg/dL (ref >39)
LDL Chol Calc (NIH): 130 mg/dL — ABNORMAL HIGH (ref 0–99)
Triglycerides: 109 mg/dL (ref 0–149)
VLDL Cholesterol Cal: 20 mg/dL (ref 5–40)

## 2024-02-12 LAB — COMPREHENSIVE METABOLIC PANEL WITH GFR
ALT: 11 IU/L (ref 0–32)
AST: 17 IU/L (ref 0–40)
Albumin: 4.2 g/dL (ref 3.9–4.9)
Alkaline Phosphatase: 53 IU/L (ref 44–121)
BUN/Creatinine Ratio: 12 (ref 9–23)
BUN: 13 mg/dL (ref 6–24)
Bilirubin Total: <0.2 mg/dL (ref 0.0–1.2)
CO2: 21 mmol/L (ref 20–29)
Calcium: 9.3 mg/dL (ref 8.7–10.2)
Chloride: 104 mmol/L (ref 96–106)
Creatinine, Ser: 1.06 mg/dL — ABNORMAL HIGH (ref 0.57–1.00)
Globulin, Total: 2.2 g/dL (ref 1.5–4.5)
Glucose: 82 mg/dL (ref 70–99)
Potassium: 3.9 mmol/L (ref 3.5–5.2)
Sodium: 140 mmol/L (ref 134–144)
Total Protein: 6.4 g/dL (ref 6.0–8.5)
eGFR: 64 mL/min/1.73 (ref >59)

## 2024-02-12 MED ORDER — ROSUVASTATIN CALCIUM 10 MG PO TABS
10.0000 mg | ORAL_TABLET | Freq: Every day | ORAL | 3 refills | Status: DC
  Filled 2024-02-12: qty 90, 90d supply, fill #0

## 2024-02-12 NOTE — Telephone Encounter (Signed)
Sent Crestor to pharmacy.  ? ?

## 2024-02-12 NOTE — Assessment & Plan Note (Signed)
 Severe osteoarthritis causing pain, swelling, and limited range of motion, impacting activities such as shopping and driving. Scheduled for right total knee replacement on March 16, 2024. Engages in daily exercise using a Peloton bike to maintain mobility and strength preoperatively. Surgery will be under spinal anesthesia to reduce risk.

## 2024-02-12 NOTE — Assessment & Plan Note (Addendum)
 Patient' s GUPTA preoperative cardiac risk score was 0.1% which is LOW. REVISED CARDIAC RISK INDEX was 1 point with 6% risk. Overall, she is at moderately low risk for this knee replacement surgery being planned under Spinal anesthesia.  Discussed post operative pain management- defer to ortho. Post operative DVT prophylaxis- defer to ortho. SHE COULD RESUME HER ASPIRIN  AND PLAVIX  AFTER SURGERY. She is recommended to HOLD ASPIRIN  FOR 2 DAYS AND PLAVIX  FOR 5 DAYS prior to surgery.   Explained to her that Postoperative risks include blood clots, mitigated by extremity movement and compression stockings.   Physical therapy is crucial post-surgery to prevent stiffness and ensure success. Ensure postoperative physical therapy to improve outcomes and prevent stiffness.-  Educate on the importance of moving extremities post-surgery to prevent blood clots. - Discuss postoperative pain management with the surgical and pain management teams.  Cerebrovascular Accident (CVA)On lifelong Plavix  and baby aspirin  for secondary prevention. Neurology advised holding Plavix  for five days and aspirin  for two days prior to knee surgery to reduce bleeding risk.- Hold Plavix  for five days prior to surgery.- Hold aspirin  for two days prior to surgery.   Preoperative Evaluation. Preparing for knee replacement surgery, requires preoperative blood work. Ensure results are available to the surgical team at [facility], [facility].   Blood work includes CBC and basic metabolic profile.   No need for additional tests such as A1c, urine analysis, PT, INR, chest x-ray, or EKG due to low surgical risk and absence of cardiac history. Moreover last EKG was normal in March 2025  - Order CBC and basic metabolic profile as preoperative blood work.- Fax blood work results to [facility], [facility].

## 2024-02-12 NOTE — Assessment & Plan Note (Signed)
 Follows up with pain clinic for chronic low back pain management. On opioid pain medications through pain clinic. Advised that she may have to continue the same medications for post op pain.

## 2024-02-14 ENCOUNTER — Ambulatory Visit (HOSPITAL_COMMUNITY): Attending: Gastroenterology

## 2024-02-15 DIAGNOSIS — Z6841 Body Mass Index (BMI) 40.0 and over, adult: Secondary | ICD-10-CM | POA: Diagnosis not present

## 2024-02-15 DIAGNOSIS — Z713 Dietary counseling and surveillance: Secondary | ICD-10-CM | POA: Diagnosis not present

## 2024-02-17 ENCOUNTER — Ambulatory Visit (HOSPITAL_COMMUNITY)

## 2024-02-22 DIAGNOSIS — Z713 Dietary counseling and surveillance: Secondary | ICD-10-CM | POA: Diagnosis not present

## 2024-02-22 DIAGNOSIS — Z6841 Body Mass Index (BMI) 40.0 and over, adult: Secondary | ICD-10-CM | POA: Diagnosis not present

## 2024-02-24 ENCOUNTER — Other Ambulatory Visit (HOSPITAL_COMMUNITY): Payer: Self-pay

## 2024-02-24 MED ORDER — DICLOFENAC SODIUM 75 MG PO TBEC
75.0000 mg | DELAYED_RELEASE_TABLET | Freq: Two times a day (BID) | ORAL | 0 refills | Status: DC | PRN
  Filled 2024-02-24: qty 60, 30d supply, fill #0

## 2024-02-26 ENCOUNTER — Other Ambulatory Visit: Payer: Self-pay

## 2024-02-29 DIAGNOSIS — Z713 Dietary counseling and surveillance: Secondary | ICD-10-CM | POA: Diagnosis not present

## 2024-02-29 DIAGNOSIS — Z6841 Body Mass Index (BMI) 40.0 and over, adult: Secondary | ICD-10-CM | POA: Diagnosis not present

## 2024-03-06 ENCOUNTER — Other Ambulatory Visit: Payer: Self-pay | Admitting: Family Medicine

## 2024-03-06 ENCOUNTER — Other Ambulatory Visit (HOSPITAL_COMMUNITY): Payer: Self-pay

## 2024-03-06 DIAGNOSIS — M1711 Unilateral primary osteoarthritis, right knee: Secondary | ICD-10-CM | POA: Diagnosis not present

## 2024-03-06 DIAGNOSIS — M25561 Pain in right knee: Secondary | ICD-10-CM | POA: Diagnosis not present

## 2024-03-06 DIAGNOSIS — G8929 Other chronic pain: Secondary | ICD-10-CM | POA: Diagnosis not present

## 2024-03-06 MED ORDER — EZETIMIBE 10 MG PO TABS
10.0000 mg | ORAL_TABLET | Freq: Every day | ORAL | 0 refills | Status: DC
  Filled 2024-03-06: qty 90, 90d supply, fill #0

## 2024-03-07 DIAGNOSIS — Z713 Dietary counseling and surveillance: Secondary | ICD-10-CM | POA: Diagnosis not present

## 2024-03-07 DIAGNOSIS — Z6841 Body Mass Index (BMI) 40.0 and over, adult: Secondary | ICD-10-CM | POA: Diagnosis not present

## 2024-03-10 ENCOUNTER — Other Ambulatory Visit: Payer: Self-pay | Admitting: Physician Assistant

## 2024-03-10 ENCOUNTER — Other Ambulatory Visit (HOSPITAL_COMMUNITY): Payer: Self-pay

## 2024-03-10 MED ORDER — EZETIMIBE 10 MG PO TABS
10.0000 mg | ORAL_TABLET | Freq: Every day | ORAL | 1 refills | Status: DC
  Filled 2024-03-10 – 2024-06-02 (×2): qty 90, 90d supply, fill #0

## 2024-03-11 ENCOUNTER — Encounter: Attending: Physical Medicine & Rehabilitation | Admitting: Registered Nurse

## 2024-03-11 ENCOUNTER — Encounter: Payer: Self-pay | Admitting: Registered Nurse

## 2024-03-11 ENCOUNTER — Other Ambulatory Visit (HOSPITAL_COMMUNITY): Payer: Self-pay

## 2024-03-11 VITALS — BP 118/78 | HR 67 | Ht 64.0 in | Wt 240.4 lb

## 2024-03-11 DIAGNOSIS — M25551 Pain in right hip: Secondary | ICD-10-CM | POA: Insufficient documentation

## 2024-03-11 DIAGNOSIS — G8929 Other chronic pain: Secondary | ICD-10-CM | POA: Insufficient documentation

## 2024-03-11 DIAGNOSIS — Z5181 Encounter for therapeutic drug level monitoring: Secondary | ICD-10-CM | POA: Insufficient documentation

## 2024-03-11 DIAGNOSIS — M1711 Unilateral primary osteoarthritis, right knee: Secondary | ICD-10-CM | POA: Diagnosis not present

## 2024-03-11 DIAGNOSIS — G894 Chronic pain syndrome: Secondary | ICD-10-CM | POA: Insufficient documentation

## 2024-03-11 DIAGNOSIS — Z79891 Long term (current) use of opiate analgesic: Secondary | ICD-10-CM | POA: Insufficient documentation

## 2024-03-11 DIAGNOSIS — M545 Low back pain, unspecified: Secondary | ICD-10-CM | POA: Diagnosis not present

## 2024-03-11 MED ORDER — OXYCODONE HCL 10 MG PO TABS
10.0000 mg | ORAL_TABLET | Freq: Every day | ORAL | 0 refills | Status: DC | PRN
Start: 2024-03-11 — End: 2024-04-08
  Filled 2024-03-11 – 2024-03-23 (×3): qty 150, 32d supply, fill #0
  Filled 2024-03-26: qty 150, 30d supply, fill #0

## 2024-03-11 NOTE — Progress Notes (Signed)
 Subjective:    Patient ID: Desiree Russell, female    DOB: [birth year], 53 y.o.   MRN: [id number]  HPI: Desiree Russell is a 53 y.o. female who returns for follow up appointment for chronic pain and medication refill. She states her pain is located in her lower back and right knee. She rates her pain 7. Her current exercise regime is walking and performing stretching exercises.  Desiree Russell Morphine  equivalent is 75.00 MME.   Last Oral Swab was Performed on 12/30/2023, it was consistent.     Pain Inventory Average Pain 5 Pain Right Now 7 My pain is constant and aching  In the last 24 hours, has pain interfered with the following? General activity 6 Relation with others 1 Enjoyment of life 8 What TIME of day is your pain at its worst? morning  and night Sleep (in general) NA  Pain is worse with: walking, bending, standing, and some activites Pain improves with: rest, heat/ice, and medication Relief from Meds: 5  Family History  Problem Relation Age of Onset   Hypertension Mother    Arthritis Mother    Depression Mother    Hypertension Father    Heart attack Father 59   Anxiety disorder Sister    Depression Sister    Healthy Sister    Healthy Sister    Hypertension Brother    Diabetes type II Brother    Anxiety disorder Brother    COPD Brother    COPD Brother    Healthy Brother    Healthy Brother    Breast cancer Maternal Aunt    Throat cancer Maternal Uncle    Hypertension Maternal Grandmother    Arthritis Maternal Grandmother    Diabetes Paternal Grandmother    Healthy Daughter    Healthy Son    Healthy Son    Healthy Son    Hearing loss Son    Colon cancer Neg Hx    Pancreatic cancer Neg Hx    Stomach cancer Neg Hx    Liver disease Neg Hx    Esophageal cancer Neg Hx    Rectal cancer Neg Hx    Social History   Socioeconomic History   Marital status: Married    Spouse name: Not on file   Number of children: 6   Years of education: Not on file   Highest  education level: Bachelor's degree (e.g., BA, AB, BS)  Occupational History   Not on file  Tobacco Use   Smoking status: Never   Smokeless tobacco: Never  Vaping Use   Vaping status: Never Used  Substance and Sexual Activity   Alcohol use: Yes    Alcohol/week: 1.0 standard drink of alcohol    Types: 1 Glasses of wine per week    Comment: occ    Drug use: No   Sexual activity: Yes    Partners: Male    Birth control/protection: Surgical  Other Topics Concern   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     Social Drivers of Corporate investment banker Strain: Low Risk  (11/04/2023)   Overall Financial Resource Strain (CARDIA)    Difficulty of Paying Living Expenses: Not hard at all  Food Insecurity: No Food Insecurity (12/11/2023)   Hunger Vital Sign    Worried About Running Out of Food in the Last Year: Never true    Ran Out of Food in the Last Year: Never  true  Transportation Needs: No Transportation Needs (12/11/2023)   PRAPARE - Administrator, Civil Service (Medical): No    Lack of Transportation (Non-Medical): No  Physical Activity: Sufficiently Active (11/04/2023)   Exercise Vital Sign    Days of Exercise per Week: 5 days    Minutes of Exercise per Session: 30 min  Stress: No Stress Concern Present (11/04/2023)   [name] of Occupational Health - Occupational Stress Questionnaire    Feeling of Stress : Only a little  Social Connections: Moderately Integrated (11/04/2023)   Social Connection and Isolation Panel    Frequency of Communication with Friends and Family: Three times a week    Frequency of Social Gatherings with Friends and Family: Once a week    Attends Religious Services: More than 4 times per year    Active Member of Clubs or Organizations: No    Attends Engineer, structural: Not on file    Marital Status: Married   Past Surgical History:  Procedure Laterality Date    ABDOMINAL HYSTERECTOMY     CHOLECYSTECTOMY     CHOLECYSTECTOMY, LAPAROSCOPIC     COLONOSCOPY     KNEE ARTHROSCOPY Right 01/02/2023   TUBAL LIGATION  2000   UPPER GASTROINTESTINAL ENDOSCOPY     Past Surgical History:  Procedure Laterality Date   ABDOMINAL HYSTERECTOMY     CHOLECYSTECTOMY     CHOLECYSTECTOMY, LAPAROSCOPIC     COLONOSCOPY     KNEE ARTHROSCOPY Right 01/02/2023   TUBAL LIGATION  2000   UPPER GASTROINTESTINAL ENDOSCOPY     Past Medical History:  Diagnosis Date   Allergy    Anemia    Anxiety    Arthritis    Depression Lifelong   Dyslipidemia    Fibromyalgia    GERD (gastroesophageal reflux disease)    Headache    Hyperlipidemia 2017   Migraines    Pericarditis    Stroke (HCC) 12/2020   Tachycardia    Thyroid  disease 2003   TIA (transient ischemic attack)    Recurrent   Ulcer Unknown   BP 118/78   Pulse 67   Ht 5' 4 (1.626 m)   Wt 240 lb 6.4 oz (109 kg)   SpO2 93%   BMI 41.26 kg/m   Opioid Risk Score:   Fall Risk Score:  `1  Depression screen [facility] 2/9     03/11/2024   11:01 AM 12/30/2023    3:33 PM 12/17/2023    3:22 PM 11/04/2023    3:11 PM 10/17/2023    3:12 PM 09/26/2023    3:38 PM 09/12/2023   12:57 PM  Depression screen PHQ 2/9  Decreased Interest 0 1 0 0 0 0 0  Down, Depressed, Hopeless 0 0 0 0 0 0 0  PHQ - 2 Score 0 1 0 0 0 0 0  Altered sleeping    0     Tired, decreased energy    0     Change in appetite    0     Feeling bad or failure about yourself     0     Trouble concentrating    0     Moving slowly or fidgety/restless    0     Suicidal thoughts    0     PHQ-9 Score    0     Difficult doing work/chores    Not difficult at all       Review of Systems  Musculoskeletal:  Positive for back pain.       Right knee  All other systems reviewed and are negative.      Objective:   Physical Exam Vitals and nursing note reviewed.  Constitutional:      Appearance: Normal appearance.  Cardiovascular:     Rate and Rhythm: Normal  rate and regular rhythm.     Pulses: Normal pulses.     Heart sounds: Normal heart sounds.  Pulmonary:     Effort: Pulmonary effort is normal.     Breath sounds: Normal breath sounds.  Musculoskeletal:     Comments: Normal Muscle Bulk and Muscle Testing Reveals:  Upper Extremities: Full ROM and Muscle Strength 5/5  Lumbar Paraspinal Tenderness: L-3-L-5 Lower Extremities: Right: Decreased ROM and Muscle Strength 5/5 Right Lower Extremity Flexion Produces Pain into her Right Patella Left Lower Extremity: Full ROM and Muscle Strength 5/5 Arises from Table slowly Antalgic Gait     Skin:    General: Skin is warm and dry.  Neurological:     Mental Status: She is alert and oriented to person, place, and time.  Psychiatric:        Mood and Affect: Mood normal.        Behavior: Behavior normal.          Assessment & Plan:  Lumbo Sacral Spondylosis: Chronic Bilateral Low Back Pain: Continue HEP as Tolerated. Continue to Monitor. S/P On 09/12/2023, with Dr Carilyn.  Bilateral Lumbar L3, L4 medial branch blocks and L 5 dorsal ramus injection under fluoroscopic guidance . Good relief noted. 03/11/2024 2. Chronic Right Hip Pain/ Bilateral Greater Trochanter Bursitis:  Continue HEP as Tolerated. Continue to Monitor. 03/11/2024 3. Primary Osteoarthritis Right Knee: ortho Following. She is scheduled for Right knee surgery in August, she reports. Continue to Monitor. 03/11/2024 4. Chronic Pain Syndrome: Refilled:  Oxycodone  10 mg one tablet 5 times a day as needed for pain #150 We will continue the opioid monitoring program, this consists of regular clinic visits, examinations, urine drug screen, pill counts as well as use of [place]  Controlled Substance Reporting system. A 12 month History has been reviewed on the [place]  Controlled Substance Reporting System on 03/11/2024.    F/U in 1 month

## 2024-03-13 ENCOUNTER — Ambulatory Visit: Admitting: Registered Nurse

## 2024-03-14 DIAGNOSIS — Z713 Dietary counseling and surveillance: Secondary | ICD-10-CM | POA: Diagnosis not present

## 2024-03-14 DIAGNOSIS — Z6841 Body Mass Index (BMI) 40.0 and over, adult: Secondary | ICD-10-CM | POA: Diagnosis not present

## 2024-03-16 ENCOUNTER — Telehealth (HOSPITAL_BASED_OUTPATIENT_CLINIC_OR_DEPARTMENT_OTHER): Admitting: Psychiatry

## 2024-03-16 ENCOUNTER — Encounter (HOSPITAL_COMMUNITY): Payer: Self-pay | Admitting: Psychiatry

## 2024-03-16 ENCOUNTER — Other Ambulatory Visit (HOSPITAL_COMMUNITY): Payer: Self-pay

## 2024-03-16 ENCOUNTER — Telehealth (HOSPITAL_COMMUNITY): Payer: Self-pay | Admitting: Psychiatry

## 2024-03-16 ENCOUNTER — Other Ambulatory Visit: Payer: Self-pay

## 2024-03-16 VITALS — Wt 240.0 lb

## 2024-03-16 DIAGNOSIS — F419 Anxiety disorder, unspecified: Secondary | ICD-10-CM

## 2024-03-16 DIAGNOSIS — F4310 Post-traumatic stress disorder, unspecified: Secondary | ICD-10-CM

## 2024-03-16 DIAGNOSIS — G8918 Other acute postprocedural pain: Secondary | ICD-10-CM | POA: Diagnosis not present

## 2024-03-16 DIAGNOSIS — F331 Major depressive disorder, recurrent, moderate: Secondary | ICD-10-CM | POA: Diagnosis not present

## 2024-03-16 DIAGNOSIS — M25761 Osteophyte, right knee: Secondary | ICD-10-CM | POA: Diagnosis not present

## 2024-03-16 DIAGNOSIS — M1711 Unilateral primary osteoarthritis, right knee: Secondary | ICD-10-CM | POA: Diagnosis not present

## 2024-03-16 MED ORDER — METHOCARBAMOL 500 MG PO TABS
500.0000 mg | ORAL_TABLET | Freq: Four times a day (QID) | ORAL | 0 refills | Status: DC
  Filled 2024-03-16: qty 60, 8d supply, fill #0

## 2024-03-16 MED ORDER — ASPIRIN 81 MG PO TBEC
81.0000 mg | DELAYED_RELEASE_TABLET | Freq: Two times a day (BID) | ORAL | 0 refills | Status: DC
  Filled 2024-03-16: qty 60, 30d supply, fill #0

## 2024-03-16 MED ORDER — ONDANSETRON 4 MG PO TBDP
4.0000 mg | ORAL_TABLET | Freq: Three times a day (TID) | ORAL | 0 refills | Status: DC
  Filled 2024-03-16: qty 20, 7d supply, fill #0

## 2024-03-16 MED ORDER — FLUOXETINE HCL 20 MG PO CAPS
20.0000 mg | ORAL_CAPSULE | Freq: Every evening | ORAL | 0 refills | Status: DC
  Filled 2024-03-16: qty 90, 90d supply, fill #0

## 2024-03-16 MED ORDER — OLANZAPINE 5 MG PO TABS
5.0000 mg | ORAL_TABLET | Freq: Every evening | ORAL | 0 refills | Status: AC
Start: 2024-03-16 — End: ?
  Filled 2024-03-16: qty 90, 90d supply, fill #0

## 2024-03-16 MED ORDER — LAMOTRIGINE 25 MG PO TABS
50.0000 mg | ORAL_TABLET | Freq: Every day | ORAL | 1 refills | Status: DC
  Filled 2024-03-16: qty 60, 30d supply, fill #0
  Filled 2024-04-13: qty 60, 30d supply, fill #1

## 2024-03-16 MED ORDER — CELECOXIB 200 MG PO CAPS
200.0000 mg | ORAL_CAPSULE | Freq: Two times a day (BID) | ORAL | 0 refills | Status: DC
  Filled 2024-03-16: qty 60, 30d supply, fill #0

## 2024-03-16 MED ORDER — DOCUSATE SODIUM 100 MG PO CAPS
100.0000 mg | ORAL_CAPSULE | Freq: Two times a day (BID) | ORAL | 0 refills | Status: AC
  Filled 2024-03-16: qty 30, 15d supply, fill #0

## 2024-03-16 MED ORDER — OXYCODONE HCL 5 MG PO TABS
5.0000 mg | ORAL_TABLET | Freq: Four times a day (QID) | ORAL | 0 refills | Status: DC | PRN
  Filled 2024-03-16 – 2024-03-22 (×2): qty 40, 5d supply, fill #0

## 2024-03-16 MED ORDER — VORTIOXETINE HBR 20 MG PO TABS
20.0000 mg | ORAL_TABLET | Freq: Every day | ORAL | 0 refills | Status: DC
  Filled 2024-03-16 – 2024-04-21 (×2): qty 90, 90d supply, fill #0
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
## 2024-03-17 ENCOUNTER — Other Ambulatory Visit (HOSPITAL_COMMUNITY): Payer: Self-pay

## 2024-03-18 ENCOUNTER — Other Ambulatory Visit (HOSPITAL_COMMUNITY): Payer: Self-pay

## 2024-03-18 MED ORDER — HYDROMORPHONE HCL 2 MG PO TABS
2.0000 mg | ORAL_TABLET | Freq: Four times a day (QID) | ORAL | 0 refills | Status: DC | PRN
  Filled 2024-03-18: qty 29, 4d supply, fill #0
  Filled 2024-03-18: qty 11, 1d supply, fill #0

## 2024-03-22 ENCOUNTER — Other Ambulatory Visit: Payer: Self-pay | Admitting: Gastroenterology

## 2024-03-22 ENCOUNTER — Other Ambulatory Visit: Payer: Self-pay

## 2024-03-22 DIAGNOSIS — K2100 Gastro-esophageal reflux disease with esophagitis, without bleeding: Secondary | ICD-10-CM

## 2024-03-23 ENCOUNTER — Other Ambulatory Visit (HOSPITAL_COMMUNITY): Payer: Self-pay

## 2024-03-23 MED ORDER — PANTOPRAZOLE SODIUM 40 MG PO TBEC
40.0000 mg | DELAYED_RELEASE_TABLET | Freq: Every day | ORAL | 0 refills | Status: DC
  Filled 2024-03-23: qty 90, 90d supply, fill #0

## 2024-03-23 MED ORDER — PREGABALIN 150 MG PO CAPS
300.0000 mg | ORAL_CAPSULE | Freq: Every day | ORAL | 0 refills | Status: DC
  Filled 2024-03-23: qty 90, 45d supply, fill #0

## 2024-03-25 ENCOUNTER — Other Ambulatory Visit (HOSPITAL_COMMUNITY): Payer: Self-pay

## 2024-03-26 ENCOUNTER — Other Ambulatory Visit (HOSPITAL_COMMUNITY): Payer: Self-pay

## 2024-03-26 DIAGNOSIS — Z96651 Presence of right artificial knee joint: Secondary | ICD-10-CM | POA: Diagnosis not present

## 2024-03-26 DIAGNOSIS — M25661 Stiffness of right knee, not elsewhere classified: Secondary | ICD-10-CM | POA: Diagnosis not present

## 2024-03-26 DIAGNOSIS — M25561 Pain in right knee: Secondary | ICD-10-CM | POA: Diagnosis not present

## 2024-03-26 DIAGNOSIS — R2689 Other abnormalities of gait and mobility: Secondary | ICD-10-CM | POA: Diagnosis not present

## 2024-03-26 MED ORDER — HYDROMORPHONE HCL 2 MG PO TABS
2.0000 mg | ORAL_TABLET | Freq: Four times a day (QID) | ORAL | 0 refills | Status: DC | PRN
  Filled 2024-03-26: qty 40, 5d supply, fill #0

## 2024-03-26 MED ORDER — CELECOXIB 200 MG PO CAPS
200.0000 mg | ORAL_CAPSULE | Freq: Two times a day (BID) | ORAL | 0 refills | Status: DC
  Filled 2024-03-26 – 2024-04-10 (×3): qty 60, 30d supply, fill #0

## 2024-03-26 MED ORDER — METHOCARBAMOL 500 MG PO TABS
500.0000 mg | ORAL_TABLET | Freq: Four times a day (QID) | ORAL | 0 refills | Status: DC
  Filled 2024-03-26: qty 60, 8d supply, fill #0

## 2024-03-28 DIAGNOSIS — Z6841 Body Mass Index (BMI) 40.0 and over, adult: Secondary | ICD-10-CM | POA: Diagnosis not present

## 2024-03-28 DIAGNOSIS — Z713 Dietary counseling and surveillance: Secondary | ICD-10-CM | POA: Diagnosis not present

## 2024-03-31 ENCOUNTER — Ambulatory Visit (INDEPENDENT_AMBULATORY_CARE_PROVIDER_SITE_OTHER)

## 2024-03-31 VITALS — BP 120/82 | HR 82 | Temp 98.2°F | Ht 64.0 in | Wt 239.0 lb

## 2024-03-31 DIAGNOSIS — M1711 Unilateral primary osteoarthritis, right knee: Secondary | ICD-10-CM | POA: Diagnosis not present

## 2024-03-31 DIAGNOSIS — Z1231 Encounter for screening mammogram for malignant neoplasm of breast: Secondary | ICD-10-CM

## 2024-03-31 DIAGNOSIS — Z0000 Encounter for general adult medical examination without abnormal findings: Secondary | ICD-10-CM

## 2024-03-31 NOTE — Patient Instructions (Signed)
  VISIT SUMMARY: Today, you had a wellness exam following your right knee replacement. We discussed your ongoing knee pain, postmenopausal symptoms, headache management, depressive symptoms, and other health concerns. Your vital signs and lab results were normal, and we reviewed your current medications and treatments.  YOUR PLAN: RIGHT KNEE PAIN POST-REPLACEMENT: You are recovering from a right knee replacement surgery with some ongoing pain and pitting edema. -Continue physical therapy three times a week. -Try to reduce the use of Rehazomorphone to avoid constipation.  POSTMENOPAUSAL SYMPTOMS: You are experiencing postmenopausal symptoms that are well-managed with Veozah . -Continue taking Veozah  as prescribed. Make sure not to miss doses to prevent hot flashes.  HEADACHE MANAGEMENT: You are managing your migraines with [facility] . -Continue follow-up with pain management.  DEPRESSION: Your depressive symptoms are well-managed with Trintellix  20 mg daily. -Continue taking Trintellix  as prescribed.  HYPERLIPIDEMIA WITH STATIN INTOLERANCE: You have high cholesterol and cannot tolerate statins due to joint and back pain. You are on an alternative medication. -Continue taking your current cholesterol medication.  GENERAL HEALTH MAINTENANCE: Routine wellness exam with normal vital signs and satisfactory lab results. No smoking or significant alcohol consumption. Up to date with dental and eye exams. -Order a mammogram for October on the mammogram bus. -Get the shingles vaccine series at the pharmacy. -Consider getting the flu vaccine later this week.                      Contains text generated by [employer].                                 Contains text generated by [employer].

## 2024-03-31 NOTE — Progress Notes (Unsigned)
 Subjective:  Patient ID: Desiree Russell, female    DOB: 11/11/1970  Age: 53 y.o. MRN: [id number]  Chief Complaint  Patient presents with  . Annual Exam    Well Adult Physical: Patient here for a comprehensive physical exam.The patient reports no problems Do you take any herbs or supplements that were not prescribed by a doctor? Yes  Are you taking calcium  supplements? yes Are you taking aspirin  daily? yes  Encounter for general adult medical examination without abnormal findings  Physical (At Risk items are starred): Patient's last physical exam was 1 year ago .  Patient is not afflicted from Stress Incontinence and Urge Incontinence  Patient wears a seat belts Patient has smoke detectors and has carbon monoxide detectors. Patient practices appropriate gun safety. Patient wears sunscreen with extended sun exposure. Dental Care: biannual cleanings, brushes and flosses daily. Ophthalmology/Optometry: Annual visit.  Hearing loss: none Vision impairments: none   Menstrual History: hysterectomy  Pregnancy history: G5P4 Safe at home: Yes Self breast exams: Yes     03/31/2024    1:13 PM 03/11/2024   11:01 AM 12/30/2023    3:33 PM 12/17/2023    3:22 PM 11/04/2023    3:11 PM  Depression screen PHQ 2/9  Decreased Interest 0 0 1 0 0  Down, Depressed, Hopeless 0 0 0 0 0  PHQ - 2 Score 0 0 1 0 0  Altered sleeping     0  Tired, decreased energy     0  Change in appetite     0  Feeling bad or failure about yourself      0  Trouble concentrating     0  Moving slowly or fidgety/restless     0  Suicidal thoughts     0  PHQ-9 Score     0  Difficult doing work/chores     Not difficult at all         10/17/2023    3:12 PM 12/17/2023    3:22 PM 12/30/2023    3:32 PM 03/11/2024   11:01 AM 03/31/2024    1:12 PM  Fall Risk  Falls in the past year? 0 1 0 0 1  Was there an injury with Fall?  0   0  Fall Risk Category Calculator  2   2  Patient at Risk for Falls Due to  No Fall Risks   History  of fall(s)  Fall risk Follow up     Falls evaluation completed             Social Hx   Social History   Socioeconomic History  . Marital status: Married    Spouse name: Not on file  . Number of children: 6  . Years of education: Not on file  . Highest education level: Bachelor's degree (e.g., BA, AB, BS)  Occupational History  . Not on file  Tobacco Use  . Smoking status: Never  . Smokeless tobacco: Never  Vaping Use  . Vaping status: Never Used  Substance and Sexual Activity  . Alcohol use: Yes    Alcohol/week: 1.0 standard drink of alcohol    Types: 1 Glasses of wine per week    Comment: occ   . Drug use: No  . Sexual activity: Yes    Partners: Male    Birth control/protection: Surgical  Other Topics Concern  . 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  Social Drivers of Corporate investment banker Strain: Low Risk  (11/04/2023)   Overall Financial Resource Strain (CARDIA)   . Difficulty of Paying Living Expenses: Not hard at all  Food Insecurity: No Food Insecurity (12/11/2023)   Hunger Vital Sign   . Worried About Programme researcher, broadcasting/film/video in the Last Year: Never true   . Ran Out of Food in the Last Year: Never true  Transportation Needs: No Transportation Needs (12/11/2023)   PRAPARE - Transportation   . Lack of Transportation (Medical): No   . Lack of Transportation (Non-Medical): No  Physical Activity: Sufficiently Active (11/04/2023)   Exercise Vital Sign   . Days of Exercise per Week: 5 days   . Minutes of Exercise per Session: 30 min  Stress: No Stress Concern Present (11/04/2023)   [name] of Occupational Health - Occupational Stress Questionnaire   . Feeling of Stress : Only a little  Social Connections: Moderately Integrated (11/04/2023)   Social Connection and Isolation Panel   . Frequency of Communication with Friends and Family: Three times a week   . Frequency of Social Gatherings with  Friends and Family: Once a week   . Attends Religious Services: More than 4 times per year   . Active Member of Clubs or Organizations: No   . Attends Banker Meetings: Not on file   . Marital Status: Married   Past Medical History:  Diagnosis Date  . Allergy   . Anemia   . Anxiety   . Arthritis   . Depression Lifelong  . Dyslipidemia   . Fibromyalgia   . GERD (gastroesophageal reflux disease)   . Headache   . Hyperlipidemia 2017  . Migraines   . Pericarditis   . Stroke (HCC) 12/2020  . Tachycardia   . Thyroid  disease 2003  . TIA (transient ischemic attack)    Recurrent  . Ulcer Unknown   Past Surgical History:  Procedure Laterality Date  . ABDOMINAL HYSTERECTOMY    . CHOLECYSTECTOMY    . CHOLECYSTECTOMY, LAPAROSCOPIC    . COLONOSCOPY    . KNEE ARTHROSCOPY Right 01/02/2023  . TUBAL LIGATION  2000  . UPPER GASTROINTESTINAL ENDOSCOPY      Family History  Problem Relation Age of Onset  . Hypertension Mother   . Arthritis Mother   . Depression Mother   . Hypertension Father   . Heart attack Father 39  . Anxiety disorder Sister   . Depression Sister   . Healthy Sister   . Healthy Sister   . Hypertension Brother   . Diabetes type II Brother   . Anxiety disorder Brother   . COPD Brother   . COPD Brother   . Healthy Brother   . Healthy Brother   . Breast cancer Maternal Aunt   . Throat cancer Maternal Uncle   . Hypertension Maternal Grandmother   . Arthritis Maternal Grandmother   . Diabetes Paternal Grandmother   . Healthy Daughter   . Healthy Son   . Healthy Son   . Healthy Son   . Hearing loss Son   . Colon cancer Neg Hx   . Pancreatic cancer Neg Hx   . Stomach cancer Neg Hx   . Liver disease Neg Hx   . Esophageal cancer Neg Hx   . Rectal cancer Neg Hx     Review of Systems  Constitutional:  Negative for chills, fatigue and fever.  HENT:  Negative for congestion, ear pain, sinus pressure and sore  throat.   Respiratory:  Negative for  cough and shortness of breath.   Cardiovascular:  Negative for chest pain.  Gastrointestinal:  Negative for abdominal pain, constipation, diarrhea, nausea and vomiting.  Genitourinary:  Negative for dysuria and frequency.  Musculoskeletal:  Negative for arthralgias, back pain and myalgias.  Neurological:  Negative for dizziness and headaches.  Psychiatric/Behavioral:  Negative for dysphoric mood. The patient is not nervous/anxious.      Objective:  BP 120/82   Pulse 82   Temp 98.2 F (36.8 C)   Ht 5' 4 (1.626 m)   Wt 239 lb (108.4 kg)   SpO2 98%   BMI 41.02 kg/m      03/31/2024    1:07 PM 03/16/2024    4:00 PM 03/11/2024   10:49 AM  BP/Weight  Systolic BP 120  881  Diastolic BP 82  78  Wt. (Lbs) 239  240.4  BMI 41.02 kg/m2  41.26 kg/m2     Information is confidential and restricted. Go to Review Flowsheets to unlock data.    Physical Exam  Lab Results  Component Value Date   WBC 5.3 02/11/2024   HGB 11.6 02/11/2024   HCT 36.6 02/11/2024   PLT 210 02/11/2024   GLUCOSE 82 02/11/2024   CHOL 196 02/11/2024   TRIG 109 02/11/2024   HDL 46 02/11/2024   LDLCALC 130 (H) 02/11/2024   ALT 11 02/11/2024   AST 17 02/11/2024   NA 140 02/11/2024   K 3.9 02/11/2024   CL 104 02/11/2024   CREATININE 1.06 (H) 02/11/2024   BUN 13 02/11/2024   CO2 21 02/11/2024   TSH 0.450 08/16/2022   INR 1.1 12/11/2023   HGBA1C 5.5 07/14/2021      Assessment & Plan:   Well adult exam  Encounter for screening mammogram for malignant neoplasm of breast -     3D Screening Mammogram, Left and Right; Future     Body mass index is 41.02 kg/m.   These are the goals we discussed:  Goals   None      This is a list of the screening recommended for you and due dates:  Health Maintenance  Topic Date Due  . Zoster (Shingles) Vaccine (1 of 2) 06/30/2024*  . Flu Shot  10/27/2024*  . Pneumococcal Vaccine for age over 35 (1 of 1 - PCV) 03/31/2025*  . Hepatitis B Vaccine (1 of 3 - 19+  3-dose series) 03/31/2025*  . Mammogram  04/09/2024  . Colon Cancer Screening  08/10/2028  . DTaP/Tdap/Td vaccine (2 - Td or Tdap) 02/07/2032  . Hepatitis C Screening  Completed  . HIV Screening  Completed  . HPV Vaccine  Aged Out  . Meningitis B Vaccine  Aged Out  . COVID-19 Vaccine  Discontinued  *Topic was postponed. The date shown is not the original due date.     No orders of the defined types were placed in this encounter.   Follow-up: No follow-ups on file.  An After Visit Summary was printed and given to the patient.  Nashaun Hillmer, MD [facility] [phone]

## 2024-04-01 DIAGNOSIS — M25561 Pain in right knee: Secondary | ICD-10-CM | POA: Diagnosis not present

## 2024-04-01 DIAGNOSIS — Z96651 Presence of right artificial knee joint: Secondary | ICD-10-CM | POA: Diagnosis not present

## 2024-04-01 DIAGNOSIS — R2689 Other abnormalities of gait and mobility: Secondary | ICD-10-CM | POA: Diagnosis not present

## 2024-04-01 DIAGNOSIS — M25661 Stiffness of right knee, not elsewhere classified: Secondary | ICD-10-CM | POA: Diagnosis not present

## 2024-04-02 NOTE — Assessment & Plan Note (Signed)
 Routine wellness exam with normal vital signs and satisfactory lab results from July. Depression and fall risk scores are zero. No smoking or significant alcohol consumption. Up to date with dental and eye exams. - Order mammogram for October on the mammogram bus. - Recommend shingles vaccine series at the pharmacy. - Discuss flu vaccine availability later this week. - other age appropriate counseling provided. Return for wellness exam in 1 year

## 2024-04-02 NOTE — Assessment & Plan Note (Addendum)
 Status post right knee replacement on August 18th with improving pain and well-healing incision. Some pitting edema present. Currently using Rehazomorphone for breakthrough pain approximately once daily. - Continue physical therapy three times a week. - Encouraged to reduce hydromorphone  usage to avoid constipation.

## 2024-04-03 ENCOUNTER — Other Ambulatory Visit: Payer: Self-pay

## 2024-04-03 ENCOUNTER — Other Ambulatory Visit (HOSPITAL_COMMUNITY): Payer: Self-pay

## 2024-04-03 DIAGNOSIS — M25661 Stiffness of right knee, not elsewhere classified: Secondary | ICD-10-CM | POA: Diagnosis not present

## 2024-04-03 DIAGNOSIS — Z6841 Body Mass Index (BMI) 40.0 and over, adult: Secondary | ICD-10-CM | POA: Diagnosis not present

## 2024-04-03 DIAGNOSIS — Z713 Dietary counseling and surveillance: Secondary | ICD-10-CM | POA: Diagnosis not present

## 2024-04-03 DIAGNOSIS — Z96651 Presence of right artificial knee joint: Secondary | ICD-10-CM | POA: Diagnosis not present

## 2024-04-03 DIAGNOSIS — R2689 Other abnormalities of gait and mobility: Secondary | ICD-10-CM | POA: Diagnosis not present

## 2024-04-03 DIAGNOSIS — M25561 Pain in right knee: Secondary | ICD-10-CM | POA: Diagnosis not present

## 2024-04-03 MED ORDER — DICLOFENAC SODIUM 75 MG PO TBEC
75.0000 mg | DELAYED_RELEASE_TABLET | Freq: Two times a day (BID) | ORAL | 0 refills | Status: DC | PRN
  Filled 2024-04-03 – 2024-05-05 (×2): qty 60, 30d supply, fill #0

## 2024-04-06 ENCOUNTER — Other Ambulatory Visit (HOSPITAL_COMMUNITY): Payer: Self-pay

## 2024-04-06 DIAGNOSIS — R2689 Other abnormalities of gait and mobility: Secondary | ICD-10-CM | POA: Diagnosis not present

## 2024-04-06 DIAGNOSIS — M25561 Pain in right knee: Secondary | ICD-10-CM | POA: Diagnosis not present

## 2024-04-06 DIAGNOSIS — Z96651 Presence of right artificial knee joint: Secondary | ICD-10-CM | POA: Diagnosis not present

## 2024-04-06 DIAGNOSIS — M25661 Stiffness of right knee, not elsewhere classified: Secondary | ICD-10-CM | POA: Diagnosis not present

## 2024-04-06 MED ORDER — METHOCARBAMOL 500 MG PO TABS
500.0000 mg | ORAL_TABLET | Freq: Four times a day (QID) | ORAL | 0 refills | Status: DC
  Filled 2024-04-06 – 2024-04-21 (×2): qty 60, 8d supply, fill #0

## 2024-04-08 ENCOUNTER — Telehealth: Payer: Self-pay | Admitting: Registered Nurse

## 2024-04-08 ENCOUNTER — Other Ambulatory Visit (HOSPITAL_COMMUNITY): Payer: Self-pay

## 2024-04-08 DIAGNOSIS — M25661 Stiffness of right knee, not elsewhere classified: Secondary | ICD-10-CM | POA: Diagnosis not present

## 2024-04-08 DIAGNOSIS — M1711 Unilateral primary osteoarthritis, right knee: Secondary | ICD-10-CM

## 2024-04-08 DIAGNOSIS — G894 Chronic pain syndrome: Secondary | ICD-10-CM

## 2024-04-08 DIAGNOSIS — G8929 Other chronic pain: Secondary | ICD-10-CM

## 2024-04-08 DIAGNOSIS — M25561 Pain in right knee: Secondary | ICD-10-CM | POA: Diagnosis not present

## 2024-04-08 DIAGNOSIS — Z96651 Presence of right artificial knee joint: Secondary | ICD-10-CM | POA: Diagnosis not present

## 2024-04-08 DIAGNOSIS — R2689 Other abnormalities of gait and mobility: Secondary | ICD-10-CM | POA: Diagnosis not present

## 2024-04-08 MED ORDER — OXYCODONE-ACETAMINOPHEN 10-325 MG PO TABS
1.0000 | ORAL_TABLET | Freq: Every day | ORAL | 0 refills | Status: DC | PRN
  Filled 2024-04-08: qty 150, 32d supply, fill #0
  Filled 2024-04-23: qty 150, 30d supply, fill #0

## 2024-04-08 NOTE — Telephone Encounter (Signed)
 PMP was Reviewed.  Oxycodone  e-scribed.  Desiree Russell is aware via My-Chart message.

## 2024-04-09 DIAGNOSIS — Z6841 Body Mass Index (BMI) 40.0 and over, adult: Secondary | ICD-10-CM | POA: Diagnosis not present

## 2024-04-09 DIAGNOSIS — Z713 Dietary counseling and surveillance: Secondary | ICD-10-CM | POA: Diagnosis not present

## 2024-04-10 ENCOUNTER — Other Ambulatory Visit (HOSPITAL_COMMUNITY): Payer: Self-pay

## 2024-04-10 DIAGNOSIS — Z96651 Presence of right artificial knee joint: Secondary | ICD-10-CM | POA: Diagnosis not present

## 2024-04-10 DIAGNOSIS — R2689 Other abnormalities of gait and mobility: Secondary | ICD-10-CM | POA: Diagnosis not present

## 2024-04-10 DIAGNOSIS — M25561 Pain in right knee: Secondary | ICD-10-CM | POA: Diagnosis not present

## 2024-04-10 DIAGNOSIS — M25661 Stiffness of right knee, not elsewhere classified: Secondary | ICD-10-CM | POA: Diagnosis not present

## 2024-04-13 ENCOUNTER — Other Ambulatory Visit (HOSPITAL_COMMUNITY): Payer: Self-pay

## 2024-04-13 DIAGNOSIS — M25561 Pain in right knee: Secondary | ICD-10-CM | POA: Diagnosis not present

## 2024-04-13 DIAGNOSIS — Z96651 Presence of right artificial knee joint: Secondary | ICD-10-CM | POA: Diagnosis not present

## 2024-04-13 DIAGNOSIS — M25661 Stiffness of right knee, not elsewhere classified: Secondary | ICD-10-CM | POA: Diagnosis not present

## 2024-04-13 DIAGNOSIS — R2689 Other abnormalities of gait and mobility: Secondary | ICD-10-CM | POA: Diagnosis not present

## 2024-04-16 DIAGNOSIS — Z713 Dietary counseling and surveillance: Secondary | ICD-10-CM | POA: Diagnosis not present

## 2024-04-16 DIAGNOSIS — E66813 Obesity, class 3: Secondary | ICD-10-CM | POA: Diagnosis not present

## 2024-04-17 DIAGNOSIS — Z96651 Presence of right artificial knee joint: Secondary | ICD-10-CM | POA: Diagnosis not present

## 2024-04-17 DIAGNOSIS — R2689 Other abnormalities of gait and mobility: Secondary | ICD-10-CM | POA: Diagnosis not present

## 2024-04-17 DIAGNOSIS — M25661 Stiffness of right knee, not elsewhere classified: Secondary | ICD-10-CM | POA: Diagnosis not present

## 2024-04-17 DIAGNOSIS — M25561 Pain in right knee: Secondary | ICD-10-CM | POA: Diagnosis not present

## 2024-04-21 ENCOUNTER — Other Ambulatory Visit (HOSPITAL_COMMUNITY): Payer: Self-pay

## 2024-04-22 ENCOUNTER — Other Ambulatory Visit (HOSPITAL_COMMUNITY): Payer: Self-pay

## 2024-04-22 DIAGNOSIS — Z96651 Presence of right artificial knee joint: Secondary | ICD-10-CM | POA: Diagnosis not present

## 2024-04-22 DIAGNOSIS — R2689 Other abnormalities of gait and mobility: Secondary | ICD-10-CM | POA: Diagnosis not present

## 2024-04-22 DIAGNOSIS — M25661 Stiffness of right knee, not elsewhere classified: Secondary | ICD-10-CM | POA: Diagnosis not present

## 2024-04-22 DIAGNOSIS — M25561 Pain in right knee: Secondary | ICD-10-CM | POA: Diagnosis not present

## 2024-04-23 ENCOUNTER — Other Ambulatory Visit (HOSPITAL_COMMUNITY): Payer: Self-pay

## 2024-04-23 DIAGNOSIS — E66813 Obesity, class 3: Secondary | ICD-10-CM | POA: Diagnosis not present

## 2024-04-23 DIAGNOSIS — Z713 Dietary counseling and surveillance: Secondary | ICD-10-CM | POA: Diagnosis not present

## 2024-04-27 DIAGNOSIS — M25661 Stiffness of right knee, not elsewhere classified: Secondary | ICD-10-CM | POA: Diagnosis not present

## 2024-04-27 DIAGNOSIS — M25561 Pain in right knee: Secondary | ICD-10-CM | POA: Diagnosis not present

## 2024-04-27 DIAGNOSIS — R2689 Other abnormalities of gait and mobility: Secondary | ICD-10-CM | POA: Diagnosis not present

## 2024-04-27 DIAGNOSIS — Z96651 Presence of right artificial knee joint: Secondary | ICD-10-CM | POA: Diagnosis not present

## 2024-04-28 ENCOUNTER — Other Ambulatory Visit: Payer: Self-pay

## 2024-04-28 ENCOUNTER — Other Ambulatory Visit (HOSPITAL_COMMUNITY): Payer: Self-pay

## 2024-04-28 DIAGNOSIS — Z8673 Personal history of transient ischemic attack (TIA), and cerebral infarction without residual deficits: Secondary | ICD-10-CM

## 2024-04-28 MED ORDER — VEOZAH 45 MG PO TABS
1.0000 | ORAL_TABLET | Freq: Every day | ORAL | 0 refills | Status: DC
  Filled 2024-04-28: qty 90, 90d supply, fill #0

## 2024-04-28 MED ORDER — CLOPIDOGREL BISULFATE 75 MG PO TABS
75.0000 mg | ORAL_TABLET | Freq: Every day | ORAL | 0 refills | Status: DC
  Filled 2024-04-28: qty 90, 90d supply, fill #0

## 2024-04-29 DIAGNOSIS — R2689 Other abnormalities of gait and mobility: Secondary | ICD-10-CM | POA: Diagnosis not present

## 2024-04-29 DIAGNOSIS — M25661 Stiffness of right knee, not elsewhere classified: Secondary | ICD-10-CM | POA: Diagnosis not present

## 2024-04-29 DIAGNOSIS — M25561 Pain in right knee: Secondary | ICD-10-CM | POA: Diagnosis not present

## 2024-04-29 DIAGNOSIS — Z96651 Presence of right artificial knee joint: Secondary | ICD-10-CM | POA: Diagnosis not present

## 2024-05-05 ENCOUNTER — Other Ambulatory Visit: Payer: Self-pay

## 2024-05-05 ENCOUNTER — Other Ambulatory Visit (HOSPITAL_COMMUNITY): Payer: Self-pay

## 2024-05-06 ENCOUNTER — Other Ambulatory Visit (HOSPITAL_COMMUNITY): Payer: Self-pay

## 2024-05-06 DIAGNOSIS — R2689 Other abnormalities of gait and mobility: Secondary | ICD-10-CM | POA: Diagnosis not present

## 2024-05-06 DIAGNOSIS — Z96651 Presence of right artificial knee joint: Secondary | ICD-10-CM | POA: Diagnosis not present

## 2024-05-06 DIAGNOSIS — M25661 Stiffness of right knee, not elsewhere classified: Secondary | ICD-10-CM | POA: Diagnosis not present

## 2024-05-06 DIAGNOSIS — M25561 Pain in right knee: Secondary | ICD-10-CM | POA: Diagnosis not present

## 2024-05-07 ENCOUNTER — Other Ambulatory Visit (HOSPITAL_COMMUNITY): Payer: Self-pay

## 2024-05-07 DIAGNOSIS — E66813 Obesity, class 3: Secondary | ICD-10-CM | POA: Diagnosis not present

## 2024-05-07 MED ORDER — PREGABALIN 150 MG PO CAPS
300.0000 mg | ORAL_CAPSULE | Freq: Every day | ORAL | 0 refills | Status: DC
  Filled 2024-05-07: qty 10, 5d supply, fill #0
  Filled 2024-05-08: qty 170, 85d supply, fill #0

## 2024-05-08 ENCOUNTER — Other Ambulatory Visit (HOSPITAL_COMMUNITY): Payer: Self-pay

## 2024-05-08 MED ORDER — PREDNISONE 20 MG PO TABS
40.0000 mg | ORAL_TABLET | Freq: Every day | ORAL | 0 refills | Status: DC
  Filled 2024-05-08: qty 14, 7d supply, fill #0

## 2024-05-11 DIAGNOSIS — Z96651 Presence of right artificial knee joint: Secondary | ICD-10-CM | POA: Diagnosis not present

## 2024-05-11 DIAGNOSIS — R2689 Other abnormalities of gait and mobility: Secondary | ICD-10-CM | POA: Diagnosis not present

## 2024-05-11 DIAGNOSIS — M25661 Stiffness of right knee, not elsewhere classified: Secondary | ICD-10-CM | POA: Diagnosis not present

## 2024-05-11 DIAGNOSIS — M25561 Pain in right knee: Secondary | ICD-10-CM | POA: Diagnosis not present

## 2024-05-12 DIAGNOSIS — M25551 Pain in right hip: Secondary | ICD-10-CM | POA: Diagnosis not present

## 2024-05-12 DIAGNOSIS — M9906 Segmental and somatic dysfunction of lower extremity: Secondary | ICD-10-CM | POA: Diagnosis not present

## 2024-05-12 DIAGNOSIS — M609 Myositis, unspecified: Secondary | ICD-10-CM | POA: Diagnosis not present

## 2024-05-12 DIAGNOSIS — M5416 Radiculopathy, lumbar region: Secondary | ICD-10-CM | POA: Diagnosis not present

## 2024-05-12 DIAGNOSIS — M5031 Other cervical disc degeneration,  high cervical region: Secondary | ICD-10-CM | POA: Diagnosis not present

## 2024-05-12 DIAGNOSIS — M530 Cervicocranial syndrome: Secondary | ICD-10-CM | POA: Diagnosis not present

## 2024-05-12 DIAGNOSIS — M9901 Segmental and somatic dysfunction of cervical region: Secondary | ICD-10-CM | POA: Diagnosis not present

## 2024-05-12 DIAGNOSIS — M531 Cervicobrachial syndrome: Secondary | ICD-10-CM | POA: Diagnosis not present

## 2024-05-14 DIAGNOSIS — G8929 Other chronic pain: Secondary | ICD-10-CM | POA: Diagnosis not present

## 2024-05-14 DIAGNOSIS — M25561 Pain in right knee: Secondary | ICD-10-CM | POA: Diagnosis not present

## 2024-05-15 ENCOUNTER — Other Ambulatory Visit: Payer: Self-pay

## 2024-05-15 ENCOUNTER — Telehealth (HOSPITAL_BASED_OUTPATIENT_CLINIC_OR_DEPARTMENT_OTHER): Admitting: Psychiatry

## 2024-05-15 ENCOUNTER — Encounter (HOSPITAL_COMMUNITY): Payer: Self-pay | Admitting: Psychiatry

## 2024-05-15 ENCOUNTER — Telehealth (HOSPITAL_COMMUNITY): Admitting: Psychiatry

## 2024-05-15 ENCOUNTER — Other Ambulatory Visit (HOSPITAL_COMMUNITY): Payer: Self-pay

## 2024-05-15 ENCOUNTER — Ambulatory Visit: Admission: RE | Admit: 2024-05-15 | Source: Ambulatory Visit

## 2024-05-15 VITALS — Wt 230.0 lb

## 2024-05-15 DIAGNOSIS — F331 Major depressive disorder, recurrent, moderate: Secondary | ICD-10-CM

## 2024-05-15 DIAGNOSIS — F419 Anxiety disorder, unspecified: Secondary | ICD-10-CM | POA: Diagnosis not present

## 2024-05-15 DIAGNOSIS — F4310 Post-traumatic stress disorder, unspecified: Secondary | ICD-10-CM | POA: Diagnosis not present

## 2024-05-15 MED ORDER — FLUOXETINE HCL 20 MG PO CAPS
20.0000 mg | ORAL_CAPSULE | Freq: Every evening | ORAL | 0 refills | Status: DC
  Filled 2024-05-15 – 2024-06-18 (×2): qty 90, 90d supply, fill #0

## 2024-05-15 MED ORDER — LAMOTRIGINE 25 MG PO TABS
50.0000 mg | ORAL_TABLET | Freq: Every day | ORAL | 1 refills | Status: DC
  Filled 2024-05-15: qty 60, 30d supply, fill #0
  Filled 2024-06-18: qty 60, 30d supply, fill #1

## 2024-05-15 MED ORDER — OLANZAPINE 5 MG PO TABS
5.0000 mg | ORAL_TABLET | Freq: Every evening | ORAL | 0 refills | Status: DC
  Filled 2024-05-15 – 2024-06-18 (×2): qty 90, 90d supply, fill #0

## 2024-05-15 MED ORDER — VORTIOXETINE HBR 20 MG PO TABS
20.0000 mg | ORAL_TABLET | Freq: Every day | ORAL | 0 refills | Status: DC
  Filled 2024-05-15 – 2024-07-31 (×2): qty 90, 90d supply, fill #0
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
## 2024-05-18 DIAGNOSIS — M9906 Segmental and somatic dysfunction of lower extremity: Secondary | ICD-10-CM | POA: Diagnosis not present

## 2024-05-18 DIAGNOSIS — M5031 Other cervical disc degeneration,  high cervical region: Secondary | ICD-10-CM | POA: Diagnosis not present

## 2024-05-18 DIAGNOSIS — M9903 Segmental and somatic dysfunction of lumbar region: Secondary | ICD-10-CM | POA: Diagnosis not present

## 2024-05-18 DIAGNOSIS — M25551 Pain in right hip: Secondary | ICD-10-CM | POA: Diagnosis not present

## 2024-05-18 DIAGNOSIS — M5416 Radiculopathy, lumbar region: Secondary | ICD-10-CM | POA: Diagnosis not present

## 2024-05-18 DIAGNOSIS — M531 Cervicobrachial syndrome: Secondary | ICD-10-CM | POA: Diagnosis not present

## 2024-05-18 DIAGNOSIS — M609 Myositis, unspecified: Secondary | ICD-10-CM | POA: Diagnosis not present

## 2024-05-18 DIAGNOSIS — M530 Cervicocranial syndrome: Secondary | ICD-10-CM | POA: Diagnosis not present

## 2024-05-18 DIAGNOSIS — M9901 Segmental and somatic dysfunction of cervical region: Secondary | ICD-10-CM | POA: Diagnosis not present

## 2024-05-19 NOTE — Progress Notes (Unsigned)
 Subjective:    Patient ID: Desiree Russell, female    DOB: 1971/07/17, 53 y.o.   MRN: [id number]  HPI: Desiree Russell is a 53 y.o. female who returns for follow up appointment for chronic pain and medication refill. She states her pain is located in her mid- lower back and bilateral hip pain. She also reports generalized pain. She rates her pain 7. Her current exercise regime is walking and performing stretching exercises.  Desiree Russell  equivalent is 75.00 MME.   Oral Swab was Performed today.      Pain Inventory Average Pain 5 Pain Right Now 7 My pain is constant, dull, aching, and pressure  In the last 24 hours, has pain interfered with the following? General activity 8 Relation with others 0 Enjoyment of life 8 What TIME of day is your pain at its worst? morning  and night Sleep (in general) Fair  Pain is worse with: walking, bending, and standing Pain improves with: rest, heat/ice, and medication Relief from Meds: 5  Family History  Problem Relation Age of Onset   Hypertension Mother    Arthritis Mother    Depression Mother    Hypertension Father    Heart attack Father 87   Anxiety disorder Sister    Depression Sister    Healthy Sister    Healthy Sister    Hypertension Brother    Diabetes type II Brother    Anxiety disorder Brother    COPD Brother    COPD Brother    Healthy Brother    Healthy Brother    Breast cancer Maternal Aunt    Throat cancer Maternal Uncle    Hypertension Maternal Grandmother    Arthritis Maternal Grandmother    Diabetes Paternal Grandmother    Healthy Daughter    Healthy Son    Healthy Son    Healthy Son    Hearing loss Son    Colon cancer Neg Hx    Pancreatic cancer Neg Hx    Stomach cancer Neg Hx    Liver disease Neg Hx    Esophageal cancer Neg Hx    Rectal cancer Neg Hx    Social History   Socioeconomic History   Marital status: Married    Spouse name: Not on file   Number of children: 6   Years of education: Not  on file   Highest education level: Bachelor's degree (e.g., BA, AB, BS)  Occupational History   Not on file  Tobacco Use   Smoking status: Never   Smokeless tobacco: Never  Vaping Use   Vaping status: Never Used  Substance and Sexual Activity   Alcohol use: Yes    Alcohol/week: 1.0 standard drink of alcohol    Types: 1 Glasses of wine per week    Comment: occ    Drug use: No   Sexual activity: Yes    Partners: Male    Birth control/protection: Surgical  Other Topics Concern   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     Social Drivers of Corporate investment banker Strain: Low Risk  (11/04/2023)   Overall Financial Resource Strain (CARDIA)    Difficulty of Paying Living Expenses: Not hard at all  Food Insecurity: No Food Insecurity (12/11/2023)   Hunger Vital Sign    Worried About Running Out of Food in the Last Year: Never true    Ran Out of Food in the  Last Year: Never true  Transportation Needs: No Transportation Needs (12/11/2023)   PRAPARE - Administrator, Civil Service (Medical): No    Lack of Transportation (Non-Medical): No  Physical Activity: Sufficiently Active (11/04/2023)   Exercise Vital Sign    Days of Exercise per Week: 5 days    Minutes of Exercise per Session: 30 min  Stress: No Stress Concern Present (11/04/2023)   [name] of Occupational Health - Occupational Stress Questionnaire    Feeling of Stress : Only a little  Social Connections: Moderately Integrated (11/04/2023)   Social Connection and Isolation Panel    Frequency of Communication with Friends and Family: Three times a week    Frequency of Social Gatherings with Friends and Family: Once a week    Attends Religious Services: More than 4 times per year    Active Member of [employer] or Organizations: No    Attends Engineer, structural: Not on file    Marital Status: Married   Past Surgical History:  Procedure  Laterality Date   ABDOMINAL HYSTERECTOMY     CHOLECYSTECTOMY     CHOLECYSTECTOMY, LAPAROSCOPIC     COLONOSCOPY     KNEE ARTHROSCOPY Right 01/02/2023   TUBAL LIGATION  2000   UPPER GASTROINTESTINAL ENDOSCOPY     Past Surgical History:  Procedure Laterality Date   ABDOMINAL HYSTERECTOMY     CHOLECYSTECTOMY     CHOLECYSTECTOMY, LAPAROSCOPIC     COLONOSCOPY     KNEE ARTHROSCOPY Right 01/02/2023   TUBAL LIGATION  2000   UPPER GASTROINTESTINAL ENDOSCOPY     Past Medical History:  Diagnosis Date   Allergy    Anemia    Anxiety    Arthritis    Depression Lifelong   Dyslipidemia    Fibromyalgia    GERD (gastroesophageal reflux disease)    Headache    Hyperlipidemia 2017   Migraines    Pericarditis    Stroke (HCC) 12/2020   Tachycardia    Thyroid  disease 2003   TIA (transient ischemic attack)    Recurrent   Ulcer Unknown   There were no vitals taken for this visit.  Opioid Risk Score:   Fall Risk Score:  `1  Depression screen PHQ 2/9     03/31/2024    1:13 PM 03/11/2024   11:01 AM 12/30/2023    3:33 PM 12/17/2023    3:22 PM 11/04/2023    3:11 PM 10/17/2023    3:12 PM 09/26/2023    3:38 PM  Depression screen PHQ 2/9  Decreased Interest 0 0 1 0 0 0 0  Down, Depressed, Hopeless 0 0 0 0 0 0 0  PHQ - 2 Score 0 0 1 0 0 0 0  Altered sleeping     0    Tired, decreased energy     0    Change in appetite     0    Feeling bad or failure about yourself      0    Trouble concentrating     0    Moving slowly or fidgety/restless     0    Suicidal thoughts     0    PHQ-9 Score     0    Difficult doing work/chores     Not difficult at all      Review of Systems  Musculoskeletal:  Positive for back pain and neck pain.       Pain in the right shoulder and upper  arm  All other systems reviewed and are negative.      Objective:   Physical Exam Vitals and nursing note reviewed.  Constitutional:      Appearance: Normal appearance.  Cardiovascular:     Rate and Rhythm: Normal  rate and regular rhythm.     Pulses: Normal pulses.     Heart sounds: Normal heart sounds.  Pulmonary:     Effort: Pulmonary effort is normal.     Breath sounds: Normal breath sounds.  Musculoskeletal:     Comments: Normal Muscle Bulk and Muscle Testing Reveals:  Upper Extremities: Full ROM and Muscle Strength 5/5  Thoracic and Lumbar Hypersensitivity Bilateral Greater Trochanter Tenderness Lower Extremities: Full ROM and Muscle Strength 5/5 Bilateral Lower Extremities Flexion Produces Pain into her Bilateral Patella's  Arises from Table slowly Narrow Based  Gait     Skin:    General: Skin is warm and dry.  Neurological:     Mental Status: She is alert and oriented to person, place, and time.  Psychiatric:        Mood and Affect: Mood normal.        Behavior: Behavior normal.          Assessment & Plan:  Chronic Bilateral Thoracic Back Pain/ Lumbo Sacral Spondylosis: Chronic Bilateral Low Back Pain: Continue HEP as Tolerated. Continue to Monitor. S/P On 09/12/2023, with Dr Carilyn. Bilateral Lumbar L3, L4 medial branch blocks and L 5 dorsal ramus injection under fluoroscopic guidance . Good relief noted. 05/20/2024 2. Chronic Bilateral Hip Pain/ Bilateral Greater Trochanter Bursitis:  Continue HEP as Tolerated. Continue to Monitor. 05/20/2024 3. Primary Osteoarthritis Right Knee: ortho Following. S/P Total Right Knee Replacement Continue to Monitor. 05/20/2024 4. Chronic Pain Syndrome: Refilled:  Oxycodone  10 mg one tablet 5 times a day as needed for pain #150 We will continue the opioid monitoring program, this consists of regular clinic visits, examinations, urine drug screen, pill counts as well as use of [place]  Controlled Substance Reporting system. A 12 month History has been reviewed on the [place]  Controlled Substance Reporting System on 05/20/2024.    F/U in 1 month

## 2024-05-20 ENCOUNTER — Encounter: Attending: Physical Medicine & Rehabilitation | Admitting: Registered Nurse

## 2024-05-20 ENCOUNTER — Other Ambulatory Visit: Payer: Self-pay | Admitting: Cardiology

## 2024-05-20 ENCOUNTER — Other Ambulatory Visit (HOSPITAL_COMMUNITY): Payer: Self-pay

## 2024-05-20 ENCOUNTER — Encounter: Payer: Self-pay | Admitting: Registered Nurse

## 2024-05-20 DIAGNOSIS — M7061 Trochanteric bursitis, right hip: Secondary | ICD-10-CM | POA: Insufficient documentation

## 2024-05-20 DIAGNOSIS — M25551 Pain in right hip: Secondary | ICD-10-CM | POA: Diagnosis not present

## 2024-05-20 DIAGNOSIS — Z5181 Encounter for therapeutic drug level monitoring: Secondary | ICD-10-CM | POA: Insufficient documentation

## 2024-05-20 DIAGNOSIS — M1711 Unilateral primary osteoarthritis, right knee: Secondary | ICD-10-CM | POA: Insufficient documentation

## 2024-05-20 DIAGNOSIS — Z79891 Long term (current) use of opiate analgesic: Secondary | ICD-10-CM | POA: Diagnosis not present

## 2024-05-20 DIAGNOSIS — M7062 Trochanteric bursitis, left hip: Secondary | ICD-10-CM | POA: Diagnosis not present

## 2024-05-20 DIAGNOSIS — M546 Pain in thoracic spine: Secondary | ICD-10-CM | POA: Insufficient documentation

## 2024-05-20 DIAGNOSIS — M530 Cervicocranial syndrome: Secondary | ICD-10-CM | POA: Diagnosis not present

## 2024-05-20 DIAGNOSIS — M9901 Segmental and somatic dysfunction of cervical region: Secondary | ICD-10-CM | POA: Diagnosis not present

## 2024-05-20 DIAGNOSIS — M5416 Radiculopathy, lumbar region: Secondary | ICD-10-CM | POA: Diagnosis not present

## 2024-05-20 DIAGNOSIS — M545 Low back pain, unspecified: Secondary | ICD-10-CM | POA: Insufficient documentation

## 2024-05-20 DIAGNOSIS — G8929 Other chronic pain: Secondary | ICD-10-CM | POA: Diagnosis not present

## 2024-05-20 DIAGNOSIS — M5031 Other cervical disc degeneration,  high cervical region: Secondary | ICD-10-CM | POA: Diagnosis not present

## 2024-05-20 DIAGNOSIS — M531 Cervicobrachial syndrome: Secondary | ICD-10-CM | POA: Diagnosis not present

## 2024-05-20 DIAGNOSIS — M9906 Segmental and somatic dysfunction of lower extremity: Secondary | ICD-10-CM | POA: Diagnosis not present

## 2024-05-20 DIAGNOSIS — G894 Chronic pain syndrome: Secondary | ICD-10-CM | POA: Diagnosis not present

## 2024-05-20 DIAGNOSIS — M609 Myositis, unspecified: Secondary | ICD-10-CM | POA: Diagnosis not present

## 2024-05-20 MED ORDER — OXYCODONE HCL 10 MG PO TABS
10.0000 mg | ORAL_TABLET | Freq: Every day | ORAL | 0 refills | Status: DC | PRN
  Filled 2024-05-20: qty 150, 30d supply, fill #0

## 2024-05-21 DIAGNOSIS — E66813 Obesity, class 3: Secondary | ICD-10-CM | POA: Diagnosis not present

## 2024-05-22 ENCOUNTER — Other Ambulatory Visit (HOSPITAL_COMMUNITY): Payer: Self-pay

## 2024-05-22 MED ORDER — METOPROLOL SUCCINATE ER 50 MG PO TB24
50.0000 mg | ORAL_TABLET | Freq: Every day | ORAL | 0 refills | Status: DC
  Filled 2024-05-22 – 2024-06-02 (×2): qty 30, 30d supply, fill #0

## 2024-05-24 LAB — DRUG TOX MONITOR 1 W/CONF, ORAL FLD
Amphetamines: NEGATIVE ng/mL (ref <10)
Barbiturates: NEGATIVE ng/mL (ref <10)
Benzodiazepines: NEGATIVE ng/mL (ref <0.50)
Buprenorphine: NEGATIVE ng/mL (ref <0.10)
Cocaine: NEGATIVE ng/mL (ref <5.0)
Codeine: NEGATIVE ng/mL (ref <2.5)
Dihydrocodeine: NEGATIVE ng/mL (ref <2.5)
Fentanyl: NEGATIVE ng/mL (ref <0.10)
Heroin Metabolite: NEGATIVE ng/mL (ref <1.0)
Hydrocodone: NEGATIVE ng/mL (ref <2.5)
Hydromorphone: NEGATIVE ng/mL (ref <2.5)
MARIJUANA: NEGATIVE ng/mL (ref <2.5)
MDMA: NEGATIVE ng/mL (ref <10)
Meprobamate: NEGATIVE ng/mL (ref <2.5)
Methadone: NEGATIVE ng/mL (ref <5.0)
Morphine: NEGATIVE ng/mL (ref <2.5)
Nicotine Metabolite: NEGATIVE ng/mL (ref <5.0)
Norhydrocodone: NEGATIVE ng/mL (ref <2.5)
Noroxycodone: 44 ng/mL — ABNORMAL HIGH (ref <2.5)
Opiates: POSITIVE ng/mL — AB (ref <2.5)
Oxycodone: 197 ng/mL — ABNORMAL HIGH (ref <2.5)
Oxymorphone: NEGATIVE ng/mL (ref <2.5)
Phencyclidine: NEGATIVE ng/mL (ref <10)
Tapentadol: NEGATIVE ng/mL (ref <5.0)
Tramadol: NEGATIVE ng/mL (ref <5.0)
Zolpidem: NEGATIVE ng/mL (ref <5.0)

## 2024-05-24 LAB — DRUG TOX ALC METAB W/CON, ORAL FLD: Alcohol Metabolite: NEGATIVE ng/mL (ref <25)

## 2024-05-26 DIAGNOSIS — G8929 Other chronic pain: Secondary | ICD-10-CM | POA: Diagnosis not present

## 2024-05-26 DIAGNOSIS — M47816 Spondylosis without myelopathy or radiculopathy, lumbar region: Secondary | ICD-10-CM | POA: Diagnosis not present

## 2024-06-01 ENCOUNTER — Other Ambulatory Visit (HOSPITAL_COMMUNITY): Payer: Self-pay

## 2024-06-02 ENCOUNTER — Other Ambulatory Visit (HOSPITAL_COMMUNITY): Payer: Self-pay

## 2024-06-02 MED ORDER — TIZANIDINE HCL 4 MG PO TABS
4.0000 mg | ORAL_TABLET | Freq: Two times a day (BID) | ORAL | 1 refills | Status: DC | PRN
  Filled 2024-06-02: qty 60, 30d supply, fill #0
  Filled 2024-06-18 – 2024-06-30 (×2): qty 60, 30d supply, fill #1

## 2024-06-04 ENCOUNTER — Ambulatory Visit

## 2024-06-04 DIAGNOSIS — Z23 Encounter for immunization: Secondary | ICD-10-CM

## 2024-06-11 DIAGNOSIS — E66813 Obesity, class 3: Secondary | ICD-10-CM | POA: Diagnosis not present

## 2024-06-16 ENCOUNTER — Other Ambulatory Visit (HOSPITAL_COMMUNITY): Payer: Self-pay

## 2024-06-16 MED ORDER — ONDANSETRON HCL 4 MG PO TABS
4.0000 mg | ORAL_TABLET | Freq: Three times a day (TID) | ORAL | 2 refills | Status: DC | PRN
  Filled 2024-06-16: qty 20, 7d supply, fill #0

## 2024-06-18 ENCOUNTER — Other Ambulatory Visit (HOSPITAL_COMMUNITY): Payer: Self-pay

## 2024-06-19 ENCOUNTER — Encounter: Attending: Physical Medicine & Rehabilitation | Admitting: Registered Nurse

## 2024-06-19 ENCOUNTER — Other Ambulatory Visit (HOSPITAL_COMMUNITY): Payer: Self-pay

## 2024-06-19 ENCOUNTER — Other Ambulatory Visit: Payer: Self-pay

## 2024-06-19 ENCOUNTER — Encounter: Payer: Self-pay | Admitting: Registered Nurse

## 2024-06-19 VITALS — BP 133/84 | HR 81 | Ht 64.0 in | Wt 240.4 lb

## 2024-06-19 DIAGNOSIS — M7061 Trochanteric bursitis, right hip: Secondary | ICD-10-CM | POA: Diagnosis not present

## 2024-06-19 DIAGNOSIS — Z5181 Encounter for therapeutic drug level monitoring: Secondary | ICD-10-CM | POA: Insufficient documentation

## 2024-06-19 DIAGNOSIS — G8929 Other chronic pain: Secondary | ICD-10-CM | POA: Insufficient documentation

## 2024-06-19 DIAGNOSIS — G894 Chronic pain syndrome: Secondary | ICD-10-CM | POA: Insufficient documentation

## 2024-06-19 DIAGNOSIS — M546 Pain in thoracic spine: Secondary | ICD-10-CM | POA: Insufficient documentation

## 2024-06-19 DIAGNOSIS — M1711 Unilateral primary osteoarthritis, right knee: Secondary | ICD-10-CM | POA: Diagnosis not present

## 2024-06-19 DIAGNOSIS — Z79891 Long term (current) use of opiate analgesic: Secondary | ICD-10-CM | POA: Diagnosis not present

## 2024-06-19 DIAGNOSIS — M545 Low back pain, unspecified: Secondary | ICD-10-CM | POA: Diagnosis not present

## 2024-06-19 MED ORDER — CELECOXIB 200 MG PO CAPS
200.0000 mg | ORAL_CAPSULE | Freq: Two times a day (BID) | ORAL | 0 refills | Status: AC
  Filled 2024-06-19: qty 60, 30d supply, fill #0

## 2024-06-19 MED ORDER — OXYCODONE HCL 10 MG PO TABS
10.0000 mg | ORAL_TABLET | Freq: Every day | ORAL | 0 refills | Status: DC | PRN
  Filled 2024-06-19: qty 150, 30d supply, fill #0

## 2024-06-19 MED ORDER — METHOCARBAMOL 500 MG PO TABS
500.0000 mg | ORAL_TABLET | Freq: Four times a day (QID) | ORAL | 0 refills | Status: AC
  Filled 2024-06-19: qty 60, 8d supply, fill #0

## 2024-06-19 NOTE — Progress Notes (Signed)
 Subjective:    Patient ID: Desiree Russell, female    DOB: February 24, 1971, 53 y.o.   MRN: [id number]  HPI: Desiree Russell is a 53 y.o. female who returns for follow up appointment for chronic pain and medication refill. She states her pain is located in her  mid- lower back and right hip. She rates her pain 6. Her current exercise regime is walking and performing stretching exercises.  Ms. Stefanick Morphine  equivalent is 75.00 MME.   Last Oral Swab was Performed on 05/20/2024, it was consistent.    Pain Inventory Average Pain 5 Pain Right Now 6 My pain is constant, dull, and aching  In the last 24 hours, has pain interfered with the following? General activity 7 Relation with others 1 Enjoyment of life 7 What TIME of day is your pain at its worst? morning  and night Sleep (in general) Fair  Pain is worse with: bending and standing Pain improves with: rest, heat/ice, and medication Relief from Meds: 5-6  Family History  Problem Relation Age of Onset   Hypertension Mother    Arthritis Mother    Depression Mother    Hypertension Father    Heart attack Father 33   Anxiety disorder Sister    Depression Sister    Healthy Sister    Healthy Sister    Hypertension Brother    Diabetes type II Brother    Anxiety disorder Brother    COPD Brother    COPD Brother    Healthy Brother    Healthy Brother    Breast cancer Maternal Aunt    Throat cancer Maternal Uncle    Hypertension Maternal Grandmother    Arthritis Maternal Grandmother    Diabetes Paternal Grandmother    Healthy Daughter    Healthy Son    Healthy Son    Healthy Son    Hearing loss Son    Colon cancer Neg Hx    Pancreatic cancer Neg Hx    Stomach cancer Neg Hx    Liver disease Neg Hx    Esophageal cancer Neg Hx    Rectal cancer Neg Hx    Social History   Socioeconomic History   Marital status: Married    Spouse name: Not on file   Number of children: 6   Years of education: Not on file   Highest education  level: Bachelor's degree (e.g., BA, AB, BS)  Occupational History   Not on file  Tobacco Use   Smoking status: Never   Smokeless tobacco: Never  Vaping Use   Vaping status: Never Used  Substance and Sexual Activity   Alcohol use: Yes    Alcohol/week: 1.0 standard drink of alcohol    Types: 1 Glasses of wine per week    Comment: occ    Drug use: No   Sexual activity: Yes    Partners: Male    Birth control/protection: Surgical  Other Topics Concern   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     Social Drivers of Corporate Investment Banker Strain: Low Risk  (11/04/2023)   Overall Financial Resource Strain (CARDIA)    Difficulty of Paying Living Expenses: Not hard at all  Food Insecurity: No Food Insecurity (12/11/2023)   Hunger Vital Sign    Worried About Running Out of Food in the Last Year: Never true    Ran Out of Food in the Last Year: Never true  Transportation Needs: No Transportation Needs (12/11/2023)   PRAPARE - Administrator, Civil Service (Medical): No    Lack of Transportation (Non-Medical): No  Physical Activity: Sufficiently Active (11/04/2023)   Exercise Vital Sign    Days of Exercise per Week: 5 days    Minutes of Exercise per Session: 30 min  Stress: No Stress Concern Present (11/04/2023)   [name] of Occupational Health - Occupational Stress Questionnaire    Feeling of Stress : Only a little  Social Connections: Moderately Integrated (11/04/2023)   Social Connection and Isolation Panel    Frequency of Communication with Friends and Family: Three times a week    Frequency of Social Gatherings with Friends and Family: Once a week    Attends Religious Services: More than 4 times per year    Active Member of Clubs or Organizations: No    Attends Engineer, Structural: Not on file    Marital Status: Married   Past Surgical History:  Procedure Laterality Date   ABDOMINAL  HYSTERECTOMY     CHOLECYSTECTOMY     CHOLECYSTECTOMY, LAPAROSCOPIC     COLONOSCOPY     KNEE ARTHROSCOPY Right 01/02/2023   TUBAL LIGATION  2000   UPPER GASTROINTESTINAL ENDOSCOPY     Past Surgical History:  Procedure Laterality Date   ABDOMINAL HYSTERECTOMY     CHOLECYSTECTOMY     CHOLECYSTECTOMY, LAPAROSCOPIC     COLONOSCOPY     KNEE ARTHROSCOPY Right 01/02/2023   TUBAL LIGATION  2000   UPPER GASTROINTESTINAL ENDOSCOPY     Past Medical History:  Diagnosis Date   Allergy    Anemia    Anxiety    Arthritis    Depression Lifelong   Dyslipidemia    Fibromyalgia    GERD (gastroesophageal reflux disease)    Headache    Hyperlipidemia 2017   Migraines    Pericarditis    Stroke (HCC) 12/2020   Tachycardia    Thyroid  disease 2003   TIA (transient ischemic attack)    Recurrent   Ulcer Unknown   BP 133/84 (BP Location: Left Arm, Patient Position: Sitting, Cuff Size: Large)   Pulse 81   Ht 5' 4 (1.626 m)   Wt 240 lb 6.4 oz (109 kg)   SpO2 91%   BMI 41.26 kg/m   Opioid Risk Score:   Fall Risk Score:  `1  Depression screen PHQ 2/9     05/20/2024    1:58 PM 03/31/2024    1:13 PM 03/11/2024   11:01 AM 12/30/2023    3:33 PM 12/17/2023    3:22 PM 11/04/2023    3:11 PM 10/17/2023    3:12 PM  Depression screen PHQ 2/9  Decreased Interest 0 0 0 1 0 0 0  Down, Depressed, Hopeless 0 0 0 0 0 0 0  PHQ - 2 Score 0 0 0 1 0 0 0  Altered sleeping      0   Tired, decreased energy      0   Change in appetite      0   Feeling bad or failure about yourself       0   Trouble concentrating      0   Moving slowly or fidgety/restless      0   Suicidal thoughts      0   PHQ-9 Score      0    Difficult doing work/chores      Not difficult at all  Data saved with a previous flowsheet row definition      Review of Systems  Musculoskeletal:  Positive for back pain.       Right hip and back pain  All other systems reviewed and are negative.      Objective:   Physical  Exam Vitals and nursing note reviewed.  Constitutional:      Appearance: Normal appearance.  Cardiovascular:     Rate and Rhythm: Normal rate and regular rhythm.     Pulses: Normal pulses.     Heart sounds: Normal heart sounds.  Pulmonary:     Effort: Pulmonary effort is normal.     Breath sounds: Normal breath sounds.  Musculoskeletal:     Comments: Normal Muscle Bulk and Muscle Testing Reveals:  Upper Extremities: Decreased ROM 90 Degrees and Muscle Strength 5/5  Lumbar Paraspinal Tenderness: L-4-L-5 Lower Extremities: Full ROM and Muscle Strength 5/5 Arises from Table slowly Narrow Based  Gait     Skin:    General: Skin is warm and dry.  Neurological:     Mental Status: She is alert and oriented to person, place, and time.  Psychiatric:        Mood and Affect: Mood normal.        Behavior: Behavior normal.          Assessment & Plan:  Chronic Bilateral Thoracic Back Pain/ Lumbo Sacral Spondylosis: Chronic Bilateral Low Back Pain: Continue HEP as Tolerated. Continue to Monitor. S/P On 09/12/2023, with Dr Carilyn. Bilateral Lumbar L3, L4 medial branch blocks and L 5 dorsal ramus injection under fluoroscopic guidance . Good relief noted. 06/19/2024 2. Chronic Right  Hip Pain/ Bilateral Greater Trochanter Bursitis:  Continue HEP as Tolerated. Continue to Monitor. 06/19/2024 3. Primary Osteoarthritis Right Knee: ortho Following. S/P Total Right Knee Replacement Continue to Monitor. 06/19/2024 4. Chronic Pain Syndrome: Refilled:  Oxycodone  10 mg one tablet 5 times a day as needed for pain #150 We will continue the opioid monitoring program, this consists of regular clinic visits, examinations, urine drug screen, pill counts as well as use of [place]  Controlled Substance Reporting system. A 12 month History has been reviewed on the [place]  Controlled Substance Reporting System on 06/19/2024.    F/U in 1 month

## 2024-06-30 ENCOUNTER — Other Ambulatory Visit (HOSPITAL_COMMUNITY): Payer: Self-pay

## 2024-06-30 ENCOUNTER — Other Ambulatory Visit: Payer: Self-pay | Admitting: Gastroenterology

## 2024-06-30 ENCOUNTER — Other Ambulatory Visit: Payer: Self-pay

## 2024-06-30 ENCOUNTER — Other Ambulatory Visit: Payer: Self-pay | Admitting: Cardiology

## 2024-06-30 DIAGNOSIS — K2100 Gastro-esophageal reflux disease with esophagitis, without bleeding: Secondary | ICD-10-CM

## 2024-06-30 MED ORDER — PANTOPRAZOLE SODIUM 40 MG PO TBEC
40.0000 mg | DELAYED_RELEASE_TABLET | Freq: Every day | ORAL | 0 refills | Status: AC
  Filled 2024-06-30: qty 90, 90d supply, fill #0

## 2024-07-02 ENCOUNTER — Other Ambulatory Visit (HOSPITAL_COMMUNITY): Payer: Self-pay

## 2024-07-02 MED ORDER — METOPROLOL SUCCINATE ER 50 MG PO TB24
50.0000 mg | ORAL_TABLET | Freq: Every day | ORAL | 0 refills | Status: DC
  Filled 2024-07-02: qty 15, 15d supply, fill #0

## 2024-07-03 ENCOUNTER — Other Ambulatory Visit (HOSPITAL_COMMUNITY): Payer: Self-pay

## 2024-07-03 MED ORDER — ONDANSETRON 4 MG PO TBDP
4.0000 mg | ORAL_TABLET | Freq: Three times a day (TID) | ORAL | 0 refills | Status: AC
  Filled 2024-07-03: qty 45, 15d supply, fill #0

## 2024-07-03 NOTE — Addendum Note (Signed)
 Addended by: Ingeborg Fite on: 07/03/2024 11:59 AM   Modules accepted: Orders

## 2024-07-04 DIAGNOSIS — E66813 Obesity, class 3: Secondary | ICD-10-CM | POA: Diagnosis not present

## 2024-07-07 ENCOUNTER — Other Ambulatory Visit: Payer: Self-pay

## 2024-07-07 ENCOUNTER — Other Ambulatory Visit (HOSPITAL_COMMUNITY): Payer: Self-pay

## 2024-07-07 NOTE — Telephone Encounter (Unsigned)
 Copied from CRM #[id number]. Topic: Clinical - Medication Refill >> Jul 07, 2024  4:59 PM Delon DASEN wrote: Discharged from original prescriber Medication: metoprolol  succinate (TOPROL  XL) 50 MG 24 hr tablet-  90 day supply   Has the patient contacted their pharmacy? Yes (Agent: If no, request that the patient contact the pharmacy for the refill. If patient does not wish to contact the pharmacy document the reason why and proceed with request.) (Agent: If yes, when and what did the pharmacy advise?)  This is the patient's preferred pharmacy:  [place] [facility] [address] [place] [address] Phone: [phone] Fax: [phone]  Is this the correct pharmacy for this prescription? Yes If no, delete pharmacy and type the correct one.   Has the prescription been filled recently? Yes  Is the patient out of the medication? Yes  Has the patient been seen for an appointment in the last year OR does the patient have an upcoming appointment? Yes  Can we respond through MyChart? Yes  Agent: Please be advised that Rx refills may take up to 3 business days. We ask that you follow-up with your pharmacy.

## 2024-07-08 ENCOUNTER — Other Ambulatory Visit (HOSPITAL_COMMUNITY): Payer: Self-pay

## 2024-07-08 MED ORDER — METOPROLOL SUCCINATE ER 50 MG PO TB24
50.0000 mg | ORAL_TABLET | Freq: Every day | ORAL | 0 refills | Status: DC
  Filled 2024-07-08 – 2024-07-16 (×2): qty 15, 15d supply, fill #0

## 2024-07-16 ENCOUNTER — Other Ambulatory Visit: Payer: Self-pay

## 2024-07-16 ENCOUNTER — Other Ambulatory Visit (HOSPITAL_COMMUNITY): Payer: Self-pay

## 2024-07-16 ENCOUNTER — Encounter: Attending: Physical Medicine & Rehabilitation | Admitting: Registered Nurse

## 2024-07-16 ENCOUNTER — Encounter: Payer: Self-pay | Admitting: Registered Nurse

## 2024-07-16 ENCOUNTER — Other Ambulatory Visit (HOSPITAL_COMMUNITY): Payer: Self-pay | Admitting: Psychiatry

## 2024-07-16 VITALS — BP 151/98 | HR 88 | Ht 64.0 in | Wt 239.0 lb

## 2024-07-16 DIAGNOSIS — Z5181 Encounter for therapeutic drug level monitoring: Secondary | ICD-10-CM

## 2024-07-16 DIAGNOSIS — Z79891 Long term (current) use of opiate analgesic: Secondary | ICD-10-CM | POA: Diagnosis present

## 2024-07-16 DIAGNOSIS — G8929 Other chronic pain: Secondary | ICD-10-CM | POA: Insufficient documentation

## 2024-07-16 DIAGNOSIS — M1711 Unilateral primary osteoarthritis, right knee: Secondary | ICD-10-CM

## 2024-07-16 DIAGNOSIS — M25562 Pain in left knee: Secondary | ICD-10-CM

## 2024-07-16 DIAGNOSIS — M545 Low back pain, unspecified: Secondary | ICD-10-CM

## 2024-07-16 DIAGNOSIS — G894 Chronic pain syndrome: Secondary | ICD-10-CM | POA: Diagnosis present

## 2024-07-16 DIAGNOSIS — F331 Major depressive disorder, recurrent, moderate: Secondary | ICD-10-CM

## 2024-07-16 DIAGNOSIS — M546 Pain in thoracic spine: Secondary | ICD-10-CM | POA: Diagnosis present

## 2024-07-16 MED ORDER — OXYCODONE HCL 15 MG PO TABS
15.0000 mg | ORAL_TABLET | Freq: Four times a day (QID) | ORAL | 0 refills | Status: DC | PRN
  Filled 2024-07-16: qty 120, 30d supply, fill #0

## 2024-07-16 MED ORDER — METHYLPREDNISOLONE 4 MG PO TBPK
ORAL_TABLET | ORAL | 0 refills | Status: AC
  Filled 2024-07-16: qty 21, 6d supply, fill #0

## 2024-07-16 NOTE — Progress Notes (Unsigned)
 Subjective:    Patient ID: Desiree Russell, female    DOB: [birth year], 53 y.o.   MRN: [id number]  HPI: Desiree Russell is a 53 y.o. female who returns for follow up appointment for chronic pain and medication refill. states *** pain is located in  ***. rates pain ***. current exercise regime is walking and performing stretching exercises.  Desiree Russell Morphine  equivalent is *** MME.   Last Oral Swab was Performed on 05/20/2024, it was consistent.    Pain Inventory Average Pain 7 Pain Right Now 7 My pain is constant, dull, stabbing, and aching  In the last 24 hours, has pain interfered with the following? General activity 7 Relation with others 0 Enjoyment of life 5 What TIME of day is your pain at its worst? morning  and night Sleep (in general) Poor  Pain is worse with: walking, bending, and standing Pain improves with: rest, heat/ice, pacing activities, and medication Relief from Meds: 5  Family History  Problem Relation Age of Onset   Hypertension Mother    Arthritis Mother    Depression Mother    Hypertension Father    Heart attack Father 102   Anxiety disorder Sister    Depression Sister    Healthy Sister    Healthy Sister    Hypertension Brother    Diabetes type II Brother    Anxiety disorder Brother    COPD Brother    COPD Brother    Healthy Brother    Healthy Brother    Breast cancer Maternal Aunt    Throat cancer Maternal Uncle    Hypertension Maternal Grandmother    Arthritis Maternal Grandmother    Diabetes Paternal Grandmother    Healthy Daughter    Healthy Son    Healthy Son    Healthy Son    Hearing loss Son    Colon cancer Neg Hx    Pancreatic cancer Neg Hx    Stomach cancer Neg Hx    Liver disease Neg Hx    Esophageal cancer Neg Hx    Rectal cancer Neg Hx    Social History   Socioeconomic History   Marital status: Married    Spouse name: Not on file   Number of children: 6   Years of education: Not on file   Highest education level:  Bachelor's degree (e.g., BA, AB, BS)  Occupational History   Not on file  Tobacco Use   Smoking status: Never   Smokeless tobacco: Never  Vaping Use   Vaping status: Never Used  Substance and Sexual Activity   Alcohol use: Yes    Alcohol/week: 1.0 standard drink of alcohol    Types: 1 Glasses of wine per week    Comment: occ    Drug use: No   Sexual activity: Yes    Partners: Male    Birth control/protection: Surgical  Other Topics Concern   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     Social Drivers of Health   Tobacco Use: Low Risk (07/16/2024)   Patient History    Smoking Tobacco Use: Never    Smokeless Tobacco Use: Never    Passive Exposure: Not on file  Financial Resource Strain: Low Risk (11/04/2023)   Overall Financial Resource Strain (CARDIA)    Difficulty of Paying Living Expenses: Not hard at all  Food Insecurity: No Food Insecurity (12/11/2023)   Hunger Vital Sign  Worried About Programme Researcher, Broadcasting/film/video in the Last Year: Never true    Ran Out of Food in the Last Year: Never true  Transportation Needs: No Transportation Needs (12/11/2023)   PRAPARE - Administrator, Civil Service (Medical): No    Lack of Transportation (Non-Medical): No  Physical Activity: Sufficiently Active (11/04/2023)   Exercise Vital Sign    Days of Exercise per Week: 5 days    Minutes of Exercise per Session: 30 min  Stress: No Stress Concern Present (11/04/2023)   [name] of Occupational Health - Occupational Stress Questionnaire    Feeling of Stress : Only a little  Social Connections: Moderately Integrated (11/04/2023)   Social Connection and Isolation Panel    Frequency of Communication with Friends and Family: Three times a week    Frequency of Social Gatherings with Friends and Family: Once a week    Attends Religious Services: More than 4 times per year    Active Member of Clubs or Organizations: No    Attends  Engineer, Structural: Not on file    Marital Status: Married  Depression (PHQ2-9): Low Risk (05/20/2024)   Depression (PHQ2-9)    PHQ-2 Score: 0  Alcohol Screen: Low Risk (11/04/2023)   Alcohol Screen    Last Alcohol Screening Score (AUDIT): 1  Housing: Low Risk (12/11/2023)   Housing Stability Vital Sign    Unable to Pay for Housing in the Last Year: No    Number of Times Moved in the Last Year: 0    Homeless in the Last Year: No  Utilities: Not At Risk (12/11/2023)   AHC Utilities    Threatened with loss of utilities: No  Health Literacy: Adequate Health Literacy (11/04/2023)   B1300 Health Literacy    Frequency of need for help with medical instructions: Never   Past Surgical History:  Procedure Laterality Date   ABDOMINAL HYSTERECTOMY     CHOLECYSTECTOMY     CHOLECYSTECTOMY, LAPAROSCOPIC     COLONOSCOPY     KNEE ARTHROSCOPY Right 01/02/2023   TUBAL LIGATION  2000   UPPER GASTROINTESTINAL ENDOSCOPY     Past Surgical History:  Procedure Laterality Date   ABDOMINAL HYSTERECTOMY     CHOLECYSTECTOMY     CHOLECYSTECTOMY, LAPAROSCOPIC     COLONOSCOPY     KNEE ARTHROSCOPY Right 01/02/2023   TUBAL LIGATION  2000   UPPER GASTROINTESTINAL ENDOSCOPY     Past Medical History:  Diagnosis Date   Allergy    Anemia    Anxiety    Arthritis    Depression Lifelong   Dyslipidemia    Fibromyalgia    GERD (gastroesophageal reflux disease)    Headache    Hyperlipidemia 2017   Migraines    Pericarditis    Stroke (HCC) 12/2020   Tachycardia    Thyroid  disease 2003   TIA (transient ischemic attack)    Recurrent   Ulcer Unknown   BP (!) 153/93 (Patient Position: Sitting, Cuff Size: Large)   Pulse 88   Ht 5' 4 (1.626 m)   Wt 239 lb (108.4 kg)   SpO2 95%   BMI 41.02 kg/m   Opioid Risk Score:   Fall Risk Score:  `1  Depression screen PHQ 2/9     05/20/2024    1:58 PM 03/31/2024    1:13 PM 03/11/2024   11:01 AM 12/30/2023    3:33 PM 12/17/2023    3:22 PM 11/04/2023     3:11 PM 10/17/2023  3:12 PM  Depression screen PHQ 2/9  Decreased Interest 0 0 0 1 0 0 0  Down, Depressed, Hopeless 0 0 0 0 0 0 0  PHQ - 2 Score 0 0 0 1 0 0 0  Altered sleeping      0   Tired, decreased energy      0   Change in appetite      0   Feeling bad or failure about yourself       0   Trouble concentrating      0   Moving slowly or fidgety/restless      0   Suicidal thoughts      0   PHQ-9 Score      0    Difficult doing work/chores      Not difficult at all      Data saved with a previous flowsheet row definition      Review of Systems  Musculoskeletal:  Positive for arthralgias and myalgias.       Right hip and shoulder pain, back pain, left knee pain  All other systems reviewed and are negative.      Objective:   Physical Exam        Assessment & Plan:

## 2024-07-17 ENCOUNTER — Other Ambulatory Visit: Payer: Self-pay

## 2024-07-31 ENCOUNTER — Other Ambulatory Visit: Payer: Self-pay

## 2024-07-31 ENCOUNTER — Other Ambulatory Visit (HOSPITAL_COMMUNITY): Payer: Self-pay

## 2024-07-31 ENCOUNTER — Encounter (HOSPITAL_COMMUNITY): Payer: Self-pay | Admitting: Pharmacist

## 2024-07-31 MED ORDER — CELECOXIB 100 MG PO CAPS
100.0000 mg | ORAL_CAPSULE | Freq: Two times a day (BID) | ORAL | 2 refills | Status: AC
  Filled 2024-07-31: qty 60, 30d supply, fill #0

## 2024-07-31 MED ORDER — METHOCARBAMOL 500 MG PO TABS
500.0000 mg | ORAL_TABLET | Freq: Two times a day (BID) | ORAL | 2 refills | Status: AC | PRN
  Filled 2024-07-31: qty 60, 30d supply, fill #0

## 2024-08-02 MED ORDER — METOPROLOL SUCCINATE ER 50 MG PO TB24
50.0000 mg | ORAL_TABLET | Freq: Every day | ORAL | 0 refills | Status: DC
  Filled 2024-08-02: qty 15, 15d supply, fill #0

## 2024-08-03 ENCOUNTER — Other Ambulatory Visit: Payer: Self-pay

## 2024-08-03 ENCOUNTER — Other Ambulatory Visit (HOSPITAL_COMMUNITY): Payer: Self-pay

## 2024-08-03 DIAGNOSIS — Z8673 Personal history of transient ischemic attack (TIA), and cerebral infarction without residual deficits: Secondary | ICD-10-CM

## 2024-08-03 DIAGNOSIS — B009 Herpesviral infection, unspecified: Secondary | ICD-10-CM

## 2024-08-03 DIAGNOSIS — G43909 Migraine, unspecified, not intractable, without status migrainosus: Secondary | ICD-10-CM

## 2024-08-03 MED ORDER — TIZANIDINE HCL 4 MG PO TABS
4.0000 mg | ORAL_TABLET | Freq: Two times a day (BID) | ORAL | 1 refills | Status: DC | PRN
  Filled 2024-08-03: qty 60, 30d supply, fill #0

## 2024-08-04 ENCOUNTER — Ambulatory Visit

## 2024-08-04 MED ORDER — EMGALITY 120 MG/ML ~~LOC~~ SOAJ: 120.0000 mg | SUBCUTANEOUS | 2 refills | Status: AC

## 2024-08-04 MED ORDER — VALACYCLOVIR HCL 500 MG PO TABS: 500.0000 mg | ORAL_TABLET | Freq: Two times a day (BID) | ORAL | 1 refills | Status: DC

## 2024-08-04 MED ORDER — DICLOFENAC SODIUM 75 MG PO TBEC: 75.0000 mg | DELAYED_RELEASE_TABLET | Freq: Two times a day (BID) | ORAL | 0 refills | Status: AC | PRN

## 2024-08-04 MED ORDER — VEOZAH 45 MG PO TABS
1.0000 | ORAL_TABLET | Freq: Every day | ORAL | 0 refills | Status: AC
  Filled 2024-09-03: qty 30, 30d supply, fill #0

## 2024-08-04 MED ORDER — TIZANIDINE HCL 4 MG PO TABS: 4.0000 mg | ORAL_TABLET | Freq: Two times a day (BID) | ORAL | 1 refills | Status: DC | PRN

## 2024-08-04 MED ORDER — EZETIMIBE 10 MG PO TABS: 10.0000 mg | ORAL_TABLET | Freq: Every day | ORAL | 1 refills | Status: AC

## 2024-08-04 MED ORDER — CLOPIDOGREL BISULFATE 75 MG PO TABS: 75.0000 mg | ORAL_TABLET | Freq: Every day | ORAL | 0 refills | Status: AC

## 2024-08-04 MED ORDER — ONDANSETRON HCL 4 MG PO TABS: 4.0000 mg | ORAL_TABLET | Freq: Three times a day (TID) | ORAL | 2 refills | Status: AC | PRN

## 2024-08-04 MED ORDER — PREGABALIN 150 MG PO CAPS: 300.0000 mg | ORAL_CAPSULE | Freq: Every day | ORAL | 0 refills | Status: AC

## 2024-08-04 MED ORDER — UBRELVY 100 MG PO TABS: ORAL_TABLET | ORAL | 1 refills | Status: AC

## 2024-08-04 MED ORDER — METOPROLOL SUCCINATE ER 50 MG PO TB24: 50.0000 mg | ORAL_TABLET | Freq: Every day | ORAL | 0 refills | Status: DC

## 2024-08-04 NOTE — Telephone Encounter (Signed)
 Medications has been pended and sent to provider for approval with new pharmacy.

## 2024-08-05 ENCOUNTER — Ambulatory Visit

## 2024-08-05 ENCOUNTER — Other Ambulatory Visit (HOSPITAL_COMMUNITY): Payer: Self-pay

## 2024-08-05 ENCOUNTER — Other Ambulatory Visit: Payer: Self-pay

## 2024-08-06 ENCOUNTER — Other Ambulatory Visit: Payer: Self-pay

## 2024-08-06 DIAGNOSIS — B009 Herpesviral infection, unspecified: Secondary | ICD-10-CM

## 2024-08-06 MED ORDER — VALACYCLOVIR HCL 500 MG PO TABS: 500.0000 mg | ORAL_TABLET | Freq: Two times a day (BID) | ORAL | 1 refills | Status: AC

## 2024-08-06 MED ORDER — TIZANIDINE HCL 4 MG PO TABS: 4.0000 mg | ORAL_TABLET | Freq: Two times a day (BID) | ORAL | 0 refills | Status: AC | PRN

## 2024-08-06 MED ORDER — METOPROLOL SUCCINATE ER 50 MG PO TB24: 50.0000 mg | ORAL_TABLET | Freq: Every day | ORAL | 0 refills | Status: DC

## 2024-08-06 NOTE — Telephone Encounter (Signed)
 Patient is requesting 90 day supply of medications be sent to [facility] order pharmacy. Can you send 90 days of these 3 medications?

## 2024-08-10 ENCOUNTER — Other Ambulatory Visit (HOSPITAL_COMMUNITY): Payer: Self-pay

## 2024-08-10 ENCOUNTER — Telehealth (HOSPITAL_COMMUNITY): Admitting: Psychiatry

## 2024-08-10 ENCOUNTER — Other Ambulatory Visit: Payer: Self-pay

## 2024-08-10 ENCOUNTER — Encounter (HOSPITAL_COMMUNITY): Payer: Self-pay | Admitting: Psychiatry

## 2024-08-10 VITALS — Wt 239.0 lb

## 2024-08-10 DIAGNOSIS — F331 Major depressive disorder, recurrent, moderate: Secondary | ICD-10-CM

## 2024-08-10 DIAGNOSIS — F4310 Post-traumatic stress disorder, unspecified: Secondary | ICD-10-CM | POA: Diagnosis not present

## 2024-08-10 DIAGNOSIS — F419 Anxiety disorder, unspecified: Secondary | ICD-10-CM

## 2024-08-10 MED ORDER — FLUOXETINE HCL 20 MG PO CAPS
20.0000 mg | ORAL_CAPSULE | Freq: Every evening | ORAL | 0 refills | Status: AC
  Filled 2024-08-10 – 2024-09-03 (×2): qty 90, 90d supply, fill #0

## 2024-08-10 MED ORDER — VORTIOXETINE HBR 20 MG PO TABS
20.0000 mg | ORAL_TABLET | Freq: Every day | ORAL | 0 refills | Status: AC
  Filled 2024-09-03: qty 90, 90d supply, fill #0

## 2024-08-10 MED ORDER — OLANZAPINE 5 MG PO TABS
5.0000 mg | ORAL_TABLET | Freq: Every evening | ORAL | 0 refills | Status: AC
  Filled 2024-08-10 – 2024-09-03 (×2): qty 90, 90d supply, fill #0

## 2024-08-10 MED ORDER — LAMOTRIGINE 25 MG PO TABS
50.0000 mg | ORAL_TABLET | Freq: Every day | ORAL | 0 refills | Status: AC
  Filled 2024-08-10 – 2024-09-03 (×2): qty 180, 90d supply, fill #0
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
## 2024-08-11 ENCOUNTER — Other Ambulatory Visit (HOSPITAL_COMMUNITY): Payer: Self-pay

## 2024-08-13 ENCOUNTER — Other Ambulatory Visit (HOSPITAL_COMMUNITY): Payer: Self-pay

## 2024-08-14 ENCOUNTER — Encounter: Attending: Physical Medicine & Rehabilitation | Admitting: Registered Nurse

## 2024-08-14 ENCOUNTER — Ambulatory Visit: Admitting: Physician Assistant

## 2024-08-14 ENCOUNTER — Other Ambulatory Visit (HOSPITAL_COMMUNITY): Payer: Self-pay

## 2024-08-14 ENCOUNTER — Encounter: Payer: Self-pay | Admitting: Registered Nurse

## 2024-08-14 VITALS — BP 140/89 | HR 82 | Ht 64.0 in | Wt 239.8 lb

## 2024-08-14 DIAGNOSIS — M25551 Pain in right hip: Secondary | ICD-10-CM | POA: Diagnosis not present

## 2024-08-14 DIAGNOSIS — G894 Chronic pain syndrome: Secondary | ICD-10-CM | POA: Insufficient documentation

## 2024-08-14 DIAGNOSIS — G8929 Other chronic pain: Secondary | ICD-10-CM | POA: Insufficient documentation

## 2024-08-14 DIAGNOSIS — M545 Low back pain, unspecified: Secondary | ICD-10-CM | POA: Diagnosis not present

## 2024-08-14 DIAGNOSIS — M546 Pain in thoracic spine: Secondary | ICD-10-CM | POA: Insufficient documentation

## 2024-08-14 DIAGNOSIS — M1711 Unilateral primary osteoarthritis, right knee: Secondary | ICD-10-CM | POA: Insufficient documentation

## 2024-08-14 MED ORDER — OXYCODONE HCL 10 MG PO TABS: 10.0000 mg | ORAL_TABLET | Freq: Every day | ORAL | 0 refills | Status: AC | PRN

## 2024-08-14 NOTE — Patient Instructions (Addendum)
 Ask Pharmacist regarding cost . Butrans   ( Buprenorphine ) Patch 5 mg weekly #4

## 2024-08-14 NOTE — Progress Notes (Signed)
 "  Subjective:    Patient ID: Desiree Russell, female    DOB: February 05, 1971, 54 y.o.   MRN: [id number]  HPI: Desiree Russell is a 54 y.o. female who returns for follow up appointment for chronic pain and medication refill. She states her pain is located in her mid- lower back and right hip pain. She rates her pain 6. Her current exercise regime is walking and performing stretching exercises.  [name] Morphine  equivalent is 90.00 MME.  [name] reports pruritus  with Oxycodone  15 mg , oxycodone  15 mg discontinued and Oxycodone  10 mg resumed. She verbalizes understanding.     Last Oral Swab was Performed on 05/20/2024, it was consistent.      Pain Inventory Average Pain 5 Pain Right Now 6 My pain is constant, sharp, and aching  In the last 24 hours, has pain interfered with the following? General activity 8 Relation with others 2 Enjoyment of life 6 What TIME of day is your pain at its worst? morning  and night Sleep (in general) Fair  Pain is worse with: walking, bending, standing, and some activites Pain improves with: rest, heat/ice, and medication Relief from Meds: 5     Family History  Problem Relation Age of Onset   Hypertension Mother    Arthritis Mother    Depression Mother    Hypertension Father    Heart attack Father 60   Anxiety disorder Sister    Depression Sister    Healthy Sister    Healthy Sister    Hypertension Brother    Diabetes type II Brother    Anxiety disorder Brother    COPD Brother    COPD Brother    Healthy Brother    Healthy Brother    Breast cancer Maternal Aunt    Throat cancer Maternal Uncle    Hypertension Maternal Grandmother    Arthritis Maternal Grandmother    Diabetes Paternal Grandmother    Healthy Daughter    Healthy Son    Healthy Son    Healthy Son    Hearing loss Son    Colon cancer Neg Hx    Pancreatic cancer Neg Hx    Stomach cancer Neg Hx    Liver disease Neg Hx    Esophageal cancer Neg Hx    Rectal cancer Neg Hx     Social History   Socioeconomic History   Marital status: Married    Spouse name: Not on file   Number of children: 6   Years of education: Not on file   Highest education level: Bachelor's degree (e.g., BA, AB, BS)  Occupational History   Not on file  Tobacco Use   Smoking status: Never   Smokeless tobacco: Never  Vaping Use   Vaping status: Never Used  Substance and Sexual Activity   Alcohol use: Yes    Alcohol/week: 1.0 standard drink of alcohol    Types: 1 Glasses of wine per week    Comment: occ    Drug use: No   Sexual activity: Yes    Partners: Male    Birth control/protection: Surgical  Other Topics Concern   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     Social Drivers of Health   Tobacco Use: Low Risk (08/14/2024)   Patient History    Smoking Tobacco Use: Never    Smokeless Tobacco Use: Never    Passive Exposure: Not on file  Financial Resource Strain: Low Risk (11/04/2023)   Overall Financial Resource Strain (CARDIA)    Difficulty of Paying Living Expenses: Not hard at all  Food Insecurity: No Food Insecurity (12/11/2023)   Hunger Vital Sign    Worried About Running Out of Food in the Last Year: Never true    Ran Out of Food in the Last Year: Never true  Transportation Needs: No Transportation Needs (12/11/2023)   PRAPARE - Administrator, Civil Service (Medical): No    Lack of Transportation (Non-Medical): No  Physical Activity: Sufficiently Active (11/04/2023)   Exercise Vital Sign    Days of Exercise per Week: 5 days    Minutes of Exercise per Session: 30 min  Stress: No Stress Concern Present (11/04/2023)   [name] of Occupational Health - Occupational Stress Questionnaire    Feeling of Stress : Only a little  Social Connections: Moderately Integrated (11/04/2023)   Social Connection and Isolation Panel    Frequency of Communication with Friends and Family: Three times a week     Frequency of Social Gatherings with Friends and Family: Once a week    Attends Religious Services: More than 4 times per year    Active Member of Clubs or Organizations: No    Attends Engineer, Structural: Not on file    Marital Status: Married  Depression (PHQ2-9): Low Risk (05/20/2024)   Depression (PHQ2-9)    PHQ-2 Score: 0  Alcohol Screen: Low Risk (11/04/2023)   Alcohol Screen    Last Alcohol Screening Score (AUDIT): 1  Housing: Low Risk (12/11/2023)   Housing Stability Vital Sign    Unable to Pay for Housing in the Last Year: No    Number of Times Moved in the Last Year: 0    Homeless in the Last Year: No  Utilities: Not At Risk (12/11/2023)   AHC Utilities    Threatened with loss of utilities: No  Health Literacy: Adequate Health Literacy (11/04/2023)   B1300 Health Literacy    Frequency of need for help with medical instructions: Never   Past Surgical History:  Procedure Laterality Date   ABDOMINAL HYSTERECTOMY     CHOLECYSTECTOMY     CHOLECYSTECTOMY, LAPAROSCOPIC     COLONOSCOPY     KNEE ARTHROSCOPY Right 01/02/2023   TUBAL LIGATION  2000   UPPER GASTROINTESTINAL ENDOSCOPY     Past Medical History:  Diagnosis Date   Allergy    Anemia    Anxiety    Arthritis    Depression Lifelong   Dyslipidemia    Fibromyalgia    GERD (gastroesophageal reflux disease)    Headache    Hyperlipidemia 2017   Migraines    Pericarditis    Stroke (HCC) 12/2020   Tachycardia    Thyroid  disease 2003   TIA (transient ischemic attack)    Recurrent   Ulcer Unknown   BP (!) 140/89 (BP Location: Left Arm, Patient Position: Sitting, Cuff Size: Large)   Pulse 82   Ht 5' 4 (1.626 m)   Wt 239 lb 12.8 oz (108.8 kg)   SpO2 99%   BMI 41.16 kg/m   Opioid Risk Score:   Fall Risk Score:  `1  Depression screen PHQ 2/9     05/20/2024    1:58 PM 03/31/2024    1:13 PM 03/11/2024   11:01 AM 12/30/2023    3:33 PM 12/17/2023    3:22 PM 11/04/2023    3:11 PM 10/17/2023    3:12 PM  Depression screen PHQ 2/9  Decreased Interest 0 0 0 1 0 0 0  Down, Depressed, Hopeless 0 0 0 0 0 0 0  PHQ - 2 Score 0 0 0 1 0 0 0  Altered sleeping      0   Tired, decreased energy      0   Change in appetite      0   Feeling bad or failure about yourself       0   Trouble concentrating      0   Moving slowly or fidgety/restless      0   Suicidal thoughts      0   PHQ-9 Score      0    Difficult doing work/chores      Not difficult at all      Data saved with a previous flowsheet row definition    Review of Systems  Musculoskeletal:  Positive for back pain and myalgias.       Back pain, bilateral leg and foot pain, widespread myalgia's  All other systems reviewed and are negative.      Objective:   Physical Exam Vitals and nursing note reviewed.  Constitutional:      Appearance: Normal appearance. She is obese.  Cardiovascular:     Rate and Rhythm: Normal rate and regular rhythm.     Pulses: Normal pulses.     Heart sounds: Normal heart sounds.  Pulmonary:     Effort: Pulmonary effort is normal.     Breath sounds: Normal breath sounds.  Musculoskeletal:     Comments: Normal Muscle Bulk and Muscle Testing Reveals:  Upper Extremities: Full ROM and Muscle Strength 5/5 Bilateral AC Joint Tenderness  Thoracic and Lumbar Hypersensitivity Right Greater Trochanter Tenderness Lower Extremities: Full ROM and Muscle Strength 5/5 Arises from Table slowly Antalgic  Gait     Skin:    General: Skin is warm and dry.  Neurological:     Mental Status: She is alert and oriented to person, place, and time.  Psychiatric:        Behavior: Behavior normal.          Assessment & Plan:  Chronic Bilateral Thoracic Back Pain/ Lumbo Sacral Spondylosis: Chronic Bilateral Low Back Pain: Continue HEP as Tolerated. Continue to Monitor. S/P On 09/12/2023, with Dr Carilyn. Bilateral Lumbar L3, L4 medial branch blocks and L 5 dorsal ramus injection under fluoroscopic guidance . Good relief  noted. 08/14/2024 2. Chronic Right  Hip Pain/ Bilateral Greater Trochanter Bursitis:  Continue HEP as Tolerated. Continue to Monitor. 08/14/2024 3. Primary Osteoarthritis Right Knee: ortho Following. S/P Total Right Knee Replacement Continue to Monitor. 08/14/2024 4. Chronic Pain Syndrome: Refilled:  Oxycodone  10 mg one tablet 5 times a day as needed for pain #150 We will continue the opioid monitoring program, this consists of regular clinic visits, examinations, urine drug screen, pill counts as well as use of [place]  Controlled Substance Reporting system. A 12 month History has been reviewed on the [place]  Controlled Substance Reporting System on 08/14/2024.    F/U in 1 month      "

## 2024-08-22 ENCOUNTER — Other Ambulatory Visit: Payer: Self-pay

## 2024-08-22 ENCOUNTER — Telehealth: Payer: Self-pay

## 2024-08-22 DIAGNOSIS — G894 Chronic pain syndrome: Secondary | ICD-10-CM

## 2024-08-22 DIAGNOSIS — M545 Low back pain, unspecified: Secondary | ICD-10-CM

## 2024-08-22 DIAGNOSIS — M1711 Unilateral primary osteoarthritis, right knee: Secondary | ICD-10-CM

## 2024-08-22 DIAGNOSIS — G8929 Other chronic pain: Secondary | ICD-10-CM

## 2024-08-22 NOTE — Telephone Encounter (Signed)
 Patient is requesting Butrans  Patches. Please send to [facility].  Last visit Next visit 1/16 next 2/16 Call back phone [phone].

## 2024-08-23 ENCOUNTER — Other Ambulatory Visit (HOSPITAL_COMMUNITY): Payer: Self-pay

## 2024-08-25 ENCOUNTER — Other Ambulatory Visit: Payer: Self-pay | Admitting: Physical Medicine and Rehabilitation

## 2024-08-25 ENCOUNTER — Ambulatory Visit

## 2024-08-25 VITALS — BP 118/82 | HR 73 | Temp 98.2°F | Ht 64.0 in | Wt 237.5 lb

## 2024-08-25 DIAGNOSIS — E785 Hyperlipidemia, unspecified: Secondary | ICD-10-CM

## 2024-08-25 DIAGNOSIS — E66813 Obesity, class 3: Secondary | ICD-10-CM | POA: Diagnosis not present

## 2024-08-25 DIAGNOSIS — Z6841 Body Mass Index (BMI) 40.0 and over, adult: Secondary | ICD-10-CM

## 2024-08-25 DIAGNOSIS — I10 Essential (primary) hypertension: Secondary | ICD-10-CM | POA: Diagnosis not present

## 2024-08-25 DIAGNOSIS — R6889 Other general symptoms and signs: Secondary | ICD-10-CM | POA: Diagnosis not present

## 2024-08-25 MED ORDER — BUPRENORPHINE 5 MCG/HR TD PTWK: 1.0000 | MEDICATED_PATCH | TRANSDERMAL | 0 refills | Status: AC

## 2024-08-25 MED ORDER — METOPROLOL SUCCINATE ER 50 MG PO TB24
50.0000 mg | ORAL_TABLET | Freq: Every day | ORAL | 1 refills | Status: AC
  Filled 2024-09-03: qty 90, 90d supply, fill #0

## 2024-08-25 MED ORDER — WEGOVY 1.5 MG PO TABS: 1.5000 mg | ORAL_TABLET | Freq: Every day | ORAL | 2 refills | Status: AC

## 2024-08-25 NOTE — Assessment & Plan Note (Signed)
 Essential hypertension Blood pressure was elevated during the visit. She is on metoprolol  50 mg daily. - Refilled metoprolol  50 mg daily with a 90-day supply and a refill. - Rechecked blood pressure before lab work. - Monitor blood pressure at home and report readings over the next week. Orders:   CBC with Differential

## 2024-08-25 NOTE — Progress Notes (Signed)
 "  Subjective:  Patient ID: Desiree Russell, female    DOB: 1970-11-14  Age: 54 y.o. MRN: [id number]  Chief Complaint  Patient presents with   Medical Management of Chronic Issues    HPI: Discussed the use of AI scribe software for clinical note transcription with the patient, who gave verbal consent to proceed.  Discussed the use of AI scribe software for clinical note transcription with the patient, who gave verbal consent to proceed.  History of Present Illness   Desiree Russell is a 54 year old female with fibromyalgia who presents with increased joint pain and heat intolerance. She is accompanied by her husband.  Generalized joint pain - Increased pain affecting all joints - Attributes pain to fibromyalgia and weather changes  Heat intolerance and associated symptoms - Heat intolerance - Night sweats - Dry, brittle hair - Dry, itchy skin  Thyroid  dysfunction - History of thyroid  medication, discontinued years ago when levels were normal - Believes normalization of levels was due to medication  Migraine management - Emgality  administered as subcutaneous injection once monthly - Significant reduction in migraine frequency to every three to four months - Emgality  costs $2,000 without insurance coverage  Mood stabilization - Prozac  and Lamictal  used for mood stabilization - Medications are effective  Seizure history - History of seizures - Last episode occurred in early 2025 - No recent seizure-like activities  Stroke history - History of stroke  Antihypertensive therapy - Metoprolol  50 mg daily - Recent refill of only 15 tablets due to follow-up visit requirement - Cardiology discharged her, advised primary care provider to manage medication  Asthma management - Bernida continues to be effective - Needs to meet insurance deductible - Uses copay card to reduce costs  Weight management - Considering Wegovy  for weight loss - Previously took Wegovy   Financial  concerns regarding medications - Trintellix  costs $300, uses copay card for Trintellix             08/25/2024    2:02 PM 05/20/2024    1:58 PM 03/31/2024    1:13 PM 03/11/2024   11:01 AM 12/30/2023    3:33 PM  Depression screen PHQ 2/9  Decreased Interest 0 0 0 0 1  Down, Depressed, Hopeless 0 0 0 0 0  PHQ - 2 Score 0 0 0 0 1  Altered sleeping 0      Tired, decreased energy 0      Change in appetite 0      Feeling bad or failure about yourself  0      Trouble concentrating 0      Moving slowly or fidgety/restless 0      Suicidal thoughts 0      PHQ-9 Score 0      Difficult doing work/chores Not difficult at all            08/25/2024    2:02 PM  Fall Risk   Falls in the past year? 1  Number falls in past yr: 1  Injury with Fall? 0  Risk for fall due to : History of fall(s)  Follow up Falls evaluation completed;Education provided;Falls prevention discussed    Patient Care Team: Griffith Santilli, MD as PCP - General (Family Medicine) Lavona Agent, MD as PCP - Cardiology (Cardiology)   Review of Systems  Constitutional:  Positive for unexpected weight change. Negative for chills, fatigue and fever.  HENT:  Negative for congestion, ear pain, sinus pressure and sore throat.   Respiratory:  Negative for cough and  shortness of breath.   Cardiovascular:  Negative for chest pain.  Gastrointestinal:  Negative for abdominal pain, constipation, diarrhea, nausea and vomiting.  Genitourinary:  Negative for dysuria and frequency.  Musculoskeletal:  Positive for arthralgias and myalgias. Negative for back pain.  Neurological:  Negative for dizziness and headaches.  Psychiatric/Behavioral:  Negative for dysphoric mood. The patient is not nervous/anxious.     Medications Ordered Prior to Encounter[1] Past Medical History:  Diagnosis Date   Allergy    Anemia    Anxiety    Arthritis    Depression Lifelong   Dyslipidemia    Fibromyalgia    GERD (gastroesophageal reflux disease)     Headache    Hyperlipidemia 2017   Migraines    Pericarditis    Stroke (HCC) 12/2020   Tachycardia    Thyroid  disease 2003   TIA (transient ischemic attack)    Recurrent   Ulcer Unknown   Past Surgical History:  Procedure Laterality Date   ABDOMINAL HYSTERECTOMY     CHOLECYSTECTOMY     CHOLECYSTECTOMY, LAPAROSCOPIC     COLONOSCOPY     KNEE ARTHROSCOPY Right 01/02/2023   TUBAL LIGATION  2000   UPPER GASTROINTESTINAL ENDOSCOPY      Family History  Problem Relation Age of Onset   Hypertension Mother    Arthritis Mother    Depression Mother    Hypertension Father    Heart attack Father 55   Anxiety disorder Sister    Depression Sister    Healthy Sister    Healthy Sister    Hypertension Brother    Diabetes type II Brother    Anxiety disorder Brother    COPD Brother    COPD Brother    Healthy Brother    Healthy Brother    Breast cancer Maternal Aunt    Throat cancer Maternal Uncle    Hypertension Maternal Grandmother    Arthritis Maternal Grandmother    Diabetes Paternal Grandmother    Healthy Daughter    Healthy Son    Healthy Son    Healthy Son    Hearing loss Son    Colon cancer Neg Hx    Pancreatic cancer Neg Hx    Stomach cancer Neg Hx    Liver disease Neg Hx    Esophageal cancer Neg Hx    Rectal cancer Neg Hx    Social History   Socioeconomic History   Marital status: Married    Spouse name: Not on file   Number of children: 6   Years of education: Not on file   Highest education level: Bachelor's degree (e.g., BA, AB, BS)  Occupational History   Not on file  Tobacco Use   Smoking status: Never   Smokeless tobacco: Never  Vaping Use   Vaping status: Never Used  Substance and Sexual Activity   Alcohol use: Yes    Alcohol/week: 1.0 standard drink of alcohol    Types: 1 Glasses of wine per week    Comment: occ    Drug use: No   Sexual activity: Yes    Partners: Male    Birth control/protection: Surgical  Other Topics Concern   Not on  file  Social History Narrative   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     Social Drivers of Health   Tobacco Use: Low Risk (08/25/2024)   Patient History    Smoking Tobacco Use: Never    Smokeless Tobacco Use: Never    Passive  Exposure: Not on file  Financial Resource Strain: Low Risk (08/25/2024)   Overall Financial Resource Strain (CARDIA)    Difficulty of Paying Living Expenses: Not hard at all  Food Insecurity: No Food Insecurity (08/25/2024)   Epic    Worried About Programme Researcher, Broadcasting/film/video in the Last Year: Never true    Ran Out of Food in the Last Year: Never true  Transportation Needs: No Transportation Needs (08/25/2024)   Epic    Lack of Transportation (Medical): No    Lack of Transportation (Non-Medical): No  Physical Activity: Insufficiently Active (08/25/2024)   Exercise Vital Sign    Days of Exercise per Week: 3 days    Minutes of Exercise per Session: 30 min  Stress: No Stress Concern Present (08/25/2024)   [name] of Occupational Health - Occupational Stress Questionnaire    Feeling of Stress: Only a little  Social Connections: Moderately Isolated (08/25/2024)   Social Connection and Isolation Panel    Frequency of Communication with Friends and Family: Three times a week    Frequency of Social Gatherings with Friends and Family: Patient declined    Attends Religious Services: Patient declined    Active Member of Clubs or Organizations: No    Attends Engineer, Structural: Not on file    Marital Status: Married  Depression (PHQ2-9): Low Risk (08/25/2024)   Depression (PHQ2-9)    PHQ-2 Score: 0  Alcohol Screen: Low Risk (08/25/2024)   Alcohol Screen    Last Alcohol Screening Score (AUDIT): 2  Housing: Low Risk (08/25/2024)   Epic    Unable to Pay for Housing in the Last Year: No    Number of Times Moved in the Last Year: 0    Homeless in the Last Year: No  Utilities: Not At Risk (12/11/2023)   AHC Utilities    Threatened with loss  of utilities: No  Health Literacy: Adequate Health Literacy (11/04/2023)   B1300 Health Literacy    Frequency of need for help with medical instructions: Never    Objective:  BP (!) 140/88   Pulse 73   Temp 98.2 F (36.8 C)   Ht 5' 4 (1.626 m)   Wt 237 lb 8 oz (107.7 kg)   SpO2 97%   BMI 40.77 kg/m      08/25/2024    1:59 PM 08/14/2024    3:25 PM 08/10/2024    4:09 PM  BP/Weight  Systolic BP 140 140   Diastolic BP 88 89   Wt. (Lbs) 237.5 239.8   BMI 40.77 kg/m2 41.16 kg/m2      Information is confidential and restricted. Go to Review Flowsheets to unlock data.    Physical Exam Vitals reviewed.  Constitutional:      Appearance: She is obese.  HENT:     Head: Normocephalic and atraumatic.     Nose: Nose normal.  Cardiovascular:     Rate and Rhythm: Normal rate and regular rhythm.  Pulmonary:     Effort: Pulmonary effort is normal.     Breath sounds: Normal breath sounds.  Neurological:     General: No focal deficit present.     Mental Status: She is alert.  Psychiatric:        Mood and Affect: Mood normal.         Lab Results  Component Value Date   WBC 5.3 02/11/2024   HGB 11.6 02/11/2024   HCT 36.6 02/11/2024   PLT 210 02/11/2024   GLUCOSE 82 02/11/2024  CHOL 196 02/11/2024   TRIG 109 02/11/2024   HDL 46 02/11/2024   LDLCALC 130 (H) 02/11/2024   ALT 11 02/11/2024   AST 17 02/11/2024   NA 140 02/11/2024   K 3.9 02/11/2024   CL 104 02/11/2024   CREATININE 1.06 (H) 02/11/2024   BUN 13 02/11/2024   CO2 21 02/11/2024   TSH 0.450 08/16/2022   INR 1.1 12/11/2023   HGBA1C 5.5 07/14/2021    Results for orders placed or performed in visit on 05/20/24  Drug Tox Alc Metab w/Con, Oral Fld   Collection Time: 05/20/24  1:59 PM  Result Value Ref Range   Alcohol Metabolite NEGATIVE <25 ng/mL  Drug Tox Monitor 1 w/Conf, Oral Fld   Collection Time: 05/20/24  1:59 PM  Result Value Ref Range   Amphetamines NEGATIVE <10 ng/mL   Barbiturates NEGATIVE <10  ng/mL   Benzodiazepines NEGATIVE <0.50 ng/mL   Buprenorphine  NEGATIVE <0.10 ng/mL   Cocaine NEGATIVE <5.0 ng/mL   Fentanyl  NEGATIVE <0.10 ng/mL   Heroin Metabolite NEGATIVE <1.0 ng/mL   MARIJUANA NEGATIVE <2.5 ng/mL   MDMA NEGATIVE <10 ng/mL   Meprobamate NEGATIVE <2.5 ng/mL   Methadone NEGATIVE <5.0 ng/mL   Nicotine Metabolite NEGATIVE <5.0 ng/mL   Opiates POSITIVE (A) <2.5 ng/mL   Codeine Negative <2.5 ng/mL   Dihydrocodeine Negative <2.5 ng/mL   Hydrocodone  Negative <2.5 ng/mL   Hydromorphone  Negative <2.5 ng/mL   Morphine  Negative <2.5 ng/mL   Norhydrocodone Negative <2.5 ng/mL   Noroxycodone 44.0 (H) <2.5 ng/mL   Oxycodone  197.0 (H) <2.5 ng/mL   Oxymorphone Negative <2.5 ng/mL   Phencyclidine NEGATIVE <10 ng/mL   Tapentadol NEGATIVE <5.0 ng/mL   Tramadol  NEGATIVE <5.0 ng/mL   Zolpidem NEGATIVE <5.0 ng/mL  .  Assessment & Plan:   Assessment & Plan Dyslipidemia Has elevated lipids, but does not want to be on a statin medication. She is on Zetia .  Will recheck lipids.  Orders:   CBC with Differential   Comprehensive metabolic panel with GFR   Lipid Panel  Essential hypertension Essential hypertension Blood pressure was elevated during the visit. She is on metoprolol  50 mg daily. - Refilled metoprolol  50 mg daily with a 90-day supply and a refill. - Rechecked blood pressure before lab work. - Monitor blood pressure at home and report readings over the next week. Orders:   CBC with Differential  Heat intolerance Wonders if it is her thyroid . Experiencing heat intolerance, night sweats, and dry skin, which could be related to menopause. Currently on Veozah , which is helping. - Continue veozah  for menopausal symptoms and will check her thyroid  function.  Orders:   TSH  Class 3 severe obesity due to excess calories with serious comorbidity and body mass index (BMI) of 40.0 to 44.9 in adult [facility]) Obesity with comorbidities of hyperlipidemia, hypertension She is  interested in weight loss and is considering Wegovy , which costs $200 per month out of pocket. Discussed potential interactions with current medications and the need for careful monitoring. - Prescribed Wegovy  TABLET once daily on an empty stomach. - Advised to avoid eating or drinking for one hour after taking Wegovy . - Encouraged regular physical activity and healthy eating habits.      Fibromyalgia Increased joint pain likely exacerbated by weather changes.  Migraine Migraines occur every three to four months. Emgality  is used for prevention, but insurance does not cover it, resulting in a $2000 cost per month. - Continue Emgality  once monthly for migraine prevention.  General health maintenance  Thyroid  function needs evaluation due to symptoms of heat intolerance, dry skin, and brittle hair. No recent thyroid  panel has been done. - Ordered thyroid  function tests.        Body mass index is 40.77 kg/m.     Meds ordered this encounter  Medications   metoprolol  succinate (TOPROL  XL) 50 MG 24 hr tablet    Sig: Take 1 tablet (50 mg total) by mouth at bedtime.    Dispense:  90 tablet    Refill:  1    Pt must schedule a followup appt with Cardiology, [phone], or PCP for any more refills. 1st attempt Thank You   semaglutide -weight management (WEGOVY ) 1.5 MG tablet    Sig: Take 1 tablet (1.5 mg total) by mouth daily. Daily in AM on an empty stomach with 4 oz of water. Do not eat or drink for 30 minutes after dose.    Dispense:  30 tablet    Refill:  2    Okay to use Good Rx coupon    Orders Placed This Encounter  Procedures   CBC with Differential   Comprehensive metabolic panel with GFR   Lipid Panel   TSH       Follow-up: No follow-ups on file.  An After Visit Summary was printed and given to the patient.  Margorie Renner, MD [facility] [phone]     [1]  Current Outpatient Medications on File Prior to Visit  Medication Sig Dispense Refill    acetaminophen  (TYLENOL ) 325 MG tablet Take 650 mg by mouth every 6 (six) hours as needed for mild pain, fever or headache.     aspirin  81 MG EC tablet Take 1 tablet (81 mg total) by mouth daily. 21 tablet 0   Calcium -Vitamin D -Vitamin K 650-12.5-40 MG-MCG-MCG CHEW Chew 1 tablet by mouth daily.     celecoxib  (CELEBREX ) 100 MG capsule Take 1 capsule twice a day by oral route with meal for inflammation/pain for the next 30 days 60 capsule 2   celecoxib  (CELEBREX ) 200 MG capsule Take 1 capsule (200 mg total) by mouth 2 (two) times daily. 60 capsule 0   cholecalciferol (VITAMIN D3) 25 MCG (1000 UNIT) tablet Take 1,000 Units by mouth daily.     clopidogrel  (PLAVIX ) 75 MG tablet Take 1 tablet (75 mg total) by mouth once daily 90 tablet 0   diclofenac  (VOLTAREN ) 75 MG EC tablet Take 1 tablet (75 mg total) by mouth 2 (two) times daily as needed for moderate pain (pain score 4-6). 60 tablet 0   docusate sodium  (COLACE) 100 MG capsule Take 1 capsule (100 mg total) by mouth 2 (two) times a day. Stool softener for post op constipation 30 capsule 0   ezetimibe  (ZETIA ) 10 MG tablet Take 1 tablet (10 mg total) by mouth daily. 90 tablet 1   Fezolinetant  (VEOZAH ) 45 MG TABS Take 1 tablet (45 mg total) by mouth daily. 90 tablet 0   FLUoxetine  (PROZAC ) 20 MG capsule Take 1 capsule (20 mg total) by mouth every evening. 90 capsule 0   furosemide  (LASIX ) 20 MG tablet Take 1 tablet by mouth daily as needed. 90 tablet 3   Galcanezumab -gnlm (EMGALITY ) 120 MG/ML SOAJ Inject 120 mg into the skin every 30 (thirty) days. 3 mL 2   lamoTRIgine  (LAMICTAL ) 25 MG tablet Take 2 tablets (50 mg total) by mouth daily. 180 tablet 0   methocarbamol  (ROBAXIN ) 500 MG tablet Take 1-2 tablets (500-1,000 mg total) by mouth 4 (four) times daily. This  is a muscle relaxer which will help you control pain after surgery 60 tablet 0   methocarbamol  (ROBAXIN ) 500 MG tablet Take 1 tablet twice a day by oral route after meal for muscle spasms for 30  days 60 tablet 2   methylPREDNISolone  (MEDROL  DOSEPAK) 4 MG TBPK tablet Take as directed on foil pack 21 tablet 0   Multiple Vitamin (MULTIVITAMIN WITH MINERALS) TABS tablet Take 1 tablet by mouth daily.     nabumetone  (RELAFEN ) 750 MG tablet Take 1 tablet (750 mg total) by mouth 2 (two) times daily. 60 tablet 1   OLANZapine  (ZYPREXA ) 5 MG tablet Take 1 tablet (5 mg total) by mouth every evening. 90 tablet 0   Omega-3 Fatty Acids (OMEGA-3 CF PO) Take 1 capsule by mouth daily at 12 noon.     ondansetron  (ZOFRAN ) 4 MG tablet Take 1 tablet (4 mg total) by mouth every 8 (eight) hours as needed for nausea or vomiting. 20 tablet 2   Oxycodone  HCl 10 MG TABS Take 1 tablet (10 mg total) by mouth 5 (five) times daily as needed. 150 tablet 0   pantoprazole  (PROTONIX ) 40 MG tablet Take 1 tablet by mouth daily. 90 tablet 0   pregabalin  (LYRICA ) 150 MG capsule Take 2 capsules (300 mg total) by mouth at bedtime. 180 capsule 0   tiZANidine  (ZANAFLEX ) 4 MG tablet Take 1 tablet (4 mg total) by mouth every 12 (twelve) hours as needed for muscle spasms. 180 tablet 0   Ubrogepant  (UBRELVY ) 100 MG TABS TAKE  1/2 TABLET BY MOUTH AT HEADACHE ONSET. MAY REPEAT DOSE IN TWO HOURS IF NO IMPROVEMENT. DO NOT EXCEED MORE THAN TWO TABLETS IN 24 HOURS. 18 tablet 1   valACYclovir  (VALTREX ) 500 MG tablet Take 1 tablet (500 mg total) by mouth 2 (two) times daily for 5 days 10 tablet 1   vortioxetine  HBr (TRINTELLIX ) 20 MG TABS tablet Take 1 tablet (20 mg total) by mouth daily. 90 tablet 0   buprenorphine  (BUTRANS ) 5 MCG/HR PTWK Place 1 patch onto the skin once a week. 4 patch 0   No current facility-administered medications on file prior to visit.   "

## 2024-08-25 NOTE — Assessment & Plan Note (Signed)
 Wonders if it is her thyroid . Experiencing heat intolerance, night sweats, and dry skin, which could be related to menopause. Currently on Veozah , which is helping. - Continue veozah  for menopausal symptoms and will check her thyroid  function.  Orders:   TSH

## 2024-08-25 NOTE — Telephone Encounter (Signed)
 PDMP was Reviewed.  Note reviewed. Call placed to Ms. Baine, she verbalizes understanding.  [place]  e-scribed to pharmacy, she verbalizes understanding.

## 2024-08-25 NOTE — Assessment & Plan Note (Signed)
 Has elevated lipids, but does not want to be on a statin medication. She is on Zetia .  Will recheck lipids.  Orders:   CBC with Differential   Comprehensive metabolic panel with GFR   Lipid Panel

## 2024-08-25 NOTE — Assessment & Plan Note (Signed)
 Obesity with comorbidities of hyperlipidemia, hypertension She is interested in weight loss and is considering Wegovy , which costs $200 per month out of pocket. Discussed potential interactions with current medications and the need for careful monitoring. - Prescribed Wegovy  TABLET once daily on an empty stomach. - Advised to avoid eating or drinking for one hour after taking Wegovy . - Encouraged regular physical activity and healthy eating habits.

## 2024-08-26 LAB — COMPREHENSIVE METABOLIC PANEL WITH GFR
ALT: 15 [IU]/L (ref 0–32)
AST: 20 [IU]/L (ref 0–40)
Albumin: 4.5 g/dL (ref 3.8–4.9)
Alkaline Phosphatase: 65 [IU]/L (ref 49–135)
BUN/Creatinine Ratio: 11 (ref 9–23)
BUN: 10 mg/dL (ref 6–24)
Bilirubin Total: 0.2 mg/dL (ref 0.0–1.2)
CO2: 26 mmol/L (ref 20–29)
Calcium: 9.8 mg/dL (ref 8.7–10.2)
Chloride: 101 mmol/L (ref 96–106)
Creatinine, Ser: 0.92 mg/dL (ref 0.57–1.00)
Globulin, Total: 2.8 g/dL (ref 1.5–4.5)
Glucose: 101 mg/dL — ABNORMAL HIGH (ref 70–99)
Potassium: 4.1 mmol/L (ref 3.5–5.2)
Sodium: 142 mmol/L (ref 134–144)
Total Protein: 7.3 g/dL (ref 6.0–8.5)
eGFR: 75 mL/min/{1.73_m2}

## 2024-08-26 LAB — CBC WITH DIFFERENTIAL/PLATELET
Basophils Absolute: 0 10*3/uL (ref 0.0–0.2)
Basos: 0 %
EOS (ABSOLUTE): 0.1 10*3/uL (ref 0.0–0.4)
Eos: 2 %
Hematocrit: 40.9 % (ref 34.0–46.6)
Hemoglobin: 12.8 g/dL (ref 11.1–15.9)
Immature Grans (Abs): 0 10*3/uL (ref 0.0–0.1)
Immature Granulocytes: 0 %
Lymphocytes Absolute: 2.3 10*3/uL (ref 0.7–3.1)
Lymphs: 39 %
MCH: 27.9 pg (ref 26.6–33.0)
MCHC: 31.3 g/dL — ABNORMAL LOW (ref 31.5–35.7)
MCV: 89 fL (ref 79–97)
Monocytes Absolute: 0.4 10*3/uL (ref 0.1–0.9)
Monocytes: 6 %
Neutrophils Absolute: 3.2 10*3/uL (ref 1.4–7.0)
Neutrophils: 53 %
Platelets: 200 10*3/uL (ref 150–450)
RBC: 4.58 x10E6/uL (ref 3.77–5.28)
RDW: 13.6 % (ref 11.7–15.4)
WBC: 6 10*3/uL (ref 3.4–10.8)

## 2024-08-26 LAB — LIPID PANEL
Chol/HDL Ratio: 4.1 ratio (ref 0.0–4.4)
Cholesterol, Total: 210 mg/dL — ABNORMAL HIGH (ref 100–199)
HDL: 51 mg/dL
LDL Chol Calc (NIH): 129 mg/dL — ABNORMAL HIGH (ref 0–99)
Triglycerides: 168 mg/dL — ABNORMAL HIGH (ref 0–149)
VLDL Cholesterol Cal: 30 mg/dL (ref 5–40)

## 2024-08-26 LAB — TSH: TSH: 0.538 u[IU]/mL (ref 0.450–4.500)

## 2024-08-27 ENCOUNTER — Ambulatory Visit: Payer: Self-pay

## 2024-09-01 ENCOUNTER — Other Ambulatory Visit: Payer: Self-pay

## 2024-09-01 ENCOUNTER — Other Ambulatory Visit (HOSPITAL_COMMUNITY): Payer: Self-pay

## 2024-09-01 ENCOUNTER — Telehealth: Payer: Self-pay | Admitting: Neurology

## 2024-09-01 NOTE — Telephone Encounter (Signed)
 Pt called to request to speak to Nurse Pt stated her headaches will not go away  and medication is not working at all

## 2024-09-02 ENCOUNTER — Other Ambulatory Visit (HOSPITAL_COMMUNITY): Payer: Self-pay

## 2024-09-03 ENCOUNTER — Other Ambulatory Visit (HOSPITAL_COMMUNITY): Payer: Self-pay

## 2024-09-03 ENCOUNTER — Other Ambulatory Visit: Payer: Self-pay

## 2024-09-03 ENCOUNTER — Other Ambulatory Visit: Payer: Self-pay | Admitting: Neurology

## 2024-09-03 MED ORDER — ZONISAMIDE 100 MG PO CAPS
100.0000 mg | ORAL_CAPSULE | Freq: Every day | ORAL | 5 refills | Status: DC
  Filled 2024-09-03: qty 30, 30d supply, fill #0

## 2024-09-03 MED ORDER — PROMETHAZINE HCL 25 MG PO TABS
25.0000 mg | ORAL_TABLET | Freq: Three times a day (TID) | ORAL | 0 refills | Status: AC | PRN
  Filled 2024-09-03: qty 30, 10d supply, fill #0

## 2024-09-03 NOTE — Telephone Encounter (Signed)
 I called pt and she is having more migraines and the emgality  and ubrelvy  not working as well.  Ptp did see her pcp and renewed her medications.  Now having 1-2 migraines per week.  I told her will check with provider to see could see NP and let her know.

## 2024-09-03 NOTE — Telephone Encounter (Signed)
 Patient pick up the sample of nurtec (1 box).

## 2024-09-04 ENCOUNTER — Other Ambulatory Visit: Payer: Self-pay

## 2024-09-04 ENCOUNTER — Other Ambulatory Visit: Payer: Self-pay | Admitting: Neurology

## 2024-09-04 ENCOUNTER — Encounter (HOSPITAL_COMMUNITY): Payer: Self-pay

## 2024-09-04 ENCOUNTER — Other Ambulatory Visit (HOSPITAL_COMMUNITY): Payer: Self-pay

## 2024-09-04 MED ORDER — ZONISAMIDE 100 MG PO CAPS
100.0000 mg | ORAL_CAPSULE | Freq: Every day | ORAL | 3 refills | Status: AC
  Filled 2024-09-04: qty 90, 90d supply, fill #0

## 2024-09-14 ENCOUNTER — Encounter: Admitting: Registered Nurse

## 2024-09-21 ENCOUNTER — Ambulatory Visit: Admitting: Physician Assistant

## 2024-10-12 ENCOUNTER — Encounter: Admitting: Registered Nurse

## 2024-11-09 ENCOUNTER — Telehealth (HOSPITAL_COMMUNITY): Admitting: Psychiatry

## 2024-11-30 ENCOUNTER — Ambulatory Visit
